# Patient Record
Sex: Female | Born: 1945 | Race: White | Hispanic: No | Marital: Married | Smoking: Never smoker
Health system: Southern US, Community
[De-identification: ages and names within clinical notes are randomized; demographics above are authoritative.]

## PROBLEM LIST (undated history)

## (undated) DIAGNOSIS — M1990 Unspecified osteoarthritis, unspecified site: Secondary | ICD-10-CM

## (undated) DIAGNOSIS — I10 Essential (primary) hypertension: Secondary | ICD-10-CM

## (undated) DIAGNOSIS — E039 Hypothyroidism, unspecified: Secondary | ICD-10-CM

## (undated) DIAGNOSIS — G4733 Obstructive sleep apnea (adult) (pediatric): Secondary | ICD-10-CM

## (undated) DIAGNOSIS — Z8669 Personal history of other diseases of the nervous system and sense organs: Secondary | ICD-10-CM

## (undated) DIAGNOSIS — J40 Bronchitis, not specified as acute or chronic: Secondary | ICD-10-CM

## (undated) DIAGNOSIS — R51 Headache: Secondary | ICD-10-CM

## (undated) DIAGNOSIS — I4891 Unspecified atrial fibrillation: Secondary | ICD-10-CM

## (undated) DIAGNOSIS — Z9889 Other specified postprocedural states: Secondary | ICD-10-CM

## (undated) DIAGNOSIS — I447 Left bundle-branch block, unspecified: Secondary | ICD-10-CM

## (undated) DIAGNOSIS — R112 Nausea with vomiting, unspecified: Secondary | ICD-10-CM

## (undated) HISTORY — PX: EYE SURGERY: SHX253

## (undated) HISTORY — DX: Obstructive sleep apnea (adult) (pediatric): G47.33

## (undated) HISTORY — PX: BACK SURGERY: SHX140

## (undated) HISTORY — PX: LUMBAR DISC SURGERY: SHX700

## (undated) HISTORY — PX: POSTERIOR FUSION LUMBAR SPINE: SUR632

## (undated) HISTORY — PX: CATARACT EXTRACTION, BILATERAL: SHX1313

## (undated) HISTORY — PX: APPENDECTOMY: SHX54

---

## 1999-04-10 ENCOUNTER — Other Ambulatory Visit: Admission: RE | Admit: 1999-04-10 | Discharge: 1999-04-10 | Payer: Self-pay | Admitting: Internal Medicine

## 1999-10-23 ENCOUNTER — Encounter: Admission: RE | Admit: 1999-10-23 | Discharge: 1999-10-23 | Payer: Self-pay | Admitting: Internal Medicine

## 1999-10-23 ENCOUNTER — Encounter: Payer: Self-pay | Admitting: Internal Medicine

## 2000-04-28 ENCOUNTER — Encounter: Payer: Self-pay | Admitting: Internal Medicine

## 2000-04-28 ENCOUNTER — Encounter: Admission: RE | Admit: 2000-04-28 | Discharge: 2000-04-28 | Payer: Self-pay | Admitting: Internal Medicine

## 2000-10-25 ENCOUNTER — Encounter: Admission: RE | Admit: 2000-10-25 | Discharge: 2000-10-25 | Payer: Self-pay | Admitting: Internal Medicine

## 2000-10-25 ENCOUNTER — Encounter: Payer: Self-pay | Admitting: Internal Medicine

## 2001-02-04 ENCOUNTER — Other Ambulatory Visit: Admission: RE | Admit: 2001-02-04 | Discharge: 2001-02-04 | Payer: Self-pay | Admitting: Gynecology

## 2001-02-04 ENCOUNTER — Encounter (INDEPENDENT_AMBULATORY_CARE_PROVIDER_SITE_OTHER): Payer: Self-pay

## 2001-03-28 HISTORY — PX: ABDOMINAL HYSTERECTOMY: SHX81

## 2001-04-20 ENCOUNTER — Encounter (INDEPENDENT_AMBULATORY_CARE_PROVIDER_SITE_OTHER): Payer: Self-pay | Admitting: Specialist

## 2001-04-20 ENCOUNTER — Inpatient Hospital Stay (HOSPITAL_COMMUNITY): Admission: RE | Admit: 2001-04-20 | Discharge: 2001-04-21 | Payer: Self-pay | Admitting: Gynecology

## 2001-10-27 ENCOUNTER — Encounter: Payer: Self-pay | Admitting: Internal Medicine

## 2001-10-27 ENCOUNTER — Encounter: Admission: RE | Admit: 2001-10-27 | Discharge: 2001-10-27 | Payer: Self-pay | Admitting: Internal Medicine

## 2002-05-25 ENCOUNTER — Other Ambulatory Visit: Admission: RE | Admit: 2002-05-25 | Discharge: 2002-05-25 | Payer: Self-pay | Admitting: Gynecology

## 2002-06-26 ENCOUNTER — Ambulatory Visit (HOSPITAL_COMMUNITY): Admission: RE | Admit: 2002-06-26 | Discharge: 2002-06-26 | Payer: Self-pay | Admitting: Gastroenterology

## 2003-05-24 ENCOUNTER — Other Ambulatory Visit: Admission: RE | Admit: 2003-05-24 | Discharge: 2003-05-24 | Payer: Self-pay | Admitting: Gynecology

## 2003-08-09 ENCOUNTER — Encounter: Payer: Self-pay | Admitting: Internal Medicine

## 2003-08-09 ENCOUNTER — Encounter: Admission: RE | Admit: 2003-08-09 | Discharge: 2003-08-09 | Payer: Self-pay | Admitting: Internal Medicine

## 2003-08-29 HISTORY — PX: CERVICAL FUSION: SHX112

## 2003-09-19 ENCOUNTER — Inpatient Hospital Stay (HOSPITAL_COMMUNITY): Admission: RE | Admit: 2003-09-19 | Discharge: 2003-09-21 | Payer: Self-pay | Admitting: Neurosurgery

## 2003-09-19 ENCOUNTER — Encounter: Payer: Self-pay | Admitting: Neurosurgery

## 2004-02-07 ENCOUNTER — Other Ambulatory Visit: Admission: RE | Admit: 2004-02-07 | Discharge: 2004-02-07 | Payer: Self-pay | Admitting: Gynecology

## 2004-07-30 ENCOUNTER — Inpatient Hospital Stay (HOSPITAL_COMMUNITY): Admission: RE | Admit: 2004-07-30 | Discharge: 2004-08-02 | Payer: Self-pay | Admitting: Neurosurgery

## 2004-09-04 ENCOUNTER — Encounter: Admission: RE | Admit: 2004-09-04 | Discharge: 2004-09-04 | Payer: Self-pay | Admitting: Internal Medicine

## 2004-10-29 ENCOUNTER — Encounter: Admission: RE | Admit: 2004-10-29 | Discharge: 2004-11-26 | Payer: Self-pay | Admitting: Neurosurgery

## 2005-02-02 ENCOUNTER — Other Ambulatory Visit: Admission: RE | Admit: 2005-02-02 | Discharge: 2005-02-02 | Payer: Self-pay | Admitting: Gynecology

## 2005-09-07 ENCOUNTER — Encounter: Admission: RE | Admit: 2005-09-07 | Discharge: 2005-09-07 | Payer: Self-pay | Admitting: Internal Medicine

## 2005-11-11 ENCOUNTER — Ambulatory Visit (HOSPITAL_COMMUNITY): Admission: RE | Admit: 2005-11-11 | Discharge: 2005-11-11 | Payer: Self-pay | Admitting: Neurosurgery

## 2006-01-01 ENCOUNTER — Inpatient Hospital Stay (HOSPITAL_COMMUNITY): Admission: RE | Admit: 2006-01-01 | Discharge: 2006-01-04 | Payer: Self-pay | Admitting: Neurosurgery

## 2006-02-11 ENCOUNTER — Other Ambulatory Visit: Admission: RE | Admit: 2006-02-11 | Discharge: 2006-02-11 | Payer: Self-pay | Admitting: Gynecology

## 2006-09-15 ENCOUNTER — Encounter: Admission: RE | Admit: 2006-09-15 | Discharge: 2006-09-15 | Payer: Self-pay | Admitting: Internal Medicine

## 2006-11-08 ENCOUNTER — Encounter: Admission: RE | Admit: 2006-11-08 | Discharge: 2006-11-08 | Payer: Self-pay | Admitting: Internal Medicine

## 2007-03-24 ENCOUNTER — Other Ambulatory Visit: Admission: RE | Admit: 2007-03-24 | Discharge: 2007-03-24 | Payer: Self-pay | Admitting: Gynecology

## 2007-12-13 ENCOUNTER — Encounter: Admission: RE | Admit: 2007-12-13 | Discharge: 2007-12-13 | Payer: Self-pay | Admitting: Internal Medicine

## 2008-05-10 ENCOUNTER — Other Ambulatory Visit: Admission: RE | Admit: 2008-05-10 | Discharge: 2008-05-10 | Payer: Self-pay | Admitting: Gynecology

## 2008-12-13 ENCOUNTER — Encounter: Admission: RE | Admit: 2008-12-13 | Discharge: 2008-12-13 | Payer: Self-pay | Admitting: Internal Medicine

## 2009-08-13 ENCOUNTER — Encounter: Admission: RE | Admit: 2009-08-13 | Discharge: 2009-08-13 | Payer: Self-pay | Admitting: Internal Medicine

## 2009-12-16 ENCOUNTER — Encounter: Admission: RE | Admit: 2009-12-16 | Discharge: 2009-12-16 | Payer: Self-pay | Admitting: Internal Medicine

## 2010-03-28 HISTORY — PX: CHOLECYSTECTOMY: SHX55

## 2010-04-01 ENCOUNTER — Inpatient Hospital Stay (HOSPITAL_COMMUNITY): Admission: EM | Admit: 2010-04-01 | Discharge: 2010-04-05 | Payer: Self-pay | Admitting: Emergency Medicine

## 2010-04-01 ENCOUNTER — Emergency Department (HOSPITAL_COMMUNITY): Admission: EM | Admit: 2010-04-01 | Discharge: 2010-04-01 | Payer: Self-pay | Admitting: Family Medicine

## 2010-04-04 ENCOUNTER — Encounter (INDEPENDENT_AMBULATORY_CARE_PROVIDER_SITE_OTHER): Payer: Self-pay | Admitting: Internal Medicine

## 2010-04-15 ENCOUNTER — Encounter: Admission: RE | Admit: 2010-04-15 | Discharge: 2010-04-15 | Payer: Self-pay | Admitting: Surgery

## 2010-05-07 ENCOUNTER — Ambulatory Visit (HOSPITAL_COMMUNITY): Admission: RE | Admit: 2010-05-07 | Discharge: 2010-05-07 | Payer: Self-pay | Admitting: Gastroenterology

## 2010-07-14 ENCOUNTER — Encounter: Admission: RE | Admit: 2010-07-14 | Discharge: 2010-07-14 | Payer: Self-pay | Admitting: Gastroenterology

## 2010-09-15 ENCOUNTER — Encounter: Admission: RE | Admit: 2010-09-15 | Discharge: 2010-09-15 | Payer: Self-pay | Admitting: Neurosurgery

## 2010-09-26 ENCOUNTER — Ambulatory Visit (HOSPITAL_COMMUNITY): Admission: RE | Admit: 2010-09-26 | Discharge: 2010-09-27 | Payer: Self-pay | Admitting: Neurosurgery

## 2010-10-28 HISTORY — PX: LUMBAR LAMINECTOMY/DECOMPRESSION MICRODISCECTOMY: SHX5026

## 2010-10-31 ENCOUNTER — Encounter: Admission: RE | Admit: 2010-10-31 | Discharge: 2010-10-31 | Payer: Self-pay | Admitting: Neurosurgery

## 2010-12-16 ENCOUNTER — Encounter
Admission: RE | Admit: 2010-12-16 | Discharge: 2010-12-16 | Payer: Self-pay | Source: Home / Self Care | Attending: Internal Medicine | Admitting: Internal Medicine

## 2011-01-19 ENCOUNTER — Encounter: Payer: Self-pay | Admitting: Neurosurgery

## 2011-03-11 LAB — GLUCOSE, CAPILLARY: Glucose-Capillary: 128 mg/dL — ABNORMAL HIGH (ref 70–99)

## 2011-03-12 LAB — BASIC METABOLIC PANEL
CO2: 31 mEq/L (ref 19–32)
Calcium: 9.5 mg/dL (ref 8.4–10.5)
Creatinine, Ser: 0.83 mg/dL (ref 0.4–1.2)
Glucose, Bld: 104 mg/dL — ABNORMAL HIGH (ref 70–99)

## 2011-03-12 LAB — GLUCOSE, CAPILLARY: Glucose-Capillary: 99 mg/dL (ref 70–99)

## 2011-03-12 LAB — URINALYSIS, ROUTINE W REFLEX MICROSCOPIC
Glucose, UA: NEGATIVE mg/dL
Ketones, ur: NEGATIVE mg/dL
Protein, ur: NEGATIVE mg/dL

## 2011-03-12 LAB — CBC
Hemoglobin: 13.6 g/dL (ref 12.0–15.0)
MCH: 29.2 pg (ref 26.0–34.0)
MCHC: 33.9 g/dL (ref 30.0–36.0)
Platelets: 205 10*3/uL (ref 150–400)

## 2011-03-12 LAB — URINE MICROSCOPIC-ADD ON

## 2011-03-18 LAB — URINALYSIS, ROUTINE W REFLEX MICROSCOPIC
Bilirubin Urine: NEGATIVE
Hgb urine dipstick: NEGATIVE
Ketones, ur: 15 mg/dL — AB
Protein, ur: 30 mg/dL — AB
Urobilinogen, UA: 1 mg/dL (ref 0.0–1.0)

## 2011-03-18 LAB — COMPREHENSIVE METABOLIC PANEL
ALT: 189 U/L — ABNORMAL HIGH (ref 0–35)
ALT: 282 U/L — ABNORMAL HIGH (ref 0–35)
ALT: 56 U/L — ABNORMAL HIGH (ref 0–35)
ALT: 98 U/L — ABNORMAL HIGH (ref 0–35)
AST: 31 U/L (ref 0–37)
AST: 37 U/L (ref 0–37)
AST: 79 U/L — ABNORMAL HIGH (ref 0–37)
Albumin: 2.5 g/dL — ABNORMAL LOW (ref 3.5–5.2)
Albumin: 2.5 g/dL — ABNORMAL LOW (ref 3.5–5.2)
Albumin: 2.7 g/dL — ABNORMAL LOW (ref 3.5–5.2)
Albumin: 3 g/dL — ABNORMAL LOW (ref 3.5–5.2)
Alkaline Phosphatase: 66 U/L (ref 39–117)
Alkaline Phosphatase: 68 U/L (ref 39–117)
BUN: 10 mg/dL (ref 6–23)
BUN: 11 mg/dL (ref 6–23)
CO2: 25 mEq/L (ref 19–32)
CO2: 25 mEq/L (ref 19–32)
CO2: 25 mEq/L (ref 19–32)
CO2: 29 mEq/L (ref 19–32)
Calcium: 8.2 mg/dL — ABNORMAL LOW (ref 8.4–10.5)
Calcium: 8.6 mg/dL (ref 8.4–10.5)
Chloride: 104 mEq/L (ref 96–112)
Chloride: 96 mEq/L (ref 96–112)
Chloride: 98 mEq/L (ref 96–112)
Chloride: 99 mEq/L (ref 96–112)
Creatinine, Ser: 0.8 mg/dL (ref 0.4–1.2)
Creatinine, Ser: 0.88 mg/dL (ref 0.4–1.2)
Creatinine, Ser: 0.89 mg/dL (ref 0.4–1.2)
Creatinine, Ser: 0.98 mg/dL (ref 0.4–1.2)
Creatinine, Ser: 0.98 mg/dL (ref 0.4–1.2)
GFR calc Af Amer: >60 mL/min (ref >60)
GFR calc Af Amer: >60 mL/min (ref >60)
GFR calc non Af Amer: 57 mL/min — ABNORMAL LOW (ref >60)
GFR calc non Af Amer: 57 mL/min — ABNORMAL LOW (ref >60)
GFR calc non Af Amer: >60 mL/min (ref >60)
Glucose, Bld: 154 mg/dL — ABNORMAL HIGH (ref 70–99)
Glucose, Bld: 332 mg/dL — ABNORMAL HIGH (ref 70–99)
Potassium: 3.5 mEq/L (ref 3.5–5.1)
Potassium: 3.9 mEq/L (ref 3.5–5.1)
Sodium: 133 mEq/L — ABNORMAL LOW (ref 135–145)
Sodium: 133 mEq/L — ABNORMAL LOW (ref 135–145)
Sodium: 136 mEq/L (ref 135–145)
Total Bilirubin: 1.6 mg/dL — ABNORMAL HIGH (ref 0.3–1.2)
Total Bilirubin: 1.6 mg/dL — ABNORMAL HIGH (ref 0.3–1.2)
Total Bilirubin: 1.6 mg/dL — ABNORMAL HIGH (ref 0.3–1.2)
Total Bilirubin: 2 mg/dL — ABNORMAL HIGH (ref 0.3–1.2)
Total Protein: 5.8 g/dL — ABNORMAL LOW (ref 6.0–8.3)
Total Protein: 6.1 g/dL (ref 6.0–8.3)

## 2011-03-18 LAB — CBC
HCT: 32.3 % — ABNORMAL LOW (ref 36.0–46.0)
HCT: 35.6 % — ABNORMAL LOW (ref 36.0–46.0)
Hemoglobin: 14.9 g/dL (ref 12.0–15.0)
MCHC: 33.9 g/dL (ref 30.0–36.0)
MCHC: 34 g/dL (ref 30.0–36.0)
MCHC: 34.3 g/dL (ref 30.0–36.0)
MCV: 90.5 fL (ref 78.0–100.0)
MCV: 91.4 fL (ref 78.0–100.0)
MCV: 92.4 fL (ref 78.0–100.0)
Platelets: 185 10*3/uL (ref 150–400)
Platelets: 192 10*3/uL (ref 150–400)
Platelets: 218 10*3/uL (ref 150–400)
RBC: 4.23 MIL/uL (ref 3.87–5.11)
RBC: 4.8 MIL/uL (ref 3.87–5.11)
RDW: 13.4 % (ref 11.5–15.5)
RDW: 13.5 % (ref 11.5–15.5)
WBC: 12.4 10*3/uL — ABNORMAL HIGH (ref 4.0–10.5)
WBC: 15.7 10*3/uL — ABNORMAL HIGH (ref 4.0–10.5)

## 2011-03-18 LAB — DIFFERENTIAL
Basophils Absolute: 0 10*3/uL (ref 0.0–0.1)
Eosinophils Absolute: 0 10*3/uL (ref 0.0–0.7)
Lymphocytes Relative: 4 % — ABNORMAL LOW (ref 12–46)
Lymphs Abs: 0.6 10*3/uL — ABNORMAL LOW (ref 0.7–4.0)
Neutrophils Relative %: 93 % — ABNORMAL HIGH (ref 43–77)

## 2011-03-18 LAB — CULTURE, BLOOD (ROUTINE X 2)
Culture: NO GROWTH
Culture: NO GROWTH

## 2011-03-18 LAB — CARDIAC PANEL(CRET KIN+CKTOT+MB+TROPI)
CK, MB: 1.1 ng/mL (ref 0.3–4.0)
Troponin I: 0.01 ng/mL (ref 0.00–0.06)
Troponin I: 0.02 ng/mL (ref 0.00–0.06)

## 2011-03-18 LAB — POCT I-STAT, CHEM 8
Calcium, Ion: 1.12 mmol/L (ref 1.12–1.32)
Glucose, Bld: 381 mg/dL — ABNORMAL HIGH (ref 70–99)
HCT: 52 % — ABNORMAL HIGH (ref 36.0–46.0)
Hemoglobin: 17.7 g/dL — ABNORMAL HIGH (ref 12.0–15.0)
Potassium: 3.2 mEq/L — ABNORMAL LOW (ref 3.5–5.1)
TCO2: 26 mmol/L (ref 0–100)

## 2011-03-18 LAB — LIPID PANEL
HDL: 36 mg/dL — ABNORMAL LOW (ref >39)
Triglycerides: 99 mg/dL (ref <150)
VLDL: 20 mg/dL (ref 0–40)

## 2011-03-18 LAB — GLUCOSE, CAPILLARY
Glucose-Capillary: 101 mg/dL — ABNORMAL HIGH (ref 70–99)
Glucose-Capillary: 104 mg/dL — ABNORMAL HIGH (ref 70–99)
Glucose-Capillary: 122 mg/dL — ABNORMAL HIGH (ref 70–99)
Glucose-Capillary: 136 mg/dL — ABNORMAL HIGH (ref 70–99)
Glucose-Capillary: 153 mg/dL — ABNORMAL HIGH (ref 70–99)
Glucose-Capillary: 154 mg/dL — ABNORMAL HIGH (ref 70–99)
Glucose-Capillary: 156 mg/dL — ABNORMAL HIGH (ref 70–99)
Glucose-Capillary: 163 mg/dL — ABNORMAL HIGH (ref 70–99)
Glucose-Capillary: 166 mg/dL — ABNORMAL HIGH (ref 70–99)
Glucose-Capillary: 196 mg/dL — ABNORMAL HIGH (ref 70–99)
Glucose-Capillary: 208 mg/dL — ABNORMAL HIGH (ref 70–99)
Glucose-Capillary: 219 mg/dL — ABNORMAL HIGH (ref 70–99)

## 2011-03-18 LAB — CK TOTAL AND CKMB (NOT AT ARMC)
CK, MB: 1 ng/mL (ref 0.3–4.0)
Relative Index: INVALID (ref 0.0–2.5)
Relative Index: INVALID (ref 0.0–2.5)
Total CK: 20 U/L (ref 7–177)
Total CK: 27 U/L (ref 7–177)

## 2011-03-18 LAB — TROPONIN I
Troponin I: 0.02 ng/mL (ref 0.00–0.06)
Troponin I: 0.02 ng/mL (ref 0.00–0.06)

## 2011-03-18 LAB — PHOSPHORUS: Phosphorus: 2.7 mg/dL (ref 2.3–4.6)

## 2011-03-18 LAB — LIPASE, BLOOD: Lipase: 1028 U/L — ABNORMAL HIGH (ref 11–59)

## 2011-03-18 LAB — MAGNESIUM: Magnesium: 1.5 mg/dL (ref 1.5–2.5)

## 2011-03-18 LAB — TSH: TSH: 2.326 u[IU]/mL (ref 0.350–4.500)

## 2011-05-14 ENCOUNTER — Other Ambulatory Visit (HOSPITAL_COMMUNITY)
Admission: RE | Admit: 2011-05-14 | Discharge: 2011-05-14 | Disposition: A | Discharge status: Home / Self Care | Payer: Medicare Other | Source: Ambulatory Visit | Attending: Internal Medicine | Admitting: Internal Medicine

## 2011-05-14 DIAGNOSIS — Z124 Encounter for screening for malignant neoplasm of cervix: Secondary | ICD-10-CM | POA: Insufficient documentation

## 2011-05-15 NOTE — Op Note (Signed)
Destiny [name], Destiny [name]                        ACCOUNT NO.:  [id number]   MEDICAL RECORD NO.:  [id number]                   PATIENT TYPE:  INP   LOCATION:  2874                                 FACILITY:  [facility]   PHYSICIAN:  Coletta Memos, M.D.                  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   PROCEDURE:  1. Posterolateral arthrodesis at L4-5.  2. Pedicle screw fixation non-segmental at L4-L5 with four 45 x 6.5 mm     Legacy screws.  3. Posterior lumbar interbody arthrodesis.  4. Interbody cages by two 11 mm Synthes Peak cages.  5. Autologous autograft with Infuse for posterolateral arthrodesis.   COMPLICATIONS:  None.   SURGEON:  Coletta Memos, M.D.   ASSISTANT:  Hewitt Shorts, M.D.   INDICATIONS FOR PROCEDURE:  This is a patient of mine who started to have  back pain in the fall and early winter.  The pain was not so great that she  wanted to do anything about it, so we watched it conservatively.  The pain,  however, progressed in a steady but slow fashion to the point that she was  having unbearable pain. An MRI showed severe arthropathy present at the L4-5  joint leading to ligamentous hypertrophy, lumbar stenosis and foraminal  stenosis bilaterally and spondylolisthesis.  Secondary to the  spondylolisthesis and a severe facet arthropathy, I did not believe a simple  decompression would leave her stable, so I therefore also told her that she  would decompression in addition to the posterolateral devices and interbody  devices.   DESCRIPTION OF PROCEDURE:   The patient was brought to the operating room,  intubated and placed under a general anesthetic without difficulty.  She was  rolled prone onto body rolls and all pressure pointes were properly padded.  A Foley catheter had been placed under sterile conditions prior to final  positioning.  The patient's back was prepped and she was draped in a sterile  fashion. I infiltrated 40 mL of 1/2% lidocaine at 1:200,000 strength  epinephrine into the paraspinous musculature and the region of the incision  in the lumbar area.  Using a preoperative localizing film, I opened the skin  incision and exposed the lamina of L3, L4 and L5 along with the sacrum.  I  took two more x-rays to confirm my location at the L4-5 level.  I then did a  complete laminectomy of L4 and a diskectomy at L4-5 bilaterally.  I then  completed facetectomies of the inferior facet  of L4 and partial superior  facetectomies of L5 in order to make sure that the neural foramen was open  bilaterally.  When that was done, I then placed two cages after preparing  the interbody spaces at L4-5. The 11 mm cages were placed without  difficulty.  Then bringing in fluoroscopy, I placed pedicle screws with two  at L4, two at L5 with fluoroscopic guidance first by drilling a small entry  site using a pedicle probe and then a tap.  AP and lateral x-rays showed the  screws to be in good position.  I then used the morselized autograft,  wrapped and Infuse and I laid that over the decorticated  transverse processes of L4 and L5 bilaterally.  I then placed rods and  secured those into position.  I then placed more bone that was left over  over the Infuse pads. I then closed the wound in layered fashion using  Vicryl sutures.  Dermabond was used for a sterile dressing.  The patient  tolerated the procedure well.                                               Coletta Memos, M.D.    KC/MEDQ  D:  07/30/2004  T:  07/30/2004  Job:  [id number]

## 2011-05-15 NOTE — Discharge Summary (Signed)
NAMEMARYHELEN, Destiny [name]                        ACCOUNT NO.:  [id number]   MEDICAL RECORD NO.:  [id number]                   PATIENT TYPE:  INP   LOCATION:  3015                                 FACILITY:  [facility]   PHYSICIAN:  Coletta Memos, M.D.                  DATE OF BIRTH:  August 18, 1946   DATE OF ADMISSION:  07/30/2004  DATE OF DISCHARGE:  08/02/2004                                 DISCHARGE SUMMARY   ADMITTING DIAGNOSES:  Lumbar spondylosis L4-5 and lumbar spondylolisthesis  L4-5 secondary to facet arthropathy, lumbar stenosis L4-5 and lumbar  radiculopathy.   POSTOPERATIVE DIAGNOSES:  Lumbar spondylosis L4-5 and lumbar  spondylolisthesis L4-5 secondary to facet arthropathy, lumbar stenosis L4-5  and lumbar radiculopathy.   PROCEDURE:  Posterolateral arthrodesis at L4-5 with pedicle screw fixation  and posterolateral interbody fusion using Synthes PEEK cages.   COMPLICATIONS:  None.   DISCHARGE STATUS:  Alive and well.   INDICATIONS:  [name] is a 65 year old patient of mine whom I have  taken care of in the past for cervical disease.  She developed back pain  approximately nine months ago, but was able to hold off secondary to the  pain not being so severe.  However, an MRI that we performed after her pain  worsened showed severe facet arthropathy, spondylolisthesis along with  spinal stenosis at L4-5.  Both nerve roots appeared to be encroached upon  within the neuroforamen.  She therefore opted to undergo surgical  decompression, for which she was admitted on July 30, 2004.  She had her  procedure without any untoward events.  At the time of discharge her wound  is clean and dry.  There is a slight bit of bloody drainage from the  superior most portion of the wound, but that has subsided significantly  since the operation.  Her strength is full in the lower extremities.  Her  gait is normal.  She has had both PT and OT see her during her time in the  hospital.  She  was given prescriptions for Percocet 7.5/325 and for  Flexeril.  She knows to contact the office for a return appointment.  She  also knows not to drive for at least 10 days and to wear a brace when out of  bed.                                                Coletta Memos, M.D.    KC/MEDQ  D:  08/02/2004  T:  08/03/2004  Job:  [id number]

## 2011-05-15 NOTE — Op Note (Signed)
NAMEMARCIANA, [name] NO.:  [id number]   MEDICAL RECORD NO.:  [id number]          PATIENT TYPE:  INP   LOCATION:  3008                         FACILITY:  [facility]   PHYSICIAN:  Coletta Memos, M.D.     DATE OF BIRTH:  07/12/1946   DATE OF PROCEDURE:  01/01/2006  DATE OF DISCHARGE:                                 OPERATIVE REPORT   PREOPERATIVE DIAGNOSES:  1.  Lumbar spondylosis, L3-4.  2.  Facet arthropathy, L3-4.  3.  Lumbar stenosis L3-4.  4.  Instability, L3-4.   POSTOPERATIVE DIAGNOSES:  1.  Lumbar spondylosis, L3-4.  2.  Facet arthropathy, L3-4.  3.  Lumbar stenosis L3-4.  4.  Instability, L3-4.   PROCEDURE:  1.  Posterior lumbar interbody fusion with 13-mm Synthes PEEK cages packed      with morselized autograft, L3-4.  2.  Posterolateral arthrodesis using INFUSE and morselized autograft, L3-4.   COMPLICATIONS:  None.   SURGEON:  Coletta Memos, M.D.   ANESTHESIA:  General.   ASSISTANT:  Hewitt Shorts, M.D.   INDICATIONS:  Destiny [name] is a 65 year old woman who presents today  for evaluation of lumbar stenosis.  She underwent a posterior lumbar  interbody fusion at L4-5 and the posterolateral arthrodesis at L4-5.  I  recommended, after seeing her scan this year, that we go ahead and fuse L3-4  after decompressing it because the facet was quite arthropathic and  certainly looked unstable.  Given that, she was admitted and taken to the  operating room today.   OPERATIVE NOTE:  Destiny [name] was brought to the operating room, intubated  and placed under a general anesthetic without difficulty.  She was rolled  prone onto a Jackson table and all pressure points properly padded.  Her  back was prepped and she was draped in a sterile fashion.  I infiltrated 39  mL of 0.5% lidocaine with 1:200,000 epinephrine into the lumbar region.  I  opened the skin with a #10 blade using my old incision and extending it up  superiorly approximately a  centimeter to centimeter and a half.  I then  exposed the lamina of what turned out to be L2-L3 and the screws at L4 and  5, and placing self-retaining retractors in, I removed the rods from L4 and  L5 bilaterally.  This was done first by removing the caps then the rods.  I  then turned my attention to decompression of the L3-4 space.   I did a full decompression of the L3-4 space using a high-speed drill and  Kerrison punches to do a laminectomy of L3 and residual lamina of L4.  I  removed this in a piecemeal fashion until I had gotten down to the  ligamentum flavum.  The ligamentum flavum was quite redundant and partially  calcified on the left side.  I was able remove that without any difficulty.  I decompress the L3 and L4 nerve roots bilaterally.  I then completed  diskectomies bilaterally of L3-4.  I then sized the space and placed 13-mm  PEEK cages into the space, pack  with morselized autograft and this was done  without difficulty.   I then laid bone along with INFUSE and I laid this out into the facets and  posterolaterally between L3 and L4.  Dr. Newell Coral assisted with the  posterolateral arthrodesis and with the decompression.  I then inspected the  nerve roots and felt that they were well-decompressed.  I then closed after  irrigating in a layered fashion using Vicryl sutures.  Dermabond was used  for a sterile dressing.  The patient tolerated the procedure well.  The  patient had a Foley catheter placed under sterile conditions without  difficulty just after being intubated.           ______________________________  Coletta Memos, M.D.     KC/MEDQ  D:  01/01/2006  T:  01/02/2006  Job:  [id number]

## 2011-05-15 NOTE — Discharge Summary (Signed)
Destiny [name], Destiny [name] NO.:  [id number]   MEDICAL RECORD NO.:  [id number]         PATIENT TYPE:  INP   LOCATION:  3008                         FACILITY:  [facility]   PHYSICIAN:  Coletta Memos, M.D.     DATE OF BIRTH:  09-09-1946   DATE OF ADMISSION:  01/01/2006  DATE OF DISCHARGE:  01/04/2006                                 DISCHARGE SUMMARY   ADMISSION DIAGNOSES:  1.  Lumbar stenosis L3-L4.  2.  Lumbar instability L3-L4.  3.  Lumbar spondylosis L3-L4.  4.  Lumbar radiculopathy L3-L4.   DISCHARGE DIAGNOSES:  1.  Lumbar stenosis L3-L4.  2.  Lumbar instability L3-L4.  3.  Lumbar spondylosis L3-L4.  4.  Lumbar radiculopathy L3-L4.   PROCEDURE:  1.  Posterior lumbar vertebral body fusion L3-L4 with 13 mm __________      packed with morphologic __________ .  2.  Posterior lateral arthrodesis L3-L4 with Infuse.   COMPLICATIONS:  None.   DISCHARGE STATUS:  Alive and well.   DISCHARGE MEDICATIONS:  Darvocet N-100 one p.o. q.6 p.r.n. pain and  Flexeril 1 p.o. t.i.d. p.r.n. muscle spasms.   DISCHARGE DESTINATION:  Home.   STATUS:  Alive and well.   Neurologically, normal strength in the lower extremities. Wound is clean,  dry and without signs of infection.   HISTORY OF PRESENT ILLNESS:  Destiny [name] is a long term patient of mine.  She underwent an L4-L5 fusion in the past and did quite well. She had a  solid fusion at that level. That was done with pedicle screws. She started  to have pain again in her right hip and right lower extremity. MRI was  performed and showed some changes present at L3-L4, but secondary to the  artifact noted as the hardware, I elected to have her undergo a myelogram.  What that showed was pretty severe stenosis and a near block at the L3-L4  level, significant calcification of the ligamentum flavum and facet  arthropathy at L3-L4. She had a solid fusion at L4-L5.   HOSPITAL COURSE:  Therefore, we took her to the operating room,  where I  removed the hardware at L4 and L5 and simply did a posterior lumbar and body  fusion and a posterior lateral arthrodesis using Infuse. She did so well  with the Infuse on her last case that I did not feel that the pedicle screws  would help and frankly I wanted to avoid damaging the facet at L2-L3. I  think that the facet arthropathy at L3-L4 helped to hasten the healthy  nature of that disc space. She will be wearing a corset and I explained this  to the patient and her family and her husband.   She will have a return appointment to see me in approximately 3 to 4 weeks.           ______________________________  Coletta Memos, M.D.     KC/MEDQ  D:  01/04/2006  T:  01/04/2006  Job:  [id number]

## 2011-05-15 NOTE — Op Note (Signed)
NAME:  Destiny [name], Destiny [name]                        ACCOUNT NO.:  [id number]   MEDICAL RECORD NO.:  [id number]                   PATIENT TYPE:  INP   LOCATION:  2872                                 FACILITY:  [facility]   PHYSICIAN:  Coletta Memos, M.D.                  DATE OF BIRTH:  November 13, 1946   DATE OF PROCEDURE:  09/19/2003  DATE OF DISCHARGE:                                 OPERATIVE REPORT   PREOPERATIVE DIAGNOSIS:  1. Cervical spondylosis, C3-4, C4-5 and C5-6 without myelopathy.  2. Cervical stenosis, C3 to C6.  3. Cervical radiculopathy.   POSTOPERATIVE DIAGNOSIS:  1. Cervical spondylosis, C3-4, C4-5 and C5-6 without myelopathy.  2. Cervical stenosis, C3 to C6.  3. Cervical radiculopathy.   OPERATION PERFORMED:  Anterior cervical decompression, C3-4, C4-5, C5-6.  Arthrodesis C3 to C6.  Anterior plating C3 to C6.  Synthes small stature 51  mm plate and three allografts for arthrodesis 7 mm at C4-5, C3-4 and C5-6.   SURGEON:  Coletta Memos, M.D.   ASSISTANT:  Payton Doughty, M.D.   ANESTHESIA:  General endotracheal.   COMPLICATIONS:  None.   INDICATIONS FOR PROCEDURE:  Mrs. Madole presented with cervical  radiculopathy and severe neck pain.  She had evidence of cervical  spondylosis with myelopathy, a herniated disk at C5-6 on the right side and  severe foraminal stenosis secondary to spondylitic change present at C3-4,  4-5 and 5-6.  I therefore recommended and she agreed to under go  decompression at those levels.   DESCRIPTION OF PROCEDURE:  Ms. Tigges was brought to the operating room,  intubated, and placed under general anesthetic without difficulty.  A Foley  catheter was placed under sterile conditions.  She was then positioned with  her head in slight extension with 10 pounds of traction applied via chin  strap on the horseshoe head rest.  Her neck was prepped and she was draped  in a sterile fashion.  I infiltrated approximately 4mL of 0.5% lidocaine  1:200,000  strength epinephrine just medial to the medial border of the  sternocleidomastoid on the left side of the neck.  I made my skin incision  and I took this with a #10 blade down to the level of the platysma.  Then  using scissors, I opened the platysma and created an avascular plane between  the medial strap muscles and the sternocleidomastoid muscle.  I then was  able to expose the anterior cervical spine.  I placed the spinal needle into  the disk space that was in the middle of my exposure and that was C4-5.  So  using that as a guide, I then removed disk at C3-4, 4-5 and 5-6.  I placed  distraction pins at C4 and one at C5.  I opened that disk space, brought in  a microscope and completed a diskectomy using high speed drill, curets and  Kerrison punches.  I decompressed the C5 nerve roots bilaterally and then  placed a 7 mm lordotic allograft supplied by Synthes packed with both DBX  and morselized allograft.  I then turned my attention to C3-4, I placed a  distraction pin at C3.  I opened a disk space and again using pituitary  rongeur, high speed drill and a curet, opened up the spinal canal,  decompressed the spinal cord.  I was not as aggressive decompressing the C4  roots but I did open the neural foramina bilaterally.  I felt that there was  no obvious compression and placed another 7 mm piece of bone packed with DBX  putty and morselized allograft.  I then finally did the C5-6 space, placed a  distraction pin in C6, opened the disk space and again in a progressive  fashion, removed the disk and osteophytes using a high speed air drill,  Kerrison punches and pituitary rongeurs.  I placed a 7 mm nonlordotic  Synthes bone tap with DBX and morselized allograft.  Then with Dr. Temple Pacini  assistance, I placed the plate using one screw at C3, two at C4, two at C5  and two at C6.  12 mm screws placed on the left side at C4 and C5.  No screw  placed at the top left of C3 as it did not achieve  good purchase.  I then  took an x-ray that showed the plate in good position.  I then irrigated the  wound.  I then closed the wound in layered fashion using Vicryl sutures.  Dermabond used for sterile dressing.                                                Coletta Memos, M.D.    KC/MEDQ  D:  09/19/2003  T:  09/20/2003  Job:  [id number]

## 2011-05-15 NOTE — Op Note (Signed)
[facility]  Patient:    Destiny [name], Destiny [name] [facility]                   MRN: [id number] Proc. Date: 04/20/01 Adm. Date:  [id number] Attending:  Rolinda Roan CC:         Erskine Speed, M.D.   Operative Report  PREOPERATIVE DIAGNOSES:  Postmenopausal bleeding submucous leiomyoma.  POSTOPERATIVE DIAGNOSES:  Postmenopausal bleeding submucous leiomyoma.  OPERATION PERFORMED:  Supracervical hysterectomy and bilateral salpingo-oophorectomy.  SURGEON:  Dr. Teodora Medici.  ASSISTANT:  Dr. Rosalee Kaufman.  ANESTHESIA:  General endotracheal.  PREPARATION:  Betadine.  DESCRIPTION OF PROCEDURE:  With the patient in the supine position, she was prepped and draped in the routine fashion. The patient had a previous ruptured appendix and had a transverse incision on the right side of the pelvis in the approximate location of a Pfannenstiel type incision. A Pfannenstiel incision was made utilizing part of that incision. There was significant subcutaneous scarring. The fascia was opened and although the fascia appeared to be very weak and thin on the right side, the patient did not have a hernia. The peritoneal cavity was entered atraumatically and the peritoneum opened. Brief exploration of her upper abdomen was benign. Exploration of the pelvis revealed the uterus to be top normal in size. The right tube and ovary were normal. The left tube and ovary were moderately adherent to the pelvic side wall. The round ligaments were suture ligated with #1 chromic and divided with cautery. The anterior leaf of the broad ligament was then opened. There was significant scarring in the broad ligament on the right side. The infundibulopelvic ligament on the right was isolated, clamped, cut and free tied with #1 chromic and then suture ligated with #1 chromic. Because of the scarring of the ovary on the left, the utero-ovarian ligament was clamped, cut and free tied with #1  chromic to be removed later in the procedure when exposure was better. The bladder was taken down sharply and bluntly and the uterine arteries clamped, cut and suture ligated with #1 chromic. The cardinal ligaments were partially taken down bilaterally, clamped, cut and suture ligated with #1 chromic. Given scarring in the skin which eliminated exposure although the incision was generous in size and the amount of intraperitoneal fat, the exposure was extremely limited and difficult and the decision was made to proceed with a supracervical hysterectomy. The uterus was amputated, the endocervical canal cauterized. Angled figure-of-eight sutures of #1 chromic were placed and the cervical stump was covered over with interrupted figure-of-eight #1 chromic sutures. Attention was then directed to the left ovary which was dissected free of the pelvic side wall, the ureter identified, the infundibulopelvic ligament isolated, clamped, cut and free tied with #1 chromic and then suture ligated with #1 chromic. The pelvis was irrigated with copious amounts of warm lactated Ringers solution and hemostasis noted to be intact. At the completion of the procedure, an effort was made to place the large bowel in the cul-de-sac, the omentum was brought down and the abdomen was closed in layers using a running 2-0 Vicryl on the peritoneum, running #0 Vicryl at the midline bilaterally on the fascia. Hemostasis was assured in the subcutaneous tissue and the skin was closed with staples. The area over the incision that had been depressed previously was elevated by undermining the subcutaneous tissue and a great effort was made to revert the skin edges although there was still a significant retraction. The estimated  blood loss was approximately 100 cc. The sponge, needle and instrument counts were correct x 2. The patient tolerated the procedure well and was taken to the recovery room in satisfactory condition. DD:   04/20/01 TD:  04/20/01 Job: [id number]

## 2011-05-15 NOTE — H&P (Signed)
[facility]  Patient:    Destiny [name], Destiny [name] [facility]                   MRN: [id number] Adm. Date:  [id number] Attending:  Rolinda Roan CC:         Erskine Speed, M.D.   History and Physical  ADMISSION DIAGNOSIS:  Postmenopausal bleeding and submucous leiomyoma.  HISTORY OF PRESENT ILLNESS:  The patient is a 65 year old gravida 2, para 2 female who has "never become menopausal."  She has frequent intermittent bleeding which is heavy at times.  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 There was a small polyp in the left anterior wall.  The pathology report revealed benign endometrial polyp mixed with disorder proliferative endometrium.  Given the size, location, and vascularity of the fibroid, the patient was advised to consider a hysterectomy.  The patient had a ruptured appendix in 1964, for which she said she was hospitalized for an extended time and has an incision which is significantly depressed, suggestive of the possibility of a previous wound infection.  Given the high likelihood of bowel adhesions, the decision was made to proceed with an abdominal hysterectomy.  A total abdominal hysterectomy and bilateral salpingo-oophorectomy have been discussed with the patient in detail.  The ACOG booklet has been reviewed by the patient.  Potential complications including but not limited to anesthesia; injury to the bowel, bladder, or ureters; possible fistula formation; possible blood loss or transfusion and sequelae and possible infection in the wound or in the pelvis have been discussed with the patient in detail.  Postoperative restrictions and expectations have also been reviewed.  The patient has had a preoperative bowel prep  with GoLYTELY.  PAST SURGICAL HISTORY:  Appendectomy, tubal ligation.  PAST MEDICAL HISTORY:  Obesity.  MEDICATIONS:  None.  ALLERGIES:  None known.  SOCIAL HISTORY:  Smokes:  None.  ETOH:  None.  The patient is employed by [employer] and is married.  FAMILY HISTORY:  Noncontributory, as the patient was adopted.  PHYSICAL EXAMINATION:  HEENT:  Negative.  HEART:  Without murmurs.  LUNGS:  Clear.  BREASTS:  Without masses or discharge.  ABDOMEN:  Obese, soft, and nontender.  PELVIC:  BUS, vagina, and cervix normal.  The uterus and adnexa are not palpable.  RECTAL:  Negative.  EXTREMITIES:  Negative.  IMPRESSION:  Postmenopausal bleeding, submucous leiomyoma.  PLAN:  Total abdominal hysterectomy and bilateral salpingo-oophorectomy. DD:  04/20/01 TD:  04/20/01 Job: [id number]

## 2011-05-15 NOTE — Procedures (Signed)
[facility]  Patient:    Destiny [name], Destiny [name] Visit Number: [id number] MRN: [id number]          Service Type: END Location: ENDO Attending Physician:  Dennison Bulla Ii Dictated by:   Verlin Grills, M.D. Proc. Date: 06/26/02 Admit Date:  06/26/2002 Discharge Date: 06/26/2002   CC:         Erskine Speed, M.D.   Procedure Report  REFERRING PHYSICIAN:  Erskine Speed, M.D.  PROCEDURE:   Colonoscopy.  ENDOSCOPIST:  Verlin Grills, M.D.  INDICATION:  Ms. Destiny [name] is a 65 year old female born 10-30-1946.  Ms. Quintin has iron deficiency anemia and intermittent painless hematochezia.  I discussed with Ms. Moes the complications associated with diagnostic colonoscopy with polypectomy including intestinal bleeding and intestinal perforation.  Ms. Laux has signed the operative permit.  PREMEDICATION:  Versed 5 mg, Demerol 50 mg.  ENDOSCOPE:  Olympus video colonoscope.  DESCRIPTION OF PROCEDURE:  After obtaining informed consent, the patient was placed in the left lateral decubitus position.  I administered intravenous Demerol and intravenous Versed to achieve conscious sedation for the procedure.  The patients blood pressure and oxygen saturation and cardiac rhythm were monitored throughout the procedure and documented in the medical record.  Anal inspection was normal.  Digital rectal exam was normal.  The Olympus pediatric video colonoscope was introduced into the rectum and easily advanced to the cecum.  A normal appearing ileocecal valve was intubated and the distal ilium inspected.  Colonic preparation for the exam today was excellent. Rectum:  Normal.  Sigmoid colon and descending colon:  Normal.  Splenic flexure:  Normal.  Transverse colon:  Normal.  Hepatic flexure normal. Ascending colon:  Normal.  Cecum and ileocecal valve:  Normal.  Distal ileum normal.  ASSESSMENT:  Normal proctocolonoscopy to the  cecum.  Normal distal ilium by exam.  No endoscopic evidence for the presence of colorectal neoplasia.  RECOMMENDATIONS:  I suspect Ms. Westurns intermittent hematochezia is secondary to internal hemorrhoids.  No further gastrointestinal evaluation is warranted. Dictated by:   Verlin Grills, M.D. Attending Physician:  Dennison Bulla Ii DD:  06/26/02 TD:  06/27/02 Job: [id number]

## 2011-08-05 ENCOUNTER — Other Ambulatory Visit: Payer: Self-pay | Admitting: Neurosurgery

## 2011-08-05 DIAGNOSIS — M5416 Radiculopathy, lumbar region: Secondary | ICD-10-CM

## 2011-08-12 ENCOUNTER — Ambulatory Visit
Admission: RE | Admit: 2011-08-12 | Discharge: 2011-08-12 | Disposition: A | Discharge status: Home / Self Care | Payer: Medicare Other | Source: Ambulatory Visit | Attending: Neurosurgery | Admitting: Neurosurgery

## 2011-08-12 DIAGNOSIS — M5416 Radiculopathy, lumbar region: Secondary | ICD-10-CM

## 2011-08-12 MED ORDER — GADOBENATE DIMEGLUMINE 529 MG/ML IV SOLN
18.0000 mL | Freq: Once | INTRAVENOUS | Status: AC | PRN
  Administered 2011-08-12: 18 mL via INTRAVENOUS

## 2011-09-03 ENCOUNTER — Other Ambulatory Visit: Payer: Self-pay | Admitting: Gastroenterology

## 2011-09-04 ENCOUNTER — Ambulatory Visit
Admission: RE | Admit: 2011-09-04 | Discharge: 2011-09-04 | Disposition: A | Discharge status: Home / Self Care | Payer: Medicare Other | Source: Ambulatory Visit | Attending: Gastroenterology | Admitting: Gastroenterology

## 2011-09-04 MED ORDER — IOHEXOL 300 MG/ML  SOLN: 100.0000 mL | Freq: Once | INTRAMUSCULAR | Status: AC | PRN

## 2011-09-25 ENCOUNTER — Other Ambulatory Visit: Payer: Self-pay | Admitting: Gastroenterology

## 2011-11-02 ENCOUNTER — Emergency Department (INDEPENDENT_AMBULATORY_CARE_PROVIDER_SITE_OTHER)
Admission: EM | Admit: 2011-11-02 | Discharge: 2011-11-02 | Disposition: A | Discharge status: Home / Self Care | Payer: Medicare Other | Source: Home / Self Care | Attending: Family Medicine | Admitting: Family Medicine

## 2011-11-02 ENCOUNTER — Encounter: Payer: Self-pay | Admitting: *Deleted

## 2011-11-02 ENCOUNTER — Emergency Department (INDEPENDENT_AMBULATORY_CARE_PROVIDER_SITE_OTHER): Payer: Medicare Other

## 2011-11-02 DIAGNOSIS — J209 Acute bronchitis, unspecified: Secondary | ICD-10-CM

## 2011-11-02 HISTORY — DX: Hypothyroidism, unspecified: E03.9

## 2011-11-02 HISTORY — DX: Essential (primary) hypertension: I10

## 2011-11-02 MED ORDER — HYDROCOD POLST-CHLORPHEN POLST 10-8 MG/5ML PO LQCR: 5.0000 mL | Freq: Two times a day (BID) | ORAL | Status: DC | PRN

## 2011-11-02 MED ORDER — AZITHROMYCIN 250 MG PO TABS: 250.0000 mg | ORAL_TABLET | Freq: Every day | ORAL | Status: AC

## 2011-11-02 MED ORDER — ALBUTEROL SULFATE HFA 108 (90 BASE) MCG/ACT IN AERS: 2.0000 | INHALATION_SPRAY | RESPIRATORY_TRACT | Status: DC | PRN

## 2011-11-02 NOTE — ED Provider Notes (Signed)
History     CSN: [id number] Arrival date & time: 11/02/2011  9:28 AM   First MD Initiated Contact with Patient 11/02/11 1207      Chief Complaint  Patient presents with  . URI    onset yesterday fever of 100.8, sore throat, headache, and nonproductive cough    (Consider location/radiation/quality/duration/timing/severity/associated sxs/prior treatment) Patient is a 65 y.o. female presenting with URI. The history is provided by the patient.  URI The primary symptoms include fever, headaches, cough and wheezing. The current episode started yesterday. This is a new problem.  The maximum temperature recorded prior to her arrival was 100 to 100.9 F.  The cough began 2 days ago. The cough is new. The cough is non-productive and dry.  Symptoms associated with the illness include congestion. Risk factors for severe complications from URI include diabetes mellitus.    Past Medical History  Diagnosis Date  . Diabetes mellitus   . Hypothyroid   . Hypertension     Past Surgical History  Procedure Date  . Cholecystectomy   . Appendectomy   . Lumbar disc surgery     History reviewed. No pertinent family history.  History  Substance Use Topics  . Smoking status: Not on file  . Smokeless tobacco: Not on file  . Alcohol Use: No    OB History    Grav Para Term Preterm Abortions TAB SAB Ect Mult Living                  Review of Systems  Constitutional: Positive for fever.  HENT: Positive for congestion.   Respiratory: Positive for cough and wheezing.   Cardiovascular: Negative.   Gastrointestinal: Negative.   Genitourinary: Negative.   Neurological: Positive for headaches.    Allergies  Codeine  Home Medications   Current Outpatient Rx  Name Route Sig Dispense Refill  . ATENOLOL-CHLORTHALIDONE 50-25 MG PO TABS Oral Take 1 tablet by mouth daily.      Marland Kitchen GLIMEPIRIDE 4 MG PO TABS Oral Take 4 mg by mouth daily before breakfast.      . LEVOTHYROXINE SODIUM 125 MCG PO  TABS Oral Take 125 mcg by mouth daily.        BP 114/79  Pulse 78  Temp(Src) 98.2 F (36.8 C) (Oral)  Resp 20  SpO2 97%  Physical Exam  Constitutional: She appears well-developed and well-nourished.  HENT:  Head: Normocephalic and atraumatic.  Eyes: EOM are normal. Pupils are equal, round, and reactive to light.  Cardiovascular: Normal rate and regular rhythm.  Exam reveals no gallop and no friction rub.   No murmur heard. Pulmonary/Chest: Effort normal. She has no decreased breath sounds. She has wheezes in the right lower field and the left lower field. She has no rhonchi. She has no rales.    ED Course  Procedures (including critical care time)  Labs Reviewed - No data to display No results found.   No diagnosis found.    MDM  CXR: stable, no acute pulmonary disease. Will treat for bronchitis.        Richardo Priest, MD 11/02/11 1315

## 2011-11-23 ENCOUNTER — Other Ambulatory Visit: Payer: Self-pay | Admitting: Internal Medicine

## 2011-11-23 DIAGNOSIS — Z1231 Encounter for screening mammogram for malignant neoplasm of breast: Secondary | ICD-10-CM

## 2011-12-08 ENCOUNTER — Encounter (HOSPITAL_COMMUNITY): Payer: Self-pay | Admitting: Pharmacy Technician

## 2011-12-08 ENCOUNTER — Other Ambulatory Visit: Payer: Self-pay | Admitting: Neurosurgery

## 2011-12-11 ENCOUNTER — Encounter (HOSPITAL_COMMUNITY)
Admission: RE | Admit: 2011-12-11 | Discharge: 2011-12-11 | Disposition: A | Discharge status: Home / Self Care | Payer: Medicare Other | Source: Ambulatory Visit | Attending: Neurosurgery | Admitting: Neurosurgery

## 2011-12-11 ENCOUNTER — Other Ambulatory Visit: Payer: Self-pay

## 2011-12-11 ENCOUNTER — Encounter (HOSPITAL_COMMUNITY): Payer: Self-pay

## 2011-12-11 HISTORY — DX: Unspecified osteoarthritis, unspecified site: M19.90

## 2011-12-11 HISTORY — DX: Nausea with vomiting, unspecified: R11.2

## 2011-12-11 HISTORY — DX: Other specified postprocedural states: Z98.890

## 2011-12-11 LAB — BASIC METABOLIC PANEL
CO2: 31 mEq/L (ref 19–32)
Calcium: 9.7 mg/dL (ref 8.4–10.5)
Glucose, Bld: 107 mg/dL — ABNORMAL HIGH (ref 70–99)
Potassium: 3.2 mEq/L — ABNORMAL LOW (ref 3.5–5.1)
Sodium: 141 mEq/L (ref 135–145)

## 2011-12-11 LAB — SURGICAL PCR SCREEN
MRSA, PCR: NEGATIVE
Staphylococcus aureus: NEGATIVE

## 2011-12-11 LAB — CBC
Hemoglobin: 14.3 g/dL (ref 12.0–15.0)
MCH: 29.6 pg (ref 26.0–34.0)
Platelets: 204 10*3/uL (ref 150–400)
RBC: 4.83 MIL/uL (ref 3.87–5.11)
WBC: 4.4 10*3/uL (ref 4.0–10.5)

## 2011-12-11 LAB — TYPE AND SCREEN: ABO/RH(D): A POS

## 2011-12-11 NOTE — Pre-Procedure Instructions (Signed)
20 Destiny Brown  12/11/2011   Your procedure is scheduled on:  12/16/11  Report to [facility] at 925 AM.  Call this number if you have problems the morning of surgery: [phone]   Remember:   Do not eat food:After Midnight.  May have clear liquids: up to 4 Hours before arrival.  Clear liquids include soda, tea, black coffee, apple or grape juice, broth.  Take these medicines the morning of surgery with A SIP OF WATER: ATENOLOL,SYNTHROID   Do not wear jewelry, make-up or nail polish.  Do not wear lotions, powders, or perfumes. You may wear deodorant.  Do not shave 48 hours prior to surgery.  Do not bring valuables to the hospital.  Contacts, dentures or bridgework may not be worn into surgery.  Leave suitcase in the car. After surgery it may be brought to your room.  For patients admitted to the hospital, checkout time is 11:00 AM the day of discharge.   Patients discharged the day of surgery will not be allowed to drive home.  Name and phone number of your driver: FAMILY  Special Instructions: CHG Shower Use Special Wash: 1/2 bottle night before surgery and 1/2 bottle morning of surgery.   Please read over the following fact sheets that you were given: Blood Transfusion Information, MRSA Information and Surgical Site Infection Prevention

## 2011-12-15 MED ORDER — CEFAZOLIN SODIUM-DEXTROSE 2-3 GM-% IV SOLR
2.0000 g | INTRAVENOUS | Status: AC
  Administered 2011-12-16: 2 g via INTRAVENOUS
  Filled 2011-12-15: qty 50

## 2011-12-16 ENCOUNTER — Encounter (HOSPITAL_COMMUNITY): Payer: Self-pay | Admitting: Anesthesiology

## 2011-12-16 ENCOUNTER — Inpatient Hospital Stay (HOSPITAL_COMMUNITY)
Admission: RE | Admit: 2011-12-16 | Discharge: 2011-12-19 | DRG: 460 | Disposition: A | Discharge status: Home Health | Payer: Medicare Other | Source: Ambulatory Visit | Attending: Neurosurgery | Admitting: Neurosurgery

## 2011-12-16 ENCOUNTER — Inpatient Hospital Stay (HOSPITAL_COMMUNITY): Payer: Medicare Other

## 2011-12-16 ENCOUNTER — Encounter (HOSPITAL_COMMUNITY)
Admission: RE | Disposition: A | Discharge status: Home Health | Payer: Self-pay | Source: Ambulatory Visit | Attending: Neurosurgery

## 2011-12-16 ENCOUNTER — Inpatient Hospital Stay (HOSPITAL_COMMUNITY): Payer: Medicare Other | Admitting: Anesthesiology

## 2011-12-16 DIAGNOSIS — M47817 Spondylosis without myelopathy or radiculopathy, lumbosacral region: Secondary | ICD-10-CM | POA: Diagnosis present

## 2011-12-16 DIAGNOSIS — M5137 Other intervertebral disc degeneration, lumbosacral region: Principal | ICD-10-CM | POA: Diagnosis present

## 2011-12-16 DIAGNOSIS — M4310 Spondylolisthesis, site unspecified: Secondary | ICD-10-CM | POA: Diagnosis present

## 2011-12-16 DIAGNOSIS — M51379 Other intervertebral disc degeneration, lumbosacral region without mention of lumbar back pain or lower extremity pain: Principal | ICD-10-CM | POA: Diagnosis present

## 2011-12-16 DIAGNOSIS — M5116 Intervertebral disc disorders with radiculopathy, lumbar region: Secondary | ICD-10-CM | POA: Diagnosis present

## 2011-12-16 DIAGNOSIS — Z01812 Encounter for preprocedural laboratory examination: Secondary | ICD-10-CM

## 2011-12-16 HISTORY — DX: Personal history of other diseases of the nervous system and sense organs: Z86.69

## 2011-12-16 HISTORY — DX: Headache: R51

## 2011-12-16 HISTORY — DX: Bronchitis, not specified as acute or chronic: J40

## 2011-12-16 SURGERY — POSTERIOR LUMBAR FUSION 1 LEVEL
Anesthesia: General | Wound class: Clean

## 2011-12-16 MED ORDER — 0.9 % SODIUM CHLORIDE (POUR BTL) OPTIME
TOPICAL | Status: DC | PRN
  Administered 2011-12-16: 1000 mL

## 2011-12-16 MED ORDER — ONDANSETRON HCL 4 MG/2ML IJ SOLN: 4.0000 mg | Freq: Once | INTRAMUSCULAR | Status: DC | PRN

## 2011-12-16 MED ORDER — PHENOL 1.4 % MT LIQD: 1.0000 | OROMUCOSAL | Status: DC | PRN

## 2011-12-16 MED ORDER — VITAMIN D 1000 UNITS PO TABS
1000.0000 [IU] | ORAL_TABLET | Freq: Every day | ORAL | Status: DC
  Administered 2011-12-19: 1000 [IU] via ORAL
  Filled 2011-12-16 (×4): qty 1

## 2011-12-16 MED ORDER — ACETAMINOPHEN 10 MG/ML IV SOLN
INTRAVENOUS | Status: DC | PRN
  Administered 2011-12-16: 1000 mg via INTRAVENOUS

## 2011-12-16 MED ORDER — ACETAMINOPHEN 10 MG/ML IV SOLN
INTRAVENOUS | Status: AC
  Filled 2011-12-16: qty 100

## 2011-12-16 MED ORDER — HETASTARCH-ELECTROLYTES 6 % IV SOLN
INTRAVENOUS | Status: DC | PRN
  Administered 2011-12-16: 13:00:00 via INTRAVENOUS

## 2011-12-16 MED ORDER — THROMBIN 20000 UNITS EX KIT
PACK | CUTANEOUS | Status: DC | PRN
  Administered 2011-12-16: 20000 [IU] via TOPICAL

## 2011-12-16 MED ORDER — ALUM & MAG HYDROXIDE-SIMETH 200-200-20 MG/5ML PO SUSP: 30.0000 mL | Freq: Four times a day (QID) | ORAL | Status: DC | PRN

## 2011-12-16 MED ORDER — POTASSIUM CHLORIDE IN NACL 20-0.9 MEQ/L-% IV SOLN
INTRAVENOUS | Status: DC
  Administered 2011-12-16 – 2011-12-18 (×4): via INTRAVENOUS
  Filled 2011-12-16 (×7): qty 1000

## 2011-12-16 MED ORDER — SCOPOLAMINE 1 MG/3DAYS TD PT72
MEDICATED_PATCH | TRANSDERMAL | Status: DC | PRN
  Administered 2011-12-16: 1.5 mg via TRANSDERMAL

## 2011-12-16 MED ORDER — LACTATED RINGERS IV SOLN
INTRAVENOUS | Status: DC | PRN
  Administered 2011-12-16 (×3): via INTRAVENOUS

## 2011-12-16 MED ORDER — LIDOCAINE-EPINEPHRINE 0.5-1:200000 % IJ SOLN
INTRAMUSCULAR | Status: DC | PRN
  Administered 2011-12-16: 10 mL

## 2011-12-16 MED ORDER — HYDROMORPHONE HCL PF 1 MG/ML IJ SOLN
INTRAMUSCULAR | Status: AC
  Administered 2011-12-16: 0.5 mg via INTRAVENOUS
  Filled 2011-12-16: qty 1

## 2011-12-16 MED ORDER — SODIUM CHLORIDE 0.9 % IJ SOLN: 3.0000 mL | Freq: Two times a day (BID) | INTRAMUSCULAR | Status: DC

## 2011-12-16 MED ORDER — DROPERIDOL 2.5 MG/ML IJ SOLN
INTRAMUSCULAR | Status: DC | PRN
  Administered 2011-12-16: 0.625 mg via INTRAVENOUS

## 2011-12-16 MED ORDER — GLYCOPYRROLATE 0.2 MG/ML IJ SOLN
INTRAMUSCULAR | Status: DC | PRN
  Administered 2011-12-16: .6 mg via INTRAVENOUS

## 2011-12-16 MED ORDER — ONDANSETRON HCL 4 MG/2ML IJ SOLN
INTRAMUSCULAR | Status: DC | PRN
  Administered 2011-12-16 (×2): 4 mg via INTRAVENOUS

## 2011-12-16 MED ORDER — ONDANSETRON HCL 4 MG/2ML IJ SOLN
4.0000 mg | Freq: Four times a day (QID) | INTRAMUSCULAR | Status: DC | PRN
  Filled 2011-12-16: qty 2

## 2011-12-16 MED ORDER — ROCURONIUM BROMIDE 100 MG/10ML IV SOLN
INTRAVENOUS | Status: DC | PRN
  Administered 2011-12-16: 50 mg via INTRAVENOUS
  Administered 2011-12-16: 20 mg via INTRAVENOUS
  Administered 2011-12-16 (×2): 10 mg via INTRAVENOUS

## 2011-12-16 MED ORDER — ACETAMINOPHEN 650 MG RE SUPP: 650.0000 mg | RECTAL | Status: DC | PRN

## 2011-12-16 MED ORDER — CEFAZOLIN SODIUM 1-5 GM-% IV SOLN
1.0000 g | Freq: Three times a day (TID) | INTRAVENOUS | Status: AC
  Administered 2011-12-16 – 2011-12-17 (×2): 1 g via INTRAVENOUS
  Filled 2011-12-16 (×2): qty 50

## 2011-12-16 MED ORDER — NEOSTIGMINE METHYLSULFATE 1 MG/ML IJ SOLN
INTRAMUSCULAR | Status: DC | PRN
  Administered 2011-12-16: 4 mg via INTRAVENOUS

## 2011-12-16 MED ORDER — CHLORTHALIDONE 25 MG PO TABS
12.5000 mg | ORAL_TABLET | Freq: Every day | ORAL | Status: DC
  Administered 2011-12-19: 12.5 mg via ORAL
  Filled 2011-12-16 (×4): qty 0.5

## 2011-12-16 MED ORDER — EPHEDRINE SULFATE 50 MG/ML IJ SOLN
INTRAMUSCULAR | Status: DC | PRN
  Administered 2011-12-16 (×3): 10 mg via INTRAVENOUS

## 2011-12-16 MED ORDER — ATENOLOL 25 MG PO TABS
25.0000 mg | ORAL_TABLET | Freq: Every day | ORAL | Status: DC
  Filled 2011-12-16 (×4): qty 1

## 2011-12-16 MED ORDER — DIAZEPAM 5 MG PO TABS: 5.0000 mg | ORAL_TABLET | Freq: Four times a day (QID) | ORAL | Status: DC | PRN

## 2011-12-16 MED ORDER — MORPHINE SULFATE 2 MG/ML IJ SOLN: 0.0500 mg/kg | INTRAMUSCULAR | Status: DC | PRN

## 2011-12-16 MED ORDER — NALOXONE HCL 0.4 MG/ML IJ SOLN: 0.4000 mg | INTRAMUSCULAR | Status: DC | PRN

## 2011-12-16 MED ORDER — HYDROMORPHONE 0.3 MG/ML IV SOLN
INTRAVENOUS | Status: DC
  Administered 2011-12-16: 17:00:00 via INTRAVENOUS
  Administered 2011-12-17: 0.3 mg via INTRAVENOUS
  Administered 2011-12-17: 0.3 mL via INTRAVENOUS
  Administered 2011-12-17: 0.6 mg via INTRAVENOUS
  Administered 2011-12-17: 0.3 mg via INTRAVENOUS
  Administered 2011-12-18 (×2): 0.3 mL via INTRAVENOUS
  Administered 2011-12-18: 0.3 mg via INTRAVENOUS
  Administered 2011-12-18: 0.6 mg via INTRAVENOUS
  Administered 2011-12-18: 0.3 mL via INTRAVENOUS
  Administered 2011-12-18: 0.3 mg via INTRAVENOUS

## 2011-12-16 MED ORDER — ONDANSETRON HCL 4 MG/2ML IJ SOLN
4.0000 mg | INTRAMUSCULAR | Status: DC | PRN
  Administered 2011-12-16 – 2011-12-17 (×3): 4 mg via INTRAVENOUS
  Filled 2011-12-16 (×2): qty 2

## 2011-12-16 MED ORDER — MEPERIDINE HCL 25 MG/ML IJ SOLN: 6.2500 mg | INTRAMUSCULAR | Status: DC | PRN

## 2011-12-16 MED ORDER — SENNA 8.6 MG PO TABS
1.0000 | ORAL_TABLET | Freq: Two times a day (BID) | ORAL | Status: DC
  Administered 2011-12-16 – 2011-12-18 (×4): 8.6 mg via ORAL
  Filled 2011-12-16 (×7): qty 1

## 2011-12-16 MED ORDER — ACETAMINOPHEN 325 MG PO TABS
650.0000 mg | ORAL_TABLET | ORAL | Status: DC | PRN
  Administered 2011-12-17: 650 mg via ORAL
  Filled 2011-12-16: qty 2

## 2011-12-16 MED ORDER — LEVOTHYROXINE SODIUM 125 MCG PO TABS
125.0000 ug | ORAL_TABLET | Freq: Every day | ORAL | Status: DC
  Administered 2011-12-19: 125 ug via ORAL
  Filled 2011-12-16 (×4): qty 1

## 2011-12-16 MED ORDER — ZOLPIDEM TARTRATE 5 MG PO TABS: 5.0000 mg | ORAL_TABLET | Freq: Every evening | ORAL | Status: DC | PRN

## 2011-12-16 MED ORDER — HYDROMORPHONE HCL PF 1 MG/ML IJ SOLN
0.2500 mg | INTRAMUSCULAR | Status: DC | PRN
  Administered 2011-12-16 (×2): 0.5 mg via INTRAVENOUS

## 2011-12-16 MED ORDER — PROPOFOL 10 MG/ML IV EMUL
INTRAVENOUS | Status: DC | PRN
  Administered 2011-12-16: 200 mg via INTRAVENOUS

## 2011-12-16 MED ORDER — ATENOLOL-CHLORTHALIDONE 50-25 MG PO TABS: 0.5000 | ORAL_TABLET | Freq: Every day | ORAL | Status: DC

## 2011-12-16 MED ORDER — SODIUM CHLORIDE 0.9 % IJ SOLN: 9.0000 mL | INTRAMUSCULAR | Status: DC | PRN

## 2011-12-16 MED ORDER — SODIUM CHLORIDE 0.9 % IJ SOLN: 3.0000 mL | INTRAMUSCULAR | Status: DC | PRN

## 2011-12-16 MED ORDER — MENTHOL 3 MG MT LOZG: 1.0000 | LOZENGE | OROMUCOSAL | Status: DC | PRN

## 2011-12-16 MED ORDER — ARTIFICIAL TEARS OP OINT
TOPICAL_OINTMENT | OPHTHALMIC | Status: DC | PRN
  Administered 2011-12-16: 1 via OPHTHALMIC

## 2011-12-16 MED ORDER — FENTANYL CITRATE 0.05 MG/ML IJ SOLN
INTRAMUSCULAR | Status: DC | PRN
  Administered 2011-12-16 (×2): 50 ug via INTRAVENOUS
  Administered 2011-12-16: 150 ug via INTRAVENOUS
  Administered 2011-12-16 (×2): 50 ug via INTRAVENOUS

## 2011-12-16 MED ORDER — HEMOSTATIC AGENTS (NO CHARGE) OPTIME
TOPICAL | Status: DC | PRN
  Administered 2011-12-16: 1 via TOPICAL

## 2011-12-16 MED ORDER — SODIUM CHLORIDE 0.9 % IV SOLN: 250.0000 mL | INTRAVENOUS | Status: DC

## 2011-12-16 MED ORDER — DIPHENHYDRAMINE HCL 50 MG/ML IJ SOLN: 12.5000 mg | Freq: Four times a day (QID) | INTRAMUSCULAR | Status: DC | PRN

## 2011-12-16 MED ORDER — ALBUMIN HUMAN 5 % IV SOLN
INTRAVENOUS | Status: DC | PRN
  Administered 2011-12-16: 15:00:00 via INTRAVENOUS

## 2011-12-16 MED ORDER — SCOPOLAMINE 1 MG/3DAYS TD PT72
MEDICATED_PATCH | TRANSDERMAL | Status: AC
  Filled 2011-12-16: qty 1

## 2011-12-16 MED ORDER — DIPHENHYDRAMINE HCL 12.5 MG/5ML PO ELIX: 12.5000 mg | ORAL_SOLUTION | Freq: Four times a day (QID) | ORAL | Status: DC | PRN

## 2011-12-16 MED ORDER — MIDAZOLAM HCL 5 MG/5ML IJ SOLN
INTRAMUSCULAR | Status: DC | PRN
  Administered 2011-12-16: 2 mg via INTRAVENOUS

## 2011-12-16 SURGICAL SUPPLY — 66 items
ADH SKN CLS APL DERMABOND .7 (GAUZE/BANDAGES/DRESSINGS) ×1
APL SKNCLS STERI-STRIP NONHPOA (GAUZE/BANDAGES/DRESSINGS)
BAG DECANTER FOR FLEXI CONT (MISCELLANEOUS) ×2 IMPLANT
BENZOIN TINCTURE PRP APPL 2/3 (GAUZE/BANDAGES/DRESSINGS) IMPLANT
BLADE SURG ROTATE 9660 (MISCELLANEOUS) IMPLANT
BUR MATCHSTICK NEURO 3.0 LAGG (BURR) ×2 IMPLANT
CANISTER SUCTION 2500CC (MISCELLANEOUS) ×2 IMPLANT
CLOTH BEACON ORANGE TIMEOUT ST (SAFETY) ×2 IMPLANT
CONT SPEC 4OZ CLIKSEAL STRL BL (MISCELLANEOUS) ×2 IMPLANT
COVER BACK TABLE 24X17X13 BIG (DRAPES) IMPLANT
DECANTER SPIKE VIAL GLASS SM (MISCELLANEOUS) ×2 IMPLANT
DERMABOND ADVANCED (GAUZE/BANDAGES/DRESSINGS) ×1
DERMABOND ADVANCED .7 DNX12 (GAUZE/BANDAGES/DRESSINGS) ×1 IMPLANT
DRAPE C-ARM 42X72 X-RAY (DRAPES) ×4 IMPLANT
DRAPE LAPAROTOMY 100X72X124 (DRAPES) ×2 IMPLANT
DRAPE POUCH INSTRU U-SHP 10X18 (DRAPES) ×2 IMPLANT
DRAPE SURG 17X23 STRL (DRAPES) ×2 IMPLANT
DRESSING TELFA 8X3 (GAUZE/BANDAGES/DRESSINGS) IMPLANT
DURAPREP 26ML APPLICATOR (WOUND CARE) ×2 IMPLANT
ELECT REM PT RETURN 9FT ADLT (ELECTROSURGICAL) ×2
ELECTRODE REM PT RTRN 9FT ADLT (ELECTROSURGICAL) ×1 IMPLANT
GAUZE SPONGE 4X4 16PLY XRAY LF (GAUZE/BANDAGES/DRESSINGS) IMPLANT
GLOVE BIOGEL PI IND STRL 6.5 (GLOVE) IMPLANT
GLOVE BIOGEL PI IND STRL 8 (GLOVE) IMPLANT
GLOVE BIOGEL PI INDICATOR 6.5 (GLOVE) ×1
GLOVE BIOGEL PI INDICATOR 8 (GLOVE) ×3
GLOVE ECLIPSE 6.5 STRL STRAW (GLOVE) ×4 IMPLANT
GLOVE EXAM NITRILE LRG STRL (GLOVE) IMPLANT
GLOVE EXAM NITRILE MD LF STRL (GLOVE) IMPLANT
GLOVE EXAM NITRILE XL STR (GLOVE) IMPLANT
GLOVE EXAM NITRILE XS STR PU (GLOVE) IMPLANT
GLOVE INDICATOR 7.0 STRL GRN (GLOVE) ×1 IMPLANT
GLOVE INDICATOR 8.0 STRL GRN (GLOVE) ×3 IMPLANT
GOWN BRE IMP SLV AUR LG STRL (GOWN DISPOSABLE) ×5 IMPLANT
GOWN BRE IMP SLV AUR XL STRL (GOWN DISPOSABLE) IMPLANT
GOWN STRL REIN 2XL LVL4 (GOWN DISPOSABLE) ×1 IMPLANT
KIT BASIN OR (CUSTOM PROCEDURE TRAY) ×2 IMPLANT
KIT POSITION SURG JACKSON T1 (MISCELLANEOUS) ×2 IMPLANT
KIT ROOM TURNOVER OR (KITS) ×2 IMPLANT
NDL HYPO 25X1 1.5 SAFETY (NEEDLE) ×1 IMPLANT
NDL SPNL 18GX3.5 QUINCKE PK (NEEDLE) IMPLANT
NEEDLE HYPO 25X1 1.5 SAFETY (NEEDLE) ×2 IMPLANT
NEEDLE SPNL 18GX3.5 QUINCKE PK (NEEDLE) IMPLANT
NS IRRIG 1000ML POUR BTL (IV SOLUTION) ×2 IMPLANT
PACK FOAM VITOSS 10CC (Orthopedic Implant) ×1 IMPLANT
PACK LAMINECTOMY NEURO (CUSTOM PROCEDURE TRAY) ×2 IMPLANT
PAD ARMBOARD 7.5X6 YLW CONV (MISCELLANEOUS) ×6 IMPLANT
ROD COBALT 47.5X35 (Rod) ×2 IMPLANT
SCREW MAS 4.5X45 (Screw) ×1 IMPLANT
SCREW MAS 5.5X45 (Screw) ×3 IMPLANT
SCREW SET SOLERA (Screw) ×8 IMPLANT
SCREW SET SOLERA TI (Screw) IMPLANT
SPACER OPAL 10X24MM (Orthopedic Implant) ×2 IMPLANT
SPONGE GAUZE 4X4 12PLY (GAUZE/BANDAGES/DRESSINGS) IMPLANT
SPONGE LAP 4X18 X RAY DECT (DISPOSABLE) IMPLANT
SPONGE SURGIFOAM ABS GEL 100 (HEMOSTASIS) ×2 IMPLANT
STRIP CLOSURE SKIN 1/2X4 (GAUZE/BANDAGES/DRESSINGS) IMPLANT
SUT PROLENE 6 0 BV (SUTURE) IMPLANT
SUT VIC AB 0 CT1 18XCR BRD8 (SUTURE) ×1 IMPLANT
SUT VIC AB 0 CT1 8-18 (SUTURE) ×2
SUT VIC AB 2-0 CT1 18 (SUTURE) ×2 IMPLANT
SUT VIC AB 3-0 SH 8-18 (SUTURE) ×3 IMPLANT
SYR 20ML ECCENTRIC (SYRINGE) ×2 IMPLANT
TOWEL OR 17X24 6PK STRL BLUE (TOWEL DISPOSABLE) ×2 IMPLANT
TOWEL OR 17X26 10 PK STRL BLUE (TOWEL DISPOSABLE) ×2 IMPLANT
WATER STERILE IRR 1000ML POUR (IV SOLUTION) ×2 IMPLANT

## 2011-12-16 NOTE — Anesthesia Preprocedure Evaluation (Addendum)
Anesthesia Evaluation  Patient identified by MRN, date of birth, ID band Patient awake    Reviewed: Allergy & Precautions, H&P , NPO status , Patient's Chart, lab work & pertinent test results, reviewed documented beta blocker date and time   History of Anesthesia Complications (+) PONV  Airway Mallampati: I TM Distance: >3 FB Neck ROM: Full    Dental  (+) Teeth Intact and Dental Advisory Given   Pulmonary neg pulmonary ROS,    Pulmonary exam normal       Cardiovascular hypertension, Pt. on medications and Pt. on home beta blockers Regular Normal    Neuro/Psych  Neuromuscular disease (pain and numbness BLE and hips; right worse than left) Negative Psych ROS   GI/Hepatic negative GI ROS, Neg liver ROS,   Endo/Other  Diabetes mellitus-, Well Controlled, Type 2Hypothyroidism Morbid obesity  Renal/GU      Musculoskeletal  (+) Arthritis -, Osteoarthritis,    Abdominal (+) obese,   Peds negative pediatric ROS (+)  Hematology negative hematology ROS (+)   Anesthesia Other Findings   Reproductive/Obstetrics negative OB ROS                         Anesthesia Physical Anesthesia Plan  ASA: III  Anesthesia Plan: General   Post-op Pain Management:    Induction: Intravenous  Airway Management Planned: Oral ETT  Additional Equipment:   Intra-op Plan:   Post-operative Plan: Extubation in OR  Informed Consent: I have reviewed the patients History and Physical, chart, labs and discussed the procedure including the risks, benefits and alternatives for the proposed anesthesia with the patient or authorized representative who has indicated his/her understanding and acceptance.   Dental advisory given  Plan Discussed with: CRNA, Anesthesiologist and Surgeon  Anesthesia Plan Comments:         Anesthesia Quick Evaluation

## 2011-12-16 NOTE — Transfer of Care (Signed)
Immediate Anesthesia Transfer of Care Note  Patient: Destiny Brown  Procedure(s) Performed:  POSTERIOR LUMBAR FUSION 1 LEVEL - Lumbar two three posterior lumbar interbody fusion with interbody prothesis posterolateral arthrodesis and posterior nonsegmental instrumentation  Patient Location: PACU  Anesthesia Type: General  Level of Consciousness: sedated and patient cooperative  Airway & Oxygen Therapy: Patient Spontanous Breathing and Patient connected to nasal cannula oxygen  Post-op Assessment: Report given to PACU RN, Post -op Vital signs reviewed and stable and Patient moving all extremities X 4  Post vital signs: Reviewed and stable  Complications: No apparent anesthesia complications

## 2011-12-16 NOTE — Progress Notes (Signed)
Pt. Did not take atenolol this AM because she says it makes her heart rate too slow. Atenolol last taken at 2100 last night.

## 2011-12-16 NOTE — H&P (Signed)
Destiny Brown is an 65 y.o. female.   Chief Complaint: Back and right lower extremity pain HPI: See notes below  Past Medical History  Diagnosis Date  . Hypothyroid   . Hypertension   . Diabetes mellitus     NO MEDS,  DIET CONTROLLED  . Arthritis   . PONV (postoperative nausea and vomiting)     PT DOESNOT WANT TO TAKE HER ATENOLOL     DOS.   " IT DROPS MY BP TO LOW"    Past Surgical History  Procedure Date  . Cholecystectomy   . Appendectomy   . Lumbar disc surgery   . Abdominal hysterectomy   . Cataract extraction, bilateral     WITH IMPLANTS    No family history on file. Social History:  reports that she has never smoked. She does not have any smokeless tobacco history on file. She reports that she does not drink alcohol or use illicit drugs.  Allergies:  Allergies  Allergen Reactions  . Codeine Other (See Comments)    Severe Headaches with codeine; but not with other opioids  . Percocet (Oxycodone-Acetaminophen)     hallucinations    Medications Prior to Admission  Medication Dose Route Frequency Provider Last Rate Last Dose  . ceFAZolin (ANCEF) IVPB 2 g/50 mL premix  2 g Intravenous 60 min Pre-Op [address]      . HYDROmorphone (DILAUDID) injection 0.25-0.5 mg  0.25-0.5 mg Intravenous Q5 min PRN Kerby Nora, MD      . meperidine (DEMEROL) injection 6.25-12.5 mg  6.25-12.5 mg Intravenous Q5 min PRN Kerby Nora, MD      . ondansetron [facility]) injection 4 mg  4 mg Intravenous Once PRN Kerby Nora, MD      . DISCONTD: morphine 2 MG/ML injection 0.05 mg/kg  0.05 mg/kg Intravenous Q10 min PRN Kerby Nora, MD       Medications Prior to Admission  Medication Sig Dispense Refill  . atenolol-chlorthalidone (TENORETIC) 50-25 MG per tablet Take 0.5 tablets by mouth daily.       Marland Kitchen levothyroxine (SYNTHROID, LEVOTHROID) 125 MCG tablet Take 125 mcg by mouth daily.          No results found for this or any previous visit (from the past 48 hour(s)). No results  found.  Review of Systems  Constitutional: Negative.   HENT: Positive for neck pain.   Eyes: Negative.   Respiratory:       Asthma  Cardiovascular:       Hypertension  Gastrointestinal: Negative.   Genitourinary: Negative.   Musculoskeletal: Positive for back pain.       Fx right foot  Skin: Negative.   Neurological: Negative.   Endo/Heme/Allergies:       Thyroid disease  Psychiatric/Behavioral: Negative.     Blood pressure 139/77, pulse 66, temperature 98.1 F (36.7 C), temperature source Oral, resp. rate 18, SpO2 97.00%. Physical Exam  Constitutional: She is oriented to person, place, and time. She appears well-developed and well-nourished.  HENT:  Head: Normocephalic and atraumatic.  Eyes: Conjunctivae and EOM are normal. Pupils are equal, round, and reactive to light.  Neck: Neck supple.  Cardiovascular: Normal rate, regular rhythm and normal heart sounds.   Respiratory: Effort normal and breath sounds normal.  Musculoskeletal: Normal range of motion.  Neurological: She is alert and oriented to person, place, and time. She has normal strength and normal reflexes. No cranial nerve deficit. She displays a negative Romberg sign.  Mrs.  Brown is a 65yo woman well known to me who presents with a herniated disc at L2/3 on the right side. She is also retrolisthesed at L2 on L3. She was previously decompressed and fused L3-4 level she has had epidural injections without relief she states that at this point the pain is too bad for her to keep going like she is. She had a simple lumbar decompression previously at L2/3 and she now has this disc herniation. She has no pain on the left side. Flexion and extension films show some mild instability. I believe that with the retrolisthesis and the disc herniation that I cannot do this cas without providing for some stabilization at L2/3. Risks including bleeding, infection, fusion failure, hardware failure, recurrent disc herniation, need for  further surgery, no pain relief and other risks were discussed. Weakness, paralysis, bowel bladder dysfunction also discussed.  Assessment/Plan Or for discetomy and fusion  Destiny Brown L 12/16/2011, 10:41 AM

## 2011-12-16 NOTE — Progress Notes (Signed)
Subjective: Patient reports back pain. leg pain much improved  Objective: Vital signs in last 24 hours: Temp:  [97.7 F (36.5 C)-98.1 F (36.7 C)] 97.7 F (36.5 C) (12/19 1730) Pulse Rate:  [58-94] 69  (12/19 1730) Resp:  [14-22] 18  (12/19 1730) BP: (100-142)/(37-77) 127/71 mmHg (12/19 1730) SpO2:  [95 %-100 %] 95 % (12/19 1730)  Intake/Output from previous day:   Intake/Output this shift: Total I/O In: 3650 [I.V.:2900; IV Piggyback:750] Out: 1110 [Urine:510; Blood:600]  alert and oriented x 4. speech clear and fluent. moving lower extremities well. wound clean  no signs of infection/  Lab Results: No results found for this basename: WBC:2,HGB:2,HCT:2,PLT:2 in the last 72 hours BMET No results found for this basename: NA:2,K:2,CL:2,CO2:2,GLUCOSE:2,BUN:2,CREATININE:2,CALCIUM:2 in the last 72 hours  Studies/Results: Dg Lumbar Spine 2-3 Views  12/16/2011  *RADIOLOGY REPORT*  Clinical Data: Posterior lumbar interbody fusion of L2-L3.  LUMBAR SPINE - 2-3 VIEW  Comparison: MRI  08/12/2011  Findings: Two lateral intraoperative images were obtained.  The first image labeled 12:04 p.m. has a surgical instrument at L1-L2. The second image labeled 12:20 p.m. has a surgical instrument marking L2-L3.  There is mild retrolisthesis at L2-L3.  Previous interbody fusion at L3-L4 and L4-L5.  IMPRESSION: Surgical marking of L2-L3.  Original Report Authenticated By: Richarda Overlie, M.D.   Dg C-arm 1-60 Min  12/16/2011  *RADIOLOGY REPORT*  Clinical Data: Fusion L2-3.  C-ARM 1-60 MIN  Comparison: Lumbar MRI 08/12/2011  Findings: AP and lateral C-arm images were obtained.  Bilateral pedicle screws have been placed in the lumbar spine.  Interbody spacer has been placed at this disc space between the screws.  I cannot confidently identify the disc space due to lack of visualization of the lumbosacral junction.  IMPRESSION: Pedicle screw and interbody fusion of the lumbar spine. Radiographs are suggested to  confirm surgical level.  Original Report Authenticated By: Camelia Phenes, M.D.    Assessment/Plan: Doing well post op.   LOS: 0 days     Destiny Brown L 12/16/2011, 6:47 PM

## 2011-12-16 NOTE — Op Note (Signed)
12/16/2011  3:45 PM  PATIENT:  Destiny Brown  65 y.o. female  PRE-OPERATIVE DIAGNOSIS:  lumbar degenerative disc lumbar spondylosis2. 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, lumbar radiculopathy lumbago  POST-OPERATIVE DIAGNOSIS:  lumbar degenerative disc L2/3, Lumbar HNP R L2/3,  lumbar spondylosis lumbar radiculopathy lumbago  PROCEDURE:  Procedure(s):POSTERIOR LATERAL INTERBODY ARTHRODESIS, LATERAL DECOMPRESSION IN EXCESS OF plif POSTERIOR lATERAL ARTHRODESIS FUSION 1 LEVEL L2/3, POSTERIOR NONSEGMENTAL INSTRUMENTATION L2/3  SURGEON:  Surgeon(s): Carmela Hurt  ASSISTANTS:NONE  ANESTHESIA:   general  EBL:  Total I/O In: 2750 [I.V.:2000; IV Piggyback:750] Out: 835 [Urine:235; Blood:600]  BLOOD ADMINISTERED:none  CELL SAVER GIVEN:NONE  COUNT:PER NURSING  DRAINS: FOLEY CATHETER none   SPECIMEN: NO SPECIMEN No Specimen  DICTATION: MRS. Destiny Brown WAS BROUGHT TO THE OPERATING ROOM, INTUBATED, AND PLACED UNDER A GENERAL ANESTHETIC WITHOUT DIFFICULTY. A FOLEY CATHETER WAS PLACED UNDER STERILE CONDITIONS. WE THEN ROLLED THE PATIENT ONTO A JACKSON TABLE. ALL PRESSURE POINTS WERE PROPERLY PADDED. HER BACK WAS PREPPED AND DRAPED IN A STERILE FASHION. I OPENED THE SUPERIOR PORTION OF THE OLD INCISION AND EXPOSED THE THORACOLUMBAR FASCIA. I EXPOSED THE LAMINA OF L1,L2, AND L3 BILATERALLY AND CONFIRMED MY POSITION WITH INTRAOPERATIVE XRAY.  I STARTED MY DECOMPRESSION OF THE L2/3 INTERLAMINAR SPACE BY USING A DRILL TO PERFORM SEMIHEMILAMINECTOMIES OF L2 BILATERALLY. I STARTED ON THE LEFT SIDE. I LIBERALLY REMOVED THE INFERIOR FACET IN ORDER TO APPROACH THE DISC SPACE LATERAL TO THE SCAR TISSUE. I USED THE KERRISON PUNCH TO REMOVE LIGAMENT AND BONE UNTIL THE DISC SPACE WAS EXPOSED ON THE LEFT. I RETRACTED THE THECAL SAC MEDIALLY AND OPENED THE DISC SPACE. IN A PROGRESSIVE FASHION I REMOVED DISC FROM THE SPACE AND ENLARGED MY OPENING. USING INTERBODY TOOLS I SCRAPED THE ENDPLATES SUPERIORLY AND  INFERIORLY. I PLACED A PADDLE SPACER ON THE LEFT THEN MOVED TO THE RIGHT SIDE. DOING THE SAME ON THE RIGHT I EXPOSED THE DISC SPACE AND REMOVED THE FRAGMENT AND DISC DECOMPRESSING THE R L2, AND L3 ROOTS. WITH AN AGGRESSIVE DISCETOMY I WAS READY TO PLACE THE INTERBODIES. I SIZED THE SPACE AND FELT 13X24 MM CAGES PACKED WITH MORSELIZED ALLOGRAFT WOULD BE BEST. I PLACED THE CAGES AND CHECKED THEIR POSITION WITH FLOUROSCOPY.   I PLACED PEDICLE SCREWS INTO L2 AND L3. I USED THE SOLERA SYSTEM FROM [employer], I USED 5.5MM X SCREWS IN L3, AND L L2. I USED A X4.5MM SCREW IN THE RIGHT L2 PEDICLE. I USED FLOUROSCOPY TO PLACE THE SCREWS. ALL SCREW HOLES WERE PROBED, TAPPED AND CHECKED FOR CUTOUTS. I DID NOT APPRECIATE BONY CUTOUTS DURING THE CASE.  I PERFORMED A POSTEROLATERAL ARTHRODESIS WITH MORSELIZED ALLO AND AUTOGRAFT FROM L2-L3. I DECORTICATED THE BONE USING THE DRILL ON BOTH SIDES. I PLACED VITOSS ACROSS THE LEVEL.   I COMPLETED THE CONSTRUCT BY PLACING MORE VITOSS INTO THE DISC SPACE. I PLACED THE RODS aND SECURED THEM TO THE SCREWHEADS. I IRRIGATED THE WOUND. I CLOSED THE WOUND IN LAYERED FASHION WITH VICRYL SUTURES. I USED DERMABOND FOR A STERILE DRESSING. MRS. Destiny Brown WAS EXTUBATED AFTER BEING ROLLED SUPINE. SHE MOVED BOTH LOWER EXTREMITIES .  PLAN OF CARE: Admit to inpatient   PATIENT DISPOSITION:  PACU - hemodynamically stable.   Delay start of Pharmacological VTE agent (>24hrs) due to surgical blood loss or risk of bleeding:  yes

## 2011-12-16 NOTE — Anesthesia Postprocedure Evaluation (Signed)
  Anesthesia Post-op Note  Patient: Destiny Brown  Procedure(s) Performed:  POSTERIOR LUMBAR FUSION 1 LEVEL - Lumbar two three posterior lumbar interbody fusion with interbody prothesis posterolateral arthrodesis and posterior nonsegmental instrumentation  Patient Location: PACU  Anesthesia Type: General  Level of Consciousness: sedated  Airway and Oxygen Therapy: Patient Spontanous Breathing and Patient connected to nasal cannula oxygen  Post-op Pain: none  Post-op Assessment: Post-op Vital signs reviewed, Patient's Cardiovascular Status Stable, Respiratory Function Stable, Patent Airway, No signs of Nausea or vomiting and Pain level controlled  Post-op Vital Signs: Reviewed and stable  Complications: No apparent anesthesia complications

## 2011-12-16 NOTE — Anesthesia Procedure Notes (Addendum)
Procedure Name: Intubation Date/Time: 12/16/2011 11:31 AM Performed by: Coralee Rud Pre-anesthesia Checklist: Patient identified, Emergency Drugs available, Suction available and Patient being monitored Patient Re-evaluated:Patient Re-evaluated prior to inductionOxygen Delivery Method: Circle System Utilized Preoxygenation: Pre-oxygenation with 100% oxygen Intubation Type: IV induction Ventilation: Mask ventilation without difficulty Laryngoscope Size: Miller and 3 Grade View: Grade I Tube size: 7.0 mm Number of attempts: 1 Placement Confirmation: ETT inserted through vocal cords under direct vision and positive ETCO2 Secured at: 22 cm Tube secured with: Tape Dental Injury: Teeth and Oropharynx as per pre-operative assessment

## 2011-12-16 NOTE — Preoperative (Addendum)
Beta Blockers   Reason not to administer Beta Blockers:Not Applicable  BB Blocker taken @2100  12/16/11.

## 2011-12-17 NOTE — Progress Notes (Addendum)
Patient ID: Destiny Brown, female   DOB: Nov 03, 1946, 65 y.o.   MRN: [id number] BP 113/43  Pulse 81  Temp(Src) 98.8 F (37.1 C) (Oral)  Resp 18  Ht 5\' 6"  (1.676 m)  Wt 88 kg (194 lb 0.1 oz)  BMI 31.31 kg/m2  SpO2 97% Doing well today. Nauseated with emesis.  Right lower extremity feels good, back is sore. 5/5 lower extremities Wounds clean And dry

## 2011-12-17 NOTE — Progress Notes (Signed)
   CARE MANAGEMENT NOTE 12/17/2011  Patient:  Destiny Brown   Account Number:  [id number]  Date Initiated:  12/17/2011  Documentation initiated by:  GRAVES-BIGELOW,Tyler Robidoux  Subjective/Objective Assessment:   Lumbar degenerative disc lumbar spondylosis2. Lumbar herniated disc right L2/3 s/p LATERAL DECOMPRESSION. POSTERIOR lATERAL ARTHRODESIS FUSION 1 LEVEL L2/3     Action/Plan:   CM attempted to see pt  for HH/DME needs and PT was unable to see pt due to Nausea. They will assess in am.   Anticipated DC Date:  12/20/2011   Anticipated DC Plan:  HOME W HOME HEALTH SERVICES      DC Planning Services  CM consult      [facility] Choice  DURABLE MEDICAL EQUIPMENT  HOME HEALTH   Choice offered to / List presented to:             Status of service:  In process, will continue to follow Medicare Important Message given?   (If response is "NO", the following Medicare IM given date fields will be blank) Date Medicare IM given:   Date Additional Medicare IM given:    Discharge Disposition:    Per UR Regulation:    Comments:  12-17-11 [address] Tomi Bamberger, RN,BSN [phone] CM will continue to moniotr for d/c disposition and needs.

## 2011-12-17 NOTE — Progress Notes (Signed)
PT Note  Pt very nauseous today.  Attempted eval twice.  Pt has been up multiple times with nursing including walking to the bathroom.  Will try again in AM.  [facility] PT [phone]

## 2011-12-17 NOTE — Progress Notes (Signed)
Waiting on PT/OT evaluation to determine [facility] needs.

## 2011-12-17 NOTE — Progress Notes (Signed)
CSW received consult for "SNF." For assessment, please see pt's chart. CSW will cont to follow to assess discharge needs.   Dede Query, MSW, Theresia Majors [phone]

## 2011-12-18 ENCOUNTER — Ambulatory Visit: Payer: Medicare Other

## 2011-12-18 NOTE — Progress Notes (Signed)
Witnessed Amy [employer]/gallery conservator 5mg  Dilaudid PCA in sink Solly Derasmo, Alexia Freestone 12/18/2011 11:41 PM

## 2011-12-18 NOTE — Progress Notes (Signed)
Subjective: Patient reports pain improved. walking better  Objective: Vital signs in last 24 hours: Temp:  [98.6 F (37 C)-98.9 F (37.2 C)] 98.8 F (37.1 C) (12/21 1814) Pulse Rate:  [82-91] 89  (12/21 1814) Resp:  [18-20] 18  (12/21 2000) BP: (120-137)/(50-81) 122/63 mmHg (12/21 1814) SpO2:  [94 %-99 %] 98 % (12/21 2000)  Intake/Output from previous day: 12/20 0701 - 12/21 0700 In: 1440 [P.O.:480; I.V.:960] Out: 250 [Urine:250] Intake/Output this shift:    alert oriented x4 speech clear and fluent. Moving lower extremities well. wound clean dry no signs infection.  Lab Results: No results found for this basename: WBC:2,HGB:2,HCT:2,PLT:2 in the last 72 hours BMET No results found for this basename: NA:2,K:2,CL:2,CO2:2,GLUCOSE:2,BUN:2,CREATININE:2,CALCIUM:2 in the last 72 hours  Studies/Results: No results found.  Assessment/Plan: D/c pca, iv. Continue pt. Possible d/c this weekend  LOS: 2 days     Ekaterina Denise L 12/18/2011, 10:24 PM

## 2011-12-18 NOTE — Discharge Summary (Signed)
Physician Discharge Summary  Patient ID: Destiny Brown MRN: [id number] DOB/AGE: 29-Mar-1946 65 y.o.  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 Post op she has done very well. Wound is clean dry and without signs of infection. At discharge she is voiding, ambulating, and tolerating a regular diet   Consults: none  Significant Diagnostic Studies: none  Treatments: surgery: PLIF L2/3 Synthes cages, morselized allo ( Vitoss) and autograft, PLA L23, pedicle screw fixation (solera) L2/3  Discharge Exam: Blood pressure 122/63, pulse 89, temperature 98.8 F (37.1 C), temperature source Oral, resp. rate 18, height 5\' 6"  (1.676 m), weight 88 kg (194 lb 0.1 oz), SpO2 98.00%. Neurologic: Alert and oriented X 3, normal strength and tone. Normal symmetric reflexes. Normal coordination and gait  Disposition: Home or Self Care  Discharge Orders    Future Appointments: Provider: Department: Dept Phone: Center:   02/12/2012 10:00 AM [facility] [phone] [facility]     Medication List  As of 12/18/2011 10:26 PM   ASK your doctor about these medications         atenolol-chlorthalidone 50-25 MG per tablet   Commonly known as: TENORETIC      FIBER THERAPY PO      levothyroxine 125 MCG tablet   Commonly known as: SYNTHROID, LEVOTHROID      traMADol 50 MG tablet   Commonly known as: ULTRAM      Vitamin D3 1000 UNITS Caps             Signed: Zahlia Deshazer L 12/18/2011, 10:26 PM

## 2011-12-18 NOTE — Progress Notes (Signed)
CSW met with pt and husband to address discharge plan. PT is not recommending SNF at this time. Pt and pt's husband are agreeable to this. CSW is signing off as no further needs identified. Please reconsult if a need arises prior to discharge.   Dede Query, MSW, Theresia Majors [phone]

## 2011-12-18 NOTE — Progress Notes (Signed)
Occupational Therapy Evaluation Patient Details Name: Destiny Brown MRN: [id number] DOB: 09-Nov-1946 Today's Date: 12/18/2011  Problem List:  Patient Active Problem List  Diagnoses  . Lumbar disc herniation with radiculopathy    Past Medical History:  Past Medical History  Diagnosis Date  . Hypothyroid   . Hypertension   . Diabetes mellitus     NO MEDS,  DIET CONTROLLED  . Arthritis   . PONV (postoperative nausea and vomiting)     PT DOESNOT WANT TO TAKE HER ATENOLOL     DOS.   " IT DROPS MY BP TO LOW"  . Bronchitis     h/o  . History of migraines     "when working under alot of stress"  . Headache    Past Surgical History:  Past Surgical History  Procedure Date  . Appendectomy   . Lumbar disc surgery   . Cataract extraction, bilateral     WITH IMPLANTS  . Posterior fusion lumbar spine 12/16/11; 12/2005; 07/2004    L2-3; L3-4; L4-5  . Lumbar laminectomy/decompression microdiscectomy 10/2010  . Cervical fusion 08/2003  . Back surgery   . Abdominal hysterectomy 03/2001  . Cholecystectomy 03/2010    OT Assessment/Plan/Recommendation OT Assessment Clinical Impression Statement: Patient admitted for lumbar surgery and presents with below problem list. will benefit from skilled OT in the acute setting to increase independence with ADL and ADL mobility to Mod/I level prior to d/c home.  OT Recommendation/Assessment: Patient will need skilled OT in the acute care venue OT Problem List: Decreased activity tolerance;Decreased knowledge of use of DME or AE;Decreased knowledge of precautions;Pain OT Therapy Diagnosis : Generalized weakness;Acute pain OT Plan OT Frequency: Min 2X/week OT Treatment/Interventions: Self-care/ADL training;Therapeutic exercise;DME and/or AE instruction;Patient/family education OT Recommendation Follow Up Recommendations: None Equipment Recommended: None recommended by OT Individuals Consulted Consulted and Agree with Results and Recommendations:  Patient OT Goals Acute Rehab OT Goals OT Goal Formulation: With patient Time For Goal Achievement: 7 days ADL Goals Pt Will Perform Grooming: with modified independence;Standing at sink ADL Goal: Grooming - Progress: Progressing toward goals Pt Will Perform Upper Body Bathing: Independently;Sit to stand in shower ADL Goal: Upper Body Bathing - Progress: Progressing toward goals Pt Will Perform Lower Body Bathing: with modified independence (standing in shower) ADL Goal: Lower Body Bathing - Progress: Progressing toward goals Pt Will Perform Lower Body Dressing: with modified independence;Sit to stand from bed ADL Goal: Lower Body Dressing - Progress: Progressing toward goals Pt Will Transfer to Toilet: with modified independence;Ambulation;Regular height toilet (grab bars) ADL Goal: Toilet Transfer - Progress: Progressing toward goals Pt Will Perform Toileting - Hygiene: with modified independence;Sitting on 3-in-1 or toilet (AE prn) ADL Goal: Toileting - Hygiene - Progress: Progressing toward goals Pt Will Perform Tub/Shower Transfer: with modified independence;Ambulation;with DME;Grab bars ADL Goal: Tub/Shower Transfer - Progress: Progressing toward goals Additional ADL Goal #1: Patient will independently generalize/verbalize 3/3 back precautions as precursor to ADLs ADL Goal: Additional Goal #1 - Progress: Progressing toward goals  OT Evaluation Precautions/Restrictions  Precautions Precautions: Back Precaution Booklet Issued: No Precaution Comments: Educated patient on back precautions and how/when to don/doff brace. Spinal Brace: Applied in sitting position;Lumbar corset Restrictions Weight Bearing Restrictions: No Prior Functioning Home Living Lives With: Spouse;Family (2 grandchildren (teens)) Type of Home: House Home Layout: One level Home Access: Stairs to enter Entrance Stairs-Rails: Right Entrance Stairs-Number of Steps: 3 Bathroom Shower/Tub: Investment banker, corporate: Standard Home Adaptive Equipment: Walker - rolling (her father-in-law's?) Additional Comments: Husband  will be home until Tues after Christmas. Grandchildren also available to help Prior Function Level of Independence: Independent with basic ADLs;Independent with homemaking with ambulation Able to Take Stairs?: Reciprically Driving: Yes ADL ADL Eating/Feeding: Performed;Independent Where Assessed - Eating/Feeding: Edge of bed Grooming: Performed;Wash/dry hands;Supervision/safety Grooming Details (indicate cue type and reason): VC for adaptive techniques with grooming to avoid breaking back precautions Where Assessed - Grooming: Standing at sink Upper Body Bathing: Simulated;Supervision/safety Upper Body Bathing Details (indicate cue type and reason): Standing in shower Lower Body Bathing: Simulated Lower Body Bathing Details (indicate cue type and reason): Min guard assist; standing in shower Upper Body Dressing: Not assessed Lower Body Dressing: Not assessed Toilet Transfer: Simulated Toilet Transfer Details (indicate cue type and reason): simulated EOB to chair with Min Assist sit to stand from bed and Min guard assist stand to sit to chair with VC for hand placement. supervision with RW ambulation Toilet Transfer Method: Ambulating Tub/Shower Transfer: Engineer, manufacturing Details (indicate cue type and reason): Educated patient on use of RW and wall along with sideways entry/exit. Patient has grab bars at home.  ADL Comments: Educated patient on and demonstrated use of AE. Patient verbalized understanding but declined practice. Will benefit from toilet transfer practice and possible toilet aide demonstration. Overall, doing very well and to have necessary level of assist upon d/c home. Cognition Cognition Arousal/Alertness: Awake/alert Overall Cognitive Status: Appears within functional limits for tasks assessed Orientation  Level: Oriented X4 Sensation/Coordination Sensation Light Touch: Appears Intact Coordination Gross Motor Movements are Fluid and Coordinated: Yes Fine Motor Movements are Fluid and Coordinated: Yes Extremity Assessment RUE Assessment RUE Assessment: Within Functional Limits LUE Assessment LUE Assessment: Within Functional Limits Mobility  Bed Mobility Rolling Right: 5: Supervision (HOB flat, no rail) Rolling Right Details (indicate cue type and reason): VC for sequencing Right Sidelying to Sit: 4: Min assist;HOB flat (no rails) Right Sidelying to Sit Details (indicate cue type and reason): VC for sequencing and to avoid twisting End of Session OT - End of Session Equipment Utilized During Treatment: Gait belt;Back brace Activity Tolerance: Patient tolerated treatment well Patient left: in chair;with call bell in reach Nurse Communication: Mobility status for ambulation General Behavior During Session: [facility] for tasks performed Cognition: [facility] for tasks performed   Kirstein Baxley 12/18/2011, 11:07 AM

## 2011-12-18 NOTE — Progress Notes (Signed)
Physical Therapy Evaluation Patient Details Name: Destiny Brown MRN: [id number] DOB: [birth year] Today's Date: 12/18/2011  Problem List:  Patient Active Problem List  Diagnoses  . Lumbar disc herniation with radiculopathy    Past Medical History:  Past Medical History  Diagnosis Date  . Hypothyroid   . Hypertension   . Diabetes mellitus     NO MEDS,  DIET CONTROLLED  . Arthritis   . PONV (postoperative nausea and vomiting)     PT DOESNOT WANT TO TAKE HER ATENOLOL     DOS.   " IT DROPS MY BP TO LOW"  . Bronchitis     h/o  . History of migraines     "when working under alot of stress"  . Headache    Past Surgical History:  Past Surgical History  Procedure Date  . Appendectomy   . Lumbar disc surgery   . Cataract extraction, bilateral     WITH IMPLANTS  . Posterior fusion lumbar spine 12/16/11; 12/2005; 07/2004    L2-3; L3-4; L4-5  . Lumbar laminectomy/decompression microdiscectomy 10/2010  . Cervical fusion 08/2003  . Back surgery   . Abdominal hysterectomy 03/2001  . Cholecystectomy 03/2010    PT Assessment/Plan/Recommendation Clinical Impression Statement: Pt is s/p 3rd back surgery, however reports has never had physical therapy instruction in proper body mechanics or use of DME.  Will benefit from skilled PT to address the problems below and to assist with d/c home with husband's assist PT Recommendation/Assessment: Patient will need skilled PT in the acute care venue PT Problem List: Decreased mobility;Decreased knowledge of use of DME;Decreased knowledge of precautions;Pain Barriers to Discharge: None PT Therapy Diagnosis : Difficulty walking;Acute pain PT Plan PT Frequency: Min 5X/week PT Treatment/Interventions: DME instruction;Gait training;Stair training;Functional mobility training;Patient/family education PT Recommendation Follow Up Recommendations: None Equipment Recommended: None recommended by PT PT Goals  Acute Rehab PT Goals PT Goal Formulation:  With patient Time For Goal Achievement: 7 days Pt will Roll Supine to Right Side: Independently PT Goal: Rolling Supine to Right Side - Progress: Not met Pt will go Supine/Side to Sit: with modified independence PT Goal: Supine/Side to Sit - Progress: Not met Pt will go Sit to Supine/Side: with modified independence PT Goal: Sit to Supine/Side - Progress: Not met Pt will go Sit to Stand: with supervision PT Goal: Sit to Stand - Progress: Not met Pt will go Stand to Sit: with modified independence PT Goal: Stand to Sit - Progress: Not met Pt will Ambulate: >150 feet;with supervision;with least restrictive assistive device PT Goal: Ambulate - Progress: Not met Pt will Go Up / Down Stairs: 3-5 stairs;with min assist;with rail(s) PT Goal: Up/Down Stairs - Progress: Not met Additional Goals Additional Goal #1: Pt will verbalize and demonstrate understanding of back precautions during mobility.  PT Evaluation Precautions/Restrictions  Precautions Precautions: Back Precaution Booklet Issued: Yes (comment) Precaution Comments: Pt able to verbalize 2/3 precautions after OT session this a.m. (did not recall no arching) Spinal Brace: Lumbar corset;Applied in sitting position Prior Functioning  Home Living Lives With: Spouse;Family (2 grandchildren (teens)) Type of Home: House Home Layout: One level Home Access: Stairs to enter Entrance Stairs-Rails: Right Entrance Stairs-Number of Steps: 3 Bathroom Shower/Tub: Tub/shower unit;Curtain Firefighter: Standard Home Adaptive Equipment: Walker - rolling (her father-in-law's?) Additional Comments: Husband will be home until [place] after Christmas. Grandchildren also available to help Prior Function Level of Independence: Independent with basic ADLs;Independent with homemaking with ambulation Driving: Yes Cognition Cognition Arousal/Alertness: Awake/alert Overall Cognitive Status:  Appears within functional limits for tasks  assessed Orientation Level: Oriented X4 Sensation/Coordination   Extremity Assessment RLE Assessment RLE Assessment: Within Functional Limits LLE Assessment LLE Assessment: Within Functional Limits Mobility (including Balance) Bed Mobility Sit to Supine - Right: 6: Modified independent (Device/Increase time);HOB flat Transfers Sit to Stand: 5: Supervision;With upper extremity assist;From chair/3-in-1 Sit to Stand Details (indicate cue type and reason): Pt demonstrated safe technique; supervision for safety due to meds/anesthesia Stand to Sit: 5: Supervision;With upper extremity assist;To bed Stand to Sit Details: Pt demonstrated safe technique; supervision for safety due to meds/anesthesia Ambulation/Gait Ambulation/Gait: Yes Ambulation/Gait Assistance: 5: Supervision Ambulation/Gait Assistance Details (indicate cue type and reason): vc for proper use of RW (keeping it close enough to body to prevent flexion and turning with small steps to prevent twisting) Ambulation Distance (Feet): 200 Feet Assistive device: Rolling walker Gait Pattern: Within Functional Limits    Exercise    End of Session PT - End of Session Equipment Utilized During Treatment: Back brace Activity Tolerance: Patient tolerated treatment well Patient left: in bed;with call bell in reach;with family/visitor present Nurse Communication: Mobility status for ambulation General Behavior During Session: [facility] for tasks performed  Cristian Davitt 12/18/2011, 3:39 PM Pager [phone]

## 2011-12-18 NOTE — Progress Notes (Signed)
Dilaudid PCA d/c 2326. 5mg  wasted. Benjiman Core, RN witnessed. Unable to waste via Pyxis since syringe was sent to floor by pharmacy via secure code translogic system.

## 2011-12-19 NOTE — Progress Notes (Signed)
Physical Therapy Treatment Patient Details Name: Destiny Brown MRN: [id number] DOB: [birth year] Today's Date: 12/19/2011  PT Assessment/Plan  PT - Assessment/Plan Comments on Treatment Session: Plans are for pt to D/C home today.  Pt moving well.  Husband present entire session.  Pt's personal RW was adjusted to proper height.   PT Plan: Discharge plan remains appropriate PT Frequency: Min 5X/week Follow Up Recommendations: None Equipment Recommended: None recommended by PT PT Goals  Acute Rehab PT Goals PT Goal: Sit to Stand - Progress: Met PT Goal: Stand to Sit - Progress: Met PT Goal: Ambulate - Progress: Met (Met with use of RW) PT Goal: Up/Down Stairs - Progress: Met  PT Treatment Precautions/Restrictions  Precautions Precautions: Back Precaution Booklet Issued: Yes (comment) Precaution Comments: Recalled 3/3 back precautions Spinal Brace: Lumbar corset;Applied in sitting position Restrictions Weight Bearing Restrictions: No Mobility (including Balance) Bed Mobility Bed Mobility: No Transfers Sit to Stand: 6: Modified independent (Device/Increase time);From chair/3-in-1;With upper extremity assist Stand to Sit: 6: Modified independent (Device/Increase time);To chair/3-in-1;With upper extremity assist Ambulation/Gait Ambulation/Gait Assistance: 6: Modified independent (Device/Increase time) Ambulation Distance (Feet): 250 Feet Assistive device: Rolling walker Gait Pattern: Step-through pattern Stairs: Yes Stairs Assistance: 5: Supervision Stairs Assistance Details (indicate cue type and reason): (S) for safety & reinforcement cues for no twisting but no physical (A) needed.  Husband present.   Stair Management Technique: One rail Left;Step to pattern;Forwards Number of Stairs: 4     Exercise    End of Session PT - End of Session Equipment Utilized During Treatment: Gait belt;Back brace Activity Tolerance: Patient tolerated treatment well Patient left: in  chair;with call bell in reach;with family/visitor present Nurse Communication: Mobility status for ambulation General Behavior During Session: [facility] for tasks performed Cognition: [facility] for tasks performed  Lara Mulch 12/19/2011, 12:04 PM [phone]

## 2012-02-12 ENCOUNTER — Ambulatory Visit
Admission: RE | Admit: 2012-02-12 | Discharge: 2012-02-12 | Disposition: A | Discharge status: Home / Self Care | Payer: Medicare Other | Source: Ambulatory Visit | Attending: Internal Medicine | Admitting: Internal Medicine

## 2012-02-12 DIAGNOSIS — Z1231 Encounter for screening mammogram for malignant neoplasm of breast: Secondary | ICD-10-CM

## 2012-02-23 ENCOUNTER — Other Ambulatory Visit: Payer: Self-pay | Admitting: Neurosurgery

## 2012-02-23 DIAGNOSIS — M549 Dorsalgia, unspecified: Secondary | ICD-10-CM

## 2012-03-01 ENCOUNTER — Ambulatory Visit
Admission: RE | Admit: 2012-03-01 | Discharge: 2012-03-01 | Disposition: A | Discharge status: Home / Self Care | Payer: Medicare Other | Source: Ambulatory Visit | Attending: Neurosurgery | Admitting: Neurosurgery

## 2012-03-01 DIAGNOSIS — M549 Dorsalgia, unspecified: Secondary | ICD-10-CM

## 2012-03-01 MED ORDER — GADOBENATE DIMEGLUMINE 529 MG/ML IV SOLN
18.0000 mL | Freq: Once | INTRAVENOUS | Status: AC | PRN
  Administered 2012-03-01: 18 mL via INTRAVENOUS

## 2012-09-15 ENCOUNTER — Other Ambulatory Visit: Payer: Self-pay | Admitting: Neurosurgery

## 2012-09-15 DIAGNOSIS — M79606 Pain in leg, unspecified: Secondary | ICD-10-CM

## 2012-09-19 ENCOUNTER — Other Ambulatory Visit: Payer: Medicare Other

## 2012-09-28 ENCOUNTER — Other Ambulatory Visit: Payer: Medicare Other

## 2013-01-09 ENCOUNTER — Other Ambulatory Visit: Payer: Self-pay | Admitting: Internal Medicine

## 2013-01-09 DIAGNOSIS — Z1231 Encounter for screening mammogram for malignant neoplasm of breast: Secondary | ICD-10-CM

## 2013-02-13 ENCOUNTER — Ambulatory Visit
Admission: RE | Admit: 2013-02-13 | Discharge: 2013-02-13 | Disposition: A | Discharge status: Home / Self Care | Payer: Medicare Other | Source: Ambulatory Visit | Attending: Internal Medicine | Admitting: Internal Medicine

## 2013-02-13 DIAGNOSIS — Z1231 Encounter for screening mammogram for malignant neoplasm of breast: Secondary | ICD-10-CM

## 2013-05-07 ENCOUNTER — Encounter (HOSPITAL_COMMUNITY): Payer: Self-pay | Admitting: Emergency Medicine

## 2013-05-07 ENCOUNTER — Emergency Department (INDEPENDENT_AMBULATORY_CARE_PROVIDER_SITE_OTHER)
Admission: EM | Admit: 2013-05-07 | Discharge: 2013-05-07 | Disposition: A | Discharge status: Home / Self Care | Payer: Medicare Other | Source: Home / Self Care

## 2013-05-07 DIAGNOSIS — S81811A Laceration without foreign body, right lower leg, initial encounter: Secondary | ICD-10-CM

## 2013-05-07 DIAGNOSIS — S81009A Unspecified open wound, unspecified knee, initial encounter: Secondary | ICD-10-CM

## 2013-05-07 DIAGNOSIS — S91009A Unspecified open wound, unspecified ankle, initial encounter: Secondary | ICD-10-CM

## 2013-05-07 NOTE — ED Provider Notes (Signed)
Destiny Brown is a 67 y.o. female who presents to Urgent Care today for right leg laceration. [name] fell today hitting her right shin on a dresser drawer. This resulted in a laceration to her right distal tibia. She controlled bleeding with pressure and presented to the [facility] for treatment. She feels well otherwise. No radiating pain, weakness or numbness. No fevers or chills.  No distal numbness or weakness.    PMH reviewed. DM  History  Substance Use Topics  . Smoking status: Never Smoker   . Smokeless tobacco: Never Used  . Alcohol Use: No   ROS as above Medications reviewed. No current facility-administered medications for this encounter.   Current Outpatient Prescriptions  Medication Sig Dispense Refill  . atenolol-chlorthalidone (TENORETIC) 50-25 MG per tablet Take 0.5 tablets by mouth daily.       . Cholecalciferol (VITAMIN D3) 1000 UNITS CAPS Take 1,000 Units by mouth daily.        Marland Kitchen levothyroxine (SYNTHROID, LEVOTHROID) 125 MCG tablet Take 125 mcg by mouth daily.        . Methylcellulose, Laxative, (FIBER THERAPY PO) Take 1 tablet by mouth daily.        . traMADol (ULTRAM) 50 MG tablet Take 50 mg by mouth at bedtime. Maximum dose= 8 tablets per day         Exam:  BP 143/66  Pulse 59  Temp(Src) 97.8 F (36.6 C) (Oral)  Resp 16  SpO2 100% Gen: Well NAD Right Leg: Curved laceration extending to the deep fascia overlaying the distal tibia. Total length is around 5 cm. Apex pointing distally. Fascia is intact.  Neurovascular intact distally.  Gait: Mildly antalgic.   Right leg laceration repair: Consent obtained and timeout performed 6 mL of 1% lidocaine with epinephrine were injected subcutaneously starting a laceration to achieve anesthesia.  The wound was then extensively irrigated with sterile saline.  The surrounding skin was then prepped with Betadine and sterile drapes were applied. A horizontal mattress suture using 4-0 Prolene was used to approximate the  apex. Then 10 simple interrupted sutures were used to close the wound.  Patient tolerated the procedure well.   Assessment and Plan: 67 y.o. female with right leg laceration.  Extended through the subcutaneous tissue but does not involve the periosteum.  The laceration was closed.  Patient will followup with her primary care provider     Rodolph Bong, MD 05/07/13 1744  Rodolph Bong, MD 05/07/13 1745

## 2013-05-07 NOTE — ED Notes (Signed)
Laceration to right leg pt tripped over dresser and fell on drawer cutting leg. Laceration was cleaned and wrapped.   Bandage removed some active bleeding. Pt is sitting up right with no signs of distress.

## 2013-05-08 NOTE — ED Provider Notes (Signed)
Medical screening examination/treatment/procedure(s) were performed by -physician practitioner and as supervising physician I was immediately available for consultation/collaboration.  Donella Pascarella   Marion Seese, MD 05/08/13 0852 

## 2014-01-18 ENCOUNTER — Encounter (HOSPITAL_COMMUNITY): Payer: Self-pay | Admitting: Emergency Medicine

## 2014-01-18 ENCOUNTER — Emergency Department (INDEPENDENT_AMBULATORY_CARE_PROVIDER_SITE_OTHER): Payer: Medicare Other

## 2014-01-18 ENCOUNTER — Emergency Department (INDEPENDENT_AMBULATORY_CARE_PROVIDER_SITE_OTHER)
Admission: EM | Admit: 2014-01-18 | Discharge: 2014-01-18 | Disposition: A | Discharge status: Home / Self Care | Payer: Medicare Other | Source: Home / Self Care | Attending: Emergency Medicine | Admitting: Emergency Medicine

## 2014-01-18 DIAGNOSIS — J45901 Unspecified asthma with (acute) exacerbation: Secondary | ICD-10-CM

## 2014-01-18 DIAGNOSIS — J189 Pneumonia, unspecified organism: Secondary | ICD-10-CM

## 2014-01-18 MED ORDER — PREDNISONE 10 MG PO TABS: 20.0000 mg | ORAL_TABLET | Freq: Every day | ORAL | Status: DC

## 2014-01-18 MED ORDER — LEVOFLOXACIN 500 MG PO TABS: 500.0000 mg | ORAL_TABLET | Freq: Every day | ORAL | Status: DC

## 2014-01-18 MED ORDER — ALBUTEROL SULFATE HFA 108 (90 BASE) MCG/ACT IN AERS: 1.0000 | INHALATION_SPRAY | Freq: Four times a day (QID) | RESPIRATORY_TRACT | Status: DC | PRN

## 2014-01-18 MED ORDER — IPRATROPIUM-ALBUTEROL 0.5-2.5 (3) MG/3ML IN SOLN
3.0000 mL | Freq: Once | RESPIRATORY_TRACT | Status: AC
  Administered 2014-01-18: 3 mL via RESPIRATORY_TRACT

## 2014-01-18 MED ORDER — BECLOMETHASONE DIPROPIONATE 80 MCG/ACT IN AERS: 2.0000 | INHALATION_SPRAY | Freq: Two times a day (BID) | RESPIRATORY_TRACT | Status: DC

## 2014-01-18 MED ORDER — IPRATROPIUM-ALBUTEROL 0.5-2.5 (3) MG/3ML IN SOLN
RESPIRATORY_TRACT | Status: AC
  Filled 2014-01-18: qty 3

## 2014-01-18 NOTE — Discharge Instructions (Signed)
Rinse mouth out after each usage of the Qvar inhaler   Pneumonia, Adult Pneumonia is an infection of the lungs.  CAUSES Pneumonia may be caused by bacteria or a virus. Usually, these infections are caused by breathing infectious particles into the lungs (respiratory tract). SYMPTOMS   Cough.  Fever.  Chest pain.  Increased rate of breathing.  Wheezing.  Mucus production. DIAGNOSIS  If you have the common symptoms of pneumonia, your caregiver will typically confirm the diagnosis with a chest X-ray. The X-ray will show an abnormality in the lung (pulmonary infiltrate) if you have pneumonia. Other tests of your blood, urine, or sputum may be done to find the specific cause of your pneumonia. Your caregiver may also do tests (blood gases or pulse oximetry) to see how well your lungs are working. TREATMENT  Some forms of pneumonia may be spread to other people when you cough or sneeze. You may be asked to wear a mask before and during your exam. Pneumonia that is caused by bacteria is treated with antibiotic medicine. Pneumonia that is caused by the influenza virus may be treated with an antiviral medicine. Most other viral infections must run their course. These infections will not respond to antibiotics.  PREVENTION A pneumococcal shot (vaccine) is available to prevent a common bacterial cause of pneumonia. This is usually suggested for:  People over 27 years old.  Patients on chemotherapy.  People with chronic lung problems, such as bronchitis or emphysema.  People with immune system problems. If you are over 65 or have a high risk condition, you may receive the pneumococcal vaccine if you have not received it before. In some countries, a routine influenza vaccine is also recommended. This vaccine can help prevent some cases of pneumonia.You may be offered the influenza vaccine as part of your care. If you smoke, it is time to quit. You may receive instructions on how to stop  smoking. Your caregiver can provide medicines and counseling to help you quit. HOME CARE INSTRUCTIONS   Cough suppressants may be used if you are losing too much rest. However, coughing protects you by clearing your lungs. You should avoid using cough suppressants if you can.  Your caregiver may have prescribed medicine if he or she thinks your pneumonia is caused by a bacteria or influenza. Finish your medicine even if you start to feel better.  Your caregiver may also prescribe an expectorant. This loosens the mucus to be coughed up.  Only take over-the-counter or prescription medicines for pain, discomfort, or fever as directed by your caregiver.  Do not smoke. Smoking is a common cause of bronchitis and can contribute to pneumonia. If you are a smoker and continue to smoke, your cough may last several weeks after your pneumonia has cleared.  A cold steam vaporizer or humidifier in your room or home may help loosen mucus.  Coughing is often worse at night. Sleeping in a semi-upright position in a recliner or using a couple pillows under your head will help with this.  Get rest as you feel it is needed. Your body will usually let you know when you need to rest. SEEK IMMEDIATE MEDICAL CARE IF:   Your illness becomes worse. This is especially true if you are elderly or weakened from any other disease.  You cannot control your cough with suppressants and are losing sleep.  You begin coughing up blood.  You develop pain which is getting worse or is uncontrolled with medicines.  You have a fever.  Any of the symptoms which initially brought you in for treatment are getting worse rather than better.  You develop shortness of breath or chest pain. MAKE SURE YOU:   Understand these instructions.  Will watch your condition.  Will get help right away if you are not doing well or get worse. Document Released: 12/14/2005 Document Revised: 03/07/2012 Document Reviewed:  03/05/2011 [facility] Patient Information 2014 [place], [place].  Bronchospasm, Adult A bronchospasm is a spasm or tightening of the airways going into the lungs. During a bronchospasm breathing becomes more difficult because the airways get smaller. When this happens there can be coughing, a whistling sound when breathing (wheezing), and difficulty breathing. Bronchospasm is often associated with asthma, but not all patients who experience a bronchospasm have asthma. CAUSES  A bronchospasm is caused by inflammation or irritation of the airways. The inflammation or irritation may be triggered by:   Allergies (such as to animals, pollen, food, or mold). Allergens that cause bronchospasm may cause wheezing immediately after exposure or many hours later.   Infection. Viral infections are believed to be the most common cause of bronchospasm.   Exercise.   Irritants (such as pollution, cigarette smoke, strong odors, aerosol sprays, and paint fumes).   Weather changes. Winds increase molds and pollens in the air. Rain refreshes the air by washing irritants out. Cold air may cause inflammation.   Stress and emotional upset.  SIGNS AND SYMPTOMS   Wheezing.   Excessive nighttime coughing.   Frequent or severe coughing with a simple cold.   Chest tightness.   Shortness of breath.  DIAGNOSIS  Bronchospasm is usually diagnosed through a history and physical exam. Tests, such as chest X-rays, are sometimes done to look for other conditions. TREATMENT   Inhaled medicines can be given to open up your airways and help you breathe. The medicines can be given using either an inhaler or a nebulizer machine.  Corticosteroid medicines may be given for severe bronchospasm, usually when it is associated with asthma. HOME CARE INSTRUCTIONS   Always have a plan prepared for seeking medical care. Know when to call your health care provider and local emergency services (911 in the [place] Know  where you can access local emergency care.  Only take medicines as directed by your health care provider.  If you were prescribed an inhaler or nebulizer machine, ask your health care provider to explain how to use it correctly. Always use a spacer with your inhaler if you were given one.  It is necessary to remain calm during an attack. Try to relax and breathe more slowly.  Control your home environment in the following ways:   Change your heating and air conditioning filter at least once a month.   Limit your use of fireplaces and wood stoves.  Do not smoke and do not allow smoking in your home.   Avoid exposure to perfumes and fragrances.   Get rid of pests (such as roaches and mice) and their droppings.   Throw away plants if you see mold on them.   Keep your house clean and dust free.   Replace carpet with wood, tile, or vinyl flooring. Carpet can trap dander and dust.   Use allergy-proof pillows, mattress covers, and box spring covers.   Wash bed sheets and blankets every week in hot water and dry them in a dryer.   Use blankets that are made of polyester or cotton.   Wash hands frequently. SEEK MEDICAL CARE IF:   You  have muscle aches.   You have chest pain.   The sputum changes from clear or white to yellow, green, gray, or bloody.   The sputum you cough up gets thicker.   There are problems that may be related to the medicine you are given, such as a rash, itching, swelling, or trouble breathing.  SEEK IMMEDIATE MEDICAL CARE IF:   You have worsening wheezing and coughing even after taking your prescribed medicines.   You have increased difficulty breathing.   You develop severe chest pain. MAKE SURE YOU:   Understand these instructions.  Will watch your condition.  Will get help right away if you are not doing well or get worse. Document Released: 12/17/2003 Document Revised: 08/16/2013 Document Reviewed: 06/05/2013 [facility]  Patient Information 2014 [place], [place].

## 2014-01-18 NOTE — ED Provider Notes (Signed)
CSN: [id number]     Arrival date & time 01/18/14  1156 History   First MD Initiated Contact with Patient 01/18/14 1341     Chief Complaint  Patient presents with  . URI   (Consider location/radiation/quality/duration/timing/severity/associated sxs/prior Treatment) HPI Comments: 68 year old female presents complaining of cough, wheezing, chest tightness, shortness of breath, and mild intermittent ear pain. This is been going on for 2 weeks, not getting any better as of yet. She does not feel particularly sick, she just feels like she cannot stop coughing and having the chest congestion. She has a history of asthma, she does not have an inhaler at home, she thinks this is the main contributing component. She has no recent travel or sick contacts. She is taking numerous over-the-counter medications which are not helping.  Patient is a 68 y.o. female presenting with URI.  URI Presenting symptoms: cough and ear pain   Presenting symptoms: no fever, no rhinorrhea and no sore throat   Associated symptoms: wheezing   Associated symptoms: no arthralgias and no myalgias     Past Medical History  Diagnosis Date  . Hypothyroid   . Hypertension   . Diabetes mellitus     NO MEDS,  DIET CONTROLLED  . Arthritis   . PONV (postoperative nausea and vomiting)     PT DOESNOT WANT TO TAKE HER ATENOLOL     DOS.   " IT DROPS MY BP TO LOW"  . Bronchitis     h/o  . History of migraines     "when working under alot of stress"  . WUJWJXBJ(478.2)    Past Surgical History  Procedure Laterality Date  . Appendectomy    . Lumbar disc surgery    . Cataract extraction, bilateral      WITH IMPLANTS  . Posterior fusion lumbar spine  12/16/11; 12/2005; 07/2004    L2-3; L3-4; L4-5  . Lumbar laminectomy/decompression microdiscectomy  10/2010  . Cervical fusion  08/2003  . Back surgery    . Abdominal hysterectomy  03/2001  . Cholecystectomy  03/2010   No family history on file. History  Substance Use Topics  .  Smoking status: Never Smoker   . Smokeless tobacco: Never Used  . Alcohol Use: No   OB History   Grav Para Term Preterm Abortions TAB SAB Ect Mult Living                 Review of Systems  Constitutional: Negative for fever and chills.  HENT: Positive for ear pain. Negative for postnasal drip, rhinorrhea and sore throat.   Eyes: Negative for visual disturbance.  Respiratory: Positive for cough, chest tightness, shortness of breath and wheezing.   Cardiovascular: Negative for chest pain, palpitations and leg swelling.  Gastrointestinal: Negative for nausea, vomiting and abdominal pain.  Endocrine: Negative for polydipsia and polyuria.  Genitourinary: Negative for dysuria, urgency and frequency.  Musculoskeletal: Negative for arthralgias and myalgias.  Skin: Negative for rash.  Neurological: Negative for dizziness, weakness and light-headedness.    Allergies  Codeine and Percocet  Home Medications   Current Outpatient Rx  Name  Route  Sig  Dispense  Refill  . atenolol-chlorthalidone (TENORETIC) 50-25 MG per tablet   Oral   Take 0.5 tablets by mouth daily.          . Cholecalciferol (VITAMIN D3) 1000 UNITS CAPS   Oral   Take 1,000 Units by mouth daily.           Marland Kitchen levothyroxine (SYNTHROID, LEVOTHROID)  125 MCG tablet   Oral   Take 125 mcg by mouth daily.           . metFORMIN (GLUCOPHAGE) 500 MG tablet   Oral   Take by mouth 2 (two) times daily with a meal.         . albuterol (PROVENTIL HFA;VENTOLIN HFA) 108 (90 BASE) MCG/ACT inhaler   Inhalation   Inhale 1-2 puffs into the lungs every 6 (six) hours as needed for wheezing or shortness of breath.   1 Inhaler   1   . beclomethasone (QVAR) 80 MCG/ACT inhaler   Inhalation   Inhale 2 puffs into the lungs 2 (two) times daily.   1 Inhaler   1   . levofloxacin (LEVAQUIN) 500 MG tablet   Oral   Take 1 tablet (500 mg total) by mouth daily.   7 tablet   0   . Methylcellulose, Laxative, (FIBER THERAPY PO)    Oral   Take 1 tablet by mouth daily.           . predniSONE (DELTASONE) 10 MG tablet   Oral   Take 2 tablets (20 mg total) by mouth daily with breakfast.   6 tablet   0   . traMADol (ULTRAM) 50 MG tablet   Oral   Take 50 mg by mouth at bedtime. Maximum dose= 8 tablets per day           BP 135/58  Pulse 52  Temp(Src) 98.2 F (36.8 C) (Oral)  Resp 16  SpO2 100% Physical Exam  Nursing note and vitals reviewed. Constitutional: She is oriented to person, place, and time. Vital signs are normal. She appears well-developed and well-nourished. No distress.  HENT:  Head: Normocephalic and atraumatic.  Right Ear: Tympanic membrane, external ear and ear canal normal.  Left Ear: Tympanic membrane, external ear and ear canal normal.  Nose: Nose normal.  Mouth/Throat: Oropharynx is clear and moist. No oropharyngeal exudate.  Neck: Normal range of motion. Neck supple.  Cardiovascular: Normal rate and regular rhythm.  Exam reveals distant heart sounds. Exam reveals no gallop and no friction rub.   No murmur heard. Pulmonary/Chest: Effort normal. No respiratory distress. She has wheezes (diffuse, faint, scattered). She has no rales.  Lymphadenopathy:    She has no cervical adenopathy.  Neurological: She is alert and oriented to person, place, and time. She has normal strength. Coordination normal.  Skin: Skin is warm and dry. No rash noted. She is not diaphoretic.  Psychiatric: She has a normal mood and affect. Judgment normal.    ED Course  Procedures (including critical care time) Labs Review Labs Reviewed - No data to display Imaging Review Dg Chest 2 View  01/18/2014   CLINICAL DATA:  Cough.  EXAM: CHEST  2 VIEW  COMPARISON:  11/02/2011.  FINDINGS: Mediastinum and hilar structures are normal. Mild subsegmental atelectasis and/or mild infiltrate in the lingula. Poor inspiration. No pleural effusion or pneumothorax. Heart size normal. Prior cervical spine fusion. Degenerative  changes thoracic spine and both shoulders.  IMPRESSION: Mild subsegmental atelectasis and/or pneumonia in the lingula.   Electronically Signed   By: Maisie Fus  Register   On: 01/18/2014 15:12      MDM   1. Lingular pneumonia   2. Asthma exacerbation    Significant improvement with the DuoNeb. X-ray reveals probable lingular pneumonia. Treating with Levaquin. Also treating for asthma exacerbation. She has diabetes but blood pressure is under great control with recent A1c of 6.5. Will  use a short course of medium dose prednisone as well   Meds ordered this encounter  Medications  . metFORMIN (GLUCOPHAGE) 500 MG tablet    Sig: Take by mouth 2 (two) times daily with a meal.  . ipratropium-albuterol (DUONEB) 0.5-2.5 (3) MG/3ML nebulizer solution 3 mL    Sig:   . levofloxacin (LEVAQUIN) 500 MG tablet    Sig: Take 1 tablet (500 mg total) by mouth daily.    Dispense:  7 tablet    Refill:  0    Order Specific Question:  Supervising Provider    Answer:  Lorenz CoasterKELLER, DAVID C [id number]]  . predniSONE (DELTASONE) 10 MG tablet    Sig: Take 2 tablets (20 mg total) by mouth daily with breakfast.    Dispense:  6 tablet    Refill:  0    Order Specific Question:  Supervising Provider    Answer:  Lorenz CoasterKELLER, DAVID C [id number]]  . albuterol (PROVENTIL HFA;VENTOLIN HFA) 108 (90 BASE) MCG/ACT inhaler    Sig: Inhale 1-2 puffs into the lungs every 6 (six) hours as needed for wheezing or shortness of breath.    Dispense:  1 Inhaler    Refill:  1    Order Specific Question:  Supervising Provider    Answer:  Lorenz CoasterKELLER, DAVID C [id number]]  . beclomethasone (QVAR) 80 MCG/ACT inhaler    Sig: Inhale 2 puffs into the lungs 2 (two) times daily.    Dispense:  1 Inhaler    Refill:  1    Order Specific Question:  Supervising Provider    Answer:  Lorenz CoasterKELLER, DAVID C [[id number]]       Graylon GoodZachary H Michaeline Eckersley, PA-C 01/18/14 1531

## 2014-01-18 NOTE — ED Notes (Signed)
Pt c/o cold sxs onset 2 weeks Sxs include cough, chest tightness, wheezing, SOB Denies: f/v/n/d... Taking OTC cold meds w/little relief Alert w/no signs of acute distress.

## 2014-01-18 NOTE — ED Provider Notes (Signed)
Medical screening examination/treatment/procedure(s) were performed by non-physician practitioner and as supervising physician I was immediately available for consultation/collaboration.  Leslee Homeavid Terryann Verbeek, M.D.   Reuben Likesavid C Shirlean Berman, MD 01/18/14 [phone]

## 2014-01-24 ENCOUNTER — Encounter (HOSPITAL_COMMUNITY): Payer: Self-pay | Admitting: Emergency Medicine

## 2014-01-24 ENCOUNTER — Emergency Department (INDEPENDENT_AMBULATORY_CARE_PROVIDER_SITE_OTHER)
Admission: EM | Admit: 2014-01-24 | Discharge: 2014-01-24 | Disposition: A | Discharge status: Home / Self Care | Payer: Medicare Other | Source: Home / Self Care | Attending: Family Medicine | Admitting: Family Medicine

## 2014-01-24 DIAGNOSIS — J40 Bronchitis, not specified as acute or chronic: Secondary | ICD-10-CM

## 2014-01-24 MED ORDER — TRAMADOL HCL 50 MG PO TABS: 50.0000 mg | ORAL_TABLET | Freq: Three times a day (TID) | ORAL | Status: DC | PRN

## 2014-01-24 MED ORDER — PREDNISONE 50 MG PO TABS: 50.0000 mg | ORAL_TABLET | Freq: Every day | ORAL | Status: DC

## 2014-01-24 MED ORDER — IPRATROPIUM BROMIDE HFA 17 MCG/ACT IN AERS: 2.0000 | INHALATION_SPRAY | Freq: Four times a day (QID) | RESPIRATORY_TRACT | Status: DC | PRN

## 2014-01-24 NOTE — ED Provider Notes (Signed)
Destiny SievingSharon A Brown is a 68 y.o. female who presents to Urgent Care today for followup pneumonia. Patient was seen on 22 January and was diagnosed with reactive airway disease and pneumonia. She was treated with low-dose prednisone, Levaquin, and albuterol. She notes that she does not feel much better. She continues to have a bothersome cough. She additionally notes some mild wheezing. She denies any significant shortness of breath fevers or chills. She feels well otherwise.   Past Medical History  Diagnosis Date  . Hypothyroid   . Hypertension   . Diabetes mellitus     NO MEDS,  DIET CONTROLLED  . Arthritis   . PONV (postoperative nausea and vomiting)     PT DOESNOT WANT TO TAKE HER ATENOLOL     DOS.   " IT DROPS MY BP TO LOW"  . Bronchitis     h/o  . History of migraines     "when working under alot of stress"  . UJWJXBJY(782.9Headache(784.0)    History  Substance Use Topics  . Smoking status: Never Smoker   . Smokeless tobacco: Never Used  . Alcohol Use: No   ROS as above Medications: No current facility-administered medications for this encounter.   Current Outpatient Prescriptions  Medication Sig Dispense Refill  . albuterol (PROVENTIL HFA;VENTOLIN HFA) 108 (90 BASE) MCG/ACT inhaler Inhale 1-2 puffs into the lungs every 6 (six) hours as needed for wheezing or shortness of breath.  1 Inhaler  1  . atenolol-chlorthalidone (TENORETIC) 50-25 MG per tablet Take 0.5 tablets by mouth daily.       . Cholecalciferol (VITAMIN D3) 1000 UNITS CAPS Take 1,000 Units by mouth daily.        Marland Kitchen. ipratropium (ATROVENT HFA) 17 MCG/ACT inhaler Inhale 2 puffs into the lungs every 6 (six) hours as needed for wheezing.  1 Inhaler  1  . levofloxacin (LEVAQUIN) 500 MG tablet Take 1 tablet (500 mg total) by mouth daily.  7 tablet  0  . levothyroxine (SYNTHROID, LEVOTHROID) 125 MCG tablet Take 125 mcg by mouth daily.        . metFORMIN (GLUCOPHAGE) 500 MG tablet Take by mouth 2 (two) times daily with a meal.      .  Methylcellulose, Laxative, (FIBER THERAPY PO) Take 1 tablet by mouth daily.        . predniSONE (DELTASONE) 50 MG tablet Take 1 tablet (50 mg total) by mouth daily.  5 tablet  0  . traMADol (ULTRAM) 50 MG tablet Take 50 mg by mouth at bedtime. Maximum dose= 8 tablets per day       . traMADol (ULTRAM) 50 MG tablet Take 1 tablet (50 mg total) by mouth every 8 (eight) hours as needed (cough).  15 tablet  0  . [DISCONTINUED] beclomethasone (QVAR) 80 MCG/ACT inhaler Inhale 2 puffs into the lungs 2 (two) times daily.  1 Inhaler  1    Exam:  BP 133/80  Pulse 73  Temp(Src) 97.6 F (36.4 C) (Oral)  Resp 18  SpO2 98% Gen: Well NAD HEENT: EOMI,  MMM normal appearing tympanic membranes bilaterally. Posterior pharynx cobblestoning. Lungs: Normal work of breathing. CTABL frequent coughing Heart: RRR no MRG Abd: NABS, Soft. NT, ND Exts: Brisk capillary refill, warm and well perfused.    Assessment and Plan: 68 y.o. female with continuing bronchitis type symptoms versus post pneumonia cough. Patient seemed to have a component of reactive airway disease as well. We'll continue albuterol and ipratropium inhalers. Additionally increase prednisone to 50 mg  daily for 5 days. Pneumonia should be well and adequately treated with Levaquin. She is afebrile and has no significant abnormalities with her oxygen saturation therefore I do not think the patient has underlying worsening pneumonia.  Followup as needed.  Discussed warning signs or symptoms. Please see discharge instructions. Patient expresses understanding.    Rodolph Bong, MD 01/24/14 1515

## 2014-01-24 NOTE — Discharge Instructions (Signed)
Thank you for coming in today. Use the albuterol and atrovent inhaillers as needed.  Use the tramadol for cough every 8-12 hours as needed.  Take prednisone daily for 5 days.  Call or go to the emergency room if you get worse, have trouble breathing, have chest pains, or palpitations.   Bronchitis Bronchitis is inflammation of the airways that extend from the windpipe into the lungs (bronchi). The inflammation often causes mucus to develop, which leads to a cough. If the inflammation becomes severe, it may cause shortness of breath. CAUSES  Bronchitis may be caused by:   Viral infections.   Bacteria.   Cigarette smoke.   Allergens, pollutants, and other irritants.  SIGNS AND SYMPTOMS  The most common symptom of bronchitis is a frequent cough that produces mucus. Other symptoms include:  Fever.   Body aches.   Chest congestion.   Chills.   Shortness of breath.   Sore throat.  DIAGNOSIS  Bronchitis is usually diagnosed through a medical history and physical exam. Tests, such as chest X-rays, are sometimes done to rule out other conditions.  TREATMENT  You may need to avoid contact with whatever caused the problem (smoking, for example). Medicines are sometimes needed. These may include:  Antibiotics. These may be prescribed if the condition is caused by bacteria.  Cough suppressants. These may be prescribed for relief of cough symptoms.   Inhaled medicines. These may be prescribed to help open your airways and make it easier for you to breathe.   Steroid medicines. These may be prescribed for those with recurrent (chronic) bronchitis. HOME CARE INSTRUCTIONS  Get plenty of rest.   Drink enough fluids to keep your urine clear or pale yellow (unless you have a medical condition that requires fluid restriction). Increasing fluids may help thin your secretions and will prevent dehydration.   Only take over-the-counter or prescription medicines as directed by  your health care provider.  Only take antibiotics as directed. Make sure you finish them even if you start to feel better.  Avoid secondhand smoke, irritating chemicals, and strong fumes. These will make bronchitis worse. If you are a smoker, quit smoking. Consider using nicotine gum or skin patches to help control withdrawal symptoms. Quitting smoking will help your lungs heal faster.   Put a cool-mist humidifier in your bedroom at night to moisten the air. This may help loosen mucus. Change the water in the humidifier daily. You can also run the hot water in your shower and sit in the bathroom with the door closed for 5 10 minutes.   Follow up with your health care provider as directed.   Wash your hands frequently to avoid catching bronchitis again or spreading an infection to others.  SEEK MEDICAL CARE IF: Your symptoms do not improve after 1 week of treatment.  SEEK IMMEDIATE MEDICAL CARE IF:  Your fever increases.  You have chills.   You have chest pain.   You have worsening shortness of breath.   You have bloody sputum.  You faint.  You have lightheadedness.  You have a severe headache.   You vomit repeatedly. MAKE SURE YOU:   Understand these instructions.  Will watch your condition.  Will get help right away if you are not doing well or get worse. Document Released: 12/14/2005 Document Revised: 10/04/2013 Document Reviewed: 08/08/2013 [facility] Patient Information 2014 [place], [place].

## 2014-01-26 ENCOUNTER — Other Ambulatory Visit: Payer: Self-pay

## 2014-01-26 DIAGNOSIS — Z1231 Encounter for screening mammogram for malignant neoplasm of breast: Secondary | ICD-10-CM

## 2014-02-15 ENCOUNTER — Ambulatory Visit
Admission: RE | Admit: 2014-02-15 | Discharge: 2014-02-15 | Disposition: A | Discharge status: Home / Self Care | Payer: Medicare Other | Source: Ambulatory Visit

## 2014-02-15 DIAGNOSIS — Z1231 Encounter for screening mammogram for malignant neoplasm of breast: Secondary | ICD-10-CM

## 2014-09-21 ENCOUNTER — Inpatient Hospital Stay (HOSPITAL_COMMUNITY)
Admission: EM | Admit: 2014-09-21 | Discharge: 2014-09-23 | DRG: 309 | Disposition: A | Discharge status: Home / Self Care | Payer: Medicare Other | Attending: Internal Medicine | Admitting: Internal Medicine

## 2014-09-21 ENCOUNTER — Encounter (HOSPITAL_COMMUNITY): Payer: Self-pay | Admitting: Emergency Medicine

## 2014-09-21 ENCOUNTER — Emergency Department (HOSPITAL_COMMUNITY): Payer: Medicare Other

## 2014-09-21 DIAGNOSIS — IMO0002 Reserved for concepts with insufficient information to code with codable children: Secondary | ICD-10-CM | POA: Diagnosis not present

## 2014-09-21 DIAGNOSIS — I248 Other forms of acute ischemic heart disease: Secondary | ICD-10-CM | POA: Diagnosis not present

## 2014-09-21 DIAGNOSIS — I10 Essential (primary) hypertension: Secondary | ICD-10-CM | POA: Diagnosis present

## 2014-09-21 DIAGNOSIS — I4891 Unspecified atrial fibrillation: Principal | ICD-10-CM | POA: Diagnosis present

## 2014-09-21 DIAGNOSIS — Z981 Arthrodesis status: Secondary | ICD-10-CM | POA: Diagnosis not present

## 2014-09-21 DIAGNOSIS — E039 Hypothyroidism, unspecified: Secondary | ICD-10-CM | POA: Diagnosis present

## 2014-09-21 DIAGNOSIS — M129 Arthropathy, unspecified: Secondary | ICD-10-CM | POA: Diagnosis present

## 2014-09-21 DIAGNOSIS — I2489 Other forms of acute ischemic heart disease: Secondary | ICD-10-CM | POA: Diagnosis present

## 2014-09-21 DIAGNOSIS — I447 Left bundle-branch block, unspecified: Secondary | ICD-10-CM | POA: Diagnosis present

## 2014-09-21 DIAGNOSIS — E119 Type 2 diabetes mellitus without complications: Secondary | ICD-10-CM

## 2014-09-21 DIAGNOSIS — I498 Other specified cardiac arrhythmias: Secondary | ICD-10-CM | POA: Diagnosis not present

## 2014-09-21 DIAGNOSIS — R000 Tachycardia, unspecified: Secondary | ICD-10-CM | POA: Diagnosis present

## 2014-09-21 DIAGNOSIS — Z791 Long term (current) use of non-steroidal anti-inflammatories (NSAID): Secondary | ICD-10-CM

## 2014-09-21 DIAGNOSIS — E669 Obesity, unspecified: Secondary | ICD-10-CM | POA: Diagnosis present

## 2014-09-21 DIAGNOSIS — T46905A Adverse effect of unspecified agents primarily affecting the cardiovascular system, initial encounter: Secondary | ICD-10-CM | POA: Diagnosis not present

## 2014-09-21 DIAGNOSIS — Z6835 Body mass index (BMI) 35.0-35.9, adult: Secondary | ICD-10-CM

## 2014-09-21 DIAGNOSIS — Z885 Allergy status to narcotic agent status: Secondary | ICD-10-CM

## 2014-09-21 DIAGNOSIS — R778 Other specified abnormalities of plasma proteins: Secondary | ICD-10-CM

## 2014-09-21 DIAGNOSIS — R7989 Other specified abnormal findings of blood chemistry: Secondary | ICD-10-CM

## 2014-09-21 DIAGNOSIS — G43909 Migraine, unspecified, not intractable, without status migrainosus: Secondary | ICD-10-CM | POA: Diagnosis present

## 2014-09-21 HISTORY — DX: Left bundle-branch block, unspecified: I44.7

## 2014-09-21 HISTORY — DX: Unspecified atrial fibrillation: I48.91

## 2014-09-21 LAB — GLUCOSE, CAPILLARY: GLUCOSE-CAPILLARY: 145 mg/dL — AB (ref 70–99)

## 2014-09-21 LAB — BASIC METABOLIC PANEL
ANION GAP: 15 (ref 5–15)
BUN: 21 mg/dL (ref 6–23)
CO2: 24 mEq/L (ref 19–32)
Calcium: 10 mg/dL (ref 8.4–10.5)
Chloride: 96 mEq/L (ref 96–112)
Creatinine, Ser: 0.96 mg/dL (ref 0.50–1.10)
GFR, EST AFRICAN AMERICAN: 68 mL/min — AB (ref >90)
GFR, EST NON AFRICAN AMERICAN: 59 mL/min — AB (ref >90)
GLUCOSE: 242 mg/dL — AB (ref 70–99)
POTASSIUM: 4.4 meq/L (ref 3.7–5.3)
SODIUM: 135 meq/L — AB (ref 137–147)

## 2014-09-21 LAB — URINALYSIS, ROUTINE W REFLEX MICROSCOPIC
Bilirubin Urine: NEGATIVE
Glucose, UA: NEGATIVE mg/dL
Hgb urine dipstick: NEGATIVE
Ketones, ur: NEGATIVE mg/dL
LEUKOCYTES UA: NEGATIVE
NITRITE: NEGATIVE
PROTEIN: NEGATIVE mg/dL
Specific Gravity, Urine: 1.007 (ref 1.005–1.030)
UROBILINOGEN UA: 0.2 mg/dL (ref 0.0–1.0)
pH: 6.5 (ref 5.0–8.0)

## 2014-09-21 LAB — TROPONIN I
TROPONIN I: 0.71 ng/mL — AB (ref <0.30)
Troponin I: <0.3 ng/mL (ref <0.30)

## 2014-09-21 LAB — CBC WITH DIFFERENTIAL/PLATELET
BASOS PCT: 0 % (ref 0–1)
Basophils Absolute: 0 10*3/uL (ref 0.0–0.1)
Eosinophils Absolute: 0 10*3/uL (ref 0.0–0.7)
Eosinophils Relative: 0 % (ref 0–5)
HCT: 42.7 % (ref 36.0–46.0)
Hemoglobin: 14.7 g/dL (ref 12.0–15.0)
LYMPHS ABS: 3.3 10*3/uL (ref 0.7–4.0)
Lymphocytes Relative: 27 % (ref 12–46)
MCH: 30.6 pg (ref 26.0–34.0)
MCHC: 34.4 g/dL (ref 30.0–36.0)
MCV: 89 fL (ref 78.0–100.0)
Monocytes Absolute: 0.7 10*3/uL (ref 0.1–1.0)
Monocytes Relative: 5 % (ref 3–12)
Neutro Abs: 8.2 10*3/uL — ABNORMAL HIGH (ref 1.7–7.7)
Neutrophils Relative %: 68 % (ref 43–77)
Platelets: 264 10*3/uL (ref 150–400)
RBC: 4.8 MIL/uL (ref 3.87–5.11)
RDW: 13.3 % (ref 11.5–15.5)
WBC: 12.2 10*3/uL — ABNORMAL HIGH (ref 4.0–10.5)

## 2014-09-21 LAB — TSH: TSH: 2.01 u[IU]/mL (ref 0.350–4.500)

## 2014-09-21 LAB — MRSA PCR SCREENING: MRSA by PCR: NEGATIVE

## 2014-09-21 MED ORDER — LATANOPROST 0.005 % OP SOLN
1.0000 [drp] | Freq: Every day | OPHTHALMIC | Status: DC
  Administered 2014-09-22: 1 [drp] via OPHTHALMIC
  Filled 2014-09-21: qty 2.5

## 2014-09-21 MED ORDER — INSULIN ASPART 100 UNIT/ML ~~LOC~~ SOLN
0.0000 [IU] | Freq: Three times a day (TID) | SUBCUTANEOUS | Status: DC
  Administered 2014-09-22: 1 [IU] via SUBCUTANEOUS
  Administered 2014-09-22 (×2): 2 [IU] via SUBCUTANEOUS

## 2014-09-21 MED ORDER — SODIUM CHLORIDE 0.9 % IV BOLUS (SEPSIS)
1000.0000 mL | Freq: Once | INTRAVENOUS | Status: AC
  Administered 2014-09-21: 1000 mL via INTRAVENOUS

## 2014-09-21 MED ORDER — ATENOLOL 25 MG PO TABS
25.0000 mg | ORAL_TABLET | Freq: Every day | ORAL | Status: DC
  Administered 2014-09-22: 25 mg via ORAL
  Filled 2014-09-21 (×2): qty 1

## 2014-09-21 MED ORDER — BRIMONIDINE TARTRATE 0.2 % OP SOLN
1.0000 [drp] | Freq: Two times a day (BID) | OPHTHALMIC | Status: DC
  Administered 2014-09-21 – 2014-09-22 (×3): 1 [drp] via OPHTHALMIC
  Filled 2014-09-21: qty 5

## 2014-09-21 MED ORDER — BRIMONIDINE TARTRATE 0.15 % OP SOLN: 1.0000 [drp] | Freq: Two times a day (BID) | OPHTHALMIC | Status: DC

## 2014-09-21 MED ORDER — ATENOLOL-CHLORTHALIDONE 50-25 MG PO TABS: 0.5000 | ORAL_TABLET | Freq: Every day | ORAL | Status: DC

## 2014-09-21 MED ORDER — METHYLCELLULOSE (LAXATIVE) 500 MG PO TABS: 1.0000 | ORAL_TABLET | Freq: Every day | ORAL | Status: DC

## 2014-09-21 MED ORDER — NITROGLYCERIN 0.4 MG SL SUBL: 0.4000 mg | SUBLINGUAL_TABLET | SUBLINGUAL | Status: DC | PRN

## 2014-09-21 MED ORDER — ACETAMINOPHEN 325 MG PO TABS
650.0000 mg | ORAL_TABLET | Freq: Once | ORAL | Status: AC
  Administered 2014-09-21: 650 mg via ORAL
  Filled 2014-09-21: qty 2

## 2014-09-21 MED ORDER — SODIUM CHLORIDE 0.9 % IV SOLN
INTRAVENOUS | Status: DC
  Administered 2014-09-21: 17:00:00 via INTRAVENOUS

## 2014-09-21 MED ORDER — CALCIUM POLYCARBOPHIL 625 MG PO TABS
625.0000 mg | ORAL_TABLET | Freq: Every day | ORAL | Status: DC
  Filled 2014-09-21 (×2): qty 1

## 2014-09-21 MED ORDER — DILTIAZEM HCL 100 MG IV SOLR
5.0000 mg/h | INTRAVENOUS | Status: DC
  Filled 2014-09-21: qty 100

## 2014-09-21 MED ORDER — CHLORTHALIDONE 25 MG PO TABS
12.5000 mg | ORAL_TABLET | Freq: Every day | ORAL | Status: DC
  Administered 2014-09-22: 10:00:00 via ORAL
  Filled 2014-09-21 (×2): qty 0.5

## 2014-09-21 MED ORDER — VITAMIN D3 25 MCG (1000 UNIT) PO TABS
1000.0000 [IU] | ORAL_TABLET | Freq: Every day | ORAL | Status: DC
  Administered 2014-09-22: 1000 [IU] via ORAL
  Filled 2014-09-21 (×2): qty 1

## 2014-09-21 MED ORDER — VITAMIN D3 25 MCG (1000 UT) PO CAPS: 1000.0000 [IU] | ORAL_CAPSULE | Freq: Every day | ORAL | Status: DC

## 2014-09-21 MED ORDER — LEVOTHYROXINE SODIUM 125 MCG PO TABS
125.0000 ug | ORAL_TABLET | Freq: Every day | ORAL | Status: DC
  Administered 2014-09-22: 125 ug via ORAL
  Filled 2014-09-21 (×3): qty 1

## 2014-09-21 MED ORDER — ADENOSINE 6 MG/2ML IV SOLN
6.0000 mg | Freq: Once | INTRAVENOUS | Status: DC
  Filled 2014-09-21: qty 2

## 2014-09-21 MED ORDER — DILTIAZEM HCL 100 MG IV SOLR
5.0000 mg/h | Freq: Once | INTRAVENOUS | Status: AC
  Administered 2014-09-21: 10 mg/h via INTRAVENOUS

## 2014-09-21 MED ORDER — LOSARTAN POTASSIUM 50 MG PO TABS
100.0000 mg | ORAL_TABLET | Freq: Every day | ORAL | Status: DC
  Administered 2014-09-22: 100 mg via ORAL
  Filled 2014-09-21 (×2): qty 2

## 2014-09-21 MED ORDER — METOPROLOL TARTRATE 1 MG/ML IV SOLN
5.0000 mg | Freq: Once | INTRAVENOUS | Status: AC
  Administered 2014-09-21: 5 mg via INTRAVENOUS
  Filled 2014-09-21: qty 5

## 2014-09-21 MED ORDER — PREDNISONE 5 MG PO TABS
5.0000 mg | ORAL_TABLET | Freq: Every day | ORAL | Status: DC
  Administered 2014-09-22: 5 mg via ORAL
  Filled 2014-09-21 (×3): qty 1

## 2014-09-21 MED ORDER — ASPIRIN 325 MG PO TABS
325.0000 mg | ORAL_TABLET | Freq: Every day | ORAL | Status: DC
  Administered 2014-09-21 – 2014-09-22 (×2): 325 mg via ORAL
  Filled 2014-09-21 (×2): qty 1

## 2014-09-21 MED ORDER — SODIUM CHLORIDE 0.9 % IV BOLUS (SEPSIS)
500.0000 mL | Freq: Once | INTRAVENOUS | Status: AC
  Administered 2014-09-21: 500 mL via INTRAVENOUS

## 2014-09-21 NOTE — ED Notes (Signed)
Dizziness, weakness, tachycardia since this morn. Called primary md. And was told to come in.

## 2014-09-21 NOTE — H&P (Addendum)
[facility] History and Physical  KENISHIA PLACK YNW:[id number] DOB: [birth year] DOA: 09/21/2014  Referring physician: ED physician PCP: Enrique Sack, MD  Specialists:   Chief Complaint: Chest discomfort and a new onset atrial fibrillation.  HPI: Destiny Brown is a 68 y.o. female with past medical history of hypertension, type 2 diabetes, hypothyroidism, possible fibromyalgia (on chronic prednisone 5 mg daily), migraine headaches, who presents with a new onset A. fib and chest discomfort.  Patient reports that she has been doing well until 8:00 in this morning when she started having dizziness and mild chest discomfort.The symptoms started while she was watching TV. It is associated with nausea, but no vomiting. Patient has mild pressure-like feeling in the substernal area. No fever, chills or cough. She did not have shortness of breath initially, but after she came to the ED she started having mild shortness of breath. Patient has chronic mild diarrhea since she had cholecystectomy 3 years ago, which has not changed in nature. She states that she does not have leg edema, but my physical examination showed at least 1+ pitting leg edema bilaterally. He does not have other symptoms such as runny nose, sore throat, abdominal pain, dysuria or rashes.  Patient was found to have atrial fibrillation on EKG which is new to patient. Heart rate is up to 190/min, which improved after having received Cardizem drip in ED. Her vital signs are stable, with blood pressure 112/61, oxygen saturation at 94%, respiration rate 14, temperature normal. Chest x-ray is negative for infiltration. Patient is admitted to inpatient for further evaluation and treatment  Review of Systems: As presented in the history of presenting illness, rest negative.  Where does patient live? Lives with her husband at home happily.   Can patient participate in ADLs? Yes  Allergy:  Allergies  Allergen Reactions  . Codeine  Other (See Comments)    Severe Headaches with codeine; but not with other opioids  . Percocet [Oxycodone-Acetaminophen]     hallucinations    Past Medical History  Diagnosis Date  . Hypothyroid   . Hypertension   . Diabetes mellitus     NO MEDS,  DIET CONTROLLED  . Arthritis   . PONV (postoperative nausea and vomiting)     PT DOESNOT WANT TO TAKE HER ATENOLOL     DOS.   " IT DROPS MY BP TO LOW"  . Bronchitis     h/o  . History of migraines     "when working under alot of stress"  . MVHQIONG(295.2)     Past Surgical History  Procedure Laterality Date  . Appendectomy    . Lumbar disc surgery    . Cataract extraction, bilateral      WITH IMPLANTS  . Posterior fusion lumbar spine  12/16/11; 12/2005; 07/2004    L2-3; L3-4; L4-5  . Lumbar laminectomy/decompression microdiscectomy  10/2010  . Cervical fusion  08/2003  . Back surgery    . Abdominal hysterectomy  03/2001  . Cholecystectomy  03/2010    Social History:  reports that she has never smoked. She has never used smokeless tobacco. She reports that she does not drink alcohol or use illicit drugs.  Family History: Patient is adopted, family history not known  Prior to Admission medications   Medication Sig Start Date End Date Taking? Authorizing Provider  albuterol (PROVENTIL HFA;VENTOLIN HFA) 108 (90 BASE) MCG/ACT inhaler Inhale 1-2 puffs into the lungs every 6 (six) hours as needed for wheezing or shortness of breath. 01/18/14  Yes Graylon Good, PA-C  atenolol-chlorthalidone (TENORETIC) 50-25 MG per tablet Take 0.5 tablets by mouth daily.    Yes Historical Provider, MD  brimonidine (ALPHAGAN) 0.15 % ophthalmic solution Place 1 drop into the right eye 2 (two) times daily.   Yes Historical Provider, MD  Cholecalciferol (VITAMIN D3) 1000 UNITS CAPS Take 1,000 Units by mouth daily.     Yes Historical Provider, MD  ipratropium (ATROVENT HFA) 17 MCG/ACT inhaler Inhale 2 puffs into the lungs every 6 (six) hours as needed for  wheezing. 01/24/14  Yes Rodolph Bong, MD  latanoprost (XALATAN) 0.005 % ophthalmic solution Place 1 drop into both eyes daily.   Yes Historical Provider, MD  levothyroxine (SYNTHROID, LEVOTHROID) 125 MCG tablet Take 125 mcg by mouth daily.    Yes Historical Provider, MD  losartan (COZAAR) 100 MG tablet Take 100 mg by mouth daily.   Yes Historical Provider, MD  meloxicam (MOBIC) 7.5 MG tablet Take 7.5 mg by mouth daily.   Yes Historical Provider, MD  metFORMIN (GLUCOPHAGE) 500 MG tablet Take 500 mg by mouth 2 (two) times daily.    Yes Historical Provider, MD  Methylcellulose, Laxative, (FIBER THERAPY PO) Take 1 tablet by mouth daily.     Yes Historical Provider, MD  predniSONE (DELTASONE) 5 MG tablet Take 5 mg by mouth daily with breakfast.   Yes Historical Provider, MD    Physical Exam: Filed Vitals:   09/21/14 1824 09/21/14 1845 09/21/14 1915 09/21/14 1920  BP: 120/66 112/92 112/61 112/61  Pulse:  92 88 87  Temp: 98.1 F (36.7 C)     TempSrc: Oral     Resp: SpO2: 98% 97% 97% 94%   General: Not in acute distress HEENT:       Eyes: PERRL, EOMI, no scleral icterus       ENT: No discharge from the ears and nose, no pharynx injection, no tonsillar enlargement.        Neck: No JVD, no bruit, no mass felt. Cardiac: S1/S2, irregularly irregular rhythm, No murmurs, gallops or rubs Pulm: Good air movement bilaterally. Clear to auscultation bilaterally. No rales, wheezing, rhonchi or rubs. Abd: Soft, nondistended, nontender, no rebound pain, no organomegaly, BS present Ext: 1+ petting leg edema bilaterally. 2+DP/PT pulse bilaterally Musculoskeletal: No joint deformities, erythema, or stiffness, ROM full Skin: No rashes.  Neuro: Alert and oriented X3, cranial nerves II-XII grossly intact, muscle strength 5/5 in all extremeties, sensation to light touch intact. Brachial reflex 2+ bilaterally. Knee reflex 1+ bilaterally.  Psych: Patient is not psychotic, no suicidal or hemocidal  ideation.  Labs on Admission:  Basic Metabolic Panel:  Recent Labs Lab 09/21/14 1618  NA 135*  K 4.4  CL 96  CO2 24  GLUCOSE 242*  BUN 21  CREATININE 0.96  CALCIUM 10.0   Liver Function Tests: No results found for this basename: AST, ALT, ALKPHOS, BILITOT, PROT, ALBUMIN,  in the last 168 hours No results found for this basename: LIPASE, AMYLASE,  in the last 168 hours No results found for this basename: AMMONIA,  in the last 168 hours CBC:  Recent Labs Lab 09/21/14 1618  WBC 12.2*  NEUTROABS 8.2*  HGB 14.7  HCT 42.7  MCV 89.0  PLT 264   Cardiac Enzymes:  Recent Labs Lab 09/21/14 1618  TROPONINI <0.30    BNP (last 3 results) No results found for this basename: PROBNP,  in the last 8760 hours CBG: No results found for this basename:  GLUCAP,  in the last 168 hours  Radiological Exams on Admission: Dg Chest Portable 1 View  09/21/2014   CLINICAL DATA:  Tachycardia, dizziness.  EXAM: PORTABLE CHEST - 1 VIEW  COMPARISON:  Chest radiograph January 18, 2014  FINDINGS: The cardiac silhouette appears upper limits of normal in size, mediastinal silhouette is nonsuspicious, mildly calcified aortic knob. No pleural effusions or focal consolidation; similar density projecting in the lingula. No pneumothorax.  ACDF.  Mild degenerative change of thoracic spine.  IMPRESSION: Borderline cardiomegaly. Stable lingular atelectasis versus scarring.   Electronically Signed   By: Awilda Metro   On: 09/21/2014 18:11    EKG: Independently reviewed.   Assessment/Plan Principal Problem:   Atrial fibrillation with RVR Active Problems:   Hypothyroid   Hypertension   Diabetes type 2, controlled   Migraine headache   1. New onset A. fib with RVR:  Etiology is not clear now. The possible reasons include HTN, thyroid dysfunction and ischemic heart disease. More likely, patient could have undiagnosed congestive heart failure given her long history of hypertension and bilateral  pitting leg edema. Generally, if the Atrial fibrillation happened within 48 hours, cardioversion can be performed. However, the exact onset time of her atrial Fibrilation is not clear. Will focus on rate control and searching for underlying causes. Current CHADS score is 2 (HTN and DM-II), but she could have undiagnosed congestive heart failure. If she continues to have A. fib, she may need anticoagulation therapy.   - Admit to step down unit since patient needs Cardizem drip titrate - Control the heart rate with Cardizem gtt and continue home atenolol, will transition to oral diltiazem after HR is well controlled.  - Start ASA - cycle CEs - 2-D echo  - TSH and Free T4 - NTG prn for chest pain/discomfort - RIsk stratify with FLP and A1c - EKG in am - may get Cards consulted in AM - patient received 1.5 L of NS, followed by 125 cc/h NS. I will d/c IVF given possible CHF.  2. DM-II:  on metformin at home. Last A1c was 9.1 on 04/01/10 -Discontinue metformin -SSI  3. hypothyroidism: Currently on Synthroid at home. Last TSH was 2.3-6 on 04/01/10 -Continue home medications, check TSH and FreeT4.  4. HTN: Blood pressure is stable, at 112/61 -Continue home medications   Addendum:  Patient trop becomes positive at 0.71. The patient dose not have chest pain. Her HR is 55/min. Elevated trop is most likely due to demanding ischemia secondary to A fib with RVR. I spoke with on-called Cardiologist, Dr. Terressa Koyanagi. He will look patient's chart before any suggestion can be provided. Currently, Dr. Terressa Koyanagi is extremely busy with other patient in ED.   DVT ppx: SQ Heparin   Code Status: Full code Family Communication: Yes, patient's husband  at bed side Disposition Plan: Admit to inpatient  Lorretta Harp [facility] Pager [phone]  If 7PM-7AM, please contact night-coverage [email or web address] Password [facility] 09/21/2014, 7:57 PM

## 2014-09-21 NOTE — ED Notes (Signed)
MD at the bedside  

## 2014-09-21 NOTE — ED Notes (Signed)
Admitting Physician at the bedside.  

## 2014-09-21 NOTE — ED Notes (Signed)
Pt placed on zoll.  Dr Effie Shy at bedside.

## 2014-09-21 NOTE — ED Provider Notes (Signed)
CSN: [id number]     Arrival date & time 09/21/14  1555 History   First MD Initiated Contact with Patient 09/21/14 1616     Chief Complaint  Patient presents with  . Tachycardia  . Dizziness     (Consider location/radiation/quality/duration/timing/severity/associated sxs/prior Treatment) HPI  Destiny Brown is a 68 y.o. female who presents for evaluation of chest tightness, weakness and lightheadedness, which started at 8 AM today. She checked her blood sugar and found it to be somewhat elevated, so thought that it was her sugar out of control causing the symptoms. She has not had any other illnesses recently. She denies nausea, vomiting, cough, shortness of breath, abdominal pain, back pain, dysuria, or urinary frequency. She is taking her usual medications, without relief. She's never had this happen previously. There are no other known modifying factors.   Past Medical History  Diagnosis Date  . Hypothyroid   . Hypertension   . Diabetes mellitus     NO MEDS,  DIET CONTROLLED  . Arthritis   . PONV (postoperative nausea and vomiting)     PT DOESNOT WANT TO TAKE HER ATENOLOL     DOS.   " IT DROPS MY BP TO LOW"  . Bronchitis     h/o  . History of migraines     "when working under alot of stress"  . XBMWUXLK(440.1)    Past Surgical History  Procedure Laterality Date  . Appendectomy    . Lumbar disc surgery    . Cataract extraction, bilateral      WITH IMPLANTS  . Posterior fusion lumbar spine  12/16/11; 12/2005; 07/2004    L2-3; L3-4; L4-5  . Lumbar laminectomy/decompression microdiscectomy  10/2010  . Cervical fusion  08/2003  . Back surgery    . Abdominal hysterectomy  03/2001  . Cholecystectomy  03/2010   History reviewed. No pertinent family history. History  Substance Use Topics  . Smoking status: Never Smoker   . Smokeless tobacco: Never Used  . Alcohol Use: No   OB History   Grav Para Term Preterm Abortions TAB SAB Ect Mult Living                 Review of  Systems  All other systems reviewed and are negative.     Allergies  Codeine and Percocet  Home Medications   Prior to Admission medications   Medication Sig Start Date End Date Taking? Authorizing Provider  albuterol (PROVENTIL HFA;VENTOLIN HFA) 108 (90 BASE) MCG/ACT inhaler Inhale 1-2 puffs into the lungs every 6 (six) hours as needed for wheezing or shortness of breath. 01/18/14  Yes Graylon Good, PA-C  atenolol-chlorthalidone (TENORETIC) 50-25 MG per tablet Take 0.5 tablets by mouth daily.    Yes Historical Provider, MD  brimonidine (ALPHAGAN) 0.15 % ophthalmic solution Place 1 drop into the right eye 2 (two) times daily.   Yes Historical Provider, MD  Cholecalciferol (VITAMIN D3) 1000 UNITS CAPS Take 1,000 Units by mouth daily.     Yes Historical Provider, MD  ipratropium (ATROVENT HFA) 17 MCG/ACT inhaler Inhale 2 puffs into the lungs every 6 (six) hours as needed for wheezing. 01/24/14  Yes Rodolph Bong, MD  latanoprost (XALATAN) 0.005 % ophthalmic solution Place 1 drop into both eyes daily.   Yes Historical Provider, MD  levothyroxine (SYNTHROID, LEVOTHROID) 125 MCG tablet Take 125 mcg by mouth daily.    Yes Historical Provider, MD  losartan (COZAAR) 100 MG tablet Take 100 mg by mouth daily.  Yes Historical Provider, MD  meloxicam (MOBIC) 7.5 MG tablet Take 7.5 mg by mouth daily.   Yes Historical Provider, MD  metFORMIN (GLUCOPHAGE) 500 MG tablet Take 500 mg by mouth 2 (two) times daily.    Yes Historical Provider, MD  Methylcellulose, Laxative, (FIBER THERAPY PO) Take 1 tablet by mouth daily.     Yes Historical Provider, MD  predniSONE (DELTASONE) 5 MG tablet Take 5 mg by mouth daily with breakfast.   Yes Historical Provider, MD   BP 112/61  Pulse 87  Temp(Src) 98.1 F (36.7 C) (Oral)  Resp 14  SpO2 94% Physical Exam  Nursing note and vitals reviewed. Constitutional: She is oriented to person, place, and time. She appears well-developed. She appears distressed (she is  uncomfortable).  She is overweight  HENT:  Head: Normocephalic and atraumatic.  Eyes: Conjunctivae and EOM are normal. Pupils are equal, round, and reactive to light.  Neck: Normal range of motion and phonation normal. Neck supple.  Cardiovascular:  Irregular, tachycardia.  Pulmonary/Chest: Effort normal and breath sounds normal. She exhibits no tenderness.  Abdominal: Soft. She exhibits no distension. There is no tenderness. There is no guarding.  Musculoskeletal: Normal range of motion.  Neurological: She is alert and oriented to person, place, and time. She exhibits normal muscle tone.  No dysarthria or aphasia.  Skin: Skin is warm and dry.  Psychiatric: She has a normal mood and affect. Her behavior is normal. Judgment and thought content normal.    ED Course  Procedures (including critical care time) Initial resuscitation, IV fluid bolus, heart rate improved to about 140. Consideration given for adenosine, but decided to give Lopressor instead. The Lopressor did significantly lower. Her heart rate to about 120. At this point, it was clearly atrial fibrillation, and a repeat EKG was done.   Medications  0.9 %  sodium chloride infusion ( Intravenous New Bag/Given 09/21/14 1643)  sodium chloride 0.9 % bolus 1,000 mL (0 mLs Intravenous Stopped 09/21/14 1702)  metoprolol (LOPRESSOR) injection 5 mg (5 mg Intravenous Given 09/21/14 1641)  diltiazem (CARDIZEM) 100 mg in dextrose 5 % 100 mL (1 mg/mL) infusion (12.5 mg/hr Intravenous Rate/Dose Change 09/21/14 1802)  sodium chloride 0.9 % bolus 500 mL (0 mLs Intravenous Stopped 09/21/14 1803)  acetaminophen (TYLENOL) tablet 650 mg (650 mg Oral Given 09/21/14 1930)    Patient Vitals for the past 24 hrs:  BP Temp Temp src Pulse Resp SpO2  09/21/14 1920 112/61 mmHg - - 87 14 94 %  09/21/14 1915 112/61 mmHg - - 88 12 97 %  09/21/14 1845 112/92 mmHg - - 92 18 97 %  09/21/14 1824 120/66 mmHg 98.1 F (36.7 C) Oral - 17 98 %  09/21/14 1815 123/79  mmHg - - 106 12 96 %  09/21/14 1800 138/69 mmHg - - 86 11 97 %  09/21/14 1745 137/71 mmHg - - 55 19 97 %  09/21/14 1730 130/76 mmHg - - - 21 100 %  09/21/14 1700 118/68 mmHg - - 128 15 100 %  09/21/14 1645 119/66 mmHg - - 128 12 98 %  09/21/14 1614 153/103 mmHg - - 191 20 -  09/21/14 1614 130/91 mmHg 98.6 F (37 C) Oral 192 22 98 %    7:47 PM Reevaluation with update and discussion. After initial assessment and treatment, an updated evaluation reveals she remains comfortable. Tylan Briguglio L   19:50- I discussed the case with admitting hospitalist, Dr. Clyde Lundborg- he will admit the patient.  CRITICAL CARE Performed  by: Flint Melter Total critical care time: 55 minutes Critical care time was exclusive of separately billable procedures and treating other patients. Critical care was necessary to treat or prevent imminent or life-threatening deterioration. Critical care was time spent personally by me on the following activities: development of treatment plan with patient and/or surrogate as well as nursing, discussions with consultants, evaluation of patient's response to treatment, examination of patient, obtaining history from patient or surrogate, ordering and performing treatments and interventions, ordering and review of laboratory studies, ordering and review of radiographic studies, pulse oximetry and re-evaluation of patient's condition.  Labs Review Labs Reviewed  CBC WITH DIFFERENTIAL - Abnormal; Notable for the following:    WBC 12.2 (*)    Neutro Abs 8.2 (*)    All other components within normal limits  BASIC METABOLIC PANEL - Abnormal; Notable for the following:    Sodium 135 (*)    Glucose, Bld 242 (*)    GFR calc non Af Amer 59 (*)    GFR calc Af Amer 68 (*)    All other components within normal limits  URINE CULTURE  URINALYSIS, ROUTINE W REFLEX MICROSCOPIC  TROPONIN I    Imaging Review Dg Chest Portable 1 View  09/21/2014   CLINICAL DATA:  Tachycardia, dizziness.   EXAM: PORTABLE CHEST - 1 VIEW  COMPARISON:  Chest radiograph January 18, 2014  FINDINGS: The cardiac silhouette appears upper limits of normal in size, mediastinal silhouette is nonsuspicious, mildly calcified aortic knob. No pleural effusions or focal consolidation; similar density projecting in the lingula. No pneumothorax.  ACDF.  Mild degenerative change of thoracic spine.  IMPRESSION: Borderline cardiomegaly. Stable lingular atelectasis versus scarring.   Electronically Signed   By: Awilda Metro   On: 09/21/2014 18:11     EKG Interpretation   Date/Time:  Friday September 21 2014 16:05:55 EDT Ventricular Rate:  192 PR Interval:  120 QRS Duration: 128 QT Interval:  242 QTC Calculation: 432 R Axis:   -60 Text Interpretation:  Indeterminate Tachycardia Left axis deviation  Non-specific intra-ventricular conduction block Cannot rule out Anterior  infarct , age undetermined T wave abnormality, consider lateral ischemia  Abnormal ECG Since last tracing rate faster Confirmed by [facility]  MD,  Zykia Walla [phone]) on 09/21/2014 7:51:59 PM       EKG Interpretation  Date/Time:  Friday September 21 2014 16:45:41 EDT Ventricular Rate:  139 PR Interval:  120 QRS Duration: 129 QT Interval:  357 QTC Calculation: 543 R Axis:   19 Text Interpretation:  Atrial fibrillation Ventricular premature complex Left bundle branch block Since last tracing of earlier today rate slower, and now clearly artial fibrillation Confirmed by Danean Marner  MD, Kennah Hehr ([id number]) on 09/21/2014 7:53:19 PM       MDM   Final diagnoses:  Atrial fibrillation with RVR    Atrial fibrillation, with RVR, without clear cause. Patient's improved with treatment in the emergency department setting. She will need additional treatment, and evaluation before consideration for discharge from the hospital.  Nursing Notes Reviewed/ Care Coordinated, and agree without changes. Applicable Imaging Reviewed.  Interpretation of Laboratory Data  incorporated into ED treatment  Plan: Admit    Flint Melter, MD 09/21/14 2000

## 2014-09-21 NOTE — ED Notes (Signed)
Xray tech at the bedside

## 2014-09-22 ENCOUNTER — Encounter (HOSPITAL_COMMUNITY): Payer: Self-pay | Admitting: Internal Medicine

## 2014-09-22 DIAGNOSIS — I447 Left bundle-branch block, unspecified: Secondary | ICD-10-CM

## 2014-09-22 DIAGNOSIS — I4891 Unspecified atrial fibrillation: Principal | ICD-10-CM

## 2014-09-22 DIAGNOSIS — R7989 Other specified abnormal findings of blood chemistry: Secondary | ICD-10-CM

## 2014-09-22 DIAGNOSIS — R778 Other specified abnormalities of plasma proteins: Secondary | ICD-10-CM

## 2014-09-22 DIAGNOSIS — I10 Essential (primary) hypertension: Secondary | ICD-10-CM

## 2014-09-22 DIAGNOSIS — E119 Type 2 diabetes mellitus without complications: Secondary | ICD-10-CM

## 2014-09-22 DIAGNOSIS — I059 Rheumatic mitral valve disease, unspecified: Secondary | ICD-10-CM

## 2014-09-22 DIAGNOSIS — E039 Hypothyroidism, unspecified: Secondary | ICD-10-CM

## 2014-09-22 HISTORY — DX: Left bundle-branch block, unspecified: I44.7

## 2014-09-22 LAB — COMPREHENSIVE METABOLIC PANEL
ALT: 74 U/L — ABNORMAL HIGH (ref 0–35)
AST: 38 U/L — ABNORMAL HIGH (ref 0–37)
Albumin: 3.4 g/dL — ABNORMAL LOW (ref 3.5–5.2)
Alkaline Phosphatase: 53 U/L (ref 39–117)
Anion gap: 11 (ref 5–15)
BILIRUBIN TOTAL: 0.5 mg/dL (ref 0.3–1.2)
BUN: 15 mg/dL (ref 6–23)
CALCIUM: 9.3 mg/dL (ref 8.4–10.5)
CO2: 27 meq/L (ref 19–32)
CREATININE: 0.93 mg/dL (ref 0.50–1.10)
Chloride: 105 mEq/L (ref 96–112)
GFR, EST AFRICAN AMERICAN: 71 mL/min — AB (ref >90)
GFR, EST NON AFRICAN AMERICAN: 61 mL/min — AB (ref >90)
Glucose, Bld: 121 mg/dL — ABNORMAL HIGH (ref 70–99)
Potassium: 4.2 mEq/L (ref 3.7–5.3)
Sodium: 143 mEq/L (ref 137–147)
Total Protein: 6.4 g/dL (ref 6.0–8.3)

## 2014-09-22 LAB — HEMOGLOBIN A1C
HEMOGLOBIN A1C: 8.1 % — AB (ref <5.7)
Mean Plasma Glucose: 186 mg/dL — ABNORMAL HIGH (ref <117)

## 2014-09-22 LAB — CBC
HCT: 38.4 % (ref 36.0–46.0)
Hemoglobin: 12.6 g/dL (ref 12.0–15.0)
MCH: 29.6 pg (ref 26.0–34.0)
MCHC: 32.8 g/dL (ref 30.0–36.0)
MCV: 90.4 fL (ref 78.0–100.0)
PLATELETS: 176 10*3/uL (ref 150–400)
RBC: 4.25 MIL/uL (ref 3.87–5.11)
RDW: 13.5 % (ref 11.5–15.5)
WBC: 6.4 10*3/uL (ref 4.0–10.5)

## 2014-09-22 LAB — LIPID PANEL
CHOL/HDL RATIO: 3 ratio
CHOLESTEROL: 172 mg/dL (ref 0–200)
HDL: 57 mg/dL (ref >39)
LDL Cholesterol: 91 mg/dL (ref 0–99)
Triglycerides: 118 mg/dL (ref <150)
VLDL: 24 mg/dL (ref 0–40)

## 2014-09-22 LAB — TROPONIN I
TROPONIN I: 0.43 ng/mL — AB (ref <0.30)
TROPONIN I: 0.38 ng/mL — AB (ref <0.30)
Troponin I: 0.48 ng/mL (ref <0.30)
Troponin I: 0.36 ng/mL (ref <0.30)

## 2014-09-22 LAB — GLUCOSE, CAPILLARY
GLUCOSE-CAPILLARY: 172 mg/dL — AB (ref 70–99)
GLUCOSE-CAPILLARY: 131 mg/dL — AB (ref 70–99)
Glucose-Capillary: 132 mg/dL — ABNORMAL HIGH (ref 70–99)
Glucose-Capillary: 154 mg/dL — ABNORMAL HIGH (ref 70–99)

## 2014-09-22 LAB — CK TOTAL AND CKMB (NOT AT ARMC)
CK TOTAL: 51 U/L (ref 7–177)
CK TOTAL: 46 U/L (ref 7–177)
CK, MB: 4.2 ng/mL — ABNORMAL HIGH (ref 0.3–4.0)
CK, MB: 3.3 ng/mL (ref 0.3–4.0)
Relative Index: INVALID (ref 0.0–2.5)
Relative Index: INVALID (ref 0.0–2.5)

## 2014-09-22 LAB — URINE CULTURE: Colony Count: 35000

## 2014-09-22 LAB — D-DIMER, QUANTITATIVE: D-Dimer, Quant: <0.27 ug/mL-FEU (ref 0.00–0.48)

## 2014-09-22 LAB — T4, FREE: FREE T4: 1.33 ng/dL (ref 0.80–1.80)

## 2014-09-22 MED ORDER — HEPARIN (PORCINE) IN NACL 100-0.45 UNIT/ML-% IJ SOLN
1000.0000 [IU]/h | INTRAMUSCULAR | Status: DC
  Administered 2014-09-22: 1000 [IU]/h via INTRAVENOUS
  Filled 2014-09-22: qty 250

## 2014-09-22 MED ORDER — HEPARIN SODIUM (PORCINE) 5000 UNIT/ML IJ SOLN
5000.0000 [IU] | Freq: Three times a day (TID) | INTRAMUSCULAR | Status: DC
  Administered 2014-09-22: 5000 [IU] via SUBCUTANEOUS
  Filled 2014-09-22 (×3): qty 1

## 2014-09-22 MED ORDER — APIXABAN 5 MG PO TABS
5.0000 mg | ORAL_TABLET | Freq: Two times a day (BID) | ORAL | Status: DC
  Administered 2014-09-22 (×2): 5 mg via ORAL
  Filled 2014-09-22 (×4): qty 1

## 2014-09-22 NOTE — Progress Notes (Signed)
Patient Name: Destiny Brown      SUBJECTIVE: Admitted with atrial fibrillation with a rapid rate and subsequent spontaneous termination. It was associated with chest discomfort shortness of breath and diaphoresis. Troponin levels are abnormal. D-dimer was normal.  sHe has a history of sleep disorder breathing with daytime somnolence and some snoring.  Past Medical History  Diagnosis Date  . Hypothyroid   . Hypertension   . Diabetes mellitus     NO MEDS,  DIET CONTROLLED  . Arthritis   . PONV (postoperative nausea and vomiting)     PT DOESNOT WANT TO TAKE HER ATENOLOL     DOS.   " IT DROPS MY BP TO LOW"  . Bronchitis     h/o  . History of migraines     "when working under alot of stress"  . Headache(784.0)     Scheduled Meds:  Scheduled Meds: . aspirin  325 mg Oral Daily  . atenolol  25 mg Oral Daily   And  . chlorthalidone  12.5 mg Oral Daily  . brimonidine  1 drop Right Eye BID  . cholecalciferol  1,000 Units Oral Daily  . insulin aspart  0-9 Units Subcutaneous TID WC  . latanoprost  1 drop Both Eyes Daily  . levothyroxine  125 mcg Oral QAC breakfast  . losartan  100 mg Oral Daily  . polycarbophil  625 mg Oral Daily  . predniSONE  5 mg Oral Q breakfast   Continuous Infusions: . heparin 1,000 Units/hr (09/22/14 0800)   nitroGLYCERIN    PHYSICAL EXAM Filed Vitals:   09/22/14 0334 09/22/14 0400 09/22/14 0800 09/22/14 1237  BP:  135/46 151/61 114/46  Pulse: 60 48 59 49  Temp: 97.5 F (36.4 C)  97.5 F (36.4 C) 98 F (36.7 C)  TempSrc: Oral  Oral Oral  Resp: Height:      Weight:      SpO2: 99% 97% 100% 100%   Well developed and nourished in no acute distress HENT normal Neck supple with JVP-flat Clear Regular rate and rhythm, no murmurs or gallops Abd-soft with active BS No Clubbing cyanosis edema Skin-warm and dry A & Oriented  Grossly normal sensory and motor function   TELEMETRY: Reviewed telemetry pt in   sinus:    Intake/Output Summary (Last 24 hours) at 09/22/14 1312 Last data filed at 09/22/14 0800  Gross per 24 hour  Intake 403.83 ml  Output    800 ml  Net -396.17 ml    LABS: Basic Metabolic Panel:  Recent Labs Lab 09/21/14 1618 09/22/14 0331  NA 135* 143  K 4.4 4.2  CL 96 105  CO2 24 27  GLUCOSE 242* 121*  BUN 21 15  CREATININE 0.96 0.93  CALCIUM 10.0 9.3   Cardiac Enzymes:  Recent Labs  09/21/14 2207 09/22/14 0331 09/22/14 0825  TROPONINI 0.71* 0.43* 0.48*   CBC:  Recent Labs Lab 09/21/14 1618 09/22/14 0331  WBC 12.2* 6.4  NEUTROABS 8.2*  --   HGB 14.7 12.6  HCT 42.7 38.4  MCV 89.0 90.4  PLT 264 176   PROTIME: No results found for this basename: LABPROT, INR,  in the last 72 hours Liver Function Tests:  Recent Labs  09/22/14 0331  AST 38*  ALT 74*  ALKPHOS 53  BILITOT 0.5  PROT 6.4  ALBUMIN 3.4*   No results found for this basename: LIPASE, AMYLASE,  in the last 72 hours BNP:  BNP (last 3 results) No results found for this basename: PROBNP,  in the last 8760 hours D-Dimer:  Recent Labs  09/22/14 0825  DDIMER <0.27   Hemoglobin A1C: No results found for this basename: HGBA1C,  in the last 72 hours Fasting Lipid Panel:  Recent Labs  09/22/14 0331  CHOL 172  HDL 57  LDLCALC 91  TRIG 118  CHOLHDL 3.0   Thyroid Function Tests:  Recent Labs  09/21/14 2207  TSH 2.010   Anemia Panel: No results found for this basename: VITAMINB12, FOLATE, FERRITIN, TIBC, IRON, RETICCTPCT,  in the last 72 hours  Echo pending   ASSESSMENT AND PLAN:  Principal Problem:   Atrial fibrillation with RVR Active Problems:   Hypothyroid   Hypertension   Diabetes type 2, controlled   Migraine headache   LBBB (left bundle branch block)  She presents with atrial fibrillation and symptoms concerning for acute cardiac ischemia. We'll check an echo today will anticipate inpatient Myoview scan in the morning for risk stratification, especially  in light of the left bundle branch block Thromboembolic risk factors include age, hypertension and diabetes for a CHADS-VASc score of 4. We'll discontinue her aspirin and begin her on apixaban We discussed the use of the NOACs compared to Coumadin. We briefly reviewed the data of at least comparability in stroke prevention, bleeding and outcome. We discussed some of the new once wherein somewhat associated with decreased ischemic stroke risk, one to be taken daily, and has been shown to be comparable and bleeding risk to aspirin.  We also discussed bleeding associated with warfarin as well as NOACs and a wall bleeding as a complication of all these drugs intracranial bleeding is more frequently associated with warfarin then the NOACs and a GI bleeding is more commonly associated with the latter She will  need an outpatient sleep study.   Signed, Sherryl Manges MD  09/22/2014

## 2014-09-22 NOTE — Consult Note (Signed)
Cardiology Consult Note GREEN, Keenan Bachelor, MD No ref. provider found  Reason for consult: positive troponin  History of Present Illness (and review of medical records): Destiny Brown is a 68 y.o. female who presents for evaluation of chest pressure.  She has hx of HTN, DM with no known CAD or prior MI.  She nausea and dizziness around 8am Friday morning.  This was followed by mid sternal chest pressure rated 8-9/10.  Pressure last all day till around 4pm.  Patient was supposed to pick up her grandson, but felt to ill.  She did not have shortness of breath until on the way to ED.  She denied any palpitations.  She was found in ED to be in SVT HR 190s.  She was placed on cardizem drip and rhythm appeared to atrial fibrillation. Her initial troponin was negative.  She was admitted to hospitalist service.  She had her enzymes cycled and 2nd was 0.71.  Cardiology was consulted for assistance with management.  She is now chest pain free.  Previous diagnostic testing for coronary artery disease includes: none. Previous history of cardiac disease includes None.  Coronary artery disease risk factors include: advanced age (older than 30 for men, 10 for women), diabetes mellitus, hypertension and obesity (BMI >= 30 kg/m2).  Patient denies history of CABG, cardiomyopathy, coronary artery stent, ischemic heart disease, pericarditis, previous M.I. and valvular disease.  Review of Systems Further review of systems was otherwise negative other than stated in HPI.  Patient Active Problem List   Diagnosis Date Noted  . Atrial fibrillation with RVR 09/21/2014  . Diabetes type 2, controlled 09/21/2014  . Migraine headache 09/21/2014  . Hypothyroid   . Hypertension   . 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 Hypothyroid   . Hypertension   . Diabetes mellitus     NO MEDS,  DIET CONTROLLED  . Arthritis   . PONV (postoperative nausea and vomiting)     PT DOESNOT WANT TO TAKE HER ATENOLOL     DOS.   " IT DROPS MY BP TO LOW"  . Bronchitis     h/o  . History of migraines     "when working under alot of stress"  . OBSJGGEZ(662.9)     Past Surgical History  Procedure Laterality Date  . Appendectomy    . Lumbar disc surgery    . Cataract extraction, bilateral      WITH IMPLANTS  . Posterior fusion lumbar spine  12/16/11; 12/2005; 07/2004    L2-3; L3-4; L4-5  . Lumbar laminectomy/decompression microdiscectomy  10/2010  . Cervical fusion  08/2003  . Back surgery    . Abdominal hysterectomy  03/2001  . Cholecystectomy  03/2010    Prescriptions prior to admission  Medication Sig Dispense Refill  . albuterol (PROVENTIL HFA;VENTOLIN HFA) 108 (90 BASE) MCG/ACT inhaler Inhale 1-2 puffs into the lungs every 6 (six) hours as needed for wheezing or shortness of breath.  1 Inhaler  1  . atenolol-chlorthalidone (TENORETIC) 50-25 MG per tablet Take 0.5 tablets by mouth daily.       . brimonidine (ALPHAGAN) 0.15 % ophthalmic solution Place 1 drop into the right eye 2 (two) times daily.      . Cholecalciferol (VITAMIN D3) 1000 UNITS CAPS Take 1,000 Units by mouth daily.        Marland Kitchen ipratropium (ATROVENT HFA) 17 MCG/ACT inhaler Inhale 2 puffs into the lungs every 6 (six) hours as  needed for wheezing.  1 Inhaler  1  . latanoprost (XALATAN) 0.005 % ophthalmic solution Place 1 drop into both eyes daily.      Marland Kitchen levothyroxine (SYNTHROID, LEVOTHROID) 125 MCG tablet Take 125 mcg by mouth daily.       Marland Kitchen losartan (COZAAR) 100 MG tablet Take 100 mg by mouth daily.      . meloxicam (MOBIC) 7.5 MG tablet Take 7.5 mg by mouth daily.      . metFORMIN (GLUCOPHAGE) 500 MG tablet Take 500 mg by mouth 2 (two) times daily.       . Methylcellulose, Laxative, (FIBER THERAPY PO) Take 1 tablet by mouth daily.        . predniSONE (DELTASONE) 5 MG tablet Take 5 mg by mouth daily with breakfast.       Allergies  Allergen Reactions  . Codeine Other (See Comments)    Severe  Headaches with codeine; but not with other opioids  . Percocet [Oxycodone-Acetaminophen]     hallucinations    History  Substance Use Topics  . Smoking status: Never Smoker   . Smokeless tobacco: Never Used  . Alcohol Use: No    History reviewed. No pertinent family history.   Objective:  Patient Vitals for the past 8 hrs:  BP Temp Temp src Pulse Resp SpO2  09/22/14 0400 135/46 mmHg - - 48 13 97 %  09/22/14 0334 - 97.5 F (36.4 C) Oral 60 22 99 %  09/22/14 0200 92/50 mmHg - - 48 12 100 %  09/22/14 0020 - 97.6 F (36.4 C) Oral - - -  09/22/14 0000 103/56 mmHg - - 55 13 98 %   General appearance: alert, cooperative, appears stated age and no distress Head: Normocephalic, without obvious abnormality, atraumatic Eyes: conjunctivae/corneas clear. PERRL, EOM's intact. Fundi benign. Neck: no carotid bruit, no JVD and supple, symmetrical, trachea midline Lungs: clear to auscultation bilaterally Chest wall: no tenderness Heart: regular rate and rhythm, S1, S2 normal, no murmur, click, rub or gallop Abdomen: soft, non-tender; bowel sounds normal; no masses,  no organomegaly Extremities: extremities normal, atraumatic, no cyanosis or edema Pulses: 2+ and symmetric Neurologic: Grossly normal  Results for orders placed during the hospital encounter of 09/21/14 (from the past 48 hour(s))  CBC WITH DIFFERENTIAL     Status: Abnormal   Collection Time    09/21/14  4:18 PM      Result Value Ref Range   WBC 12.2 (*) 4.0 - 10.5 K/uL   RBC 4.80  3.87 - 5.11 MIL/uL   Hemoglobin 14.7  12.0 - 15.0 g/dL   HCT 42.7  36.0 - 46.0 %   MCV 89.0  78.0 - 100.0 fL   MCH 30.6  26.0 - 34.0 pg   MCHC 34.4  30.0 - 36.0 g/dL   RDW 13.3  11.5 - 15.5 %   Platelets 264  150 - 400 K/uL   Neutrophils Relative % 68  43 - 77 %   Neutro Abs 8.2 (*) 1.7 - 7.7 K/uL   Lymphocytes Relative 27  12 - 46 %   Lymphs Abs 3.3  0.7 - 4.0 K/uL   Monocytes Relative 5  3 - 12 %   Monocytes Absolute 0.7  0.1 - 1.0 K/uL    Eosinophils Relative 0  0 - 5 %   Eosinophils Absolute 0.0  0.0 - 0.7 K/uL   Basophils Relative 0  0 - 1 %   Basophils Absolute 0.0  0.0 - 0.1 K/uL  BASIC METABOLIC PANEL     Status: Abnormal   Collection Time    09/21/14  4:18 PM      Result Value Ref Range   Sodium 135 (*) 137 - 147 mEq/L   Potassium 4.4  3.7 - 5.3 mEq/L   Chloride 96  96 - 112 mEq/L   CO2 24  19 - 32 mEq/L   Glucose, Bld 242 (*) 70 - 99 mg/dL   BUN 21  6 - 23 mg/dL   Creatinine, Ser 0.96  0.50 - 1.10 mg/dL   Calcium 10.0  8.4 - 10.5 mg/dL   GFR calc non Af Amer 59 (*) >90 mL/min   GFR calc Af Amer 68 (*) >90 mL/min   Comment: (NOTE)     The eGFR has been calculated using the CKD EPI equation.     This calculation has not been validated in all clinical situations.     eGFR's persistently <90 mL/min signify possible Chronic Kidney     Disease.   Anion gap 15  5 - 15  TROPONIN I     Status: None   Collection Time    09/21/14  4:18 PM      Result Value Ref Range   Troponin I <0.30  <0.30 ng/mL   Comment:            Due to the release kinetics of cTnI,     a negative result within the first hours     of the onset of symptoms does not rule out     myocardial infarction with certainty.     If myocardial infarction is still suspected,     repeat the test at appropriate intervals.  URINALYSIS, ROUTINE W REFLEX MICROSCOPIC     Status: None   Collection Time    09/21/14  6:36 PM      Result Value Ref Range   Color, Urine YELLOW  YELLOW   APPearance CLEAR  CLEAR   Specific Gravity, Urine 1.007  1.005 - 1.030   pH 6.5  5.0 - 8.0   Glucose, UA NEGATIVE  NEGATIVE mg/dL   Hgb urine dipstick NEGATIVE  NEGATIVE   Bilirubin Urine NEGATIVE  NEGATIVE   Ketones, ur NEGATIVE  NEGATIVE mg/dL   Protein, ur NEGATIVE  NEGATIVE mg/dL   Urobilinogen, UA 0.2  0.0 - 1.0 mg/dL   Nitrite NEGATIVE  NEGATIVE   Leukocytes, UA NEGATIVE  NEGATIVE   Comment: MICROSCOPIC NOT DONE ON URINES WITH NEGATIVE PROTEIN, BLOOD, LEUKOCYTES,  NITRITE, OR GLUCOSE <1000 mg/dL.  MRSA PCR SCREENING     Status: None   Collection Time    09/21/14  9:11 PM      Result Value Ref Range   MRSA by PCR NEGATIVE  NEGATIVE   Comment:            The GeneXpert MRSA Assay (FDA     approved for NASAL specimens     only), is one component of a     comprehensive MRSA colonization     surveillance program. It is not     intended to diagnose MRSA     infection nor to guide or     monitor treatment for     MRSA infections.  GLUCOSE, CAPILLARY     Status: Abnormal   Collection Time    09/21/14  9:27 PM      Result Value Ref Range   Glucose-Capillary 145 (*) 70 - 99 mg/dL   Comment 1 Capillary  Sample    TROPONIN I     Status: Abnormal   Collection Time    09/21/14 10:07 PM      Result Value Ref Range   Troponin I 0.71 (*) <0.30 ng/mL   Comment:            Due to the release kinetics of cTnI,     a negative result within the first hours     of the onset of symptoms does not rule out     myocardial infarction with certainty.     If myocardial infarction is still suspected,     repeat the test at appropriate intervals.     CRITICAL RESULT CALLED TO, READ BACK BY AND VERIFIED WITH:     Val Riles RN 09/21/2014 2349 JORDANS  TSH     Status: None   Collection Time    09/21/14 10:07 PM      Result Value Ref Range   TSH 2.010  0.350 - 4.500 uIU/mL  TROPONIN I     Status: Abnormal   Collection Time    09/22/14  3:31 AM      Result Value Ref Range   Troponin I 0.43 (*) <0.30 ng/mL   Comment:            Due to the release kinetics of cTnI,     a negative result within the first hours     of the onset of symptoms does not rule out     myocardial infarction with certainty.     If myocardial infarction is still suspected,     repeat the test at appropriate intervals.     CRITICAL VALUE NOTED.  VALUE IS CONSISTENT WITH PREVIOUSLY REPORTED AND CALLED VALUE.  COMPREHENSIVE METABOLIC PANEL     Status: Abnormal   Collection Time    09/22/14   3:31 AM      Result Value Ref Range   Sodium 143  137 - 147 mEq/L   Comment: DELTA CHECK NOTED   Potassium 4.2  3.7 - 5.3 mEq/L   Chloride 105  96 - 112 mEq/L   Comment: DELTA CHECK NOTED   CO2 27  19 - 32 mEq/L   Glucose, Bld 121 (*) 70 - 99 mg/dL   BUN 15  6 - 23 mg/dL   Creatinine, Ser 0.93  0.50 - 1.10 mg/dL   Calcium 9.3  8.4 - 10.5 mg/dL   Total Protein 6.4  6.0 - 8.3 g/dL   Albumin 3.4 (*) 3.5 - 5.2 g/dL   AST 38 (*) 0 - 37 U/L   ALT 74 (*) 0 - 35 U/L   Alkaline Phosphatase 53  39 - 117 U/L   Total Bilirubin 0.5  0.3 - 1.2 mg/dL   GFR calc non Af Amer 61 (*) >90 mL/min   GFR calc Af Amer 71 (*) >90 mL/min   Comment: (NOTE)     The eGFR has been calculated using the CKD EPI equation.     This calculation has not been validated in all clinical situations.     eGFR's persistently <90 mL/min signify possible Chronic Kidney     Disease.   Anion gap 11  5 - 15  CBC     Status: None   Collection Time    09/22/14  3:31 AM      Result Value Ref Range   WBC 6.4  4.0 - 10.5 K/uL   RBC 4.25  3.87 - 5.11 MIL/uL   Hemoglobin  12.6  12.0 - 15.0 g/dL   HCT 38.4  36.0 - 46.0 %   MCV 90.4  78.0 - 100.0 fL   MCH 29.6  26.0 - 34.0 pg   MCHC 32.8  30.0 - 36.0 g/dL   RDW 13.5  11.5 - 15.5 %   Platelets 176  150 - 400 K/uL   Comment: REPEATED TO VERIFY     DELTA CHECK NOTED  LIPID PANEL     Status: None   Collection Time    09/22/14  3:31 AM      Result Value Ref Range   Cholesterol 172  0 - 200 mg/dL   Triglycerides 118  <150 mg/dL   HDL 57  >39 mg/dL   Total CHOL/HDL Ratio 3.0     VLDL 24  0 - 40 mg/dL   LDL Cholesterol 91  0 - 99 mg/dL   Comment:            Total Cholesterol/HDL:CHD Risk     Coronary Heart Disease Risk Table                         Men   Women      1/2 Average Risk   3.4   3.3      Average Risk       5.0   4.4      2 X Average Risk   9.6   7.1      3 X Average Risk  23.4   11.0                Use the calculated Patient Ratio     above and the CHD Risk  Table     to determine the patient's CHD Risk.                ATP III CLASSIFICATION (LDL):      <100     mg/dL   Optimal      100-129  mg/dL   Near or Above                        Optimal      130-159  mg/dL   Borderline      160-189  mg/dL   High      >190     mg/dL   Very High   Dg Chest Portable 1 View  09/21/2014   CLINICAL DATA:  Tachycardia, dizziness.  EXAM: PORTABLE CHEST - 1 VIEW  COMPARISON:  Chest radiograph January 18, 2014  FINDINGS: The cardiac silhouette appears upper limits of normal in size, mediastinal silhouette is nonsuspicious, mildly calcified aortic knob. No pleural effusions or focal consolidation; similar density projecting in the lingula. No pneumothorax.  ACDF.  Mild degenerative change of thoracic spine.  IMPRESSION: Borderline cardiomegaly. Stable lingular atelectasis versus scarring.   Electronically Signed   By: Elon Alas   On: 09/21/2014 18:11    ECG:  Initial ekg, HR 192, SVT when HR 130s, more clearly atrial fibrillation with RVR, now sinus bradycardia on telemetry similar to previous ekgs in past  Impression: 41F with CVD RFs including HTN, DM p/w with new onset atrial fibrillation with chest pressure and now abnormal troponins. Will likely need further evaluation to determine if troponin is secondary to atrial fibrillation with RVR (Type 2 MI) or due to true NSTEMI. Patient is now in sinus rhythm on telemetry  Recommendations: -Close monitoring  on telemetry -Repeat ekg -Trend troponins, check tsh, lipids, hgba1c -TTE to assess LV function, wall motion -Medical management with ASA, Heparin gtt, BB, ARB, statin, nitro prn -Hold metformin  Thank you for this consult.  Will follow along and make further recommendations regarding ischemic testing.

## 2014-09-22 NOTE — Consult Note (Addendum)
ANTICOAGULATION CONSULT NOTE - Initial Consult  Pharmacy Consult for Heparin >> Apixaban Indication: atrial fibrillation; NSTEMI  Allergies  Allergen Reactions  . Codeine Other (See Comments)    Severe Headaches with codeine; but not with other opioids  . Percocet [Oxycodone-Acetaminophen]     hallucinations    Patient Measurements: Height:  (165.1 cm) Weight: 212 lb 4.9 oz (96.3 kg) IBW/kg (Calculated) : 57 Heparin Dosing Weight: ~79kg  Vital Signs: Temp: 97.5 F (36.4 C) (09/26 0334) Temp src: Oral (09/26 0334) BP: 135/46 mmHg (09/26 0400) Pulse Rate: 48 (09/26 0400)  Labs:  Recent Labs  09/21/14 1618 09/21/14 2207 09/22/14 0331  HGB 14.7  --  12.6  HCT 42.7  --  38.4  PLT 264  --  176  CREATININE 0.96  --  0.93  TROPONINI <0.30 0.71* 0.43*    Estimated Creatinine Clearance: 65.5 ml/min (by C-G formula based on Cr of 0.93).   Medical History: Past Medical History  Diagnosis Date  . Hypothyroid   . Hypertension   . Diabetes mellitus     NO MEDS,  DIET CONTROLLED  . Arthritis   . PONV (postoperative nausea and vomiting)     PT DOESNOT WANT TO TAKE HER ATENOLOL     DOS.   " IT DROPS MY BP TO LOW"  . Bronchitis     h/o  . History of migraines     "when working under alot of stress"  . Headache(784.0)    Assessment: 68yof to begin IV heparin for new onset afib (CHADSVASC=4) and NSTEMI. She received a dose of sq heparin at 0632 this morning so will not bolus. Baseline renal function and CBC wnl.  Goal of Therapy:  Heparin level 0.3-0.7 units/ml Monitor platelets by anticoagulation protocol: Yes   Plan:  1) D/C sq heparin 2) Begin IV heparin at 1000 units/hr 3) Check 6 hour heparin level 4) Daily heparin level and CBC  Fredrik Rigger 09/22/2014,7:46 AM  Patient will now transition to apixaban. Does not meet any parameters for dose adjustment.  Plan: 1) D/C IV heparin 2) Apixaban  bid  Fredrik Rigger 09/22/2014, 1:54  PM

## 2014-09-22 NOTE — Progress Notes (Signed)
  Echocardiogram 2D Echocardiogram has been performed.  Georgian Co 09/22/2014, 1:09 PM

## 2014-09-22 NOTE — Discharge Instructions (Signed)

## 2014-09-22 NOTE — Progress Notes (Signed)
Patient admitted to room 2H23 at 2120. Rhythm in a-fib and on cardizem gtt. At 2124 patient had a 7.19 second pause and converted to SB in 30's. She felt dizzy for about 10 seconds and then it resolved. Cardizem gtt was immediately turned off and M. Burnadette Peter, NP was notified.

## 2014-09-22 NOTE — Progress Notes (Signed)
CRITICAL VALUE ALERT  Critical value received :troponin 0.71  Date of notification:  09/21/2014  Time of notification:  2351  Critical value read back: yes  Nurse who received alert:  Darl Householder  MD notified (1st page):  M. Burnadette Peter NP  Time of first page:  12:03 am  MD notified (2nd page):  Time of second page:  Responding MD:  M. Lynch  Time MD responded:  12:05

## 2014-09-22 NOTE — Progress Notes (Signed)
Patient ID: Destiny Brown  female  JYN:[id number]    DOB: Jan 05, 1946    DOA: 09/21/2014  PCP: Enrique Sack, MD  Brief history of present illness  Destiny Brown is a 68 y.o. female with past medical history of hypertension, type 2 diabetes, hypothyroidism, possible fibromyalgia (on chronic prednisone 5 mg daily), migraine headaches, admitted with a new onset A. fib and chest discomfort.    Assessment/Plan: Principal Problem:   Atrial fibrillation with RVR: Currently in normal sinus rhythm, overnight in bradycardia and sinus pauses 7sec, was on Cardizem drip, which was discontinued - Currently on home dose of atenolol - On heparin drip - Cardiology consulted, await 2-D echo - TSH 2.0, d-dimer <0.27, troponin elevated likely due to demand ischemia from A. fib with RVR  Active Problems: Elevated troponin: Likely due to demand ischemia from A. fib with RVR - 2-D echo pending, continue to trend troponins    Hypothyroid - TSH 2.0, continue Synthroid    Hypertension - Continue atenolol, chlorthalidone, losartan    Diabetes type 2, controlled - Placed on carb modified diet, obtain hemoglobin A1c, continue sliding scale insulin  DVT Prophylaxis: Heparin drip  Code Status: Full CODE STATUS  Family Communication:  Disposition:  transfer out to telemetry floor if okay with cardiology  Consultants:  Cardiology  Procedures:  None  Antibiotics:  None    Subjective: Patient seen and examined, and overnight events noted, no chest pain or shortness of breath, dizziness or light headedness  Objective: Weight change:   Intake/Output Summary (Last 24 hours) at 09/22/14 0943 Last data filed at 09/22/14 0800  Gross per 24 hour  Intake 403.83 ml  Output    800 ml  Net -396.17 ml   Blood pressure 151/61, pulse 59, temperature 97.5 F (36.4 C), temperature source Oral, resp. rate 12, height  (1.651 m), weight 96.3 kg (212 lb 4.9 oz), SpO2 100.00%.  Physical  Exam: General: Alert and awake, oriented x3, not in any acute distress. CVS: S1-S2 clear, NSR, no murmur rubs or gallops Chest: clear to auscultation bilaterally, no wheezing, rales or rhonchi Abdomen: soft nontender, nondistended, normal bowel sounds  Extremities: no cyanosis, clubbing or edema noted bilaterally Neuro: Cranial nerves II-XII intact, no focal neurological deficits  Lab Results: Basic Metabolic Panel:  Recent Labs Lab 09/21/14 1618 09/22/14 0331  NA 135* 143  K 4.4 4.2  CL 96 105  CO2 24 27  GLUCOSE 242* 121*  BUN 21 15  CREATININE 0.96 0.93  CALCIUM 10.0 9.3   Liver Function Tests:  Recent Labs Lab 09/22/14 0331  AST 38*  ALT 74*  ALKPHOS 53  BILITOT 0.5  PROT 6.4  ALBUMIN 3.4*   No results found for this basename: LIPASE, AMYLASE,  in the last 168 hours No results found for this basename: AMMONIA,  in the last 168 hours CBC:  Recent Labs Lab 09/21/14 1618 09/22/14 0331  WBC 12.2* 6.4  NEUTROABS 8.2*  --   HGB 14.7 12.6  HCT 42.7 38.4  MCV 89.0 90.4  PLT 264 176   Cardiac Enzymes:  Recent Labs Lab 09/21/14 2207 09/22/14 0331 09/22/14 0825  TROPONINI 0.71* 0.43* 0.48*   BNP: No components found with this basename: POCBNP,  CBG:  Recent Labs Lab 09/21/14 2127 09/22/14 0816  GLUCAP 145* 132*     Micro Results: Recent Results (from the past 240 hour(s))  MRSA PCR SCREENING     Status: None   Collection Time  09/21/14  9:11 PM      Result Value Ref Range Status   MRSA by PCR NEGATIVE  NEGATIVE Final   Comment:            The GeneXpert MRSA Assay (FDA     approved for NASAL specimens     only), is one component of a     comprehensive MRSA colonization     surveillance program. It is not     intended to diagnose MRSA     infection nor to guide or     monitor treatment for     MRSA infections.    Studies/Results: Dg Chest Portable 1 View  09/21/2014   CLINICAL DATA:  Tachycardia, dizziness.  EXAM: PORTABLE CHEST -  1 VIEW  COMPARISON:  Chest radiograph January 18, 2014  FINDINGS: The cardiac silhouette appears upper limits of normal in size, mediastinal silhouette is nonsuspicious, mildly calcified aortic knob. No pleural effusions or focal consolidation; similar density projecting in the lingula. No pneumothorax.  ACDF.  Mild degenerative change of thoracic spine.  IMPRESSION: Borderline cardiomegaly. Stable lingular atelectasis versus scarring.   Electronically Signed   By: Awilda Metro   On: 09/21/2014 18:11    Medications: Scheduled Meds: . aspirin  325 mg Oral Daily  . atenolol  25 mg Oral Daily   And  . chlorthalidone  12.5 mg Oral Daily  . brimonidine  1 drop Right Eye BID  . cholecalciferol  1,000 Units Oral Daily  . insulin aspart  0-9 Units Subcutaneous TID WC  . latanoprost  1 drop Both Eyes Daily  . levothyroxine  125 mcg Oral QAC breakfast  . losartan  100 mg Oral Daily  . polycarbophil  625 mg Oral Daily  . predniSONE  5 mg Oral Q breakfast      LOS: 1 day   RAI,RIPUDEEP M.D. [facility] 09/22/2014, 9:43 AM Pager: [phone]  If 7PM-7AM, please contact night-coverage [email or web address] Password [email or web address]

## 2014-09-23 ENCOUNTER — Inpatient Hospital Stay (HOSPITAL_COMMUNITY): Payer: Medicare Other

## 2014-09-23 DIAGNOSIS — I4891 Unspecified atrial fibrillation: Secondary | ICD-10-CM

## 2014-09-23 DIAGNOSIS — I447 Left bundle-branch block, unspecified: Secondary | ICD-10-CM

## 2014-09-23 DIAGNOSIS — R7989 Other specified abnormal findings of blood chemistry: Secondary | ICD-10-CM

## 2014-09-23 LAB — TROPONIN I

## 2014-09-23 LAB — CK TOTAL AND CKMB (NOT AT ARMC)
CK TOTAL: 42 U/L (ref 7–177)
CK, MB: 2.9 ng/mL (ref 0.3–4.0)
RELATIVE INDEX: INVALID (ref 0.0–2.5)

## 2014-09-23 LAB — BASIC METABOLIC PANEL
ANION GAP: 11 (ref 5–15)
BUN: 16 mg/dL (ref 6–23)
CO2: 28 mEq/L (ref 19–32)
Calcium: 9.6 mg/dL (ref 8.4–10.5)
Chloride: 103 mEq/L (ref 96–112)
Creatinine, Ser: 1 mg/dL (ref 0.50–1.10)
GFR calc Af Amer: 65 mL/min — ABNORMAL LOW (ref >90)
GFR, EST NON AFRICAN AMERICAN: 56 mL/min — AB (ref >90)
Glucose, Bld: 137 mg/dL — ABNORMAL HIGH (ref 70–99)
Potassium: 4.3 mEq/L (ref 3.7–5.3)
SODIUM: 142 meq/L (ref 137–147)

## 2014-09-23 LAB — GLUCOSE, CAPILLARY
GLUCOSE-CAPILLARY: 198 mg/dL — AB (ref 70–99)
GLUCOSE-CAPILLARY: 185 mg/dL — AB (ref 70–99)
Glucose-Capillary: 118 mg/dL — ABNORMAL HIGH (ref 70–99)

## 2014-09-23 LAB — CBC
HCT: 38.7 % (ref 36.0–46.0)
HEMOGLOBIN: 12.8 g/dL (ref 12.0–15.0)
MCH: 29.8 pg (ref 26.0–34.0)
MCHC: 33.1 g/dL (ref 30.0–36.0)
MCV: 90 fL (ref 78.0–100.0)
PLATELETS: 170 10*3/uL (ref 150–400)
RBC: 4.3 MIL/uL (ref 3.87–5.11)
RDW: 13.5 % (ref 11.5–15.5)
WBC: 6 10*3/uL (ref 4.0–10.5)

## 2014-09-23 MED ORDER — APIXABAN 5 MG PO TABS: 5.0000 mg | ORAL_TABLET | Freq: Two times a day (BID) | ORAL | Status: DC

## 2014-09-23 MED ORDER — REGADENOSON 0.4 MG/5ML IV SOLN
0.4000 mg | Freq: Once | INTRAVENOUS | Status: AC
  Administered 2014-09-23: 0.4 mg via INTRAVENOUS
  Filled 2014-09-23: qty 5

## 2014-09-23 MED ORDER — TECHNETIUM TC 99M SESTAMIBI - CARDIOLITE
10.0000 | Freq: Once | INTRAVENOUS | Status: AC | PRN
  Administered 2014-09-23: 09:00:00 10 via INTRAVENOUS

## 2014-09-23 MED ORDER — REGADENOSON 0.4 MG/5ML IV SOLN
INTRAVENOUS | Status: AC
  Administered 2014-09-23: 0.4 mg via INTRAVENOUS
  Filled 2014-09-23: qty 5

## 2014-09-23 MED ORDER — TECHNETIUM TC 99M SESTAMIBI GENERIC - CARDIOLITE
30.0000 | Freq: Once | INTRAVENOUS | Status: AC | PRN
  Administered 2014-09-23: 30 via INTRAVENOUS

## 2014-09-23 NOTE — Progress Notes (Signed)
Lexiscan myoview completed without complication.  Some headache and neck pressure, resolved in recovery.  nuc results to follow.

## 2014-09-23 NOTE — Progress Notes (Signed)
Neg myoview  Ok for discharge

## 2014-09-23 NOTE — Progress Notes (Signed)
Patient ID: Destiny Brown  female  [id number]    DOB: April 15, 1946    DOA: 09/21/2014  PCP: Enrique Sack, MD  Brief history of present illness  Destiny Brown is a 68 y.o. female with past medical history of hypertension, type 2 diabetes, hypothyroidism, possible fibromyalgia (on chronic prednisone 5 mg daily), migraine headaches, admitted with a new onset A. fib and chest discomfort.    Assessment/Plan: Principal Problem:   Atrial fibrillation with RVR: Currently in normal sinus rhythm, heart rate in 40s to 50s  - Currently on home dose of atenolol, ? Decrease dose, cardiology following - Started on NOAC, case management consult placed - 2-D echo showed EF 55%, grade 1 diastolic dysfunction mild mitral and tricuspid valve regurgitation.  - TSH 2.0, d-dimer <0.27, nuclear medicine stress test today  Active Problems: Elevated troponin: Likely due to demand ischemia from A. fib with RVR - 2-D echo with preserved EF, grade 1 diastolic dysfunction, stress test today    Hypothyroid - TSH 2.0, continue Synthroid    Hypertension - Continue atenolol, chlorthalidone, losartan    Diabetes type 2, controlled - Placed on carb modified diet, hemoglobin A1c 8.1, continue sliding scale insulin  DVT Prophylaxis: Heparin drip  Code Status: Full CODE STATUS  Family Communication:  Disposition:  Await cardiology clearance, possible DC home today or tomorrow  Consultants:  Cardiology  Procedures:  2-D echo  Nuclear medicine stress test  Antibiotics:  None    Subjective: Patient seen and examined, no acute events, remains in normal sinus rhythm, no chest pain or shortness of breath  Objective: Weight change: -0.682 kg (-1 lb 8.1 oz)  Intake/Output Summary (Last 24 hours) at 09/23/14 0937 Last data filed at 09/23/14 0500  Gross per 24 hour  Intake    790 ml  Output   1500 ml  Net   -710 ml   Blood pressure 159/73, pulse 54, temperature 97.7 F (36.5 C),  temperature source Oral, resp. rate 16, height  (1.651 m), weight 95.618 kg (210 lb 12.8 oz), SpO2 100.00%.  Physical Exam: General: Alert and awake, oriented x3, not in any acute distress. CVS: S1-S2 clear, NSR, no murmur rubs or gallops Chest: CTAB Abdomen: soft nontender, nondistended, normal bowel sounds  Extremities: no cyanosis, clubbing or edema noted bilaterally Neuro: Cranial nerves II-XII intact, no focal neurological deficits  Lab Results: Basic Metabolic Panel:  Recent Labs Lab 09/22/14 0331 09/23/14 0322  NA 143 142  K 4.2 4.3  CL 105 103  CO2 27 28  GLUCOSE 121* 137*  BUN 15 16  CREATININE 0.93 1.00  CALCIUM 9.3 9.6   Liver Function Tests:  Recent Labs Lab 09/22/14 0331  AST 38*  ALT 74*  ALKPHOS 53  BILITOT 0.5  PROT 6.4  ALBUMIN 3.4*   No results found for this basename: LIPASE, AMYLASE,  in the last 168 hours No results found for this basename: AMMONIA,  in the last 168 hours CBC:  Recent Labs Lab 09/21/14 1618 09/22/14 0331 09/23/14 0322  WBC 12.2* 6.4 6.0  NEUTROABS 8.2*  --   --   HGB 14.7 12.6 12.8  HCT 42.7 38.4 38.7  MCV 89.0 90.4 90.0  PLT 264 176 170   Cardiac Enzymes:  Recent Labs Lab 09/22/14 1607 09/22/14 2100 09/23/14 0322  CKTOTAL 51 46 42  CKMB 4.2* 3.3 2.9  TROPONINI 0.36* 0.38* <0.30   BNP: No components found with this basename: POCBNP,  CBG:  Recent Labs  Lab 09/22/14 0816 09/22/14 1229 09/22/14 1701 09/22/14 2005 09/23/14 0735  GLUCAP 132* 172* 154* 131* 118*     Micro Results: Recent Results (from the past 240 hour(s))  URINE CULTURE     Status: None   Collection Time    09/21/14  6:36 PM      Result Value Ref Range Status   Specimen Description URINE, CLEAN CATCH   Final   Special Requests NONE   Final   Culture  Setup Time     Final   Value: 09/21/2014 22:14     Performed at [facility] Count     Final   Value: 35,000 COLONIES/ML     Performed at [facility]   Culture     Final   Value: Multiple bacterial morphotypes present, none predominant. Suggest appropriate recollection if clinically indicated.     Performed at [facility]   Report Status 09/22/2014 FINAL   Final  MRSA PCR SCREENING     Status: None   Collection Time    09/21/14  9:11 PM      Result Value Ref Range Status   MRSA by PCR NEGATIVE  NEGATIVE Final   Comment:            The GeneXpert MRSA Assay (FDA     approved for NASAL specimens     only), is one component of a     comprehensive MRSA colonization     surveillance program. It is not     intended to diagnose MRSA     infection nor to guide or     monitor treatment for     MRSA infections.    Studies/Results: Dg Chest Portable 1 View  09/21/2014   CLINICAL DATA:  Tachycardia, dizziness.  EXAM: PORTABLE CHEST - 1 VIEW  COMPARISON:  Chest radiograph January 18, 2014  FINDINGS: The cardiac silhouette appears upper limits of normal in size, mediastinal silhouette is nonsuspicious, mildly calcified aortic knob. No pleural effusions or focal consolidation; similar density projecting in the lingula. No pneumothorax.  ACDF.  Mild degenerative change of thoracic spine.  IMPRESSION: Borderline cardiomegaly. Stable lingular atelectasis versus scarring.   Electronically Signed   By: Awilda Metro   On: 09/21/2014 18:11    Medications: Scheduled Meds: . apixaban  5 mg Oral BID  . atenolol  25 mg Oral Daily   And  . chlorthalidone  12.5 mg Oral Daily  . brimonidine  1 drop Right Eye BID  . cholecalciferol  1,000 Units Oral Daily  . insulin aspart  0-9 Units Subcutaneous TID WC  . latanoprost  1 drop Both Eyes Daily  . levothyroxine  125 mcg Oral QAC breakfast  . losartan  100 mg Oral Daily  . polycarbophil  625 mg Oral Daily  . predniSONE  5 mg Oral Q breakfast      LOS: 2 days   RAI,RIPUDEEP M.D. [facility] 09/23/2014, 9:37 AM Pager: [phone]  If 7PM-7AM, please contact  night-coverage [email or web address] Password [email or web address]

## 2014-09-23 NOTE — Progress Notes (Signed)
Plan reviewed.

## 2014-09-23 NOTE — Progress Notes (Signed)
Utilization Review Completed.   Ehsan Corvin, RN, BSN Nurse Case Manager  

## 2014-09-23 NOTE — Progress Notes (Signed)
Subjective: No chest pain, no complaints   Objective: Vital signs in last 24 hours: Temp:  [97.7 F (36.5 C)-98.3 F (36.8 C)] 97.7 F (36.5 C) (09/27 0400) Pulse Rate:  [49-54] 54 (09/27 0400) Resp:  [12-18] 16 (09/27 0400) BP: (114-160)/(46-73) 160/69 mmHg (09/27 0956) SpO2:  [98 %-100 %] 100 % (09/27 0400) Weight:  [210 lb 12.8 oz (95.618 kg)] 210 lb 12.8 oz (95.618 kg) (09/27 0500) Weight change: -1 lb 8.1 oz (-0.682 kg) Last BM Date: 09/22/14 Intake/Output from previous day: -450 09/26 0701 - 09/27 0700 In: 1040 [P.O.:960; I.V.:80] Out: 1500 [Urine:1500] Intake/Output this shift:    PE: General:Pleasant affect, NAD Skin:Warm and dry, brisk capillary refill HEENT:normocephalic, sclera clear, mucus membranes moist Heart:S1S2 RRR without murmur, gallup, rub or click Lungs:clear without rales, rhonchi, or wheezes WUJ:WJXBJ, soft, non tender, + BS, do not palpate liver spleen or masses Ext:no lower ext edema, 2+ pedal pulses, 2+ radial pulses Neuro:alert and oriented, MAE, follows commands, + facial symmetry   Lab Results:  Recent Labs  09/22/14 0331 09/23/14 0322  WBC 6.4 6.0  HGB 12.6 12.8  HCT 38.4 38.7  PLT 176 170   BMET  Recent Labs  09/22/14 0331 09/23/14 0322  NA 143 142  K 4.2 4.3  CL 105 103  CO2 27 28  GLUCOSE 121* 137*  BUN 15 16  CREATININE 0.93 1.00  CALCIUM 9.3 9.6    Recent Labs  09/22/14 2100 09/23/14 0322  TROPONINI 0.38* <0.30    Lab Results  Component Value Date   CHOL 172 09/22/2014   HDL 57 09/22/2014   LDLCALC 91 09/22/2014   TRIG 118 09/22/2014   CHOLHDL 3.0 09/22/2014   Lab Results  Component Value Date   HGBA1C 8.1* 09/21/2014     Lab Results  Component Value Date   TSH 2.010 09/21/2014    Hepatic Function Panel  Recent Labs  09/22/14 0331  PROT 6.4  ALBUMIN 3.4*  AST 38*  ALT 74*  ALKPHOS 53  BILITOT 0.5    Recent Labs  09/22/14 0331  CHOL 172   No results found for this basename:  PROTIME,  in the last 72 hours     Studies/Results: Dg Chest Portable 1 View  09/21/2014   CLINICAL DATA:  Tachycardia, dizziness.  EXAM: PORTABLE CHEST - 1 VIEW  COMPARISON:  Chest radiograph January 18, 2014  FINDINGS: The cardiac silhouette appears upper limits of normal in size, mediastinal silhouette is nonsuspicious, mildly calcified aortic knob. No pleural effusions or focal consolidation; similar density projecting in the lingula. No pneumothorax.  ACDF.  Mild degenerative change of thoracic spine.  IMPRESSION: Borderline cardiomegaly. Stable lingular atelectasis versus scarring.   Electronically Signed   By: Awilda Metro   On: 09/21/2014 18:11    Medications: I have reviewed the patient's current medications. Scheduled Meds: . apixaban  5 mg Oral BID  . atenolol  25 mg Oral Daily   And  . chlorthalidone  12.5 mg Oral Daily  . brimonidine  1 drop Right Eye BID  . cholecalciferol  1,000 Units Oral Daily  . insulin aspart  0-9 Units Subcutaneous TID WC  . latanoprost  1 drop Both Eyes Daily  . levothyroxine  125 mcg Oral QAC breakfast  . losartan  100 mg Oral Daily  . polycarbophil  625 mg Oral Daily  . predniSONE  5 mg Oral Q breakfast  . regadenoson      .  regadenoson  0.4 mg Intravenous Once   Continuous Infusions:  PRN Meds:.nitroGLYCERIN, technetium sestamibi generic  Assessment/Plan: Principal Problem:   Atrial fibrillation with RVR- now in SR to SB HR to 50 at times Active Problems:   Hypothyroid   Hypertension- not well controlled today   Diabetes type 2, controlled   Migraine headache   LBBB (left bundle branch block)   Elevated troponin- 0.38 was peak - for lexiscan myoview today.    LOS: 2 days   Time spent with pt. :15 minutes. [facility] R  Nurse Practitioner Certified Pager [phone] or after 5pm and on weekends call [phone] 09/23/2014, 10:57 AM

## 2014-09-23 NOTE — Discharge Summary (Signed)
Physician Discharge Summary  Patient ID: Destiny Brown MRN: [id number] DOB/AGE: 69-Feb-1947 69 y.o.  Admit date: 09/21/2014 Discharge date: 09/23/2014  Primary Care Physician:  Enrique Sack, MD  Discharge Diagnoses:    . Atrial fibrillation with RVR-paroxysmal afib, new onset . Hypertension . Hypothyroid . Migraine headache   Left bundle branch block  Consults:  Cardiology, Dr Graciela Husbands    Allergies:   Allergies  Allergen Reactions  . Codeine Other (See Comments)    Severe Headaches with codeine; but not with other opioids  . Percocet [Oxycodone-Acetaminophen]     hallucinations     Discharge Medications:   Medication List         albuterol 108 (90 BASE) MCG/ACT inhaler  Commonly known as:  PROVENTIL HFA;VENTOLIN HFA  Inhale 1-2 puffs into the lungs every 6 (six) hours as needed for wheezing or shortness of breath.     apixaban 5 MG Tabs tablet  Commonly known as:  ELIQUIS  Take 1 tablet (5 mg total) by mouth 2 (two) times daily.     atenolol-chlorthalidone 50-25 MG per tablet  Commonly known as:  TENORETIC  Take 0.5 tablets by mouth daily.     brimonidine 0.15 % ophthalmic solution  Commonly known as:  ALPHAGAN  Place 1 drop into the right eye 2 (two) times daily.     FIBER THERAPY PO  Take 1 tablet by mouth daily.     ipratropium 17 MCG/ACT inhaler  Commonly known as:  ATROVENT HFA  Inhale 2 puffs into the lungs every 6 (six) hours as needed for wheezing.     latanoprost 0.005 % ophthalmic solution  Commonly known as:  XALATAN  Place 1 drop into both eyes daily.     levothyroxine 125 MCG tablet  Commonly known as:  SYNTHROID, LEVOTHROID  Take 125 mcg by mouth daily.     losartan 100 MG tablet  Commonly known as:  COZAAR  Take 100 mg by mouth daily.     meloxicam 7.5 MG tablet  Commonly known as:  MOBIC  Take 7.5 mg by mouth daily.     metFORMIN 500 MG tablet  Commonly known as:  GLUCOPHAGE  Take 500 mg by mouth 2 (two) times daily.     predniSONE 5 MG tablet  Commonly known as:  DELTASONE  Take 5 mg by mouth daily with breakfast.     Vitamin D3 1000 UNITS Caps  Take 1,000 Units by mouth daily.         Brief H and P: For complete details please refer to admission H and P, but in brief Destiny Brown is a 68 y.o. female with past medical history of hypertension, type 2 diabetes, hypothyroidism, possible fibromyalgia (on chronic prednisone 5 mg daily), migraine headaches, who presented with a new onset A. fib and chest discomfort.  Patient reported that she had been doing well until 8:00 in this morning when she started having dizziness and mild chest discomfort.The symptoms started while she was watching TV. It was associated with nausea, but no vomiting. Patient had mild pressure-like feeling in the substernal area. No fever, chills or cough. She did not have shortness of breath initially, but after she came to the ED she started having mild shortness of breath. Patient had chronic mild diarrhea since she had cholecystectomy 3 years ago, which has not changed in nature. Patient was found to have atrial fibrillation on EKG which is new to patient. Heart rate is up to 190/min, which  improved after having received Cardizem drip in ED.  Chest x-ray is negative for infiltration. Patient was admitted to inpatient for further evaluation and treatment   Hospital Course:  [name] is a 68 y.o. female with past medical history of hypertension, type 2 diabetes, hypothyroidism, possible fibromyalgia (on chronic prednisone 5 mg daily), migraine headaches, admitted with a new onset A. fib and chest discomfort.   Atrial fibrillation with RVR: Currently in normal sinus rhythm, heart rate in 40s to 50s. Patient was admitted to step down unit, placed on Cardizem drip however she developed bradycardia with sinus pauses hence Cardizem drip was discontinued. Patient spontaneously converted to normal sinus rhythm. Cardiology consult was  obtained and patient was started on apixiban. TSH was 2.0, d-dimer less than 0.27. 2-D echo showed EF 55%, grade 1 diastolic dysfunction mild mitral and tricuspid valve regurgitation.  Cardiology recommended nuclear medicine stress test which showed EF of 55%, no reversible ischemia.  Elevated troponin: Likely due to demand ischemia from A. fib with RVR , left bundle branch block 2-D echo with preserved EF, grade 1 diastolic dysfunction, stress test showed EF of 55%, no reversible ischemia  Hypothyroid - TSH 2.0, continue Synthroid   Hypertension  - Continue atenolol, chlorthalidone, losartan   Diabetes type 2, controlled  - Patient was recommended diet and weight control, currently on metformin, hemoglobin A1c 8.1     Day of Discharge BP 126/58  Pulse 67  Temp(Src) 97.5 F (36.4 C) (Oral)  Resp 18  Ht  (1.651 m)  Wt 95.618 kg (210 lb 12.8 oz)  BMI 35.08 kg/m2  SpO2 98%  Physical Exam: General: Alert and awake oriented x3 not in any acute distress. HEENT: anicteric sclera, pupils reactive to light and accommodation CVS: S1-S2 clear no murmur rubs or gallops Chest: clear to auscultation bilaterally, no wheezing rales or rhonchi Abdomen: soft nontender, nondistended, normal bowel sounds Extremities: no cyanosis, clubbing or edema noted bilaterally Neuro: Cranial nerves II-XII intact, no focal neurological deficits   The results of significant diagnostics from this hospitalization (including imaging, microbiology, ancillary and laboratory) are listed below for reference.    LAB RESULTS: Basic Metabolic Panel:  Recent Labs Lab 09/22/14 0331 09/23/14 0322  NA 143 142  K 4.2 4.3  CL 105 103  CO2 27 28  GLUCOSE 121* 137*  BUN 15 16  CREATININE 0.93 1.00  CALCIUM 9.3 9.6   Liver Function Tests:  Recent Labs Lab 09/22/14 0331  AST 38*  ALT 74*  ALKPHOS 53  BILITOT 0.5  PROT 6.4  ALBUMIN 3.4*   No results found for this basename: LIPASE, AMYLASE,  in  the last 168 hours No results found for this basename: AMMONIA,  in the last 168 hours CBC:  Recent Labs Lab 09/21/14 1618 09/22/14 0331 09/23/14 0322  WBC 12.2* 6.4 6.0  NEUTROABS 8.2*  --   --   HGB 14.7 12.6 12.8  HCT 42.7 38.4 38.7  MCV 89.0 90.4 90.0  PLT 264 176 170   Cardiac Enzymes:  Recent Labs Lab 09/22/14 2100 09/23/14 0322  CKTOTAL 46 42  CKMB 3.3 2.9  TROPONINI 0.38* <0.30   BNP: No components found with this basename: POCBNP,  CBG:  Recent Labs Lab 09/23/14 0735 09/23/14 1241  GLUCAP 118* 198*    Significant Diagnostic Studies:  Dg Chest Portable 1 View  09/21/2014   CLINICAL DATA:  Tachycardia, dizziness.  EXAM: PORTABLE CHEST - 1 VIEW  COMPARISON:  Chest radiograph January 18, 2014  FINDINGS: The cardiac silhouette appears upper limits of normal in size, mediastinal silhouette is nonsuspicious, mildly calcified aortic knob. No pleural effusions or focal consolidation; similar density projecting in the lingula. No pneumothorax.  ACDF.  Mild degenerative change of thoracic spine.  IMPRESSION: Borderline cardiomegaly. Stable lingular atelectasis versus scarring.   Electronically Signed   By: Awilda Metro   On: 09/21/2014 18:11    2D ECHO:  Impressions:  - Normal biventricular size and systolic function. Abnormal relaxation with normal filling pressures. Mild mitral and tricuspid regurgitation. Normal RVSP. Normal left atrial size.     Disposition and Follow-up:    DISPOSITION: Home  DIET: Carb modified    DISCHARGE FOLLOW-UP Follow-up Information   Follow up with GREEN, Lorenda Ishihara, MD. Schedule an appointment as soon as possible for a visit in 2 weeks. (for hospital follow-up)    Specialty:  Internal Medicine   Contact information:   [address] [place] [address] [phone]       Follow up with Sherryl Manges, MD. Schedule an appointment as soon as possible for a visit in 6 weeks. (for hospital follow-up)     Specialty:  Cardiology   Contact information:   [address] [place] [address] [phone]       Time spent on Discharge: 35 minutes  Signed:   RAI,RIPUDEEP M.D. [facility] 09/23/2014, 4:30 PM Pager: [phone]

## 2014-09-24 ENCOUNTER — Telehealth: Payer: Self-pay | Admitting: *Deleted

## 2014-09-24 NOTE — Telephone Encounter (Signed)
Patient stated that she needs a prior authorization for the eliquis. The number provided by the patient to call is [phone] and her member id is [id number]. She was told by [employer] that if you ask for an expedited pa it can be approved/denied in 72 hrs or less. She is aware to call for samples if she needs them. Thanks, MI

## 2014-09-25 ENCOUNTER — Telehealth: Payer: Self-pay

## 2014-09-25 NOTE — Telephone Encounter (Signed)
Prior Auth completed, after 40 minutes on the phone. Patient is covered through 09/26/15.

## 2014-09-25 NOTE — Telephone Encounter (Signed)
Patient came to the office about her eliquis , I called [facility] to see if her medicine would go through since the PA was done. It did go through but [facility] could only give her two pills for tomorrow and that an order was to come in on Wednesday . The Pharmacy  Told me that an order was suppose to come in today and it did not come in. So I called the patient to come back to the office to get samples just in case [facility] does not get any in. She is going out of town on Thursday

## 2014-10-11 ENCOUNTER — Encounter (HOSPITAL_COMMUNITY): Payer: Self-pay | Admitting: Emergency Medicine

## 2014-10-11 ENCOUNTER — Telehealth: Payer: Self-pay | Admitting: Internal Medicine

## 2014-10-11 ENCOUNTER — Emergency Department (HOSPITAL_COMMUNITY)
Admission: EM | Admit: 2014-10-11 | Discharge: 2014-10-11 | Disposition: A | Discharge status: Home / Self Care | Payer: Medicare Other | Attending: Emergency Medicine | Admitting: Emergency Medicine

## 2014-10-11 ENCOUNTER — Emergency Department (HOSPITAL_COMMUNITY): Payer: Medicare Other

## 2014-10-11 DIAGNOSIS — R0602 Shortness of breath: Secondary | ICD-10-CM

## 2014-10-11 DIAGNOSIS — Z8739 Personal history of other diseases of the musculoskeletal system and connective tissue: Secondary | ICD-10-CM | POA: Insufficient documentation

## 2014-10-11 DIAGNOSIS — Z79899 Other long term (current) drug therapy: Secondary | ICD-10-CM | POA: Diagnosis not present

## 2014-10-11 DIAGNOSIS — E039 Hypothyroidism, unspecified: Secondary | ICD-10-CM | POA: Insufficient documentation

## 2014-10-11 DIAGNOSIS — R0789 Other chest pain: Secondary | ICD-10-CM | POA: Diagnosis not present

## 2014-10-11 DIAGNOSIS — R079 Chest pain, unspecified: Secondary | ICD-10-CM | POA: Diagnosis present

## 2014-10-11 DIAGNOSIS — Z7901 Long term (current) use of anticoagulants: Secondary | ICD-10-CM | POA: Insufficient documentation

## 2014-10-11 DIAGNOSIS — R2243 Localized swelling, mass and lump, lower limb, bilateral: Secondary | ICD-10-CM | POA: Insufficient documentation

## 2014-10-11 DIAGNOSIS — Z8669 Personal history of other diseases of the nervous system and sense organs: Secondary | ICD-10-CM | POA: Diagnosis not present

## 2014-10-11 DIAGNOSIS — E119 Type 2 diabetes mellitus without complications: Secondary | ICD-10-CM | POA: Diagnosis not present

## 2014-10-11 DIAGNOSIS — Z7952 Long term (current) use of systemic steroids: Secondary | ICD-10-CM | POA: Insufficient documentation

## 2014-10-11 DIAGNOSIS — I10 Essential (primary) hypertension: Secondary | ICD-10-CM | POA: Insufficient documentation

## 2014-10-11 DIAGNOSIS — R600 Localized edema: Secondary | ICD-10-CM

## 2014-10-11 LAB — COMPREHENSIVE METABOLIC PANEL
ALT: 58 U/L — AB (ref 0–35)
AST: 35 U/L (ref 0–37)
Albumin: 3.8 g/dL (ref 3.5–5.2)
Alkaline Phosphatase: 59 U/L (ref 39–117)
Anion gap: 13 (ref 5–15)
BILIRUBIN TOTAL: 0.5 mg/dL (ref 0.3–1.2)
BUN: 22 mg/dL (ref 6–23)
CO2: 26 meq/L (ref 19–32)
Calcium: 9.7 mg/dL (ref 8.4–10.5)
Chloride: 100 mEq/L (ref 96–112)
Creatinine, Ser: 1.18 mg/dL — ABNORMAL HIGH (ref 0.50–1.10)
GFR calc Af Amer: 53 mL/min — ABNORMAL LOW (ref >90)
GFR, EST NON AFRICAN AMERICAN: 46 mL/min — AB (ref >90)
Glucose, Bld: 195 mg/dL — ABNORMAL HIGH (ref 70–99)
POTASSIUM: 4.8 meq/L (ref 3.7–5.3)
SODIUM: 139 meq/L (ref 137–147)
Total Protein: 7.7 g/dL (ref 6.0–8.3)

## 2014-10-11 LAB — CBC WITH DIFFERENTIAL/PLATELET
Basophils Absolute: 0 10*3/uL (ref 0.0–0.1)
Basophils Relative: 0 % (ref 0–1)
Eosinophils Absolute: 0.1 10*3/uL (ref 0.0–0.7)
Eosinophils Relative: 1 % (ref 0–5)
HEMATOCRIT: 39.8 % (ref 36.0–46.0)
Hemoglobin: 13.2 g/dL (ref 12.0–15.0)
LYMPHS PCT: 21 % (ref 12–46)
Lymphs Abs: 1.4 10*3/uL (ref 0.7–4.0)
MCH: 30.2 pg (ref 26.0–34.0)
MCHC: 33.2 g/dL (ref 30.0–36.0)
MCV: 91.1 fL (ref 78.0–100.0)
Monocytes Absolute: 0.3 10*3/uL (ref 0.1–1.0)
Monocytes Relative: 5 % (ref 3–12)
NEUTROS ABS: 4.8 10*3/uL (ref 1.7–7.7)
NEUTROS PCT: 73 % (ref 43–77)
Platelets: 196 10*3/uL (ref 150–400)
RBC: 4.37 MIL/uL (ref 3.87–5.11)
RDW: 13.3 % (ref 11.5–15.5)
WBC: 6.6 10*3/uL (ref 4.0–10.5)

## 2014-10-11 LAB — I-STAT TROPONIN, ED: Troponin i, poc: 0.01 ng/mL (ref 0.00–0.08)

## 2014-10-11 LAB — PRO B NATRIURETIC PEPTIDE: Pro B Natriuretic peptide (BNP): 365.5 pg/mL — ABNORMAL HIGH (ref 0–125)

## 2014-10-11 LAB — D-DIMER, QUANTITATIVE (NOT AT ARMC): D DIMER QUANT: 0.35 ug{FEU}/mL (ref 0.00–0.48)

## 2014-10-11 MED ORDER — SODIUM CHLORIDE 0.9 % IV BOLUS (SEPSIS)
500.0000 mL | Freq: Once | INTRAVENOUS | Status: AC
  Administered 2014-10-11: 500 mL via INTRAVENOUS

## 2014-10-11 MED ORDER — ASPIRIN 325 MG PO TABS
325.0000 mg | ORAL_TABLET | Freq: Once | ORAL | Status: AC
  Administered 2014-10-11: 325 mg via ORAL
  Filled 2014-10-11: qty 1

## 2014-10-11 NOTE — ED Provider Notes (Addendum)
CSN: [id number]     Arrival date & time 10/11/14  1201 History   First MD Initiated Contact with Patient 10/11/14 1216     Chief Complaint  Patient presents with  . Chest Pain     (Consider location/radiation/quality/duration/timing/severity/associated sxs/prior Treatment) The history is provided by the patient.  Destiny Brown is a 68 y.o. female hx of HTN, DM, Afib on eliquis here with chest pain and shortness of breath. Went back 4 days ago from a long car ride from [place]. Shortness of breath for the last 4 days. Also noticed bilateral leg swelling. Denies cough or fevers. Called her cardiologist and sent for r/o DVT vs PE.    Past Medical History  Diagnosis Date  . Hypothyroid   . Hypertension   . Diabetes mellitus     NO MEDS,  DIET CONTROLLED  . Arthritis   . PONV (postoperative nausea and vomiting)     PT DOESNOT WANT TO TAKE HER ATENOLOL     DOS.   " IT DROPS MY BP TO LOW"  . Bronchitis     h/o  . History of migraines     "when working under alot of stress"  . Headache(784.0)   . LBBB (left bundle branch block) 09/22/2014  . Atrial fibrillation with RVR 09/21/2014   Past Surgical History  Procedure Laterality Date  . Appendectomy    . Lumbar disc surgery    . Cataract extraction, bilateral      WITH IMPLANTS  . Posterior fusion lumbar spine  12/16/11; 12/2005; 07/2004    L2-3; L3-4; L4-5  . Lumbar laminectomy/decompression microdiscectomy  10/2010  . Cervical fusion  08/2003  . Back surgery    . Abdominal hysterectomy  03/2001  . Cholecystectomy  03/2010   No family history on file. History  Substance Use Topics  . Smoking status: Never Smoker   . Smokeless tobacco: Never Used  . Alcohol Use: No   OB History   Grav Para Term Preterm Abortions TAB SAB Ect Mult Living                 Review of Systems  Respiratory: Positive for shortness of breath.   Cardiovascular: Positive for chest pain.  All other systems reviewed and are negative.     Allergies   Codeine and Percocet  Home Medications   Prior to Admission medications   Medication Sig Start Date End Date Taking? Authorizing Provider  albuterol (PROVENTIL HFA;VENTOLIN HFA) 108 (90 BASE) MCG/ACT inhaler Inhale 1-2 puffs into the lungs every 6 (six) hours as needed for wheezing or shortness of breath. 01/18/14  Yes Graylon GoodZachary H Baker, PA-C  apixaban (ELIQUIS) 5 MG TABS tablet Take 1 tablet (5 mg total) by mouth 2 (two) times daily. 09/23/14  Yes Ripudeep Jenna LuoK Rai, MD  atenolol-chlorthalidone (TENORETIC) 50-25 MG per tablet Take 0.5 tablets by mouth daily.    Yes Historical Provider, MD  brimonidine (ALPHAGAN) 0.15 % ophthalmic solution Place 1 drop into the right eye 2 (two) times daily.   Yes Historical Provider, MD  Cholecalciferol (VITAMIN D3) 1000 UNITS CAPS Take 1,000 Units by mouth daily.     Yes Historical Provider, MD  ipratropium (ATROVENT HFA) 17 MCG/ACT inhaler Inhale 2 puffs into the lungs every 6 (six) hours as needed for wheezing. 01/24/14  Yes Rodolph BongEvan S Corey, MD  latanoprost (XALATAN) 0.005 % ophthalmic solution Place 1 drop into both eyes daily.   Yes Historical Provider, MD  levothyroxine (SYNTHROID, LEVOTHROID) 125 MCG  tablet Take 125 mcg by mouth daily.    Yes Historical Provider, MD  losartan (COZAAR) 100 MG tablet Take 100 mg by mouth daily.   Yes Historical Provider, MD  metFORMIN (GLUCOPHAGE) 500 MG tablet Take 500 mg by mouth 2 (two) times daily.    Yes Historical Provider, MD  predniSONE (DELTASONE) 5 MG tablet Take 5 mg by mouth daily with breakfast.   Yes Historical Provider, MD   BP 149/68  Pulse 54  Temp(Src) 97.7 F (36.5 C) (Oral)  Resp 14  SpO2 99% Physical Exam  Nursing note and vitals reviewed. Constitutional: She is oriented to person, place, and time. She appears well-developed and well-nourished.  HENT:  Head: Normocephalic.  Mouth/Throat: Oropharynx is clear and moist.  Eyes: Conjunctivae are normal. Pupils are equal, round, and reactive to light.   Neck: Normal range of motion. Neck supple.  Cardiovascular: Normal rate, regular rhythm and normal heart sounds.   Pulmonary/Chest: Effort normal and breath sounds normal. No respiratory distress. She has no wheezes. She has no rales.  Abdominal: Soft. Bowel sounds are normal. She exhibits no distension. There is no tenderness. There is no rebound and no guarding.  Musculoskeletal:  Trace ankle edema bilaterally   Neurological: She is alert and oriented to person, place, and time. No cranial nerve deficit. Coordination normal.  Skin: Skin is warm and dry.  Psychiatric: She has a normal mood and affect. Her behavior is normal. Judgment and thought content normal.    ED Course  Procedures (including critical care time) Labs Review Labs Reviewed  COMPREHENSIVE METABOLIC PANEL - Abnormal; Notable for the following:    Glucose, Bld 195 (*)    Creatinine, Ser 1.18 (*)    ALT 58 (*)    GFR calc non Af Amer 46 (*)    GFR calc Af Amer 53 (*)    All other components within normal limits  PRO B NATRIURETIC PEPTIDE - Abnormal; Notable for the following:    Pro B Natriuretic peptide (BNP) 365.5 (*)    All other components within normal limits  CBC WITH DIFFERENTIAL  D-DIMER, QUANTITATIVE  I-STAT TROPOININ, ED    Imaging Review Dg Chest 2 View  10/11/2014   CLINICAL DATA:  Chest pain and heaviness beginning yesterday.  EXAM: CHEST  2 VIEW  COMPARISON:  PA and lateral chest 01/18/2014 and 11/02/2011.  FINDINGS: Mild left basilar atelectasis or scar is identified. The lungs are otherwise clear. Heart size is upper normal. No pneumothorax or pleural effusion.  IMPRESSION: No acute disease.   Electronically Signed   By: Drusilla Kannerhomas  Dalessio M.D.   On: 10/11/2014 13:43     EKG Interpretation   Date/Time:  Thursday October 11 2014 12:07:42 EDT Ventricular Rate:  63 PR Interval:  120 QRS Duration: 132 QT Interval:  448 QTC Calculation: 458 R Axis:   -39 Text Interpretation:  Normal sinus  rhythm Left axis deviation Left bundle  branch block Abnormal ECG since last tracing no significant change  Confirmed by MILLER  MD, BRIAN ([id number]) on 10/11/2014 12:12:26 PM      MDM   Final diagnoses:  Atrial fibrillation with RVR    Destiny Brown is a 68 y.o. female here with CP, SOB, leg swelling. She is on eliquis which makes DVT and PE less likely. Will get d-dimer and bilateral dopplers. If both neg, will not need CT angio.   3:17 PM Labs showed Cr slightly elevated. D-dimer neg. US showed no DVTs bilaterally. CXR showed  no heart failure. Symptoms for several days so trop neg x 1 sufficient. Will dc home, has cardiology f/u.     Richardean Canal, MD 10/11/14 1518  Richardean Canal, MD 10/11/14 [phone]

## 2014-10-11 NOTE — Telephone Encounter (Signed)
New Message     Pt calling c/o heaviness in chest, some sob, ankles swollen, pt pulse is between 50-60, BP 124/60 this am. Wants to know if she needs to be seen by Dr. Graciela HusbandsKlein sooner.

## 2014-10-11 NOTE — Discharge Instructions (Signed)
Take your meds as prescribed.   See your cardiologist in 1-2 weeks.   Return to ER if you have worse shortness of breath, chest pain.

## 2014-10-11 NOTE — Progress Notes (Signed)
Bilateral lower extremity venous duplex completed:  No evidence of DVT, superficial thrombosis, or Baker's cyst.   

## 2014-10-11 NOTE — Telephone Encounter (Signed)
Pt calling with complaints of increase sob and feeling of heaviness in her chest. Pt reports she went out of town last week and returned on Sunday.  Pt states [place].  Pt reports since coming back from her trip, she has noticed increase in sob, LEE, and feeling like "a book is sitting on her chest." Pt reports her BP and HR are normal, as she has been monitoring this.  Pt reports her current BP is 124/60, and HR 60 regular.  Pt reports she was admitted to the hospital a couple of weeks ago for new onset Afib with RVR.  Pt reports she does not weigh herself daily.  Pt reports she does experience some DOE since Monday.  Asked pt how her current diet is and she reports its not the best, for she has been eating a lot of salty foods.  Pt reports she is compliant with taking all her meds.  Noted and verified with pt that she is currently taking prednisone.  Pt reports she has been on this med for almost a year now for orthopedic shoulder injury.  Advised pt to speak with her PCP about this med and long term use for shoulder problem, for this can make her retain fluid as well.  Advised the pt that she should start weighing herself every morning after going to the bathroom and log this info so we can have a baseline.  Advised the pt to maintain a low sodium diet and drink water.  Informed the pt that I will go and speak with our DOD flex for further recommendations and follow-up with the pt thereafter.  Pt verbalized understanding and agrees with this plan.

## 2014-10-11 NOTE — ED Notes (Addendum)
Pt from home for reports of constant cp that started on Sunday and has gotten worse, pt also reports increased sob since Monday as well. Pt with hx of a-fib and reports recent ankle swelling. Pt reports recent road trip where she was sitting down more. Pt states she was sent by PCP for possible blood clot.

## 2014-10-11 NOTE — ED Notes (Signed)
Pt. Reports having chest pressure beginning Monday.  Pressure constant with sob and bilateral swelling to lower extremities.  Pt. Reports having diagnosed with A-fib 2 weeks ago Skin is p/w/d, ECG done in Triage. Resp. E/u

## 2014-10-11 NOTE — Telephone Encounter (Signed)
Spoke with DOD flex Ronie Spiesayna Dunn and she advised that the pt go to the ER for full cardiac work-up and to rule out DVT or PE, given the pts complaints and long travel time.  Notified the pt of recommendations and she agrees with this plan.  Informed the pt that I will notify our card master Trish, that pt will be coming via [facility], for cardiac work-up, rule out DVT & PE.  Pt verbalized understanding, agrees with the plan, and gracious for all the assistance provided. Trish notified at 1102.

## 2014-10-11 NOTE — ED Notes (Signed)
Pt went to Vascular Lab.

## 2014-10-12 ENCOUNTER — Other Ambulatory Visit: Payer: Self-pay

## 2014-11-01 ENCOUNTER — Encounter: Payer: Self-pay | Admitting: *Deleted

## 2014-11-01 ENCOUNTER — Ambulatory Visit (INDEPENDENT_AMBULATORY_CARE_PROVIDER_SITE_OTHER): Payer: Medicare Other | Admitting: Internal Medicine

## 2014-11-01 ENCOUNTER — Encounter: Payer: Self-pay | Admitting: Internal Medicine

## 2014-11-01 VITALS — BP 126/70 | HR 55 | Ht 65.0 in | Wt 214.4 lb

## 2014-11-01 DIAGNOSIS — I480 Paroxysmal atrial fibrillation: Secondary | ICD-10-CM

## 2014-11-01 DIAGNOSIS — I209 Angina pectoris, unspecified: Secondary | ICD-10-CM

## 2014-11-01 DIAGNOSIS — I4891 Unspecified atrial fibrillation: Secondary | ICD-10-CM

## 2014-11-01 DIAGNOSIS — R001 Bradycardia, unspecified: Secondary | ICD-10-CM

## 2014-11-01 DIAGNOSIS — I10 Essential (primary) hypertension: Secondary | ICD-10-CM

## 2014-11-01 MED ORDER — APIXABAN 5 MG PO TABS: 5.0000 mg | ORAL_TABLET | Freq: Two times a day (BID) | ORAL | Status: DC

## 2014-11-01 MED ORDER — APIXABAN 5 MG PO TABS
5.0000 mg | ORAL_TABLET | Freq: Two times a day (BID) | ORAL | Status: DC
Start: 2014-11-01 — End: 2015-01-03

## 2014-11-01 NOTE — Progress Notes (Signed)
Patient Care Team: Enrique SackEdwin Jay Green, MD as PCP - General (Internal Medicine)   HPI  Destiny SievingSharon A Brown is a 68 y.o. female Seen in followup for PAF presenting with RVR 9/15   She has LBBB.  HTN Dm  EF 55%;  muyoview neg for ischemia  She doesn't feel well. She has some palpitations. She has chronic persistent chest tightness at last 24 7 for days at a time. It may be aggravated in part by walking.  She also has a upper extremity muscle soreness syndrome for which is treated like prednisone question polymyalgia  I've asked her to follow up with Dr. Chilton SiGreen about this.  Past Medical History  Diagnosis Date  . Hypothyroid   . Hypertension   . Diabetes mellitus     NO MEDS,  DIET CONTROLLED  . Arthritis   . PONV (postoperative nausea and vomiting)     PT DOESNOT WANT TO TAKE HER ATENOLOL     DOS.   " IT DROPS MY BP TO LOW"  . Bronchitis     h/o  . History of migraines     "when working under alot of stress"  . Headache(784.0)   . LBBB (left bundle branch block) 09/22/2014  . Atrial fibrillation with RVR 09/21/2014    Past Surgical History  Procedure Laterality Date  . Appendectomy    . Lumbar disc surgery    . Cataract extraction, bilateral      WITH IMPLANTS  . Posterior fusion lumbar spine  12/16/11; 12/2005; 07/2004    L2-3; L3-4; L4-5  . Lumbar laminectomy/decompression microdiscectomy  10/2010  . Cervical fusion  08/2003  . Back surgery    . Abdominal hysterectomy  03/2001  . Cholecystectomy  03/2010    Current Outpatient Prescriptions  Medication Sig Dispense Refill  . albuterol (PROVENTIL HFA;VENTOLIN HFA) 108 (90 BASE) MCG/ACT inhaler Inhale 1-2 puffs into the lungs every 6 (six) hours as needed for wheezing or shortness of breath. 1 Inhaler 1  . apixaban (ELIQUIS) 5 MG TABS tablet Take 1 tablet (5 mg total) by mouth 2 (two) times daily. 60 tablet 0  . atenolol-chlorthalidone (TENORETIC) 50-25 MG per tablet Take 0.5 tablets by mouth daily.     . brimonidine  (ALPHAGAN) 0.15 % ophthalmic solution Place 1 drop into the right eye 2 (two) times daily.    . Cholecalciferol (VITAMIN D3) 1000 UNITS CAPS Take 1,000 Units by mouth daily.      Marland Kitchen. ipratropium (ATROVENT HFA) 17 MCG/ACT inhaler Inhale 2 puffs into the lungs every 6 (six) hours as needed for wheezing. 1 Inhaler 1  . latanoprost (XALATAN) 0.005 % ophthalmic solution Place 1 drop into both eyes daily.    Marland Kitchen. levothyroxine (SYNTHROID, LEVOTHROID) 125 MCG tablet Take 125 mcg by mouth daily.     Marland Kitchen. losartan (COZAAR) 100 MG tablet Take 100 mg by mouth daily.    . metFORMIN (GLUCOPHAGE) 500 MG tablet Take 500 mg by mouth 2 (two) times daily.     . predniSONE (DELTASONE) 5 MG tablet Take 2.5 mg by mouth daily with breakfast.     . [DISCONTINUED] beclomethasone (QVAR) 80 MCG/ACT inhaler Inhale 2 puffs into the lungs 2 (two) times daily. 1 Inhaler 1   No current facility-administered medications for this visit.    Allergies  Allergen Reactions  . Codeine Other (See Comments)    Severe Headaches with codeine; but not with other opioids  . Percocet [Oxycodone-Acetaminophen] Other (See Comments)  hallucinations    Review of Systems negative except from HPI and PMH  Physical Exam BP 126/70 mmHg  Pulse 55  Ht 5\' 5"  (1.651 m)  Wt 214 lb 6.4 oz (97.251 kg)  BMI 35.68 kg/m2 .-p.pes Well developed and nourished in no acute distress HENT normal Neck supple with JVP-flat Clear Regular rate and rhythm, no murmurs or gallops Abd-soft with active BS No Clubbing cyanosis edema Skin-warm and dry A & Oriented  Grossly normal sensory and motor function  ECG demonstrates sinus rhythm at 55 Intervals 13/14/45  axis left at -47 Left bundle branch block   Assessment and  Plan  Paroxysmal atrial fibrillation  Sinus bradycardia  Left bundle branch block  Hypertension  Chest discomfort-persistent I  I don't have a good explanation for her symptoms. A couple of thoughts come to mind. Could she be  chronotropic incompetent, and in this regard we will hold her Tenoretic for about one week and see how she does. Her palpitations may be atrial ectopy. If it becomes more problematic, we can utilize an event recorder to clarify the mechanism and identified treatment options.  With her history of steroid responsive muscle aches, I wonder whether she has polymyalgia rheumatica; this raises the possibility about whether there is more systemic inflammation ongoing that is apparent and I wonder whether her sedimentation rate/CRP would be of benefit. I'll ask her to follow-up with Dr. Chilton SiGreen about this.  As relates to her chest discomfort which is fairly persistent, I suggested she try a PPI for about a month until she sees him. The prednisone certainly might be aggravating her tendency towards this.

## 2014-11-01 NOTE — Patient Instructions (Addendum)
Your physician has recommended you make the following change in your medication:  1) Hold Atenolol - Chlorthalidone 50-25 mg for ONE week. 2) Start a OTC Prilosec or medication similar.  Record your Heart rate between now and your follow up.  Your physician wants you to follow-up in: 2  Months with Dr. Graciela HusbandsKlein   Thank you for visiting with [facility] today!!

## 2014-11-05 ENCOUNTER — Other Ambulatory Visit: Payer: Self-pay | Admitting: Internal Medicine

## 2014-12-26 ENCOUNTER — Encounter: Payer: Self-pay | Admitting: *Deleted

## 2015-01-03 ENCOUNTER — Encounter: Payer: Self-pay | Admitting: Internal Medicine

## 2015-01-03 ENCOUNTER — Ambulatory Visit (INDEPENDENT_AMBULATORY_CARE_PROVIDER_SITE_OTHER): Payer: Medicare Other | Admitting: Internal Medicine

## 2015-01-03 VITALS — BP 120/70 | HR 66 | Ht 66.0 in | Wt 208.6 lb

## 2015-01-03 DIAGNOSIS — I480 Paroxysmal atrial fibrillation: Secondary | ICD-10-CM

## 2015-01-03 DIAGNOSIS — G4719 Other hypersomnia: Secondary | ICD-10-CM

## 2015-01-03 LAB — BASIC METABOLIC PANEL
BUN: 13 mg/dL (ref 6–23)
CALCIUM: 9.6 mg/dL (ref 8.4–10.5)
CHLORIDE: 97 meq/L (ref 96–112)
CO2: 28 mEq/L (ref 19–32)
Creatinine, Ser: 1 mg/dL (ref 0.4–1.2)
GFR: 60.35 mL/min (ref >60.00)
GLUCOSE: 140 mg/dL — AB (ref 70–99)
POTASSIUM: 3.8 meq/L (ref 3.5–5.1)
SODIUM: 133 meq/L — AB (ref 135–145)

## 2015-01-03 MED ORDER — APIXABAN 5 MG PO TABS: 5.0000 mg | ORAL_TABLET | Freq: Two times a day (BID) | ORAL | Status: DC

## 2015-01-03 MED ORDER — APIXABAN 5 MG PO TABS
5.0000 mg | ORAL_TABLET | Freq: Two times a day (BID) | ORAL | Status: DC
Start: 2015-01-03 — End: 2015-01-03

## 2015-01-03 NOTE — Patient Instructions (Addendum)
Your physician recommends that you continue on your current medications as directed. Please refer to the Current Medication list given to you today.  Lab today: BMET  Your physician has recommended that you have a sleep study. This test records several body functions during sleep, including: brain activity, eye movement, oxygen and carbon dioxide blood levels, heart rate and rhythm, breathing rate and rhythm, the flow of air through your mouth and nose, snoring, body muscle movements, and chest and belly movement.  Your physician wants you to follow-up in: 12 months with Dr. Graciela HusbandsKlein. You will receive a reminder letter in the mail two months in advance. If you don't receive a letter, please call our office to schedule the follow-up appointment.

## 2015-01-03 NOTE — Progress Notes (Signed)
Patient Care Team: Enrique Sack, MD as PCP - General (Internal Medicine)   HPI  Destiny Brown is a 69 y.o. female Seen in followup for PAF presenting with RVR 9/15   She has LBBB.  HTN Dm  EF 55%;  muyoview neg for ischemia    she is significantly improved with the elimination of the chest discomfort following the use of the PPI. The fatigue is improved with the discontinuation of the atenolol. She has had some blood pressure and some edema issues and Hydrochlorthiazide has been reinitiated. She has copious fluid intake    Past Medical History  Diagnosis Date  . Hypothyroid   . Hypertension   . Diabetes mellitus     NO MEDS,  DIET CONTROLLED  . Arthritis   . PONV (postoperative nausea and vomiting)     PT DOESNOT WANT TO TAKE HER ATENOLOL     DOS.   " IT DROPS MY BP TO LOW"  . Bronchitis     h/o  . History of migraines     "when working under alot of stress"  . Headache(784.0)   . LBBB (left bundle branch block) 09/22/2014  . Atrial fibrillation with RVR 09/21/2014    Past Surgical History  Procedure Laterality Date  . Appendectomy    . Lumbar disc surgery    . Cataract extraction, bilateral      WITH IMPLANTS  . Posterior fusion lumbar spine  12/16/11; 12/2005; 07/2004    L2-3; L3-4; L4-5  . Lumbar laminectomy/decompression microdiscectomy  10/2010  . Cervical fusion  08/2003  . Back surgery    . Abdominal hysterectomy  03/2001  . Cholecystectomy  03/2010    Current Outpatient Prescriptions  Medication Sig Dispense Refill  . albuterol (PROVENTIL HFA;VENTOLIN HFA) 108 (90 BASE) MCG/ACT inhaler Inhale 1-2 puffs into the lungs every 6 (six) hours as needed for wheezing or shortness of breath. 1 Inhaler 1  . apixaban (ELIQUIS) 5 MG TABS tablet Take 1 tablet (5 mg total) by mouth 2 (two) times daily. 180 tablet 1  . brimonidine (ALPHAGAN) 0.15 % ophthalmic solution Place 1 drop into the right eye 2 (two) times daily.    . Cholecalciferol (VITAMIN D3) 1000  UNITS CAPS Take 1,000 Units by mouth daily.      . hydrochlorothiazide (HYDRODIURIL) 25 MG tablet Take 25 mg by mouth daily.    Marland Kitchen ipratropium (ATROVENT HFA) 17 MCG/ACT inhaler Inhale 2 puffs into the lungs every 6 (six) hours as needed for wheezing. 1 Inhaler 1  . latanoprost (XALATAN) 0.005 % ophthalmic solution Place 1 drop into both eyes daily.    Marland Kitchen levothyroxine (SYNTHROID, LEVOTHROID) 125 MCG tablet Take 125 mcg by mouth daily.     Marland Kitchen losartan (COZAAR) 100 MG tablet Take 100 mg by mouth daily.    . metFORMIN (GLUCOPHAGE) 500 MG tablet Take 500 mg by mouth 2 (two) times daily.     . predniSONE (DELTASONE) 5 MG tablet Take 2.5 mg by mouth daily with breakfast.     . [DISCONTINUED] beclomethasone (QVAR) 80 MCG/ACT inhaler Inhale 2 puffs into the lungs 2 (two) times daily. 1 Inhaler 1   No current facility-administered medications for this visit.    Allergies  Allergen Reactions  . Codeine Other (See Comments)    Severe Headaches with codeine; but not with other opioids  . Percocet [Oxycodone-Acetaminophen] Other (See Comments)    hallucinations    Review of Systems negative except from HPI  and PMH  Physical Exam BP 120/70 mmHg  Pulse 66  Ht 5\' 6"  (1.676 m)  Wt 208 lb 9.6 oz (94.62 kg)  BMI 33.68 kg/m2 .-p.pes Well developed and nourished in no acute distress HENT normal Neck supple with JVP-flat Clear Regular rate and rhythm, no murmurs or gallops Abd-soft with active BS No Clubbing cyanosis trace left greater than right edema Skin-warm and dry A & Oriented  Grossly normal sensory and motor function  ECG demonstrates sinus rhythm at 66 Intervals 13/14/45  axis left at -47 Left bundle branch block   Assessment and  Plan  Paroxysmal atrial fibrillation  Left bundle branch block  Hypertension  Chest pain-resolved likely GI in origin  Daytime sleepiness  She has significant fatigue still and daytime sleepiness although is improved. We will undertake a sleep  study. We done at the regular lab as I'm concerned about the possibility of central sleep apnea  Overall she is significantly improved. She remains on her apixaban.  We'll check a metabolic profile given the recent introduction of her diuretic. I

## 2015-01-10 DIAGNOSIS — I4891 Unspecified atrial fibrillation: Secondary | ICD-10-CM | POA: Diagnosis not present

## 2015-01-10 DIAGNOSIS — E118 Type 2 diabetes mellitus with unspecified complications: Secondary | ICD-10-CM | POA: Diagnosis not present

## 2015-01-10 DIAGNOSIS — E119 Type 2 diabetes mellitus without complications: Secondary | ICD-10-CM | POA: Diagnosis not present

## 2015-01-10 DIAGNOSIS — I10 Essential (primary) hypertension: Secondary | ICD-10-CM | POA: Diagnosis not present

## 2015-02-07 ENCOUNTER — Other Ambulatory Visit: Payer: Self-pay

## 2015-02-07 DIAGNOSIS — Z1231 Encounter for screening mammogram for malignant neoplasm of breast: Secondary | ICD-10-CM

## 2015-02-22 ENCOUNTER — Ambulatory Visit
Admission: RE | Admit: 2015-02-22 | Discharge: 2015-02-22 | Disposition: A | Discharge status: Home / Self Care | Payer: Medicare Other | Source: Ambulatory Visit

## 2015-02-22 DIAGNOSIS — Z1231 Encounter for screening mammogram for malignant neoplasm of breast: Secondary | ICD-10-CM

## 2015-03-21 ENCOUNTER — Ambulatory Visit (HOSPITAL_BASED_OUTPATIENT_CLINIC_OR_DEPARTMENT_OTHER): Payer: Medicare Other | Attending: Internal Medicine | Admitting: *Deleted

## 2015-03-21 VITALS — Ht 65.0 in | Wt 201.0 lb

## 2015-03-21 DIAGNOSIS — G4719 Other hypersomnia: Secondary | ICD-10-CM

## 2015-03-21 DIAGNOSIS — G4733 Obstructive sleep apnea (adult) (pediatric): Secondary | ICD-10-CM

## 2015-03-21 DIAGNOSIS — R5382 Chronic fatigue, unspecified: Secondary | ICD-10-CM | POA: Diagnosis not present

## 2015-04-09 ENCOUNTER — Telehealth: Payer: Self-pay | Admitting: Cardiology

## 2015-04-09 ENCOUNTER — Encounter (HOSPITAL_BASED_OUTPATIENT_CLINIC_OR_DEPARTMENT_OTHER): Payer: Self-pay | Admitting: *Deleted

## 2015-04-09 DIAGNOSIS — G4733 Obstructive sleep apnea (adult) (pediatric): Secondary | ICD-10-CM

## 2015-04-09 HISTORY — DX: Obstructive sleep apnea (adult) (pediatric): G47.33

## 2015-04-09 NOTE — Sleep Study (Signed)
NAME: Destiny Brown DATE OF BIRTH:  08-08-1946 MEDICAL RECORD NUMBER [id number]  LOCATION: [facility]  PHYSICIAN: TURNER,TRACI R  DATE OF STUDY: 03/21/2015  SLEEP STUDY TYPE: Nocturnal Polysomnogram               REFERRING PHYSICIAN: Duke Salvia, MD  INDICATION FOR STUDY: excessive daytime sleepiness  EPWORTH SLEEPINESS SCORE: 17 HEIGHT:  (165.1 cm)  WEIGHT: 201 lb (91.173 kg)    Body mass index is 33.45 kg/(m^2).  NECK SIZE: 14 in.  MEDICATIONS: Reviewed in the chart  SLEEP ARCHITECTURE: During the diagnostic portion of the study, the patient slept for a total of 209 minutes out of a total sleep time of 222 minutes.  There was 11 minutes of slow wave sleep and 11 minutes of REM sleep.  The onset to sleep latency was normal at 3.5 minutes and the onset to REM sleep was delayed at 205 minutes.  There was an increased frequency of arousals due to respiratory and spontaneous arousals.  The sleep efficiency was normal at 92%. During the CPAP titration the patient slept for a total of 141 minutes out of a total sleep period of 163 minutes.  The onset to sleep latency was 16 minutes and the onset to REM sleep latency was normal at 75 minutes.  The sleep efficiency was reduced at 76%.    RESPIRATORY DATA: During the diagnostic portion of the study, there was 1 obstructive apnea and 57 hypopneas.  Most events occurred during NREM sleep in the non supine position.  The AHI was 16.7 events per hour consistent with moderate obstructive sleep apnea/hypopnea syndrome.  There was moderate snoring noted.  The patient was started on CPAP at 5cm H2O and titrated for respiratory events and snoring to 12cm H2O.  The patient was able to reach REM sleep at an optimum pressure of 8cm H2O but unable to achieve supine position.  The patient was able to maintain this pressure for prolonged periods of time without further respiratory events.  The AHI was 0 at 8cm H2O.    OXYGEN  DATA: The average oxygen saturation during the diagnostic portion was 94% and the lowest saturation was 79%.  During the CPAP titration, the average oxygen saturation was 95% and the lowest oxygen saturation was 92%.    CARDIAC DATA: The patient maintained NSR with PVC's throughout the study.  The average heart rate was 54 bpm and the lowest heart rate was 30 bpm.    MOVEMENT/PARASOMNIA: There were no periodic limb movements during the diagnostic portion of the study but that patient had increased leg movements during the CPAP titration with a PLMS index of 19 movements per hour.  There were no REM sleep behavior disorders noted.  IMPRESSION/ RECOMMENDATION:   1.  Moderate obstructive sleep apnea/hypopnea syndrome with an AHI of 16.7 events per hour.   Most events occurred during NREM sleep in the non supine position. 2.  Moderate snoring was noted. 3.  Abnormal sleep architecture with reduced and prolonged onset to REM sleep. 4.  Periodic limb movements noted during CPAP titration only. 5.  Oxygen desaturations as low as 79% associated with respiratory events. 6.  Sinus bradycardia and NSR with PVC's was maintained during the study.  The average HR was 54 bpm 7.  Successful CPAP titration to 8cm H2O.  The patient was able to reach REM sleep at an optimum pressure of 8cm H2O but unable to achieve supine position.  She  maintained this pressure for a prolonged period of time without further respiratory events. 8.  Recommend ResMed CPAP at 8cm H2O with heated humidifier, small [employer] Simplus full face mask.  Signed:  Quintella ReichertURNER,TRACI R Diplomate, American Board of Sleep Medicine  ELECTRONICALLY SIGNED ON:  04/09/2015, 8:38 PM  [facility] PH: [phone]   FX: [phone] ACCREDITED BY THE AMERICAN ACADEMY OF SLEEP MEDICINE

## 2015-04-09 NOTE — Telephone Encounter (Signed)
FYI patient had successful CPAP titration and will be set up with CPAP unit and I will see patient back in office in 10 weeks

## 2015-04-09 NOTE — Addendum Note (Signed)
Addended by: Quintella ReichertURNER, Arthuro Canelo R on: 04/09/2015 08:59 PM   Modules accepted: Orders

## 2015-04-12 NOTE — Telephone Encounter (Signed)
Patient is aware of results.  Follow-Up appt scheduled for 06/26/15 [facility]

## 2015-04-25 DIAGNOSIS — G4733 Obstructive sleep apnea (adult) (pediatric): Secondary | ICD-10-CM | POA: Diagnosis not present

## 2015-05-16 DIAGNOSIS — Z961 Presence of intraocular lens: Secondary | ICD-10-CM | POA: Diagnosis not present

## 2015-05-16 DIAGNOSIS — H04123 Dry eye syndrome of bilateral lacrimal glands: Secondary | ICD-10-CM | POA: Diagnosis not present

## 2015-05-16 DIAGNOSIS — E119 Type 2 diabetes mellitus without complications: Secondary | ICD-10-CM | POA: Diagnosis not present

## 2015-05-16 DIAGNOSIS — H4011X3 Primary open-angle glaucoma, severe stage: Secondary | ICD-10-CM | POA: Diagnosis not present

## 2015-06-04 ENCOUNTER — Encounter: Payer: Self-pay | Admitting: Cardiology

## 2015-06-13 DIAGNOSIS — E1165 Type 2 diabetes mellitus with hyperglycemia: Secondary | ICD-10-CM | POA: Diagnosis not present

## 2015-06-13 DIAGNOSIS — E039 Hypothyroidism, unspecified: Secondary | ICD-10-CM | POA: Diagnosis not present

## 2015-06-13 DIAGNOSIS — I10 Essential (primary) hypertension: Secondary | ICD-10-CM | POA: Diagnosis not present

## 2015-06-13 DIAGNOSIS — E118 Type 2 diabetes mellitus with unspecified complications: Secondary | ICD-10-CM | POA: Diagnosis not present

## 2015-06-13 DIAGNOSIS — I519 Heart disease, unspecified: Secondary | ICD-10-CM | POA: Diagnosis not present

## 2015-06-14 DIAGNOSIS — Z23 Encounter for immunization: Secondary | ICD-10-CM | POA: Diagnosis not present

## 2015-06-14 DIAGNOSIS — Z0000 Encounter for general adult medical examination without abnormal findings: Secondary | ICD-10-CM | POA: Diagnosis not present

## 2015-06-14 DIAGNOSIS — E119 Type 2 diabetes mellitus without complications: Secondary | ICD-10-CM | POA: Diagnosis not present

## 2015-06-14 DIAGNOSIS — I10 Essential (primary) hypertension: Secondary | ICD-10-CM | POA: Diagnosis not present

## 2015-06-14 DIAGNOSIS — I4891 Unspecified atrial fibrillation: Secondary | ICD-10-CM | POA: Diagnosis not present

## 2015-06-24 ENCOUNTER — Other Ambulatory Visit: Payer: Self-pay

## 2015-06-26 ENCOUNTER — Encounter: Payer: Medicare Other | Admitting: Cardiology

## 2015-06-26 DIAGNOSIS — E669 Obesity, unspecified: Secondary | ICD-10-CM | POA: Insufficient documentation

## 2015-06-26 NOTE — Progress Notes (Signed)
This encounter was created in error - please disregard.

## 2015-07-04 ENCOUNTER — Encounter: Payer: Self-pay | Admitting: Cardiology

## 2015-08-04 IMAGING — CR DG CHEST 2V
2 series · 2 of 2 positions shown · non-contrast
Comparison: 11/02/2011.

CLINICAL DATA: Cough.

EXAM:
CHEST  2 VIEW

[view not recorded (1 of 2)]
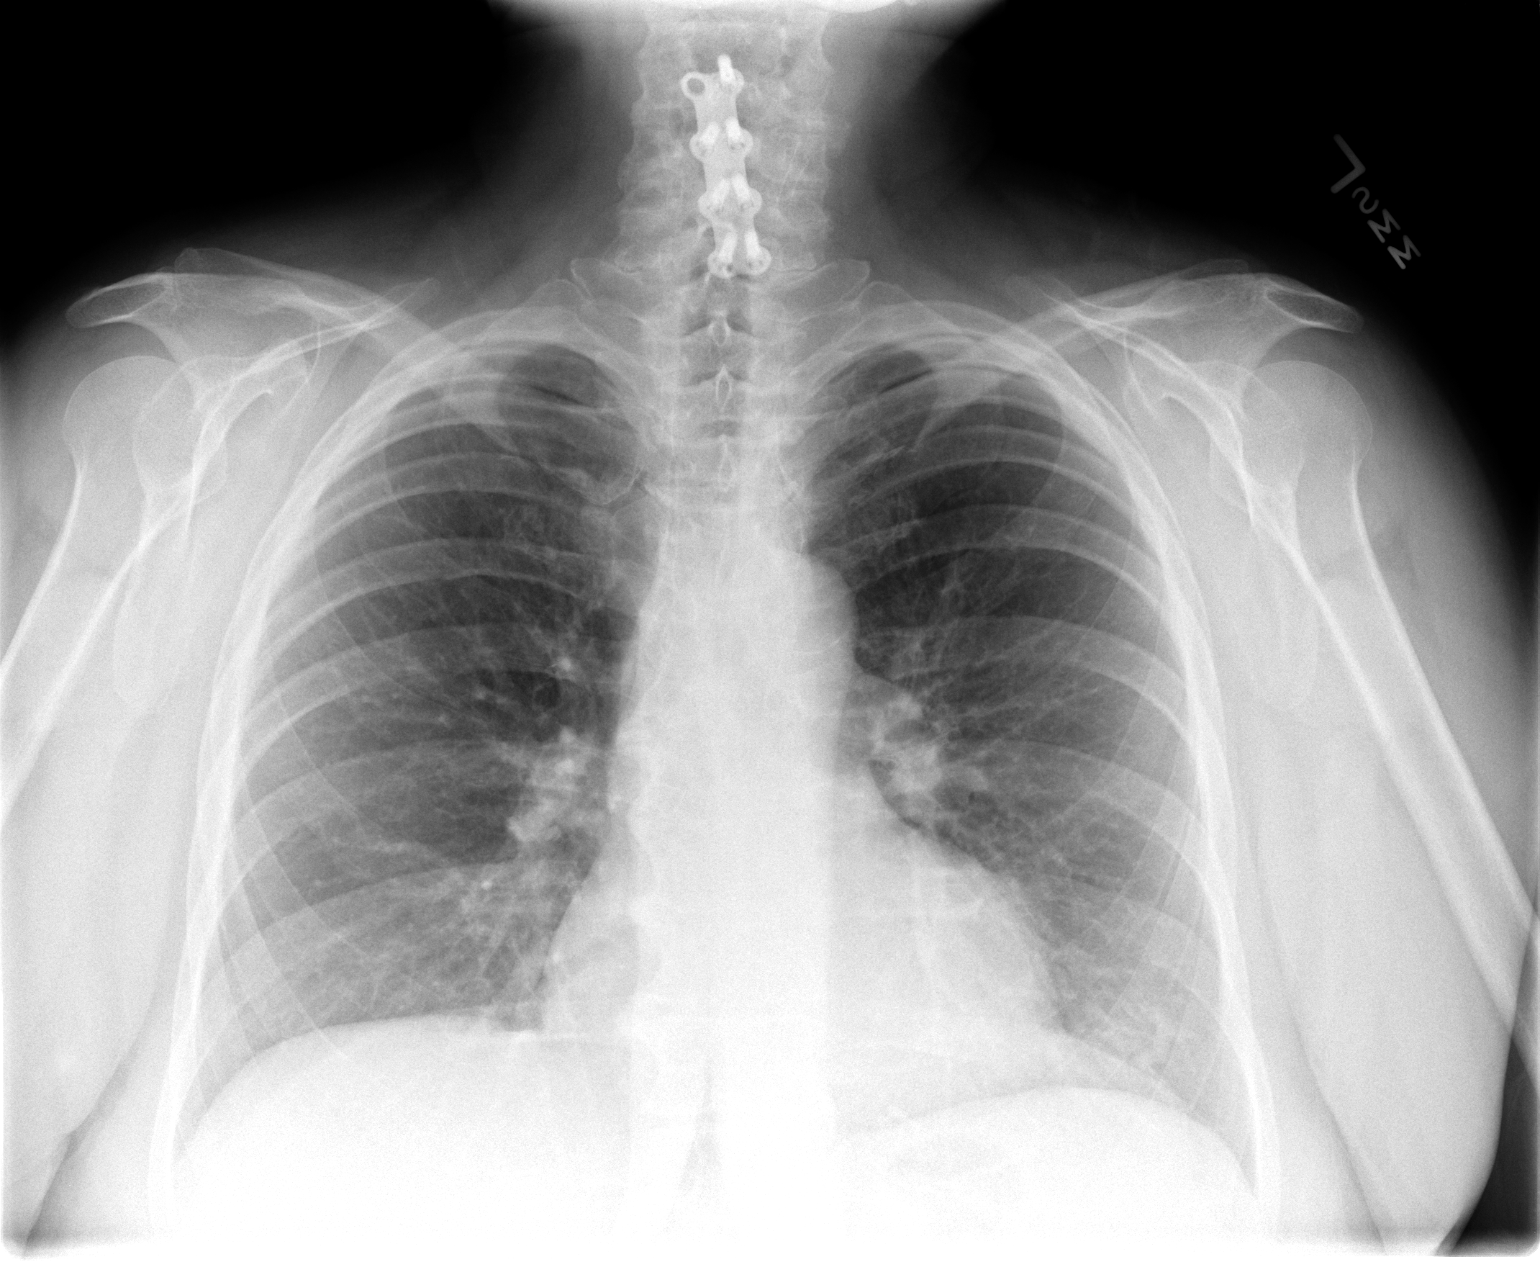

[view not recorded (2 of 2)]
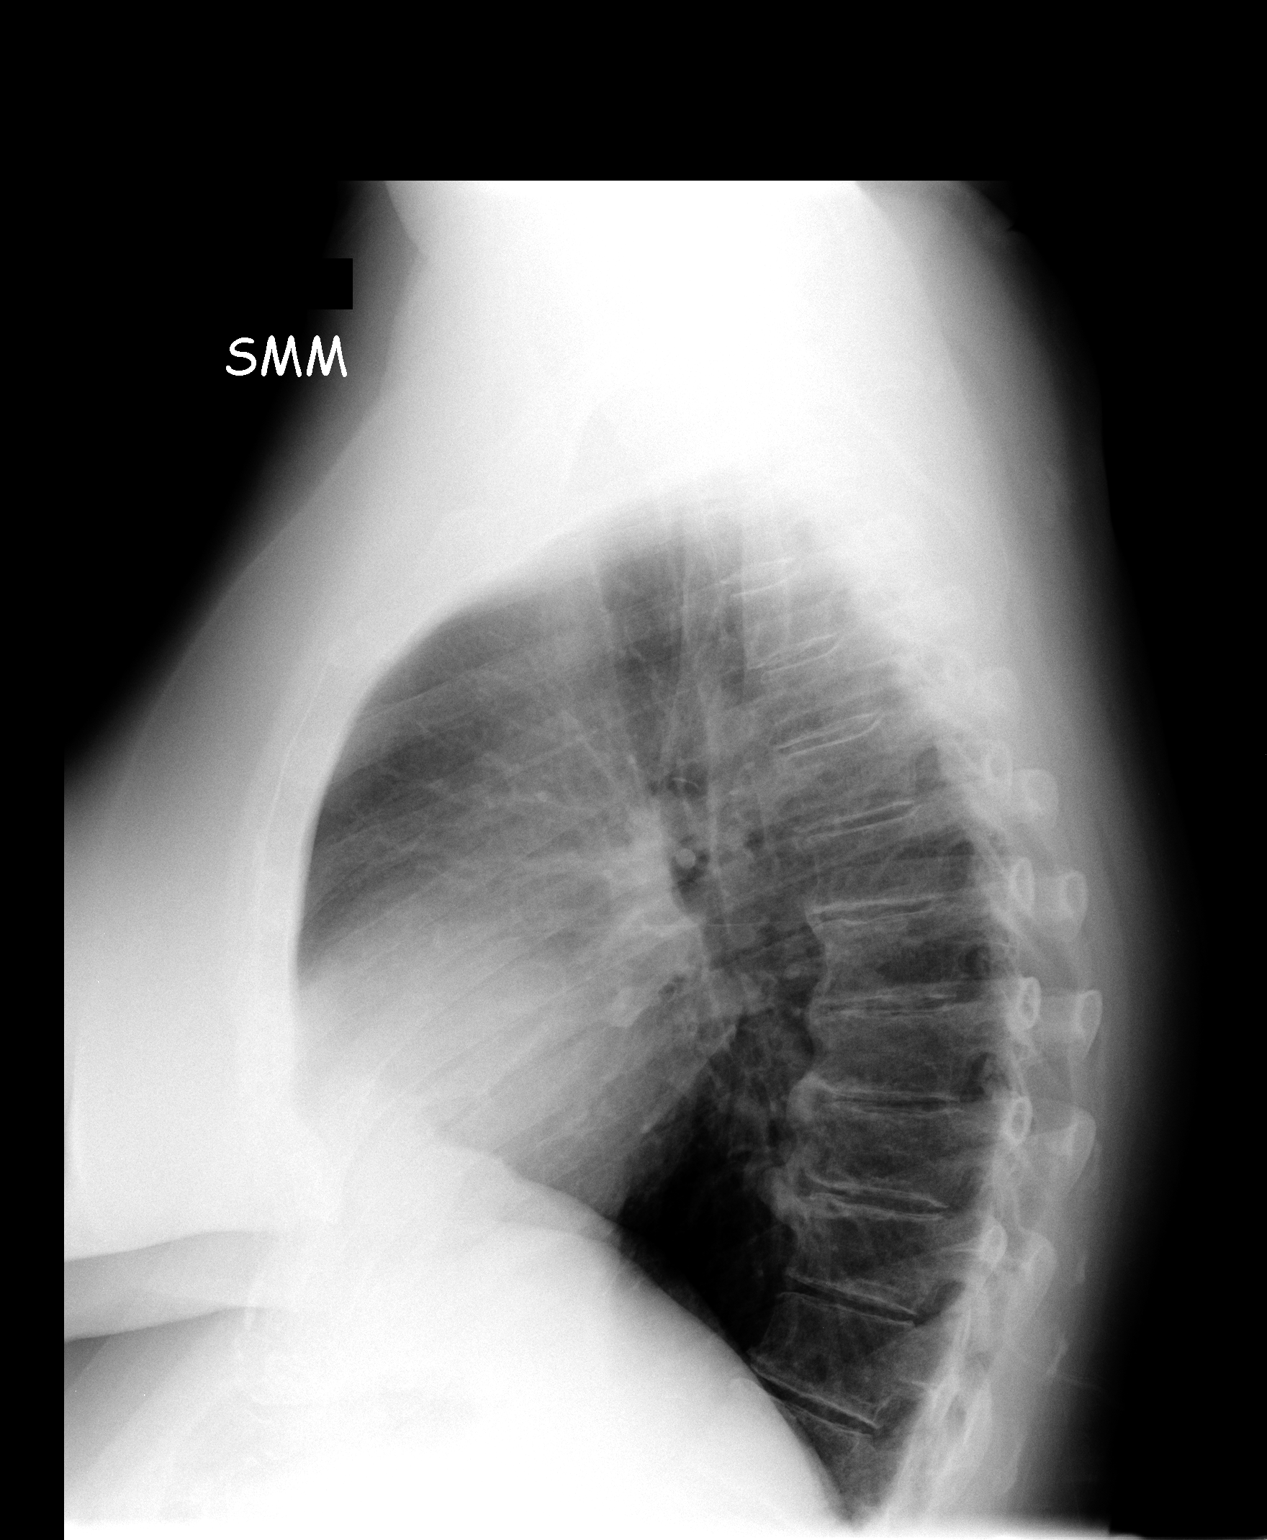

[2 of 2 positions shown; findings below may reference images not displayed]

FINDINGS: Mediastinum and hilar structures are normal. Mild subsegmental
atelectasis and/or mild infiltrate in the lingula. Poor inspiration.
No pleural effusion or pneumothorax. Heart size normal. Prior
cervical spine fusion. Degenerative changes thoracic spine and both
shoulders.
IMPRESSION: Mild subsegmental atelectasis and/or pneumonia in the lingula.

## 2015-08-18 ENCOUNTER — Encounter: Payer: Self-pay | Admitting: Cardiology

## 2015-08-18 NOTE — Progress Notes (Signed)
Cardiology Office Note   Date:  08/19/2015   ID:  Destiny Brown, DOB 11-May-1946, MRN [id number]  PCP:  Enrique Sack, MD    Chief Complaint  Patient presents with  . OSA      History of Present Illness: Destiny Brown is a 69 y.o. female who presents for evaluation of sleep apnea.  She has a history of PAF and HTN.  She complained to Dr. Graciela Husbands that she was having daytime fatigue and sleepiness and underwent PSG which showed moderate OSA with AHI 16.7/hr with most events occurring in NREM sleep in the non supine position.  Moderate snoring was noted.  Oxygen saturations dropped as low as 79% with respiratory events.  She underwent successful CPAP titration to 8cm H2O.  Since going on CPAP, she does not feel any  more rested in the am or improvement in her daytime sleepiness.  She tolerates her nasal pillow mask and feels the pressure is adequate.  She goes to bed at 9-10pm.  She falls asleep within 5 minutes.  She wakes up twice nightly to go to the bathroom and is able to go back to sleep.  She wakes up around 4-5am.  She thinks that some of her daytime sleepiness is due to her busy schedule.  She does not drink ETOH.  She does not snore.  She denies any significant mouth dryness.  She is having problems with feeling anxious when putting her mask back on at night after going to the bathroom which has been an issue with compliance.      Past Medical History  Diagnosis Date  . Hypothyroid   . Hypertension   . Diabetes mellitus     NO MEDS,  DIET CONTROLLED  . Arthritis   . PONV (postoperative nausea and vomiting)     PT DOESNOT WANT TO TAKE HER ATENOLOL     DOS.   " IT DROPS MY BP TO LOW"  . Bronchitis     h/o  . History of migraines     "when working under alot of stress"  . Headache(784.0)   . LBBB (left bundle branch block) 09/22/2014  . Atrial fibrillation with RVR 09/21/2014  . OSA (obstructive sleep apnea) 04/09/2015    Moderate OSA with AHI 16.7  events per hour with successful CPAP titration to 8cm H2O    Past Surgical History  Procedure Laterality Date  . Appendectomy    . Lumbar disc surgery    . Cataract extraction, bilateral      WITH IMPLANTS  . Posterior fusion lumbar spine  12/16/11; 12/2005; 07/2004    L2-3; L3-4; L4-5  . Lumbar laminectomy/decompression microdiscectomy  10/2010  . Cervical fusion  08/2003  . Back surgery    . Abdominal hysterectomy  03/2001  . Cholecystectomy  03/2010     Current Outpatient Prescriptions  Medication Sig Dispense Refill  . albuterol (PROVENTIL HFA;VENTOLIN HFA) 108 (90 BASE) MCG/ACT inhaler Inhale 1-2 puffs into the lungs every 6 (six) hours as needed for wheezing or shortness of breath. 1 Inhaler 1  . apixaban (ELIQUIS) 5 MG TABS tablet Take 1 tablet (5 mg total) by mouth 2 (two) times daily. 180 tablet 1  . brimonidine (ALPHAGAN) 0.15 % ophthalmic solution Place 1 drop into the right eye 2 (two) times daily.    . Cholecalciferol (VITAMIN D3) 1000 UNITS CAPS Take 1,000 Units by mouth  daily.      . hydrochlorothiazide (HYDRODIURIL) 25 MG tablet Take 25 mg by mouth daily.    Marland Kitchen ipratropium (ATROVENT HFA) 17 MCG/ACT inhaler Inhale 2 puffs into the lungs every 6 (six) hours as needed for wheezing. 1 Inhaler 1  . latanoprost (XALATAN) 0.005 % ophthalmic solution Place 1 drop into both eyes daily.    Marland Kitchen levothyroxine (SYNTHROID, LEVOTHROID) 125 MCG tablet Take 125 mcg by mouth daily.     Marland Kitchen losartan (COZAAR) 100 MG tablet Take 100 mg by mouth daily.    . metFORMIN (GLUCOPHAGE) 500 MG tablet Take 500 mg by mouth 2 (two) times daily.     . predniSONE (DELTASONE) 5 MG tablet Take 2.5 mg by mouth daily with breakfast.     . [DISCONTINUED] beclomethasone (QVAR) 80 MCG/ACT inhaler Inhale 2 puffs into the lungs 2 (two) times daily. 1 Inhaler 1   No current facility-administered medications for this visit.    Allergies:   Codeine and Percocet    Social History:  The patient  reports that she has  never smoked. She has never used smokeless tobacco. She reports that she does not drink alcohol or use illicit drugs.   Family History:  The patient's She was adopted. Family history is unknown by patient.    ROS:  Please see the history of present illness.   Otherwise, review of systems are positive for none.   All other systems are reviewed and negative.    PHYSICAL EXAM: VS:  BP 120/70 mmHg  Pulse 72  Ht 5\' 5"  (1.651 m)  Wt 206 lb (93.441 kg)  BMI 34.28 kg/m2  SpO2 97% , BMI Body mass index is 34.28 kg/(m^2). GEN: Well nourished, well developed, in no acute distress HEENT: normal Neck: no JVD, carotid bruits, or masses Cardiac: RRR; no murmurs, rubs, or gallops,no edema  Respiratory:  clear to auscultation bilaterally, normal work of breathing GI: soft, nontender, nondistended, + BS MS: no deformity or atrophy Skin: warm and dry, no rash Neuro:  Strength and sensation are intact Psych: euthymic mood, full affect   EKG:  EKG is not ordered today.    Recent Labs: 09/21/2014: TSH 2.010 10/11/2014: ALT 58*; Hemoglobin 13.2; Platelets 196; Pro B Natriuretic peptide (BNP) 365.5* 01/03/2015: BUN 13; Creatinine, Ser 1.0; Potassium 3.8; Sodium 133*    Lipid Panel    Component Value Date/Time   CHOL 172 09/22/2014 0331   TRIG 118 09/22/2014 0331   HDL 57 09/22/2014 0331   CHOLHDL 3.0 09/22/2014 0331   VLDL 24 09/22/2014 0331   LDLCALC 91 09/22/2014 0331      Wt Readings from Last 3 Encounters:  08/19/15 206 lb (93.441 kg)  03/21/15 201 lb (91.173 kg)  01/03/15 208 lb 9.6 oz (94.62 kg)        ASSESSMENT AND PLAN:  1.  Moderate OSA with an AHI of 16.7/hr now on CPAP at 8cm H2O and tolerating well. His  d/l showed an AHi of 2.2/hr on 8cm H2O but only 13% compliant in using more than 4 hours nightly.  I have also instructed the patient on proper sleep hygiene, avoidance of sleeping in the supine position and avoidance of alcohol within 4 hours of bedtime.  She has had  some problems getting used to the machine.  She tolerates the mask but has problems getting readjusted when she gets up to go to the bathroom and goes back to bed.  I have recommended that she take her machine to [facility]  to be looked at and possibly set up a RAMP or adjust it.  Also, I would like them to look at her machine since she says it makes a loud noise and keeps her awake.  I have reassured her that her CPAP is treating her OSA very well.  I have encouraged her to be more compliant with her CPAP as insurance will not pay if she is not compliant. We discussed the role that OSA with oxygen desaturations plays on triggering PAF.   2.  PAF- maintaining NSR 3.  HTN - controlled on Losartan/HCTZ 4.  Obesity   Current medicines are reviewed at length with the patient today.  The patient does not have concerns regarding medicines.  The following changes have been made:  no change  Labs/ tests ordered today: See above Assessment and Plan No orders of the defined types were placed in this encounter.     Disposition:   FU with me in 3 months  Signed, Quintella Reichert, MD  08/19/2015 4:14 PM    [facility] [address] [place], [place], [place]  [address] Phone: [phone]; Fax: [phone]

## 2015-08-19 ENCOUNTER — Encounter: Payer: Self-pay | Admitting: Cardiology

## 2015-08-19 ENCOUNTER — Ambulatory Visit (INDEPENDENT_AMBULATORY_CARE_PROVIDER_SITE_OTHER): Payer: Medicare Other | Admitting: Cardiology

## 2015-08-19 VITALS — BP 120/70 | HR 72 | Ht 65.0 in | Wt 206.0 lb

## 2015-08-19 DIAGNOSIS — G4733 Obstructive sleep apnea (adult) (pediatric): Secondary | ICD-10-CM

## 2015-08-19 DIAGNOSIS — I10 Essential (primary) hypertension: Secondary | ICD-10-CM | POA: Diagnosis not present

## 2015-08-19 DIAGNOSIS — E669 Obesity, unspecified: Secondary | ICD-10-CM

## 2015-08-19 NOTE — Patient Instructions (Signed)
Medication Instructions:  Your physician recommends that you continue on your current medications as directed. Please refer to the Current Medication list given to you today.   Labwork: None  Testing/Procedures: None  Follow-Up: Your physician recommends that you schedule a follow-up appointment in: 3 months with Dr. Mayford Knife.  Any Other Special Instructions Will Be Listed Below (If Applicable). [facility] will be in touch with you soon to schedule and appointment to look at your machine.

## 2015-08-27 ENCOUNTER — Encounter: Payer: Self-pay | Admitting: Cardiology

## 2015-09-04 ENCOUNTER — Ambulatory Visit: Payer: Medicare Other | Admitting: Cardiology

## 2015-09-13 ENCOUNTER — Encounter: Payer: Self-pay | Admitting: Cardiology

## 2015-09-18 DIAGNOSIS — I10 Essential (primary) hypertension: Secondary | ICD-10-CM | POA: Diagnosis not present

## 2015-09-18 DIAGNOSIS — E1165 Type 2 diabetes mellitus with hyperglycemia: Secondary | ICD-10-CM | POA: Diagnosis not present

## 2015-09-18 DIAGNOSIS — Z23 Encounter for immunization: Secondary | ICD-10-CM | POA: Diagnosis not present

## 2015-09-18 DIAGNOSIS — E118 Type 2 diabetes mellitus with unspecified complications: Secondary | ICD-10-CM | POA: Diagnosis not present

## 2015-09-18 DIAGNOSIS — D559 Anemia due to enzyme disorder, unspecified: Secondary | ICD-10-CM | POA: Diagnosis not present

## 2015-09-18 DIAGNOSIS — I4891 Unspecified atrial fibrillation: Secondary | ICD-10-CM | POA: Diagnosis not present

## 2015-09-24 DIAGNOSIS — G4733 Obstructive sleep apnea (adult) (pediatric): Secondary | ICD-10-CM | POA: Diagnosis not present

## 2015-10-10 DIAGNOSIS — H401133 Primary open-angle glaucoma, bilateral, severe stage: Secondary | ICD-10-CM | POA: Diagnosis not present

## 2015-10-10 DIAGNOSIS — H04123 Dry eye syndrome of bilateral lacrimal glands: Secondary | ICD-10-CM | POA: Diagnosis not present

## 2015-10-10 DIAGNOSIS — E119 Type 2 diabetes mellitus without complications: Secondary | ICD-10-CM | POA: Diagnosis not present

## 2015-10-10 DIAGNOSIS — Z961 Presence of intraocular lens: Secondary | ICD-10-CM | POA: Diagnosis not present

## 2015-10-10 DIAGNOSIS — H401233 Low-tension glaucoma, bilateral, severe stage: Secondary | ICD-10-CM | POA: Diagnosis not present

## 2015-11-01 DIAGNOSIS — G4733 Obstructive sleep apnea (adult) (pediatric): Secondary | ICD-10-CM | POA: Diagnosis not present

## 2015-11-15 ENCOUNTER — Ambulatory Visit (INDEPENDENT_AMBULATORY_CARE_PROVIDER_SITE_OTHER): Payer: Medicare Other | Admitting: Cardiology

## 2015-11-15 ENCOUNTER — Encounter: Payer: Self-pay | Admitting: Cardiology

## 2015-11-15 VITALS — BP 122/66 | HR 68 | Ht 65.0 in | Wt 204.5 lb

## 2015-11-15 DIAGNOSIS — G4733 Obstructive sleep apnea (adult) (pediatric): Secondary | ICD-10-CM | POA: Diagnosis not present

## 2015-11-15 DIAGNOSIS — E669 Obesity, unspecified: Secondary | ICD-10-CM

## 2015-11-15 DIAGNOSIS — H401222 Low-tension glaucoma, left eye, moderate stage: Secondary | ICD-10-CM | POA: Diagnosis not present

## 2015-11-15 DIAGNOSIS — I10 Essential (primary) hypertension: Secondary | ICD-10-CM

## 2015-11-15 DIAGNOSIS — H401212 Low-tension glaucoma, right eye, moderate stage: Secondary | ICD-10-CM | POA: Diagnosis not present

## 2015-11-15 NOTE — Progress Notes (Signed)
Cardiology Office Note   Date:  11/15/2015   ID:  SHADI LARNER, DOB 1946-08-19, MRN [id number]  PCP:  Enrique Sack, MD    Chief Complaint  Patient presents with  . Sleep Apnea      History of Present Illness: Destiny Brown is a 69 y.o. female who presents for followup of sleep apnea. She has a history of moderate OSA with AHI 16.7/hr with oxygen desaturations low as 79% with respiratory events. She is on CPAP at 8cm H2O.  She tolerates her nasal pillow mask and feels the pressure is adequate. She goes to bed at 9-10pm. She falls asleep within 5 minutes. She wakes up twice nightly to go to the bathroom and is able to go back to sleep. She wakes up around 4-5am.She feels rested when she gets up.  She has no daytime sleepiness.  She does not snore. She denies any significant mouth dryness.     Past Medical History  Diagnosis Date  . Hypothyroid   . Hypertension   . Diabetes mellitus     NO MEDS,  DIET CONTROLLED  . Arthritis   . PONV (postoperative nausea and vomiting)     PT DOESNOT WANT TO TAKE HER ATENOLOL     DOS.   " IT DROPS MY BP TO LOW"  . Bronchitis     h/o  . History of migraines     "when working under alot of stress"  . Headache(784.0)   . LBBB (left bundle branch block) 09/22/2014  . Atrial fibrillation with RVR (HCC) 09/21/2014  . OSA (obstructive sleep apnea) 04/09/2015    Moderate OSA with AHI 16.7 events per hour with successful CPAP titration to 8cm H2O    Past Surgical History  Procedure Laterality Date  . Appendectomy    . Lumbar disc surgery    . Cataract extraction, bilateral      WITH IMPLANTS  . Posterior fusion lumbar spine  12/16/11; 12/2005; 07/2004    L2-3; L3-4; L4-5  . Lumbar laminectomy/decompression microdiscectomy  10/2010  . Cervical fusion  08/2003  . Back surgery    . Abdominal hysterectomy  03/2001  . Cholecystectomy  03/2010     Current Outpatient Prescriptions  Medication Sig Dispense Refill   . ACCU-CHEK FASTCLIX LANCETS MISC 1 each by Other route 2 (two) times daily. Use 1 lancet bid    . ACCU-CHEK SMARTVIEW test strip 1 strip by Other route 2 (two) times daily. Use 1 strip to check glucose twice a day    . apixaban (ELIQUIS) 5 MG TABS tablet Take 1 tablet (5 mg total) by mouth 2 (two) times daily. 180 tablet 1  . brimonidine (ALPHAGAN) 0.15 % ophthalmic solution Place 1 drop into the right eye 2 (two) times daily.    . Cholecalciferol (VITAMIN D3) 1000 UNITS CAPS Take 1,000 Units by mouth daily.      . hydrochlorothiazide (HYDRODIURIL) 25 MG tablet Take 25 mg by mouth daily.    Marland Kitchen latanoprost (XALATAN) 0.005 % ophthalmic solution Place 1 drop into both eyes daily.    Marland Kitchen levothyroxine (SYNTHROID, LEVOTHROID) 125 MCG tablet Take 125 mcg by mouth daily.     Marland Kitchen losartan (COZAAR) 100 MG tablet Take 100 mg by mouth daily.    . metFORMIN (GLUCOPHAGE) 500 MG tablet Take 500 mg by mouth 2 (two) times daily.     . predniSONE (DELTASONE)  5 MG tablet Take 2.5 mg by mouth daily with breakfast.     . [DISCONTINUED] beclomethasone (QVAR) 80 MCG/ACT inhaler Inhale 2 puffs into the lungs 2 (two) times daily. 1 Inhaler 1   No current facility-administered medications for this visit.    Allergies:   Codeine and Percocet    Social History:  The patient  reports that she has never smoked. She has never used smokeless tobacco. She reports that she does not drink alcohol or use illicit drugs.   Family History:  The patient's She was adopted. Family history is unknown by patient.    ROS:  Please see the history of present illness.   Otherwise, review of systems are positive for none.   All other systems are reviewed and negative.    PHYSICAL EXAM: VS:  BP 122/66 mmHg  Pulse 68  Ht 5\' 5"  (1.651 m)  Wt 92.761 kg (204 lb 8 oz)  BMI 34.03 kg/m2 , BMI Body mass index is 34.03 kg/(m^2). GEN: Well nourished, well developed, in no acute distress HEENT: normal Neck: no JVD, carotid bruits, or  masses Cardiac: RRR; no murmurs, rubs, or gallops,no edema  Respiratory:  clear to auscultation bilaterally, normal work of breathing GI: soft, nontender, nondistended, + BS MS: no deformity or atrophy Skin: warm and dry, no rash Neuro:  Strength and sensation are intact Psych: euthymic mood, full affect   EKG:  EKG is not ordered today.    Recent Labs: 01/03/2015: BUN 13; Creatinine, Ser 1.0; Potassium 3.8; Sodium 133*    Lipid Panel    Component Value Date/Time   CHOL 172 09/22/2014 0331   TRIG 118 09/22/2014 0331   HDL 57 09/22/2014 0331   CHOLHDL 3.0 09/22/2014 0331   VLDL 24 09/22/2014 0331   LDLCALC 91 09/22/2014 0331      Wt Readings from Last 3 Encounters:  11/15/15 92.761 kg (204 lb 8 oz)  08/19/15 93.441 kg (206 lb)  03/21/15 91.173 kg (201 lb)     ASSESSMENT AND PLAN:  1. Moderate OSA on CPAP at 8cm H2O and tolerating well. Her d/l showed an AHI of 1.2/hr on 8cm H2O and 80% compliance in using more than 4 hours nightly. 2. HTN - controlled on Losartan/HCTZ 3. Obesity - I have encouraged her to get into a walking program.  She walks some at work but it is not continuous aerobic exercise.   Current medicines are reviewed at length with the patient today.  The patient does not have concerns regarding medicines.  The following changes have been made:  no change  Labs/ tests ordered today: See above Assessment and Plan No orders of the defined types were placed in this encounter.     Disposition:   FU with me in 1 year  Signed, Quintella ReichertURNER,TRACI R, MD  11/15/2015 3:48 PM    [facility] [address], [place], [place]  [id number] Phone: [phone]; Fax: [phone]

## 2015-11-15 NOTE — Patient Instructions (Signed)

## 2015-11-28 ENCOUNTER — Encounter: Payer: Self-pay | Admitting: Cardiology

## 2015-11-28 DIAGNOSIS — H401213 Low-tension glaucoma, right eye, severe stage: Secondary | ICD-10-CM | POA: Diagnosis not present

## 2016-01-02 DIAGNOSIS — H401222 Low-tension glaucoma, left eye, moderate stage: Secondary | ICD-10-CM | POA: Diagnosis not present

## 2016-01-02 DIAGNOSIS — H401213 Low-tension glaucoma, right eye, severe stage: Secondary | ICD-10-CM | POA: Diagnosis not present

## 2016-01-02 DIAGNOSIS — Z961 Presence of intraocular lens: Secondary | ICD-10-CM | POA: Diagnosis not present

## 2016-01-15 DIAGNOSIS — G4730 Sleep apnea, unspecified: Secondary | ICD-10-CM | POA: Diagnosis not present

## 2016-01-15 DIAGNOSIS — E1165 Type 2 diabetes mellitus with hyperglycemia: Secondary | ICD-10-CM | POA: Diagnosis not present

## 2016-01-15 DIAGNOSIS — R51 Headache: Secondary | ICD-10-CM | POA: Diagnosis not present

## 2016-01-20 ENCOUNTER — Encounter: Payer: Self-pay | Admitting: Neurology

## 2016-01-20 ENCOUNTER — Ambulatory Visit (INDEPENDENT_AMBULATORY_CARE_PROVIDER_SITE_OTHER): Payer: Medicare Other | Admitting: Neurology

## 2016-01-20 VITALS — BP 163/82 | HR 56 | Ht 65.0 in | Wt 202.0 lb

## 2016-01-20 DIAGNOSIS — E538 Deficiency of other specified B group vitamins: Secondary | ICD-10-CM | POA: Diagnosis not present

## 2016-01-20 DIAGNOSIS — H532 Diplopia: Secondary | ICD-10-CM | POA: Insufficient documentation

## 2016-01-20 DIAGNOSIS — G43709 Chronic migraine without aura, not intractable, without status migrainosus: Secondary | ICD-10-CM

## 2016-01-20 DIAGNOSIS — H547 Unspecified visual loss: Secondary | ICD-10-CM | POA: Diagnosis not present

## 2016-01-20 MED ORDER — GABAPENTIN 100 MG PO CAPS: 100.0000 mg | ORAL_CAPSULE | Freq: Three times a day (TID) | ORAL | Status: DC

## 2016-01-20 NOTE — Progress Notes (Signed)
Reason for visit: Headache and double vision  Referring physician: Dr. Rodman Comp is a 70 y.o. female  History of present illness:  Destiny Brown is a 70 year old right-handed white female with a history of changes in vision dating back to 3 years. The patient has been followed by Dr. Dione Booze for this, she has had bilateral cataract surgery. She has had some onset of monocular diplopia mainly affecting the right eye in a vertical plane. She was seen by Dr. Theone Murdoch from [facility] for an evaluation. No definite conclusions concerning the etiology of vision changes were noted. The patient has had some prominent blurring of vision, left greater than right eye. She has a sensation as if she is losing peripheral vision in the eyes, she has increasing problems with reading. She reports that she has started to have daily headaches over the last one month. The headaches are clearly worse when she tries to focus on something or read something. She was having occasional headaches previously. The headaches are bifrontal in nature, and may be associated with nausea, no photophobia or phonophobia has been noted. The patient has a remote history of migraine headaches, she believes that these headaches are not similar to her usual migraine. She reports some left shoulder discomfort, she denies any numbness or weakness of the extremities. She denies any severe problems with balance or difficulty controlling the bowels or the bladder. She does have a history of diabetes. She has a history of open angle glaucoma. She is sent to this office for an evaluation. She takes Tylenol for the headache with minimal benefit.  Past Medical History  Diagnosis Date  . Hypothyroid   . Hypertension   . Diabetes mellitus     NO MEDS,  DIET CONTROLLED  . Arthritis   . PONV (postoperative nausea and vomiting)     PT DOESNOT WANT TO TAKE HER ATENOLOL     DOS.   " IT DROPS MY BP TO LOW"  . Bronchitis     h/o  . History  of migraines     "when working under alot of stress"  . Headache(784.0)   . LBBB (left bundle branch block) 09/22/2014  . Atrial fibrillation with RVR (HCC) 09/21/2014  . OSA (obstructive sleep apnea) 04/09/2015    Moderate OSA with AHI 16.7 events per hour with successful CPAP titration to 8cm H2O    Past Surgical History  Procedure Laterality Date  . Appendectomy    . Lumbar disc surgery    . Cataract extraction, bilateral      WITH IMPLANTS  . Posterior fusion lumbar spine  12/16/11; 12/2005; 07/2004    L2-3; L3-4; L4-5  . Lumbar laminectomy/decompression microdiscectomy  10/2010  . Cervical fusion  08/2003  . Back surgery    . Abdominal hysterectomy  03/2001  . Cholecystectomy  03/2010    Family History  Problem Relation Age of Onset  . Adopted: Yes  . Family history unknown: Yes    Social history:  reports that she has never smoked. She has never used smokeless tobacco. She reports that she does not drink alcohol or use illicit drugs.  Medications:  Prior to Admission medications   Medication Sig Start Date End Date Taking? Authorizing Provider  ACCU-CHEK FASTCLIX LANCETS MISC 1 each by Other route 2 (two) times daily. Use 1 lancet bid 10/21/15  Yes Historical Provider, MD  ACCU-CHEK SMARTVIEW test strip 1 strip by Other route 2 (two) times daily. Use 1  strip to check glucose twice a day 10/21/15  Yes Historical Provider, MD  apixaban (ELIQUIS) 5 MG TABS tablet Take 1 tablet (5 mg total) by mouth 2 (two) times daily. 01/03/15  Yes Duke Salvia, MD  brimonidine (ALPHAGAN) 0.15 % ophthalmic solution Place 1 drop into the right eye 2 (two) times daily.   Yes Historical Provider, MD  Cholecalciferol (VITAMIN D3) 1000 UNITS CAPS Take 1,000 Units by mouth daily.     Yes Historical Provider, MD  dorzolamide-timolol (COSOPT) 22.3-6.8 MG/ML ophthalmic solution Place 1 drop into the right eye 2 (two) times daily. 01/07/16  Yes Historical Provider, MD  hydrochlorothiazide (HYDRODIURIL) 25  MG tablet Take 25 mg by mouth daily.   Yes Historical Provider, MD  latanoprost (XALATAN) 0.005 % ophthalmic solution Place 1 drop into both eyes daily.   Yes Historical Provider, MD  levothyroxine (SYNTHROID, LEVOTHROID) 125 MCG tablet Take 125 mcg by mouth daily.    Yes Historical Provider, MD  losartan (COZAAR) 100 MG tablet Take 100 mg by mouth daily.   Yes Historical Provider, MD  metFORMIN (GLUCOPHAGE) 500 MG tablet Take 500 mg by mouth 2 (two) times daily.    Yes Historical Provider, MD  predniSONE (DELTASONE) 5 MG tablet Take 2.5 mg by mouth daily with breakfast.    Yes Historical Provider, MD      Allergies  Allergen Reactions  . Codeine Other (See Comments)    Severe Headaches with codeine; but not with other opioids  . Percocet [Oxycodone-Acetaminophen] Other (See Comments)    hallucinations    ROS:  Out of a complete 14 system review of symptoms, the patient complains only of the following symptoms, and all other reviewed systems are negative.  Blurred vision Headache  Blood pressure 163/82, pulse 56, height  (1.651 m), weight 202 lb (91.627 kg).  Physical Exam  General: The patient is alert and cooperative at the time of the examination. The patient is moderately obese.  Eyes: Pupils are equal, round, and reactive to light. Discs are flat bilaterally.  Neck: The neck is supple, no carotid bruits are noted.  Respiratory: The respiratory examination is clear.  Cardiovascular: The cardiovascular examination reveals a regular rate and rhythm, no obvious murmurs or rubs are noted.  Skin: Extremities are without significant edema.  Neurologic Exam  Mental status: The patient is alert and oriented x 3 at the time of the examination. The patient has apparent normal recent and remote memory, with an apparently normal attention span and concentration ability.  Cranial nerves: Facial symmetry is present. There is good sensation of the face to pinprick and soft touch  bilaterally. The strength of the facial muscles and the muscles to head turning and shoulder shrug are normal bilaterally. Speech is well enunciated, no aphasia or dysarthria is noted. Extraocular movements are full. Visual fields are full. The tongue is midline, and the patient has symmetric elevation of the soft palate. No obvious hearing deficits are noted. With cover testing, the patient reports mild vertical double vision with each eye independently.  Motor: The motor testing reveals 5 over 5 strength of all 4 extremities. Good symmetric motor tone is noted throughout.  Sensory: Sensory testing is intact to pinprick, soft touch, vibration sensation, and position sense on all 4 extremities. No evidence of extinction is noted.  Coordination: Cerebellar testing reveals good finger-nose-finger and heel-to-shin bilaterally.  Gait and station: Gait is normal. Tandem gait is slightly unsteady. Romberg is negative. No drift is seen.  Reflexes:  Deep tendon reflexes are symmetric, but are depressed bilaterally. Toes are downgoing bilaterally.   Assessment/Plan:  1. History of migraine headache  2. Current bifrontal headache, daily  3. Bilateral monocular diplopia  The patient reports vertical double vision in each eye independently suggestive of an intraocular problem. The patient also reports some constriction of the peripheral fields of vision, and increased problems with blurring of vision. The patient is having increasing problems with reading and focusing, she is beginning to have bifrontal headaches that may be worsened by trying to focus, and may represent in part an eye strain headache. Given the new onset headache, we will check further blood work today, she will have MRI of the brain, and a visual evoked response test given the reports of constriction of peripheral visual fields. I will contact the patient concerning the results of the above. Gabapentin in low dose will be started for the  headache.  Marlan Palau MD 01/20/2016 6:39 PM  [facility] [address] [place], [place] [address]  Phone [phone] Fax [phone]

## 2016-01-21 LAB — VITAMIN B12: Vitamin B-12: 579 pg/mL (ref 211–946)

## 2016-01-21 LAB — SEDIMENTATION RATE: Sed Rate: 21 mm/hr (ref 0–40)

## 2016-01-21 LAB — ANGIOTENSIN CONVERTING ENZYME: Angio Convert Enzyme: 19 U/L (ref 14–82)

## 2016-01-21 LAB — ANA W/REFLEX: Anti Nuclear Antibody(ANA): NEGATIVE

## 2016-01-24 ENCOUNTER — Ambulatory Visit (INDEPENDENT_AMBULATORY_CARE_PROVIDER_SITE_OTHER): Payer: Medicare Other | Admitting: Internal Medicine

## 2016-01-24 ENCOUNTER — Encounter: Payer: Self-pay | Admitting: Internal Medicine

## 2016-01-24 VITALS — Ht 65.0 in | Wt 204.8 lb

## 2016-01-24 DIAGNOSIS — I480 Paroxysmal atrial fibrillation: Secondary | ICD-10-CM

## 2016-01-24 NOTE — Patient Instructions (Signed)
Medication Instructions: - no changes  Labwork: - none  Procedures/Testing: - none  Follow-Up: - Your physician wants you to follow-up in: 1 year with Dr. Klein. You will receive a reminder letter in the mail two months in advance. If you don't receive a letter, please call our office to schedule the follow-up appointment.  Any Additional Special Instructions Will Be Listed Below (If Applicable).     If you need a refill on your cardiac medications before your next appointment, please call your pharmacy.   

## 2016-01-24 NOTE — Progress Notes (Signed)
Patient Care Team: Nila Nephew, MD as PCP - General (Internal Medicine) Ernesto Rutherford, MD as Consulting Physician (Ophthalmology)   HPI  Destiny Brown is a 70 y.o. female Seen in followup for PAF presenting with RVR 9/15   She has LBBB.  HTN Dm  EF 55%;  muyoview neg for ischemia    she is significantly improved with the elimination of the chest discomfort following the use of the PPI. The fatigue is improved with the discontinuation of the atenolol. She has had some blood pressure and some edema issues and Hydrochlorthiazide has been reinitiated. She has copious fluid intake    Past Medical History  Diagnosis Date  . Hypothyroid   . Hypertension   . Diabetes mellitus     NO MEDS,  DIET CONTROLLED  . Arthritis   . PONV (postoperative nausea and vomiting)     PT DOESNOT WANT TO TAKE HER ATENOLOL     DOS.   " IT DROPS MY BP TO LOW"  . Bronchitis     h/o  . History of migraines     "when working under alot of stress"  . Headache(784.0)   . LBBB (left bundle branch block) 09/22/2014  . Atrial fibrillation with RVR (HCC) 09/21/2014  . OSA (obstructive sleep apnea) 04/09/2015    Moderate OSA with AHI 16.7 events per hour with successful CPAP titration to 8cm H2O    Past Surgical History  Procedure Laterality Date  . Appendectomy    . Lumbar disc surgery    . Cataract extraction, bilateral      WITH IMPLANTS  . Posterior fusion lumbar spine  12/16/11; 12/2005; 07/2004    L2-3; L3-4; L4-5  . Lumbar laminectomy/decompression microdiscectomy  10/2010  . Cervical fusion  08/2003  . Back surgery    . Abdominal hysterectomy  03/2001  . Cholecystectomy  03/2010    Current Outpatient Prescriptions  Medication Sig Dispense Refill  . ACCU-CHEK FASTCLIX LANCETS MISC 1 each by Other route 2 (two) times daily. Use 1 lancet bid    . ACCU-CHEK SMARTVIEW test strip 1 strip by Other route 2 (two) times daily. Use 1 strip to check glucose twice a day    . apixaban (ELIQUIS) 5 MG  TABS tablet Take 1 tablet (5 mg total) by mouth 2 (two) times daily. 180 tablet 1  . brimonidine (ALPHAGAN) 0.15 % ophthalmic solution Place 1 drop into the right eye 2 (two) times daily.    . Cholecalciferol (VITAMIN D3) 1000 UNITS CAPS Take 1,000 Units by mouth daily.      . dorzolamide-timolol (COSOPT) 22.3-6.8 MG/ML ophthalmic solution Place 1 drop into the right eye 2 (two) times daily.  3  . gabapentin (NEURONTIN) 100 MG capsule Take 1 capsule (100 mg total) by mouth 3 (three) times daily. 90 capsule 1  . hydrochlorothiazide (HYDRODIURIL) 25 MG tablet Take 25 mg by mouth daily.    Marland Kitchen latanoprost (XALATAN) 0.005 % ophthalmic solution Place 1 drop into both eyes daily.    Marland Kitchen levothyroxine (SYNTHROID, LEVOTHROID) 125 MCG tablet Take 125 mcg by mouth daily.     Marland Kitchen losartan (COZAAR) 100 MG tablet Take 100 mg by mouth daily.    . metFORMIN (GLUCOPHAGE) 500 MG tablet Take 500 mg by mouth 2 (two) times daily.     . predniSONE (DELTASONE) 5 MG tablet Take 2.5 mg by mouth daily with breakfast.     . [DISCONTINUED] beclomethasone (QVAR) 80 MCG/ACT inhaler Inhale 2 puffs  into the lungs 2 (two) times daily. 1 Inhaler 1   No current facility-administered medications for this visit.    Allergies  Allergen Reactions  . Codeine Other (See Comments)    Severe Headaches with codeine; but not with other opioids  . Percocet [Oxycodone-Acetaminophen] Other (See Comments)    hallucinations    Review of Systems negative except from HPI and PMH  Physical Exam Ht  (1.651 m)  Wt 204 lb 12.8 oz (92.897 kg)  BMI 34.08 kg/m2 .-p.pes Well developed and nourished in no acute distress HENT normal Neck supple with JVP-flat Clear Regular rate and rhythm, no murmurs or gallops Abd-soft with active BS No Clubbing cyanosis trace left greater than right edema Skin-warm and dry A & Oriented  Grossly normal sensory and motor function  ECG demonstrates sinus rhythm at 66 Intervals 13/14/45  axis left at  -47 Left bundle branch block   Assessment and  Plan  Paroxysmal atrial fibrillation  Left bundle branch block  Hypertension  Daytime sleepiness  She is much improved  Will continue current meds

## 2016-01-29 ENCOUNTER — Ambulatory Visit
Admission: RE | Admit: 2016-01-29 | Discharge: 2016-01-29 | Disposition: A | Discharge status: Home / Self Care | Payer: Medicare Other | Source: Ambulatory Visit | Attending: Neurology | Admitting: Neurology

## 2016-01-29 DIAGNOSIS — G43709 Chronic migraine without aura, not intractable, without status migrainosus: Secondary | ICD-10-CM | POA: Diagnosis not present

## 2016-01-29 DIAGNOSIS — H532 Diplopia: Secondary | ICD-10-CM

## 2016-01-30 ENCOUNTER — Telehealth: Payer: Self-pay | Admitting: Neurology

## 2016-01-30 NOTE — Telephone Encounter (Signed)
I called patient. MRI the brain shows mild small vessel disease, consistent with a history of diabetes and hypertension. No etiology for her visual complaints. The patient has seen Dr. Theone Murdoch in the past. A visual evoked response test is pending, I will call her when I get this result.   MRI brain 01/30/16:  IMPRESSION:  Abnormal MRI brain (without) demonstrating: 1. Mild chronic small vessel ischemic disease. 2. No acute findings.

## 2016-02-10 ENCOUNTER — Other Ambulatory Visit: Payer: Self-pay

## 2016-02-10 DIAGNOSIS — Z1231 Encounter for screening mammogram for malignant neoplasm of breast: Secondary | ICD-10-CM

## 2016-02-18 ENCOUNTER — Telehealth: Payer: Self-pay | Admitting: Neurology

## 2016-02-18 ENCOUNTER — Ambulatory Visit (INDEPENDENT_AMBULATORY_CARE_PROVIDER_SITE_OTHER): Payer: Medicare Other

## 2016-02-18 DIAGNOSIS — H538 Other visual disturbances: Secondary | ICD-10-CM

## 2016-02-18 DIAGNOSIS — H532 Diplopia: Secondary | ICD-10-CM

## 2016-02-18 DIAGNOSIS — G43709 Chronic migraine without aura, not intractable, without status migrainosus: Secondary | ICD-10-CM

## 2016-02-18 DIAGNOSIS — H547 Unspecified visual loss: Secondary | ICD-10-CM

## 2016-02-18 NOTE — Telephone Encounter (Signed)
I called patient. The visual evoked response test shows slowing on the left, normal on the right. The patient has a congenital strabismus, her vision in the left eye is not good because of this. The abnormalities seen on the visual evoked response test is likely related to this.  The cause of the blurred vision in the right eye is not clear. I will make another referral to Dr. Theone Murdoch. She has seen him previously.

## 2016-02-18 NOTE — Procedures (Signed)
    History:   Destiny Brown is a 70 year old patient with a history of monocular diplopia bilaterally. MRI the brain showing nonspecific white matter changes. The patient reports some blurring of vision from the left eye as well. She is being evaluated for this symptom.  Description: The visual evoked response test was performed today using 32 x 32 check sizes. The absolute latencies for the N1 and the P100 wave forms were within normal limits bilaterally, with exception that the left N1 latency was prolonged. The amplitudes for the P100 wave forms were also within normal limits bilaterally, but the P100 latency on the left is significantly more prolonged than that on the right.. The visual acuity was 20/40 OD and 20/40 OS corrected.  Impression:  The visual evoked response test above was within normal limits on the right, but relatively significant prolonged on the left, suggestive of a lesion within the anterior visual pathway on the left. This may represent a demyelinating or ischemic process. Clinical correlation is required.

## 2016-02-21 DIAGNOSIS — Z9841 Cataract extraction status, right eye: Secondary | ICD-10-CM | POA: Diagnosis not present

## 2016-02-21 DIAGNOSIS — Z961 Presence of intraocular lens: Secondary | ICD-10-CM | POA: Diagnosis not present

## 2016-02-21 DIAGNOSIS — H3589 Other specified retinal disorders: Secondary | ICD-10-CM | POA: Diagnosis not present

## 2016-02-21 DIAGNOSIS — H53002 Unspecified amblyopia, left eye: Secondary | ICD-10-CM | POA: Diagnosis not present

## 2016-02-21 DIAGNOSIS — H532 Diplopia: Secondary | ICD-10-CM | POA: Diagnosis not present

## 2016-02-21 DIAGNOSIS — H02403 Unspecified ptosis of bilateral eyelids: Secondary | ICD-10-CM | POA: Diagnosis not present

## 2016-02-21 DIAGNOSIS — Z9842 Cataract extraction status, left eye: Secondary | ICD-10-CM | POA: Diagnosis not present

## 2016-02-21 DIAGNOSIS — H53453 Other localized visual field defect, bilateral: Secondary | ICD-10-CM | POA: Diagnosis not present

## 2016-02-25 ENCOUNTER — Ambulatory Visit
Admission: RE | Admit: 2016-02-25 | Discharge: 2016-02-25 | Disposition: A | Discharge status: Home / Self Care | Payer: Medicare Other | Source: Ambulatory Visit

## 2016-02-25 DIAGNOSIS — Z1231 Encounter for screening mammogram for malignant neoplasm of breast: Secondary | ICD-10-CM | POA: Diagnosis not present

## 2016-02-25 DIAGNOSIS — G4733 Obstructive sleep apnea (adult) (pediatric): Secondary | ICD-10-CM | POA: Diagnosis not present

## 2016-03-06 DIAGNOSIS — H532 Diplopia: Secondary | ICD-10-CM | POA: Diagnosis not present

## 2016-03-06 DIAGNOSIS — E1165 Type 2 diabetes mellitus with hyperglycemia: Secondary | ICD-10-CM | POA: Diagnosis not present

## 2016-03-11 DIAGNOSIS — H53453 Other localized visual field defect, bilateral: Secondary | ICD-10-CM | POA: Diagnosis not present

## 2016-03-11 DIAGNOSIS — H532 Diplopia: Secondary | ICD-10-CM | POA: Diagnosis not present

## 2016-03-24 DIAGNOSIS — G4733 Obstructive sleep apnea (adult) (pediatric): Secondary | ICD-10-CM | POA: Diagnosis not present

## 2016-04-24 DIAGNOSIS — G4733 Obstructive sleep apnea (adult) (pediatric): Secondary | ICD-10-CM | POA: Diagnosis not present

## 2016-04-26 IMAGING — CR DG CHEST 2V
2 series · 2 of 2 positions shown · non-contrast
Comparison: PA and lateral chest 01/18/2014 and 11/02/2011.

CLINICAL DATA: Chest pain and heaviness beginning yesterday.

EXAM:
CHEST  2 VIEW

[w chest pa]
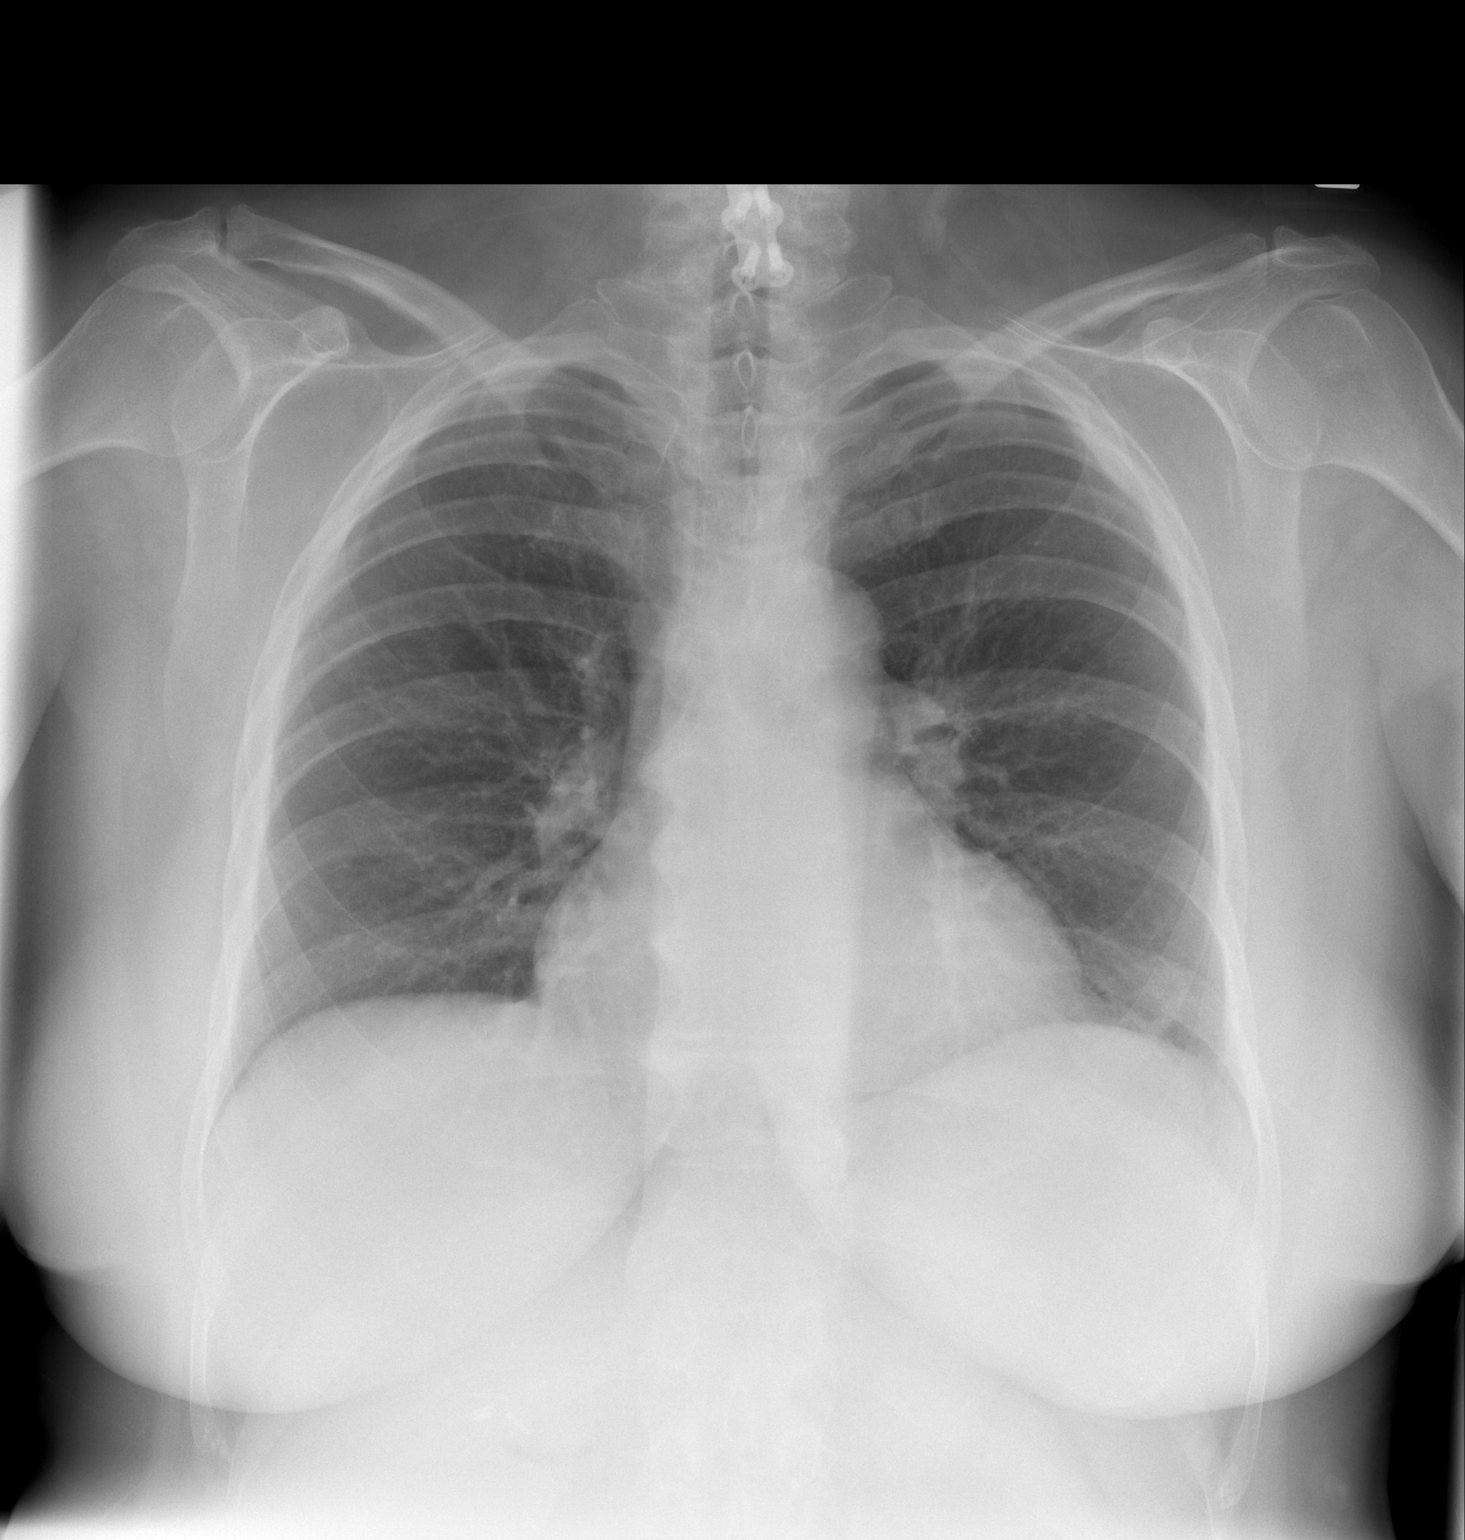

[w chest lat]
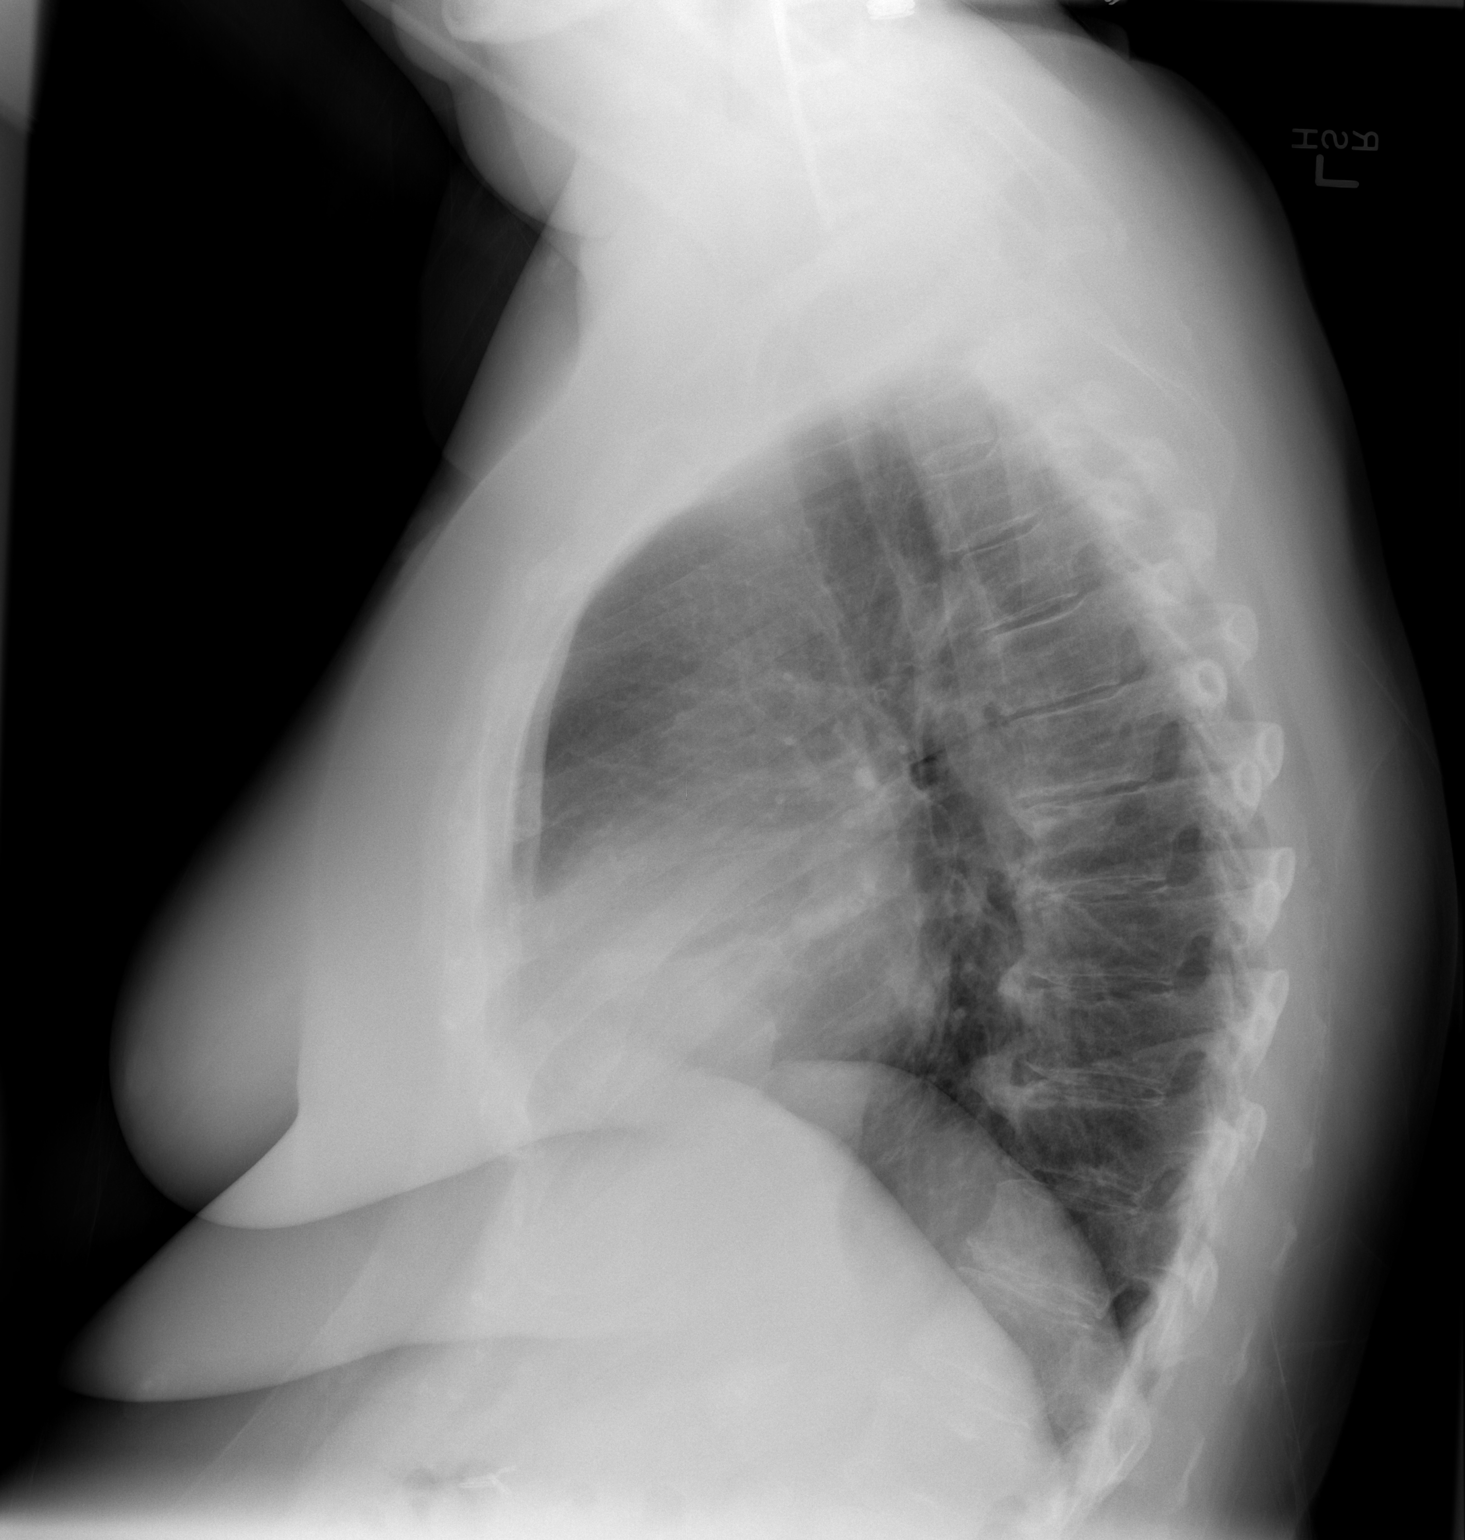

[2 of 2 positions shown; findings below may reference images not displayed]

FINDINGS: Mild left basilar atelectasis or scar is identified. The lungs are
otherwise clear. Heart size is upper normal. No pneumothorax or
pleural effusion.
IMPRESSION: No acute disease.

## 2016-05-24 DIAGNOSIS — G4733 Obstructive sleep apnea (adult) (pediatric): Secondary | ICD-10-CM | POA: Diagnosis not present

## 2016-06-02 DIAGNOSIS — H04123 Dry eye syndrome of bilateral lacrimal glands: Secondary | ICD-10-CM | POA: Diagnosis not present

## 2016-06-02 DIAGNOSIS — Z961 Presence of intraocular lens: Secondary | ICD-10-CM | POA: Diagnosis not present

## 2016-06-02 DIAGNOSIS — E119 Type 2 diabetes mellitus without complications: Secondary | ICD-10-CM | POA: Diagnosis not present

## 2016-06-02 DIAGNOSIS — H401222 Low-tension glaucoma, left eye, moderate stage: Secondary | ICD-10-CM | POA: Diagnosis not present

## 2016-06-02 DIAGNOSIS — H401213 Low-tension glaucoma, right eye, severe stage: Secondary | ICD-10-CM | POA: Diagnosis not present

## 2016-06-24 DIAGNOSIS — K644 Residual hemorrhoidal skin tags: Secondary | ICD-10-CM | POA: Diagnosis not present

## 2016-06-24 DIAGNOSIS — G4733 Obstructive sleep apnea (adult) (pediatric): Secondary | ICD-10-CM | POA: Diagnosis not present

## 2016-06-24 DIAGNOSIS — K625 Hemorrhage of anus and rectum: Secondary | ICD-10-CM | POA: Diagnosis not present

## 2016-07-03 ENCOUNTER — Telehealth: Payer: Self-pay | Admitting: Pharmacist

## 2016-07-03 NOTE — Telephone Encounter (Signed)
Received fax from [facility] that pt scheduled for upcoming colonoscopy on 07/16/16 with request to hold Eliquis. Pt takes Eliquis for afib with CHADS2 score of 2 (HTN, DM) and no history of stroke. Ok to hold Eliquis 24-48 hours prior to procedure. Clearance faxed to [phone].

## 2016-07-13 ENCOUNTER — Telehealth: Payer: Self-pay | Admitting: Internal Medicine

## 2016-07-13 NOTE — Telephone Encounter (Signed)
New message      [facility] did not receive the clearance to hold eliquis prior to colonoscopy.  Please call today and let them know if it is ok.  See 07-03-16 note

## 2016-07-13 NOTE — Telephone Encounter (Signed)
Will fax clearance again.  

## 2016-07-15 ENCOUNTER — Telehealth: Payer: Self-pay | Admitting: Cardiology

## 2016-07-15 NOTE — Telephone Encounter (Signed)
Left message for patient that fax machine number has not changed and [facility]. She understands Toma CopierBethany, CMA will call her tomorrow with an update.

## 2016-07-15 NOTE — Telephone Encounter (Signed)
New message     The pt needs a re-newed prescription for C-[facility]AP [facility], the [facility] had tried to [facility] over everything, the [facility] was wondering if the fax number had changed

## 2016-07-16 ENCOUNTER — Other Ambulatory Visit: Payer: Self-pay

## 2016-07-16 DIAGNOSIS — K649 Unspecified hemorrhoids: Secondary | ICD-10-CM | POA: Diagnosis not present

## 2016-07-16 DIAGNOSIS — K529 Noninfective gastroenteritis and colitis, unspecified: Secondary | ICD-10-CM | POA: Diagnosis not present

## 2016-07-16 DIAGNOSIS — K633 Ulcer of intestine: Secondary | ICD-10-CM | POA: Diagnosis not present

## 2016-07-16 DIAGNOSIS — K5000 Crohn's disease of small intestine without complications: Secondary | ICD-10-CM | POA: Diagnosis not present

## 2016-07-16 DIAGNOSIS — K625 Hemorrhage of anus and rectum: Secondary | ICD-10-CM | POA: Diagnosis not present

## 2016-07-16 NOTE — Telephone Encounter (Signed)
[facility]ing on [facility] once I have the paperwork I will put in in Dr Norris Crossurner's box to be signed.  Patient is aware and was thankful for the call.

## 2016-07-24 DIAGNOSIS — G4733 Obstructive sleep apnea (adult) (pediatric): Secondary | ICD-10-CM | POA: Diagnosis not present

## 2016-07-29 DIAGNOSIS — E785 Hyperlipidemia, unspecified: Secondary | ICD-10-CM | POA: Diagnosis not present

## 2016-07-29 DIAGNOSIS — D559 Anemia due to enzyme disorder, unspecified: Secondary | ICD-10-CM | POA: Diagnosis not present

## 2016-07-29 DIAGNOSIS — I4891 Unspecified atrial fibrillation: Secondary | ICD-10-CM | POA: Diagnosis not present

## 2016-07-29 DIAGNOSIS — E1165 Type 2 diabetes mellitus with hyperglycemia: Secondary | ICD-10-CM | POA: Diagnosis not present

## 2016-07-29 DIAGNOSIS — E118 Type 2 diabetes mellitus with unspecified complications: Secondary | ICD-10-CM | POA: Diagnosis not present

## 2016-07-29 DIAGNOSIS — D510 Vitamin B12 deficiency anemia due to intrinsic factor deficiency: Secondary | ICD-10-CM | POA: Diagnosis not present

## 2016-07-29 DIAGNOSIS — E039 Hypothyroidism, unspecified: Secondary | ICD-10-CM | POA: Diagnosis not present

## 2016-07-29 DIAGNOSIS — Z0000 Encounter for general adult medical examination without abnormal findings: Secondary | ICD-10-CM | POA: Diagnosis not present

## 2016-08-24 DIAGNOSIS — G4733 Obstructive sleep apnea (adult) (pediatric): Secondary | ICD-10-CM | POA: Diagnosis not present

## 2016-09-03 DIAGNOSIS — R197 Diarrhea, unspecified: Secondary | ICD-10-CM | POA: Diagnosis not present

## 2016-09-03 DIAGNOSIS — K649 Unspecified hemorrhoids: Secondary | ICD-10-CM | POA: Diagnosis not present

## 2016-09-24 DIAGNOSIS — G4733 Obstructive sleep apnea (adult) (pediatric): Secondary | ICD-10-CM | POA: Diagnosis not present

## 2016-10-06 DIAGNOSIS — E1165 Type 2 diabetes mellitus with hyperglycemia: Secondary | ICD-10-CM | POA: Diagnosis not present

## 2016-10-06 DIAGNOSIS — I10 Essential (primary) hypertension: Secondary | ICD-10-CM | POA: Diagnosis not present

## 2016-10-13 DIAGNOSIS — M25461 Effusion, right knee: Secondary | ICD-10-CM | POA: Diagnosis not present

## 2016-10-13 DIAGNOSIS — M25561 Pain in right knee: Secondary | ICD-10-CM | POA: Diagnosis not present

## 2016-10-13 DIAGNOSIS — S8981XA Other specified injuries of right lower leg, initial encounter: Secondary | ICD-10-CM | POA: Diagnosis not present

## 2016-10-13 DIAGNOSIS — S838X1A Sprain of other specified parts of right knee, initial encounter: Secondary | ICD-10-CM | POA: Diagnosis not present

## 2016-10-20 DIAGNOSIS — I10 Essential (primary) hypertension: Secondary | ICD-10-CM | POA: Diagnosis not present

## 2016-10-20 DIAGNOSIS — E1165 Type 2 diabetes mellitus with hyperglycemia: Secondary | ICD-10-CM | POA: Diagnosis not present

## 2016-10-20 DIAGNOSIS — I4891 Unspecified atrial fibrillation: Secondary | ICD-10-CM | POA: Diagnosis not present

## 2016-11-05 DIAGNOSIS — S838X1D Sprain of other specified parts of right knee, subsequent encounter: Secondary | ICD-10-CM | POA: Diagnosis not present

## 2016-11-05 DIAGNOSIS — S8981XD Other specified injuries of right lower leg, subsequent encounter: Secondary | ICD-10-CM | POA: Diagnosis not present

## 2016-11-05 DIAGNOSIS — M25561 Pain in right knee: Secondary | ICD-10-CM | POA: Diagnosis not present

## 2016-11-09 DIAGNOSIS — I10 Essential (primary) hypertension: Secondary | ICD-10-CM | POA: Diagnosis not present

## 2016-11-09 DIAGNOSIS — E1165 Type 2 diabetes mellitus with hyperglycemia: Secondary | ICD-10-CM | POA: Diagnosis not present

## 2016-12-02 DIAGNOSIS — I10 Essential (primary) hypertension: Secondary | ICD-10-CM | POA: Diagnosis not present

## 2016-12-02 DIAGNOSIS — E1165 Type 2 diabetes mellitus with hyperglycemia: Secondary | ICD-10-CM | POA: Diagnosis not present

## 2016-12-02 DIAGNOSIS — S51811A Laceration without foreign body of right forearm, initial encounter: Secondary | ICD-10-CM | POA: Diagnosis not present

## 2016-12-08 DIAGNOSIS — S51811D Laceration without foreign body of right forearm, subsequent encounter: Secondary | ICD-10-CM | POA: Diagnosis not present

## 2016-12-15 DIAGNOSIS — S51812D Laceration without foreign body of left forearm, subsequent encounter: Secondary | ICD-10-CM | POA: Diagnosis not present

## 2017-01-29 ENCOUNTER — Ambulatory Visit (INDEPENDENT_AMBULATORY_CARE_PROVIDER_SITE_OTHER): Payer: Medicare Other | Admitting: Physician Assistant

## 2017-01-29 VITALS — BP 136/70 | Ht 65.0 in | Wt 206.0 lb

## 2017-01-29 DIAGNOSIS — Z01818 Encounter for other preprocedural examination: Secondary | ICD-10-CM | POA: Diagnosis not present

## 2017-01-29 DIAGNOSIS — I480 Paroxysmal atrial fibrillation: Secondary | ICD-10-CM | POA: Diagnosis not present

## 2017-01-29 DIAGNOSIS — I10 Essential (primary) hypertension: Secondary | ICD-10-CM | POA: Diagnosis not present

## 2017-01-29 DIAGNOSIS — I447 Left bundle-branch block, unspecified: Secondary | ICD-10-CM

## 2017-01-29 NOTE — Progress Notes (Signed)
Cardiology Office Note Date:  01/29/2017  Patient ID:  Destiny Brown [birth year], MRN [id number] PCP:  Enrique Sack, MD  Cardiologist:  Dr. Graciela Husbands    Chief Complaint: knee surgery, pre-op evaluation  History of Present Illness: Destiny Brown is a 71 y.o. female with history of paroxysmal Afib, known LBBB, HTN, hypothyroid, OSA w/CPAP comes to the office today to be seen for Dr. Graciela Husbands.  She was last seen by him jan 2017, at that time doing well without changes to her regime.  She reports feeling very well.  She denies any kind of CP, palpitations or SOB, no dizziness, near syncope or syncope.  She is pending to schedule a R TKA, pending her A1c next week hoping to get done in the next few weeks.  She has not perceived any symptoms of AFib, states when first found was an unusual sense of weakness, "just knew something was wrong", she has never had this feeling again.  She denies any bleeding or signs of bleeding.  She has labs done twice a year with her PMD, and a full physical and "full labs" done once a year.   AFib hx: Dx 2015 w/LBBB at that time AAD tx: no   Past Medical History:  Diagnosis Date  . Arthritis   . Atrial fibrillation with RVR (HCC) 09/21/2014  . Bronchitis    h/o  . Diabetes mellitus    NO MEDS,  DIET CONTROLLED  . Headache(784.0)   . History of migraines    "when working under alot of stress"  . Hypertension   . Hypothyroid   . LBBB (left bundle branch block) 09/22/2014  . OSA (obstructive sleep apnea) 04/09/2015   Moderate OSA with AHI 16.7 events per hour with successful CPAP titration to 8cm H2O  . PONV (postoperative nausea and vomiting)    PT DOESNOT WANT TO TAKE HER ATENOLOL     DOS.   " IT DROPS MY BP TO LOW"    Past Surgical History:  Procedure Laterality Date  . ABDOMINAL HYSTERECTOMY  03/2001  . APPENDECTOMY    . BACK SURGERY    . CATARACT EXTRACTION, BILATERAL     WITH IMPLANTS  . CERVICAL FUSION  08/2003  . CHOLECYSTECTOMY  03/2010   . LUMBAR DISC SURGERY    . LUMBAR LAMINECTOMY/DECOMPRESSION MICRODISCECTOMY  10/2010  . POSTERIOR FUSION LUMBAR SPINE  12/16/11; 12/2005; 07/2004   L2-3; L3-4; L4-5    Current Outpatient Prescriptions  Medication Sig Dispense Refill  . ACCU-CHEK FASTCLIX LANCETS MISC 1 each by Other route 2 (two) times daily. Use 1 lancet bid    . ACCU-CHEK SMARTVIEW test strip 1 strip by Other route 2 (two) times daily. Use 1 strip to check glucose twice a day    . amLODipine (NORVASC) 5 MG tablet Take 5 mg by mouth daily.    Marland Kitchen apixaban (ELIQUIS) 5 MG TABS tablet Take 1 tablet (5 mg total) by mouth 2 (two) times daily. 180 tablet 1  . brimonidine (ALPHAGAN) 0.15 % ophthalmic solution Place 1 drop into the right eye 2 (two) times daily.    . Cholecalciferol (VITAMIN D3) 1000 UNITS CAPS Take 1,000 Units by mouth daily.      . dorzolamide-timolol (COSOPT) 22.3-6.8 MG/ML ophthalmic solution Place 1 drop into the right eye 2 (two) times daily.  3  . hydrochlorothiazide (HYDRODIURIL) 25 MG tablet Take 25 mg by mouth daily.    Marland Kitchen latanoprost (XALATAN) 0.005 % ophthalmic solution Place 1  drop into both eyes daily.    Marland Kitchen. levothyroxine (SYNTHROID, LEVOTHROID) 125 MCG tablet Take 125 mcg by mouth daily.     Marland Kitchen. losartan (COZAAR) 100 MG tablet Take 100 mg by mouth daily.    . metFORMIN (GLUCOPHAGE) 500 MG tablet Take 500 mg by mouth 2 (two) times daily.     . predniSONE (DELTASONE) 5 MG tablet Take 2.5 mg by mouth daily with breakfast.      No current facility-administered medications for this visit.     Allergies:   Codeine and Percocet [oxycodone-acetaminophen]   Social History:  The patient  reports that she has never smoked. She has never used smokeless tobacco. She reports that she does not drink alcohol or use drugs.   Family History:  The patient's She was adopted. Family history is unknown by patient.  ROS:  Please see the history of present illness.   All other systems are reviewed and otherwise negative.     PHYSICAL EXAM:  VS:  BP 136/70   Ht 5\' 5"  (1.651 m)   Wt 206 lb (93.4 kg)   BMI 34.28 kg/m  BMI: Body mass index is 34.28 kg/m. Well nourished, well developed, in no acute distress  HEENT: normocephalic, atraumatic  Neck: no JVD, carotid bruits or masses Cardiac:  RRR; no significant murmurs, no rubs, or gallops Lungs:   CTA b/l, no wheezing, rhonchi or rales  Abd: soft, nontender MS: no deformity or atrophy Ext:  Noedema  Skin: warm and dry, no rash Neuro:  No gross deficits appreciated Psych: euthymic mood, full affect   EKG:  Done today and reviewed by myself w/Dr. End (DOD)  Shows SR 61bpm, LBBB, PR 132ms, QRS 146ms, QTc 455ms   09/22/14: TTE Study Conclusions - Left ventricle: The cavity size was normal. There was mild concentric hypertrophy. Systolic function was normal. The estimated ejection fraction was in the range of 50% to 55%. Wall motion was normal; there were no regional wall motion abnormalities. Doppler parameters are consistent with abnormal left ventricular relaxation (grade 1 diastolic dysfunction). - Aortic valve: Trileaflet; normal thickness leaflets. There was no regurgitation. - Aortic root: The aortic root was normal in size. - Mitral valve: There was mild regurgitation. - Left atrium: The atrium was normal in size. - Right ventricle: Systolic function was normal. - Right atrium: The atrium was normal in size. - Tricuspid valve: There was trivial regurgitation. - Pulmonary arteries: Systolic pressure was within the normal range. Impressions - Normal biventricular size and systolic function. Abnormal relaxation with normal filling pressures. Mild mitral and tricuspid regurgitation. Normal RVSP. Normal left atrial size.  09/23/14: Lexiscan stress  IMPRESSION: 1. No definite reversible ischemia or infarction. 2. Normal left ventricular wall motion. 3. Left ventricular ejection fraction 55% 4. Low-risk stress test  findings*.   Recent Labs: No results found for requested labs within last 8760 hours.  No results found for requested labs within last 8760 hours.   CrCl cannot be calculated (Patient's most recent lab result is older than the maximum 21 days allowed.).   Wt Readings from Last 3 Encounters:  01/29/17 206 lb (93.4 kg)  01/24/16 204 lb 12.8 oz (92.9 kg)  01/24/16 204 lb 12.8 oz (92.9 kg)     Other studies reviewed: Additional studies/records reviewed today include: summarized above  ASSESSMENT AND PLAN:  1. Paroxysmal AFib     CHA2DS2Vasc is 4, on Eliquis     Patient reports labs done bi-annually with her PMD, we have asked  her to request a copy of most recent be sent to [place]  2. HTN     Stable, no changes  3. Known LBBB     Negative ischemic evaluation with LVEF 50-55%  4. Pending right TKA  Case was reviewed with Dr. Okey Dupre, (DOD), he spoke with the patient as well, she is considered low cardiac risk for her planned knee surgery.  Recommend holding Eliquis 48 hours prior to surgery, to resume post-op when cleared by the surgeon.  Disposition: F/u with Dr. Graciela Husbands, sooner if needed.  Current medicines are reviewed at length with the patient today.  The patient did not have any concerns regarding medicines.  Judith Blonder, PA-C 01/29/2017 12:07 PM     [facility] [address] [place] [address] [phone] (office)  [phone] (fax)

## 2017-01-29 NOTE — Patient Instructions (Addendum)
Medication Instructions:   Your physician recommends that you continue on your current medications as directed. Please refer to the Current Medication list given to you today.   If you need a refill on your cardiac medications before your next appointment, please call your pharmacy.  Labwork: NONE ORDERED  TODAY    Testing/Procedures: NONE ORDERED  TODAY    Follow-Up: Your physician wants you to follow-up in: ONE YEAR WITH  KLEIN   You will receive a reminder letter in the mail two months in advance. If you don't receive a letter, please call our office to schedule the follow-up appointment.     Any Other Special Instructions Will Be Listed Below (If Applicable).                                                                                                                                                    

## 2017-02-12 ENCOUNTER — Ambulatory Visit: Payer: Self-pay | Admitting: Orthopedic Surgery

## 2017-02-16 ENCOUNTER — Other Ambulatory Visit: Payer: Self-pay | Admitting: Internal Medicine

## 2017-02-16 DIAGNOSIS — Z1231 Encounter for screening mammogram for malignant neoplasm of breast: Secondary | ICD-10-CM

## 2017-03-03 ENCOUNTER — Ambulatory Visit
Admission: RE | Admit: 2017-03-03 | Discharge: 2017-03-03 | Disposition: A | Discharge status: Home / Self Care | Payer: Medicare Other | Source: Ambulatory Visit | Attending: Internal Medicine | Admitting: Internal Medicine

## 2017-03-03 DIAGNOSIS — Z1231 Encounter for screening mammogram for malignant neoplasm of breast: Secondary | ICD-10-CM

## 2017-03-08 NOTE — H&P (Signed)
TOTAL KNEE ADMISSION H&P  Patient is being admitted for right total knee arthroplasty.  Subjective:  Chief Complaint:right knee pain.  HPI: Destiny Brown, 71 y.o. female, has a history of pain and functional disability in the right knee due to arthritis and has failed non-surgical conservative treatments for greater than 12 weeks to includeNSAID's and/or analgesics, corticosteriod injections, weight reduction as appropriate and activity modification.  Onset of symptoms was gradual, starting 3 years ago with gradually worsening course since that time. The patient noted no past surgery on the right knee(s).  Patient currently rates pain in the right knee(s) at 9 out of 10 with activity. Patient has worsening of pain with activity and weight bearing, pain that interferes with activities of daily living, pain with passive range of motion and joint swelling.  Patient has evidence of subchondral sclerosis and joint space narrowing by imaging studies. There is no active infection.  Patient Active Problem List   Diagnosis Date Noted  . Diplopia 01/20/2016  . Obesity (BMI 30-39.9) 06/26/2015  . OSA (obstructive sleep apnea) 04/09/2015  . LBBB (left bundle branch block) 09/22/2014  . Elevated troponin 09/22/2014  . Atrial fibrillation with RVR (HCC) 09/21/2014  . Diabetes type 2, controlled (HCC) 09/21/2014  . Migraine headache 09/21/2014  . Hypothyroid   . Hypertension   . 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 Arthritis   . Atrial fibrillation with RVR (HCC) 09/21/2014  . Bronchitis    h/o  . Diabetes mellitus    NO MEDS,  DIET CONTROLLED  . Headache(784.0)   . History of migraines    "when working under alot of stress"  . Hypertension   . Hypothyroid   . LBBB (left bundle branch block) 09/22/2014  . OSA (obstructive sleep apnea) 04/09/2015   Moderate OSA with AHI 16.7 events per hour with successful CPAP titration to 8cm H2O  .  PONV (postoperative nausea and vomiting)    PT DOESNOT WANT TO TAKE HER ATENOLOL     DOS.   " IT DROPS MY BP TO LOW"    Past Surgical History:  Procedure Laterality Date  . ABDOMINAL HYSTERECTOMY  03/2001  . APPENDECTOMY    . BACK SURGERY    . CATARACT EXTRACTION, BILATERAL     WITH IMPLANTS  . CERVICAL FUSION  08/2003  . CHOLECYSTECTOMY  03/2010  . LUMBAR DISC SURGERY    . LUMBAR LAMINECTOMY/DECOMPRESSION MICRODISCECTOMY  10/2010  . POSTERIOR FUSION LUMBAR SPINE  12/16/11; 12/2005; 07/2004   L2-3; L3-4; L4-5    No prescriptions prior to admission.   Allergies  Allergen Reactions  . Codeine Other (See Comments)    Severe Headaches with codeine; but not with other opioids  . Percocet [Oxycodone-Acetaminophen] Other (See Comments)    hallucinations    Social History  Substance Use Topics  . Smoking status: Never Smoker  . Smokeless tobacco: Never Used  . Alcohol use No    Family History  Problem Relation Age of Onset  . Adopted: Yes  . Family history unknown: Yes     Review of Systems  Constitutional: Negative.   HENT: Negative.   Eyes: Negative.   Respiratory: Negative for shortness of breath.   Cardiovascular: Negative for chest pain.  Gastrointestinal: Negative.   Genitourinary: Negative.   Musculoskeletal: Positive for joint pain.  Skin: Negative.   Neurological: Negative.   Endo/Heme/Allergies: Negative.   Psychiatric/Behavioral: Negative.     Objective:  Physical  Exam  Constitutional: She is oriented to person, place, and time. She appears well-developed.  HENT:  Head: Normocephalic.  Eyes: Pupils are equal, round, and reactive to light.  Neck: Normal range of motion.  Cardiovascular: Normal heart sounds and intact distal pulses.   Respiratory: Effort normal.  GI: Soft.  Genitourinary:  Genitourinary Comments: Deferred  Musculoskeletal:  Right knee pain with rom. RLE grossly n/v intact.  Neurological: She is alert and oriented to person, place, and  time. She has normal reflexes.  Skin: Skin is warm and dry.  Psychiatric: Her behavior is normal.    Vital signs in last 24 hours: BP: ()/()  Arterial Line BP: ()/()   Labs:   Estimated body mass index is 34.28 kg/m as calculated from the following:   Height as of 01/29/17: 5\' 5"  (1.651 m).   Weight as of 01/29/17: 93.4 kg (206 lb).   Imaging Review Plain radiographs demonstrate moderate degenerative joint disease of the right knee(s). The overall alignment ismild varus. The bone quality appears to be good for age and reported activity level.  Assessment/Plan:  End stage arthritis, right knee   The patient history, physical examination, clinical judgment of the provider and imaging studies are consistent with end stage degenerative joint disease of the right knee(s) and total knee arthroplasty is deemed medically necessary. The treatment options including medical management, injection therapy arthroscopy and arthroplasty were discussed at length. The risks and benefits of total knee arthroplasty were presented and reviewed. The risks due to aseptic loosening, infection, stiffness, patella tracking problems, thromboembolic complications and other imponderables were discussed. The patient acknowledged the explanation, agreed to proceed with the plan and consent was signed. Patient is being admitted for inpatient treatment for surgery, pain control, PT, OT, prophylactic antibiotics, VTE prophylaxis, progressive ambulation and ADL's and discharge planning. The patient is planning to be discharged home with home health services.

## 2017-03-09 NOTE — Patient Instructions (Signed)
Arnell SievingSharon A Helbert  03/09/2017   Your procedure is scheduled on: Thursday 03/18/2017  [facility]  [facility]ance [facility]  elevators to 3rd floor to  Short Stay Center at   1000 AM.  Call this number if you have problems the morning of surgery [phone]   Remember: ONLY 1 PERSON MAY GO WITH YOU TO SHORT STAY TO GET  READY MORNING OF YOUR SURGERY.  How to Manage Your Diabetes Before and After Surgery  Why is it important to control my blood sugar before and after surgery? . Improving blood sugar levels before and after surgery helps healing and can limit problems. . A way of improving blood sugar control is eating a healthy diet by: o  Eating less sugar and carbohydrates o  Increasing activity/exercise o  Talking with your doctor about reaching your blood sugar goals . High blood sugars (greater than 180 mg/dL) can raise your risk of infections and slow your recovery, so you will need to focus on controlling your diabetes during the weeks before surgery. . Make sure that the doctor who takes care of your diabetes knows about your planned surgery including the date and location.  How do I manage my blood sugar before surgery? . Check your blood sugar at least 4 times a day, starting 2 days before surgery, to make sure that the level is not too high or low. o Check your blood sugar the morning of your surgery when you wake up and every 2 hours until you get to the Short Stay unit. . If your blood sugar is less than 70 mg/dL, you will need to treat for low blood sugar: o Do not take insulin. o Treat a low blood sugar (less than 70 mg/dL) with  cup of clear juice (cranberry or apple), 4 glucose tablets, OR glucose gel. o Recheck blood sugar in 15 minutes after treatment (to make sure it is greater than 70 mg/dL). If your blood sugar is not greater than 70 mg/dL on recheck, call [phone] for further instructions. . Report your blood sugar to the short  stay nurse when you get to Short Stay.  . If you are admitted to the hospital after surgery: o Your blood sugar will be checked by the staff and you will probably be given insulin after surgery (instead of oral diabetes medicines) to make sure you have good blood sugar levels. o The goal for blood sugar control after surgery is 80-180 mg/dL.   WHAT DO I DO ABOUT MY DIABETES MEDICATION?        TAKE THE DAY BEFORE SURGERY THE  MORNING DOSE OF METFORMIN (GLUCOPHAGE ) ONLY ! NO EVENING DOSE !   . Do not take oral diabetes medicines (pills) the morning of surgery.      Do not eat food or drink liquids :After Midnight.     Take these medicines the morning of surgery with A SIP OF WATER: Amlodipine (Norvasc), Levothyroxine (Synthroid), Prednisone (Deltasone)                                 You may not have any metal on your body including hair pins and              piercings  Do not wear jewelry, make-up, lotions, powders or perfumes, deodorant  Do not wear nail polish.  Do not shave  48 hours prior to surgery.              Men may shave face and neck.   Do not bring valuables to the hospital.  IS NOT             RESPONSIBLE   FOR VALUABLES.  Contacts, dentures or bridgework may not be worn into surgery.  Leave suitcase in the car. After surgery it may be brought to your room.                  Please read over the following fact sheets you were given: _____________________________________________________________________             [facility] - Preparing for Surgery Before surgery, you can play an important role.  Because skin is not sterile, your skin needs to be as free of germs as possible.  You can reduce the number of germs on your skin by washing with CHG (chlorahexidine gluconate) soap before surgery.  CHG is an antiseptic cleaner which kills germs and bonds with the skin to continue killing germs even after washing. Please DO NOT use if you have an  allergy to CHG or antibacterial soaps.  If your skin becomes reddened/irritated stop using the CHG and inform your nurse when you arrive at Short Stay. Do not shave (including legs and underarms) for at least 48 hours prior to the first CHG shower.  You may shave your face/neck. Please follow these instructions carefully:  1.  Shower with CHG Soap the night before surgery and the  morning of Surgery.  2.  If you choose to wash your hair, wash your hair first as usual with your  normal  shampoo.  3.  After you shampoo, rinse your hair and body thoroughly to remove the  shampoo.                           4.  Use CHG as you would any other liquid soap.  You can apply chg directly  to the skin and wash                       Gently with a scrungie or clean washcloth.  5.  Apply the CHG Soap to your body ONLY FROM THE NECK DOWN.   Do not use on face/ open                           Wound or open sores. Avoid contact with eyes, ears mouth and genitals (private parts).                       Wash face,  Genitals (private parts) with your normal soap.             6.  Wash thoroughly, paying special attention to the area where your surgery  will be performed.  7.  Thoroughly rinse your body with warm water from the neck down.  8.  DO NOT shower/wash with your normal soap after using and rinsing off  the CHG Soap.                9.  Pat yourself dry with a clean towel.            10.  Wear clean pajamas.  11.  Place clean sheets on your bed the night of your first shower and do not  sleep with pets. Day of Surgery : Do not apply any lotions/deodorants the morning of surgery.  Please wear clean clothes to the hospital/surgery center.  FAILURE TO FOLLOW THESE INSTRUCTIONS MAY RESULT IN THE CANCELLATION OF YOUR SURGERY PATIENT SIGNATURE_________________________________  NURSE  SIGNATURE__________________________________  ________________________________________________________________________   Adam Phenix  An incentive spirometer is a tool that can help keep your lungs clear and active. This tool measures how well you are filling your lungs with each breath. Taking long deep breaths may help reverse or decrease the chance of developing breathing (pulmonary) problems (especially infection) following:  A long period of time when you are unable to move or be active. BEFORE THE PROCEDURE   If the spirometer includes an indicator to show your [place] effort, your nurse or respiratory therapist will set it to a desired goal.  If possible, sit up straight or lean slightly forward. Try not to slouch.  Hold the incentive spirometer in an upright position. INSTRUCTIONS FOR USE  1. Sit on the edge of your bed if possible, or sit up as far as you can in bed or on a chair. 2. Hold the incentive spirometer in an upright position. 3. Breathe out normally. 4. Place the mouthpiece in your mouth and seal your lips tightly around it. 5. Breathe in slowly and as deeply as possible, raising the piston or the ball toward the top of the column. 6. Hold your breath for 3-5 seconds or for as long as possible. Allow the piston or ball to fall to the bottom of the column. 7. Remove the mouthpiece from your mouth and breathe out normally. 8. Rest for a few seconds and repeat Steps 1 through 7 at least 10 times every 1-2 hours when you are awake. Take your time and take a few normal breaths between deep breaths. 9. The spirometer may include an indicator to show your [place] effort. Use the indicator as a goal to work toward during each repetition. 10. After each set of 10 deep breaths, practice coughing to be sure your lungs are clear. If you have an incision (the cut made at the time of surgery), support your incision when coughing by placing a pillow or rolled up towels firmly  against it. Once you are able to get out of bed, walk around indoors and cough well. You may stop using the incentive spirometer when instructed by your caregiver.  RISKS AND COMPLICATIONS  Take your time so you do not get dizzy or light-headed.  If you are in pain, you may need to take or ask for pain medication before doing incentive spirometry. It is harder to take a deep breath if you are having pain. AFTER USE  Rest and breathe slowly and easily.  It can be helpful to keep track of a log of your progress. Your caregiver can provide you with a simple table to help with this. If you are using the spirometer at home, follow these instructions: [place] IF:   You are having difficultly using the spirometer.  You have trouble using the spirometer as often as instructed.  Your pain medication is not giving enough relief while using the spirometer.  You develop fever of 100.5 F (38.1 C) or higher. SEEK IMMEDIATE MEDICAL CARE IF:   You cough up bloody sputum that had not been present before.  You develop fever of 102 F (38.9 C)  or greater.  You develop worsening pain at or near the incision site. MAKE SURE YOU:   Understand these instructions.  Will watch your condition.  Will get help right away if you are not doing well or get worse. Document Released: 04/26/2007 Document Revised: 03/07/2012 Document Reviewed: 06/27/2007 Exit[employer] Patient Information 2014 [employer], [place].   ________________________________________________________________________  WHAT IS A BLOOD TRANSFUSION? Blood Transfusion Information  A transfusion is the replacement of blood or some of its parts. Blood is made up of multiple cells which provide different functions.  Red blood cells carry oxygen and are used for blood loss replacement.  White blood cells fight against infection.  Platelets control bleeding.  Plasma helps clot blood.  Other blood products are available for  specialized needs, such as hemophilia or other clotting disorders. BEFORE THE TRANSFUSION  Who gives blood for transfusions?   Healthy volunteers who are fully evaluated to make sure their blood is safe. This is blood bank blood. Transfusion therapy is the safest it has ever been in the practice of medicine. Before blood is taken from a donor, a complete history is taken to make sure that person has no history of diseases nor engages in risky social behavior (examples are intravenous drug use or sexual activity with multiple partners). The donor's travel history is screened to minimize risk of transmitting infections, such as malaria. The donated blood is tested for signs of infectious diseases, such as HIV and hepatitis. The blood is then tested to be sure it is compatible with you in order to minimize the chance of a transfusion reaction. If you or a relative donates blood, this is often done in anticipation of surgery and is not appropriate for emergency situations. It takes many days to process the donated blood. RISKS AND COMPLICATIONS Although transfusion therapy is very safe and saves many lives, the main dangers of transfusion include:   Getting an infectious disease.  Developing a transfusion reaction. This is an allergic reaction to something in the blood you were given. Every precaution is taken to prevent this. The decision to have a blood transfusion has been considered carefully by your caregiver before blood is given. Blood is not given unless the benefits outweigh the risks. AFTER THE TRANSFUSION  Right after receiving a blood transfusion, you will usually feel much better and more energetic. This is especially true if your red blood cells have gotten low (anemic). The transfusion raises the level of the red blood cells which carry oxygen, and this usually causes an energy increase.  The nurse administering the transfusion will monitor you carefully for complications. HOME CARE  INSTRUCTIONS  No special instructions are needed after a transfusion. You may find your energy is better. Speak with your caregiver about any limitations on activity for underlying diseases you may have. SEEK MEDICAL CARE IF:   Your condition is not improving after your transfusion.  You develop redness or irritation at the intravenous (IV) site. SEEK IMMEDIATE MEDICAL CARE IF:  Any of the following symptoms occur over the next 12 hours:  Shaking chills.  You have a temperature by mouth above 102 F (38.9 C), not controlled by medicine.  Chest, back, or muscle pain.  People around you feel you are not acting correctly or are confused.  Shortness of breath or difficulty breathing.  Dizziness and fainting.  You get a rash or develop hives.  You have a decrease in urine output.  Your urine turns a dark color or changes to pink, red,  or brown. Any of the following symptoms occur over the next 10 days:  You have a temperature by mouth above 102 F (38.9 C), not controlled by medicine.  Shortness of breath.  Weakness after normal activity.  The white part of the eye turns yellow (jaundice).  You have a decrease in the amount of urine or are urinating less often.  Your urine turns a dark color or changes to pink, red, or brown. Document Released: 12/11/2000 Document Revised: 03/07/2012 Document Reviewed: 07/30/2008 [facility] Patient Information 2014 [place], [place].  _______________________________________________________________________

## 2017-03-10 ENCOUNTER — Encounter (HOSPITAL_COMMUNITY)
Admission: RE | Admit: 2017-03-10 | Discharge: 2017-03-10 | Disposition: A | Discharge status: Home / Self Care | Payer: Medicare Other | Source: Ambulatory Visit | Attending: Specialist | Admitting: Specialist

## 2017-03-10 ENCOUNTER — Encounter (HOSPITAL_COMMUNITY): Payer: Self-pay

## 2017-03-10 DIAGNOSIS — M1711 Unilateral primary osteoarthritis, right knee: Secondary | ICD-10-CM | POA: Diagnosis not present

## 2017-03-10 DIAGNOSIS — Z01812 Encounter for preprocedural laboratory examination: Secondary | ICD-10-CM | POA: Insufficient documentation

## 2017-03-10 LAB — URINALYSIS, ROUTINE W REFLEX MICROSCOPIC
Bilirubin Urine: NEGATIVE
Glucose, UA: 50 mg/dL — AB
Ketones, ur: NEGATIVE mg/dL
Nitrite: NEGATIVE
PROTEIN: NEGATIVE mg/dL
SPECIFIC GRAVITY, URINE: 1.011 (ref 1.005–1.030)
pH: 5 (ref 5.0–8.0)

## 2017-03-10 LAB — BASIC METABOLIC PANEL
ANION GAP: 9 (ref 5–15)
BUN: 22 mg/dL — ABNORMAL HIGH (ref 6–20)
CALCIUM: 9.6 mg/dL (ref 8.9–10.3)
CHLORIDE: 103 mmol/L (ref 101–111)
CO2: 25 mmol/L (ref 22–32)
Creatinine, Ser: 1.24 mg/dL — ABNORMAL HIGH (ref 0.44–1.00)
GFR calc non Af Amer: 42 mL/min — ABNORMAL LOW (ref >60)
GFR, EST AFRICAN AMERICAN: 49 mL/min — AB (ref >60)
Glucose, Bld: 225 mg/dL — ABNORMAL HIGH (ref 65–99)
Potassium: 4 mmol/L (ref 3.5–5.1)
SODIUM: 137 mmol/L (ref 135–145)

## 2017-03-10 LAB — CBC
HCT: 36.1 % (ref 36.0–46.0)
HEMOGLOBIN: 12.1 g/dL (ref 12.0–15.0)
MCH: 29.5 pg (ref 26.0–34.0)
MCHC: 33.5 g/dL (ref 30.0–36.0)
MCV: 88 fL (ref 78.0–100.0)
PLATELETS: 204 10*3/uL (ref 150–400)
RBC: 4.1 MIL/uL (ref 3.87–5.11)
RDW: 13.2 % (ref 11.5–15.5)
WBC: 6.5 10*3/uL (ref 4.0–10.5)

## 2017-03-10 LAB — GLUCOSE, CAPILLARY: Glucose-Capillary: 205 mg/dL — ABNORMAL HIGH (ref 65–99)

## 2017-03-10 LAB — PROTIME-INR
INR: 1.19
PROTHROMBIN TIME: 15.1 s (ref 11.4–15.2)

## 2017-03-10 LAB — SURGICAL PCR SCREEN
MRSA, PCR: NEGATIVE
STAPHYLOCOCCUS AUREUS: NEGATIVE

## 2017-03-10 LAB — ABO/RH: ABO/RH(D): A POS

## 2017-03-10 LAB — APTT: aPTT: 30 seconds (ref 24–36)

## 2017-03-15 ENCOUNTER — Encounter (HOSPITAL_COMMUNITY)
Admission: RE | Admit: 2017-03-15 | Discharge: 2017-03-15 | Disposition: A | Discharge status: Home / Self Care | Payer: Medicare Other | Source: Ambulatory Visit | Attending: Specialist | Admitting: Specialist

## 2017-03-15 LAB — URINALYSIS, ROUTINE W REFLEX MICROSCOPIC
Bilirubin Urine: NEGATIVE
Glucose, UA: 50 mg/dL — AB
Hgb urine dipstick: NEGATIVE
KETONES UR: NEGATIVE mg/dL
LEUKOCYTES UA: NEGATIVE
NITRITE: NEGATIVE
PROTEIN: NEGATIVE mg/dL
Specific Gravity, Urine: 1.01 (ref 1.005–1.030)
pH: 7 (ref 5.0–8.0)

## 2017-03-16 ENCOUNTER — Inpatient Hospital Stay (HOSPITAL_COMMUNITY): Admission: RE | Admit: 2017-03-16 | Payer: Medicare Other | Source: Ambulatory Visit

## 2017-03-18 ENCOUNTER — Inpatient Hospital Stay (HOSPITAL_COMMUNITY)
Admission: RE | Admit: 2017-03-18 | Discharge: 2017-03-20 | DRG: 470 | Disposition: A | Discharge status: Home Health | Payer: Medicare Other | Source: Ambulatory Visit | Attending: Specialist | Admitting: Specialist

## 2017-03-18 ENCOUNTER — Encounter (HOSPITAL_COMMUNITY): Payer: Self-pay | Admitting: *Deleted

## 2017-03-18 ENCOUNTER — Inpatient Hospital Stay (HOSPITAL_COMMUNITY): Payer: Medicare Other | Admitting: Anesthesiology

## 2017-03-18 ENCOUNTER — Encounter (HOSPITAL_COMMUNITY)
Admission: RE | Disposition: A | Discharge status: Home Health | Payer: Self-pay | Source: Ambulatory Visit | Attending: Specialist

## 2017-03-18 DIAGNOSIS — E669 Obesity, unspecified: Secondary | ICD-10-CM | POA: Diagnosis present

## 2017-03-18 DIAGNOSIS — M1711 Unilateral primary osteoarthritis, right knee: Principal | ICD-10-CM | POA: Diagnosis present

## 2017-03-18 DIAGNOSIS — Z885 Allergy status to narcotic agent status: Secondary | ICD-10-CM | POA: Diagnosis not present

## 2017-03-18 DIAGNOSIS — Z7952 Long term (current) use of systemic steroids: Secondary | ICD-10-CM | POA: Diagnosis not present

## 2017-03-18 DIAGNOSIS — I4891 Unspecified atrial fibrillation: Secondary | ICD-10-CM | POA: Diagnosis present

## 2017-03-18 DIAGNOSIS — Z6834 Body mass index (BMI) 34.0-34.9, adult: Secondary | ICD-10-CM

## 2017-03-18 DIAGNOSIS — Z79899 Other long term (current) drug therapy: Secondary | ICD-10-CM | POA: Diagnosis not present

## 2017-03-18 DIAGNOSIS — E039 Hypothyroidism, unspecified: Secondary | ICD-10-CM | POA: Diagnosis present

## 2017-03-18 DIAGNOSIS — G4733 Obstructive sleep apnea (adult) (pediatric): Secondary | ICD-10-CM | POA: Diagnosis present

## 2017-03-18 DIAGNOSIS — Z7984 Long term (current) use of oral hypoglycemic drugs: Secondary | ICD-10-CM

## 2017-03-18 DIAGNOSIS — E119 Type 2 diabetes mellitus without complications: Secondary | ICD-10-CM | POA: Diagnosis present

## 2017-03-18 DIAGNOSIS — Z96659 Presence of unspecified artificial knee joint: Secondary | ICD-10-CM

## 2017-03-18 DIAGNOSIS — I447 Left bundle-branch block, unspecified: Secondary | ICD-10-CM | POA: Diagnosis present

## 2017-03-18 DIAGNOSIS — I10 Essential (primary) hypertension: Secondary | ICD-10-CM | POA: Diagnosis present

## 2017-03-18 HISTORY — PX: TOTAL KNEE ARTHROPLASTY: SHX125

## 2017-03-18 LAB — TYPE AND SCREEN
ABO/RH(D): A POS
ANTIBODY SCREEN: NEGATIVE

## 2017-03-18 LAB — GLUCOSE, CAPILLARY
Glucose-Capillary: 167 mg/dL — ABNORMAL HIGH (ref 65–99)
Glucose-Capillary: 163 mg/dL — ABNORMAL HIGH (ref 65–99)
Glucose-Capillary: 191 mg/dL — ABNORMAL HIGH (ref 65–99)

## 2017-03-18 SURGERY — ARTHROPLASTY, KNEE, TOTAL
Anesthesia: Spinal | Site: Knee | Laterality: Right

## 2017-03-18 MED ORDER — MAGNESIUM CITRATE PO SOLN: 1.0000 | Freq: Once | ORAL | Status: DC | PRN

## 2017-03-18 MED ORDER — ONDANSETRON HCL 4 MG/2ML IJ SOLN
4.0000 mg | Freq: Four times a day (QID) | INTRAMUSCULAR | Status: DC | PRN
  Administered 2017-03-18: 4 mg via INTRAVENOUS
  Filled 2017-03-18: qty 2

## 2017-03-18 MED ORDER — BUPIVACAINE-EPINEPHRINE 0.25% -1:200000 IJ SOLN
INTRAMUSCULAR | Status: AC
  Filled 2017-03-18: qty 1

## 2017-03-18 MED ORDER — PROPOFOL 10 MG/ML IV BOLUS
INTRAVENOUS | Status: AC
  Filled 2017-03-18: qty 40

## 2017-03-18 MED ORDER — BISACODYL 5 MG PO TBEC: 5.0000 mg | DELAYED_RELEASE_TABLET | Freq: Every day | ORAL | Status: DC | PRN

## 2017-03-18 MED ORDER — POLYETHYLENE GLYCOL 3350 17 G PO PACK: 17.0000 g | PACK | Freq: Every day | ORAL | Status: DC | PRN

## 2017-03-18 MED ORDER — MIDAZOLAM HCL 2 MG/2ML IJ SOLN
INTRAMUSCULAR | Status: AC
  Filled 2017-03-18: qty 2

## 2017-03-18 MED ORDER — DIPHENHYDRAMINE HCL 12.5 MG/5ML PO ELIX
12.5000 mg | ORAL_SOLUTION | ORAL | Status: DC | PRN
Start: 2017-03-18 — End: 2017-03-20

## 2017-03-18 MED ORDER — TRANEXAMIC ACID 1000 MG/10ML IV SOLN
1000.0000 mg | INTRAVENOUS | Status: AC
  Administered 2017-03-18: 1000 mg via INTRAVENOUS
  Filled 2017-03-18: qty 1100

## 2017-03-18 MED ORDER — SCOPOLAMINE 1 MG/3DAYS TD PT72
1.0000 | MEDICATED_PATCH | TRANSDERMAL | Status: DC
  Administered 2017-03-18: 1.5 mg via TRANSDERMAL

## 2017-03-18 MED ORDER — ZOLPIDEM TARTRATE 5 MG PO TABS: 5.0000 mg | ORAL_TABLET | Freq: Every evening | ORAL | Status: DC | PRN

## 2017-03-18 MED ORDER — MENTHOL 3 MG MT LOZG: 1.0000 | LOZENGE | OROMUCOSAL | Status: DC | PRN

## 2017-03-18 MED ORDER — PHENOL 1.4 % MT LIQD: 1.0000 | OROMUCOSAL | Status: DC | PRN

## 2017-03-18 MED ORDER — PREDNISONE 2.5 MG PO TABS
2.5000 mg | ORAL_TABLET | Freq: Every day | ORAL | Status: DC
  Administered 2017-03-19 – 2017-03-20 (×2): 2.5 mg via ORAL
  Filled 2017-03-18 (×2): qty 1

## 2017-03-18 MED ORDER — POTASSIUM CHLORIDE IN NACL 20-0.9 MEQ/L-% IV SOLN
INTRAVENOUS | Status: DC
  Administered 2017-03-18 – 2017-03-19 (×2): via INTRAVENOUS
  Filled 2017-03-18 (×3): qty 1000

## 2017-03-18 MED ORDER — ALUM & MAG HYDROXIDE-SIMETH 200-200-20 MG/5ML PO SUSP: 30.0000 mL | ORAL | Status: DC | PRN

## 2017-03-18 MED ORDER — POVIDONE-IODINE 7.5 % EX SOLN: Freq: Once | CUTANEOUS | Status: DC

## 2017-03-18 MED ORDER — AMLODIPINE BESYLATE 5 MG PO TABS
5.0000 mg | ORAL_TABLET | Freq: Every day | ORAL | Status: DC
Start: 2017-03-19 — End: 2017-03-20
  Administered 2017-03-19 – 2017-03-20 (×2): 5 mg via ORAL
  Filled 2017-03-18 (×2): qty 1

## 2017-03-18 MED ORDER — ONDANSETRON HCL 4 MG PO TABS: 4.0000 mg | ORAL_TABLET | Freq: Four times a day (QID) | ORAL | Status: DC | PRN

## 2017-03-18 MED ORDER — ONDANSETRON HCL 4 MG/2ML IJ SOLN
INTRAMUSCULAR | Status: DC | PRN
  Administered 2017-03-18: 4 mg via INTRAVENOUS

## 2017-03-18 MED ORDER — FENTANYL CITRATE (PF) 100 MCG/2ML IJ SOLN
100.0000 ug | Freq: Once | INTRAMUSCULAR | Status: AC
  Administered 2017-03-18: 50 ug via INTRAVENOUS

## 2017-03-18 MED ORDER — FENTANYL CITRATE (PF) 100 MCG/2ML IJ SOLN
INTRAMUSCULAR | Status: DC | PRN
  Administered 2017-03-18 (×3): 50 ug via INTRAVENOUS

## 2017-03-18 MED ORDER — DEXTROSE 5 % IV SOLN
500.0000 mg | Freq: Four times a day (QID) | INTRAVENOUS | Status: DC | PRN
  Administered 2017-03-18: 500 mg via INTRAVENOUS
  Filled 2017-03-18: qty 5
  Filled 2017-03-18: qty 550

## 2017-03-18 MED ORDER — HYDROMORPHONE HCL 1 MG/ML IJ SOLN
0.2500 mg | INTRAMUSCULAR | Status: DC | PRN
  Administered 2017-03-18: 0.5 mg via INTRAVENOUS
  Administered 2017-03-18 (×2): 0.25 mg via INTRAVENOUS

## 2017-03-18 MED ORDER — KETOROLAC TROMETHAMINE 30 MG/ML IJ SOLN
INTRAMUSCULAR | Status: AC
  Filled 2017-03-18: qty 1

## 2017-03-18 MED ORDER — HYDROMORPHONE HCL 2 MG PO TABS
2.0000 mg | ORAL_TABLET | ORAL | Status: DC | PRN
  Administered 2017-03-18 – 2017-03-20 (×6): 2 mg via ORAL
  Filled 2017-03-18 (×6): qty 1

## 2017-03-18 MED ORDER — SODIUM CHLORIDE 0.9 % IJ SOLN
INTRAMUSCULAR | Status: AC
  Filled 2017-03-18: qty 50

## 2017-03-18 MED ORDER — PROPOFOL 10 MG/ML IV BOLUS
INTRAVENOUS | Status: DC | PRN
  Administered 2017-03-18: 160 mg via INTRAVENOUS

## 2017-03-18 MED ORDER — METFORMIN HCL 500 MG PO TABS
500.0000 mg | ORAL_TABLET | Freq: Every day | ORAL | Status: DC
  Administered 2017-03-19 – 2017-03-20 (×2): 500 mg via ORAL
  Filled 2017-03-18 (×2): qty 1

## 2017-03-18 MED ORDER — INSULIN ASPART 100 UNIT/ML ~~LOC~~ SOLN
0.0000 [IU] | Freq: Three times a day (TID) | SUBCUTANEOUS | Status: DC
  Administered 2017-03-19 (×2): 2 [IU] via SUBCUTANEOUS
  Administered 2017-03-19: 3 [IU] via SUBCUTANEOUS
  Administered 2017-03-20: 2 [IU] via SUBCUTANEOUS

## 2017-03-18 MED ORDER — SODIUM CHLORIDE 0.9 % IJ SOLN
INTRAMUSCULAR | Status: DC | PRN
  Administered 2017-03-18: 30 mL

## 2017-03-18 MED ORDER — FENTANYL CITRATE (PF) 100 MCG/2ML IJ SOLN
INTRAMUSCULAR | Status: AC
  Filled 2017-03-18: qty 2

## 2017-03-18 MED ORDER — FERROUS SULFATE 325 (65 FE) MG PO TABS
325.0000 mg | ORAL_TABLET | Freq: Three times a day (TID) | ORAL | Status: DC
  Administered 2017-03-19 – 2017-03-20 (×4): 325 mg via ORAL
  Filled 2017-03-18 (×4): qty 1

## 2017-03-18 MED ORDER — KETOROLAC TROMETHAMINE 30 MG/ML IJ SOLN
INTRAMUSCULAR | Status: DC | PRN
  Administered 2017-03-18: 30 mg via INTRAMUSCULAR

## 2017-03-18 MED ORDER — LIDOCAINE 2% (20 MG/ML) 5 ML SYRINGE
INTRAMUSCULAR | Status: DC | PRN
  Administered 2017-03-18: 100 mg via INTRAVENOUS

## 2017-03-18 MED ORDER — LACTATED RINGERS IV SOLN
INTRAVENOUS | Status: DC
  Administered 2017-03-18 (×2): via INTRAVENOUS

## 2017-03-18 MED ORDER — APIXABAN 2.5 MG PO TABS
2.5000 mg | ORAL_TABLET | Freq: Two times a day (BID) | ORAL | Status: DC
  Administered 2017-03-19 – 2017-03-20 (×3): 2.5 mg via ORAL
  Filled 2017-03-18 (×3): qty 1

## 2017-03-18 MED ORDER — LEVOTHYROXINE SODIUM 125 MCG PO TABS
125.0000 ug | ORAL_TABLET | Freq: Every day | ORAL | Status: DC
  Administered 2017-03-19 – 2017-03-20 (×2): 125 ug via ORAL
  Filled 2017-03-18 (×2): qty 1

## 2017-03-18 MED ORDER — 0.9 % SODIUM CHLORIDE (POUR BTL) OPTIME
TOPICAL | Status: DC | PRN
  Administered 2017-03-18: 1000 mL

## 2017-03-18 MED ORDER — ONDANSETRON HCL 4 MG/2ML IJ SOLN
INTRAMUSCULAR | Status: AC
  Filled 2017-03-18: qty 2

## 2017-03-18 MED ORDER — HYDROMORPHONE HCL 1 MG/ML IJ SOLN
1.0000 mg | INTRAMUSCULAR | Status: DC | PRN
  Administered 2017-03-18: 1 mg via INTRAVENOUS
  Filled 2017-03-18: qty 1

## 2017-03-18 MED ORDER — SCOPOLAMINE 1 MG/3DAYS TD PT72
MEDICATED_PATCH | TRANSDERMAL | Status: AC
Start: 2017-03-18 — End: 2017-03-19
  Filled 2017-03-18: qty 1

## 2017-03-18 MED ORDER — METFORMIN HCL 500 MG PO TABS: 500.0000 mg | ORAL_TABLET | Freq: Two times a day (BID) | ORAL | Status: DC

## 2017-03-18 MED ORDER — LOSARTAN POTASSIUM 50 MG PO TABS
100.0000 mg | ORAL_TABLET | Freq: Every day | ORAL | Status: DC
Start: 2017-03-18 — End: 2017-03-18

## 2017-03-18 MED ORDER — SUCCINYLCHOLINE CHLORIDE 200 MG/10ML IV SOSY
PREFILLED_SYRINGE | INTRAVENOUS | Status: AC
  Filled 2017-03-18: qty 10

## 2017-03-18 MED ORDER — BUPIVACAINE-EPINEPHRINE (PF) 0.5% -1:200000 IJ SOLN
INTRAMUSCULAR | Status: DC | PRN
Start: 2017-03-18 — End: 2017-03-18
  Administered 2017-03-18: 25 mL via PERINEURAL

## 2017-03-18 MED ORDER — CEFAZOLIN SODIUM-DEXTROSE 2-4 GM/100ML-% IV SOLN
2.0000 g | INTRAVENOUS | Status: AC
  Administered 2017-03-18: 2 g via INTRAVENOUS
  Filled 2017-03-18: qty 100

## 2017-03-18 MED ORDER — HYDROMORPHONE HCL 1 MG/ML IJ SOLN
INTRAMUSCULAR | Status: AC
  Administered 2017-03-18: 0.25 mg via INTRAVENOUS
  Filled 2017-03-18: qty 1

## 2017-03-18 MED ORDER — PROMETHAZINE HCL 25 MG/ML IJ SOLN: 6.2500 mg | INTRAMUSCULAR | Status: DC | PRN

## 2017-03-18 MED ORDER — SODIUM CHLORIDE 0.9 % IR SOLN
Status: DC | PRN
  Administered 2017-03-18: 1000 mL

## 2017-03-18 MED ORDER — APIXABAN 5 MG PO TABS: 5.0000 mg | ORAL_TABLET | Freq: Two times a day (BID) | ORAL | Status: DC

## 2017-03-18 MED ORDER — ROCURONIUM BROMIDE 50 MG/5ML IV SOSY
PREFILLED_SYRINGE | INTRAVENOUS | Status: AC
  Filled 2017-03-18: qty 5

## 2017-03-18 MED ORDER — SUCCINYLCHOLINE CHLORIDE 200 MG/10ML IV SOSY
PREFILLED_SYRINGE | INTRAVENOUS | Status: DC | PRN
  Administered 2017-03-18: 120 mg via INTRAVENOUS

## 2017-03-18 MED ORDER — METOCLOPRAMIDE HCL 5 MG PO TABS: 5.0000 mg | ORAL_TABLET | Freq: Three times a day (TID) | ORAL | Status: DC | PRN

## 2017-03-18 MED ORDER — DOCUSATE SODIUM 100 MG PO CAPS
100.0000 mg | ORAL_CAPSULE | Freq: Two times a day (BID) | ORAL | Status: DC
  Administered 2017-03-19 – 2017-03-20 (×3): 100 mg via ORAL
  Filled 2017-03-18 (×3): qty 1

## 2017-03-18 MED ORDER — ENOXAPARIN SODIUM 30 MG/0.3ML ~~LOC~~ SOLN: 30.0000 mg | Freq: Two times a day (BID) | SUBCUTANEOUS | Status: DC

## 2017-03-18 MED ORDER — MIDAZOLAM HCL 2 MG/2ML IJ SOLN
2.0000 mg | Freq: Once | INTRAMUSCULAR | Status: AC
  Administered 2017-03-18: 0.5 mg via INTRAVENOUS

## 2017-03-18 MED ORDER — CEFAZOLIN SODIUM-DEXTROSE 2-4 GM/100ML-% IV SOLN
2.0000 g | Freq: Four times a day (QID) | INTRAVENOUS | Status: AC
  Administered 2017-03-18 – 2017-03-19 (×2): 2 g via INTRAVENOUS

## 2017-03-18 MED ORDER — LOSARTAN POTASSIUM 50 MG PO TABS
100.0000 mg | ORAL_TABLET | Freq: Every day | ORAL | Status: DC
  Administered 2017-03-19 – 2017-03-20 (×2): 100 mg via ORAL
  Filled 2017-03-18 (×2): qty 2

## 2017-03-18 MED ORDER — METOCLOPRAMIDE HCL 5 MG/ML IJ SOLN
5.0000 mg | Freq: Three times a day (TID) | INTRAMUSCULAR | Status: DC | PRN
  Administered 2017-03-18: 19:00:00 10 mg via INTRAVENOUS
  Filled 2017-03-18: qty 2

## 2017-03-18 MED ORDER — METHOCARBAMOL 500 MG PO TABS: 500.0000 mg | ORAL_TABLET | Freq: Four times a day (QID) | ORAL | Status: DC | PRN

## 2017-03-18 MED ORDER — BUPIVACAINE-EPINEPHRINE 0.25% -1:200000 IJ SOLN
INTRAMUSCULAR | Status: DC | PRN
  Administered 2017-03-18: 30 mL

## 2017-03-18 MED ORDER — METFORMIN HCL 500 MG PO TABS
1000.0000 mg | ORAL_TABLET | Freq: Every day | ORAL | Status: DC
  Administered 2017-03-19: 17:00:00 1000 mg via ORAL
  Filled 2017-03-18: qty 2

## 2017-03-18 MED ORDER — LIDOCAINE 2% (20 MG/ML) 5 ML SYRINGE
INTRAMUSCULAR | Status: AC
  Filled 2017-03-18: qty 5

## 2017-03-18 SURGICAL SUPPLY — 60 items
ADH SKN CLS APL DERMABOND .7 (GAUZE/BANDAGES/DRESSINGS) ×1
BAG DECANTER FOR FLEXI CONT (MISCELLANEOUS) IMPLANT
BAG SPEC THK2 15X12 ZIP CLS (MISCELLANEOUS) ×2
BAG ZIPLOCK 12X15 (MISCELLANEOUS) ×6 IMPLANT
BANDAGE ACE 4X5 VEL STRL LF (GAUZE/BANDAGES/DRESSINGS) ×3 IMPLANT
BANDAGE ACE 6X5 VEL STRL LF (GAUZE/BANDAGES/DRESSINGS) ×3 IMPLANT
BLADE SAG 18X100X1.27 (BLADE) ×3 IMPLANT
BLADE SAW SGTL 13.0X1.19X90.0M (BLADE) ×3 IMPLANT
BONE CEMENT GENTAMICIN (Cement) ×6 IMPLANT
BOWL SMART MIX CTS (DISPOSABLE) ×3 IMPLANT
CAP KNEE TOTAL 3 SIGMA ×2 IMPLANT
CEMENT BONE GENTAMICIN 40 (Cement) IMPLANT
CUFF TOURN SGL QUICK 34 (TOURNIQUET CUFF) ×3
CUFF TRNQT CYL 34X4X40X1 (TOURNIQUET CUFF) ×1 IMPLANT
DECANTER SPIKE VIAL GLASS SM (MISCELLANEOUS) ×3 IMPLANT
DERMABOND ADVANCED (GAUZE/BANDAGES/DRESSINGS) ×2
DERMABOND ADVANCED .7 DNX12 (GAUZE/BANDAGES/DRESSINGS) ×1 IMPLANT
DRAPE U-SHAPE 47X51 STRL (DRAPES) ×3 IMPLANT
DRSG AQUACEL AG ADV 3.5X10 (GAUZE/BANDAGES/DRESSINGS) ×3 IMPLANT
DRSG TEGADERM 4X4.75 (GAUZE/BANDAGES/DRESSINGS) ×3 IMPLANT
DURAPREP 26ML APPLICATOR (WOUND CARE) ×6 IMPLANT
ELECT REM PT RETURN 15FT ADLT (MISCELLANEOUS) ×3 IMPLANT
EVACUATOR 1/8 PVC DRAIN (DRAIN) ×3 IMPLANT
GAUZE SPONGE 2X2 8PLY STRL LF (GAUZE/BANDAGES/DRESSINGS) ×1 IMPLANT
GLOVE BIOGEL PI IND STRL 8 (GLOVE) ×2 IMPLANT
GLOVE BIOGEL PI INDICATOR 8 (GLOVE) ×4
GLOVE ECLIPSE 8.0 STRL XLNG CF (GLOVE) ×6 IMPLANT
GLOVE SURG ORTHO 9.0 STRL STRW (GLOVE) ×3 IMPLANT
GLOVE SURG SS PI 7.5 STRL IVOR (GLOVE) ×3 IMPLANT
GOWN STRL REUS W/TWL XL LVL3 (GOWN DISPOSABLE) ×6 IMPLANT
HANDPIECE INTERPULSE COAX TIP (DISPOSABLE) ×3
IMMOBILIZER KNEE 20 (SOFTGOODS) ×3
IMMOBILIZER KNEE 20 THIGH 36 (SOFTGOODS) ×1 IMPLANT
NS IRRIG 1000ML POUR BTL (IV SOLUTION) ×3 IMPLANT
PACK TOTAL KNEE CUSTOM (KITS) ×3 IMPLANT
POSITIONER SURGICAL ARM (MISCELLANEOUS) ×3 IMPLANT
SET HNDPC FAN SPRY TIP SCT (DISPOSABLE) ×1 IMPLANT
SET PAD KNEE POSITIONER (MISCELLANEOUS) ×3 IMPLANT
SPONGE GAUZE 2X2 STER 10/PKG (GAUZE/BANDAGES/DRESSINGS) ×2
SPONGE LAP 18X18 X RAY DECT (DISPOSABLE) IMPLANT
SPONGE SURGIFOAM ABS GEL 100 (HEMOSTASIS) ×3 IMPLANT
STOCKINETTE 6  STRL (DRAPES) ×2
STOCKINETTE 6 STRL (DRAPES) ×1 IMPLANT
SUCTION FRAZIER HANDLE 12FR (TUBING) ×2
SUCTION TUBE FRAZIER 12FR DISP (TUBING) ×1 IMPLANT
SUT BONE WAX W31G (SUTURE) IMPLANT
SUT MNCRL AB 3-0 PS2 18 (SUTURE) ×3 IMPLANT
SUT VIC AB 1 CT1 27 (SUTURE) ×12
SUT VIC AB 1 CT1 27XBRD ANTBC (SUTURE) ×4 IMPLANT
SUT VIC AB 2-0 CT1 27 (SUTURE) ×9
SUT VIC AB 2-0 CT1 TAPERPNT 27 (SUTURE) ×2 IMPLANT
SUT VLOC 180 0 24IN GS25 (SUTURE) ×3 IMPLANT
SYR 50ML LL SCALE MARK (SYRINGE) ×3 IMPLANT
TAPE STRIPS DRAPE STRL (GAUZE/BANDAGES/DRESSINGS) ×3 IMPLANT
TRAY FOLEY CATH 14FRSI W/METER (CATHETERS) ×2 IMPLANT
TRAY FOLEY W/METER SILVER 16FR (SET/KITS/TRAYS/PACK) ×1 IMPLANT
WATER STERILE IRR 1000ML POUR (IV SOLUTION) ×4 IMPLANT
WATER STERILE IRR 1500ML POUR (IV SOLUTION) ×6 IMPLANT
WRAP KNEE MAXI GEL POST OP (GAUZE/BANDAGES/DRESSINGS) ×3 IMPLANT
YANKAUER SUCT BULB TIP 10FT TU (MISCELLANEOUS) ×3 IMPLANT

## 2017-03-18 NOTE — Progress Notes (Signed)
AssistedDr. Massagee with right, ultrasound guided, adductor canal block. Side rails up, monitors on throughout procedure. See vital signs in flow sheet. Tolerated Procedure well.  

## 2017-03-18 NOTE — Op Note (Signed)
DATE OF SURGERY:  03/18/2017  TIME: 3:17 PM  PATIENT NAME:  Destiny Brown    AGE: 71 y.o.   PRE-OPERATIVE DIAGNOSIS:  Right knee osteoarthritis  POST-OPERATIVE DIAGNOSIS:  Right knee osteoarthritis  PROCEDURE:  Procedure(s): RIGHT TOTAL KNEE ARTHROPLASTY  SURGEON:  Adysen Raphael ANDREW  ASSISTANT:  Bryson Stilwell, PA-C, present and scrubbed throughout the case, critical for assistance with exposure, retraction, instrumentation, and closure.  OPERATIVE IMPLANTS: Depuy PFC Sigma Rotating Platform.  Femur size 4, Tibia size 3, Patella size 35 3-peg oval button, with a 10 mm polyethylene insert.   PREOPERATIVE INDICATIONS:   Destiny SievingSharon A Camper is a 71 y.o. year old female with end stage bone on bone arthritis of the knee who failed conservative treatment and elected for Total Knee Arthroplasty.   The risks, benefits, and alternatives were discussed at length including but not limited to the risks of infection, bleeding, nerve injury, stiffness, blood clots, the need for revision surgery, cardiopulmonary complications, among others, and they were willing to proceed.  OPERATIVE DESCRIPTION:  The patient was brought to the operative room and placed in a supine position.  Spinal anesthesia was administered.  IV antibiotics were given.  The lower extremity was prepped and draped in the usual sterile fashion.  Time out was performed.  The leg was elevated and exsanguinated and the tourniquet was inflated.  Anterior quadriceps tendon splitting approach was performed.  The patella was retracted and osteophytes were removed.  The anterior horn of the medial and lateral meniscus was removed and cruciate ligaments resected.   The distal femur was opened with the drill and the intramedullary distal femoral cutting jig was utilized, set at 5 degrees resecting 10 mm off the distal femur.  Care was taken to protect the collateral ligaments.  The distal femoral sizing jig was applied, taking care  to avoid notching.  Then the 4-in-1 cutting jig was applied and the anterior and posterior femur was cut, along with the chamfer cuts.    Then the extramedullary tibial cutting jig was utilized making the appropriate cut using the anterior tibial crest as a reference building in appropriate posterior slope.  Care was taken during the cut to protect the medial and collateral ligaments.  The proximal tibia was removed along with the posterior horns of the menisci.   The posterior medial femoral osteophytes and posterior lateral femoral osteophytes were removed.    The flexion gap was then measured and was symmetric with the extension gap, measured at 10.  I completed the distal femoral preparation using the appropriate jig to prepare the box.  The patella was then measured, and cut with the saw.    The proximal tibia sized and prepared accordingly with the reamer and the punch, and then all components were trialed with the trial insert.  The knee was found to have excellent balance and full motion.    The above named components were then cemented into place and all excess cement was removed.  The trial polyethylene component was in place during cementation, and then was exchanged for the real polyethylene component.    The knee was easily taken through a range of motion and the patella tracked well and the knee irrigated copiously and the parapatellar and subcutaneous tissue closed with vicryl, and monocryl with steri strips for the skin.  The arthrotomy was closed at 90 of flexion. The wounds were dressed with sterile gauze and the tourniquet released and the patient was awakened and returned to the PACU  in stable and satisfactory condition.  There were no complications.  Total tourniquet time was 80 minutes.

## 2017-03-18 NOTE — Anesthesia Procedure Notes (Addendum)
Anesthesia Regional Block: Adductor canal block   Pre-Anesthetic Checklist: ,, timeout performed, Correct Patient, Correct Site, Correct Laterality, Correct Procedure, Correct Position, site marked, surgical consent, pre-op evaluation,  At surgeon's request and post-op pain management  Laterality: Right and Lower  Prep: chloraprep       Needles:   Needle Type: Echogenic Stimulator Needle     Needle Length: 9cm  Needle Gauge: 21   Needle insertion depth: 6 cm   Additional Needles:   Procedures: ultrasound guided,,,,,,,,  Narrative:  Start time: 03/18/2017 12:55 PM End time: 03/18/2017 1:10 PM Injection made incrementally with aspirations every 5 mL.  Performed by: Personally  Anesthesiologist: Wane Mollett

## 2017-03-18 NOTE — Interval H&P Note (Signed)
History and Physical Interval Note:  03/18/2017 1:32 PM  [name]  has presented today for surgery, with the diagnosis of Right knee osteoarthritis  The various methods of treatment have been discussed with the patient and family. After consideration of risks, benefits and other options for treatment, the patient has consented to  Procedure(s): RIGHT TOTAL KNEE ARTHROPLASTY (Right) as a surgical intervention .  The patient's history has been reviewed, patient examined, no change in status, stable for surgery.  I have reviewed the patient's chart and labs.  Questions were answered to the patient's satisfaction.     Destiny Brown ANDREW

## 2017-03-18 NOTE — Anesthesia Procedure Notes (Signed)
Procedure Name: Intubation Date/Time: 03/18/2017 1:45 PM Performed by: Lind Covert Pre-anesthesia Checklist: Patient identified, Emergency Drugs available, Suction available, Patient being monitored and Timeout performed Patient Re-evaluated:Patient Re-evaluated prior to inductionOxygen Delivery Method: Circle system utilized Preoxygenation: Pre-oxygenation with 100% oxygen Intubation Type: IV induction Laryngoscope Size: Mac and 3 Grade View: Grade I Tube type: Oral Tube size: 7.0 mm Number of attempts: 1 Airway Equipment and Method: Stylet Placement Confirmation: ETT inserted through vocal cords under direct vision,  positive ETCO2 and breath sounds checked- equal and bilateral Secured at: 22 cm Tube secured with: Tape Dental Injury: Teeth and Oropharynx as per pre-operative assessment

## 2017-03-18 NOTE — Anesthesia Preprocedure Evaluation (Addendum)
Anesthesia Evaluation  Patient identified by MRN, date of birth, ID band Patient awake    Reviewed: Allergy & Precautions, NPO status , Patient's Chart, lab work & pertinent test results  History of Anesthesia Complications (+) PONV  Airway Mallampati: II  TM Distance: >3 FB Neck ROM: Full    Dental no notable dental hx. (+) Teeth Intact, Dental Advisory Given   Pulmonary sleep apnea ,    Pulmonary exam normal breath sounds clear to auscultation       Cardiovascular hypertension, Normal cardiovascular exam+ dysrhythmias  Rhythm:Regular Rate:Normal     Neuro/Psych negative neurological ROS  negative psych ROS   GI/Hepatic negative GI ROS, Neg liver ROS,   Endo/Other  diabetesHypothyroidism   Renal/GU negative Renal ROS  negative genitourinary   Musculoskeletal negative musculoskeletal ROS (+)   Abdominal   Peds negative pediatric ROS (+)  Hematology negative hematology ROS (+)   Anesthesia Other Findings   Reproductive/Obstetrics negative OB ROS                           Anesthesia Physical Anesthesia Plan  ASA: III  Anesthesia Plan: General   Post-op Pain Management:    Induction: Intravenous  Airway Management Planned: Oral ETT  Additional Equipment:   Intra-op Plan:   Post-operative Plan: Extubation in OR  Informed Consent: I have reviewed the patients History and Physical, chart, labs and discussed the procedure including the risks, benefits and alternatives for the proposed anesthesia with the patient or authorized representative who has indicated his/her understanding and acceptance.   Dental advisory given  Plan Discussed with: CRNA and Surgeon  Anesthesia Plan Comments: (GA backup,  s/p back surgery)       Anesthesia Quick Evaluation

## 2017-03-18 NOTE — Interval H&P Note (Signed)
History and Physical Interval Note:  03/18/2017 1:31 PM  Destiny SievingSharon A Portugal  has presented today for surgery, with the diagnosis of Right knee osteoarthritis  The various methods of treatment have been discussed with the patient and family. After consideration of risks, benefits and other options for treatment, the patient has consented to  Procedure(s): RIGHT TOTAL KNEE ARTHROPLASTY (Right) as a surgical intervention .  The patient's history has been reviewed, patient examined, no change in status, stable for surgery.  I have reviewed the patient's chart and labs.  Questions were answered to the patient's satisfaction.     Destiny Brown ANDREW

## 2017-03-18 NOTE — Transfer of Care (Signed)
Immediate Anesthesia Transfer of Care Note  Patient: Destiny Brown  Procedure(s) Performed: Procedure(s): RIGHT TOTAL KNEE ARTHROPLASTY (Right)  Patient Location: PACU  Anesthesia Type:General  Level of Consciousness: sedated  Airway & Oxygen Therapy: Patient Spontanous Breathing and Patient connected to face mask oxygen  Post-op Assessment: Report given to RN and Post -op Vital signs reviewed and stable  Post vital signs: Reviewed and stable  Last Vitals:  Vitals:   03/18/17 1320 03/18/17 1325  BP: 139/68 137/65  Pulse: 72 71  Resp: 16 16  Temp:      Last Pain:  Vitals:   03/18/17 1024  TempSrc:   PainSc: 5       Patients Stated Pain Goal: 4 (03/18/17 1024)  Complications: No apparent anesthesia complications

## 2017-03-19 ENCOUNTER — Encounter (HOSPITAL_COMMUNITY): Payer: Self-pay | Admitting: Specialist

## 2017-03-19 LAB — CBC
HCT: 33.5 % — ABNORMAL LOW (ref 36.0–46.0)
Hemoglobin: 11 g/dL — ABNORMAL LOW (ref 12.0–15.0)
MCH: 29.5 pg (ref 26.0–34.0)
MCHC: 32.8 g/dL (ref 30.0–36.0)
MCV: 89.8 fL (ref 78.0–100.0)
PLATELETS: 207 10*3/uL (ref 150–400)
RBC: 3.73 MIL/uL — ABNORMAL LOW (ref 3.87–5.11)
RDW: 13.5 % (ref 11.5–15.5)
WBC: 9.7 10*3/uL (ref 4.0–10.5)

## 2017-03-19 LAB — BASIC METABOLIC PANEL
Anion gap: 9 (ref 5–15)
BUN: 18 mg/dL (ref 6–20)
CO2: 26 mmol/L (ref 22–32)
CREATININE: 1.43 mg/dL — AB (ref 0.44–1.00)
Calcium: 9.1 mg/dL (ref 8.9–10.3)
Chloride: 104 mmol/L (ref 101–111)
GFR, EST AFRICAN AMERICAN: 41 mL/min — AB (ref >60)
GFR, EST NON AFRICAN AMERICAN: 36 mL/min — AB (ref >60)
Glucose, Bld: 139 mg/dL — ABNORMAL HIGH (ref 65–99)
Potassium: 4.6 mmol/L (ref 3.5–5.1)
SODIUM: 139 mmol/L (ref 135–145)

## 2017-03-19 LAB — GLUCOSE, CAPILLARY
GLUCOSE-CAPILLARY: 127 mg/dL — AB (ref 65–99)
GLUCOSE-CAPILLARY: 139 mg/dL — AB (ref 65–99)
Glucose-Capillary: 163 mg/dL — ABNORMAL HIGH (ref 65–99)
Glucose-Capillary: 133 mg/dL — ABNORMAL HIGH (ref 65–99)

## 2017-03-19 MED ORDER — HYDROMORPHONE HCL 2 MG PO TABS: 2.0000 mg | ORAL_TABLET | ORAL | 0 refills | Status: DC | PRN

## 2017-03-19 MED ORDER — BACLOFEN 10 MG PO TABS: 10.0000 mg | ORAL_TABLET | Freq: Three times a day (TID) | ORAL | 1 refills | Status: DC

## 2017-03-19 NOTE — Evaluation (Signed)
Occupational Therapy Evaluation Patient Details Name: Destiny SievingSharon A Brown MRN: [id number] DOB: [birth year] Today's Date: 03/19/2017    History of Present Illness s/p R TKA   Clinical Impression   This 71 year old female was admitted for the above sx. All education was completed. No further OT is needed at this time    Follow Up Recommendations  Supervision/Assistance - 24 hour    Equipment Recommendations  3 in 1 bedside commode (if she cannot borrow one)    Recommendations for Other Services       Precautions / Restrictions Precautions Precautions: Knee;Fall Restrictions Weight Bearing Restrictions: No      Mobility Bed Mobility               General bed mobility comments: oob  Transfers Overall transfer level: Needs assistance Equipment used: Rolling walker (2 wheeled) Transfers: Sit to/from Stand Sit to Stand: Min guard         General transfer comment: for safety. Cues for UE/LE placement and for sequence when walking to bathroom    Balance                                           ADL either performed or assessed with clinical judgement   ADL Overall ADL's : Needs assistance/impaired     Grooming: Oral care;Supervision/safety;Standing                   Toilet Transfer: Min guard;Ambulation;BSC;RW             General ADL Comments: pt can perform UB adls with set up. She needs mod A for LB bathing and max A for LB dressing.  Husband will assist as needed:  not interested in AE.  Pt states she will sponge bathe initially.  Educated on knee precautions and sidestepping through tight spaces to keep walker in front of her     Vision         Perception     Praxis      Pertinent Vitals/Pain Pain Assessment: Faces Faces Pain Scale: Hurts a little bit Pain Location: R knee Pain Descriptors / Indicators: Sore Pain Intervention(s): Limited activity within patient's tolerance;Monitored during session;Premedicated before  session;Repositioned;Ice applied     Hand Dominance     Extremity/Trunk Assessment Upper Extremity Assessment Upper Extremity Assessment: Overall WFL for tasks assessed           Communication Communication Communication: No difficulties   Cognition Arousal/Alertness: Awake/alert Behavior During Therapy: WFL for tasks assessed/performed Overall Cognitive Status: Within Functional Limits for tasks assessed                                     General Comments       Exercises     Shoulder Instructions      Home Living Family/patient expects to be discharged to:: Private residence Living Arrangements: Spouse/significant other Available Help at Discharge: Family               Bathroom Shower/Tub: Chief Strategy OfficerTub/shower unit   Bathroom Toilet: Standard     Home Equipment: Environmental consultantWalker - 2 wheels;Grab bars - tub/shower   Additional Comments: husband will see if they can borrow a 3:1 from the church      Prior Functioning/Environment Level of Independence: Independent  OT Problem List:        OT Treatment/Interventions:      OT Goals(Current goals can be found in the care plan section) Acute Rehab OT Goals Patient Stated Goal: home OT Goal Formulation: All assessment and education complete, DC therapy  OT Frequency:     Barriers to D/C:            Co-evaluation              End of Session    Activity Tolerance: Patient tolerated treatment well Patient left: in chair;with call bell/phone within reach;with family/visitor present  OT Visit Diagnosis: Pain Pain - Right/Left: Right Pain - part of body: Knee                Time: 8295-6213 OT Time Calculation (min): 27 min Charges:  OT General Charges $OT Visit: 1 Procedure OT Evaluation $OT Eval Low Complexity: 1 Procedure G-Codes:     Destiny Brown, OTR/L [phone] 03/19/2017  Destiny Brown 03/19/2017, 12:24 PM

## 2017-03-19 NOTE — Progress Notes (Signed)
Subjective: 1 Day Post-Op Procedure(s) (LRB): RIGHT TOTAL KNEE ARTHROPLASTY (Right) Patient reports pain as well controlled.  Reports a good night. Tolerating PO's. Denies SOB,CP, or calf pain.  Objective: Vital signs in last 24 hours: Temp:  [97.6 F (36.4 C)-98.3 F (36.8 C)] 97.7 F (36.5 C) (03/23 0557) Pulse Rate:  [61-89] 79 (03/23 0557) Resp:  [11-17] 14 (03/23 0557) BP: (136-176)/(52-114) 155/58 (03/23 0557) SpO2:  [96 %-100 %] 99 % (03/23 0557) Weight:  [92.1 kg (203 lb)] 92.1 kg (203 lb) (03/22 1024)  Intake/Output from previous day: 03/22 0701 - 03/23 0700 In: 2830 [P.O.:420; I.V.:2260; IV Piggyback:150] Out: 965 [Urine:925; Drains:40] Intake/Output this shift: No intake/output data recorded.   Recent Labs  03/19/17 0427  HGB 11.0*    Recent Labs  03/19/17 0427  WBC 9.7  RBC 3.73*  HCT 33.5*  PLT 207    Recent Labs  03/19/17 0427  NA 139  K 4.6  CL 104  CO2 26  BUN 18  CREATININE 1.43*  GLUCOSE 139*  CALCIUM 9.1   No results for input(s): LABPT, INR in the last 72 hours.  Well nourished. Alert and oriented x3. RRR, Lungs clear, BS x4. Abdomen soft and non tender. Right Calf soft and non tender. Right knee dressing C/D/I. No DVT signs. Compartment soft. No signs of infection.  Right LE grossly neurovascular intact.  Assessment/Plan: 1 Day Post-Op Procedure(s) (LRB): RIGHT TOTAL KNEE ARTHROPLASTY (Right) Up with PT Plan D/c home Tomorrow Restart Eliquis Monitor labs Recheck BMP for elevated Crea tomorrow, F/u with PCP Drain D/c'ed with tip intact    STILWELL, BRYSON L 03/19/2017, 7:52 AM

## 2017-03-19 NOTE — Progress Notes (Signed)
qPhysical Therapy Treatment Patient Details Name: Destiny Brown MRN: [id number] DOB: Aug 21, 1946 Today's Date: 03/19/2017    History of Present Illness s/p R TKA    PT Comments    Pt motivated and progressing with mobility but with increased time required for all tasks.   Follow Up Recommendations  Home health PT     Equipment Recommendations  None recommended by PT    Recommendations for Other Services OT consult     Precautions / Restrictions Precautions Precautions: Knee;Fall Required Braces or Orthoses: Knee Immobilizer - Right Knee Immobilizer - Right: Discontinue once straight leg raise with < 10 degree lag Restrictions Weight Bearing Restrictions: No Other Position/Activity Restrictions: WBAT    Mobility  Bed Mobility Overal bed mobility: Needs Assistance Bed Mobility: Sit to Supine     Supine to sit: Min assist;Mod assist Sit to supine: Min assist   General bed mobility comments: cues for sequence and use of L LE to self assist  Transfers Overall transfer level: Needs assistance Equipment used: Rolling walker (2 wheeled) Transfers: Sit to/from Stand Sit to Stand: Min assist         General transfer comment: cues for LE management and use of UEs to self assist  Ambulation/Gait Ambulation/Gait assistance: Min assist Ambulation Distance (Feet): 74 Feet Assistive device: Rolling walker (2 wheeled) Gait Pattern/deviations: Step-to pattern;Decreased step length - right;Decreased step length - left;Shuffle;Trunk flexed Gait velocity: decr Gait velocity interpretation: Below normal speed for age/gender General Gait Details: Increased time with cues for sequence, posture and position from [employer]            Wheelchair Mobility    Modified Rankin (Stroke Patients Only)       Balance                                            Cognition Arousal/Alertness: Awake/alert Behavior During Therapy: WFL for tasks  assessed/performed Overall Cognitive Status: Within Functional Limits for tasks assessed                                        Exercises Total Joint Exercises Ankle Circles/Pumps: AROM;Both;15 reps;Supine Quad Sets: AROM;Both;10 reps;Supine Heel Slides: AAROM;Right;15 reps;Supine Straight Leg Raises: AAROM;Right;10 reps;Supine    General Comments        Pertinent Vitals/Pain Pain Assessment: 0-10 Pain Score: 5  Faces Pain Scale: Hurts a little bit Pain Location: R knee Pain Descriptors / Indicators: Sore Pain Intervention(s): Limited activity within patient's tolerance;Monitored during session;Premedicated before session;Ice applied    Home Living Family/patient expects to be discharged to:: Private residence Living Arrangements: Spouse/significant other Available Help at Discharge: Family Type of Home: House Home Access: Stairs to enter Entrance Stairs-Rails: Right;Left;Can reach both Home Layout: One level Home Equipment: Environmental consultant - 2 wheels;Grab bars - tub/shower Additional Comments: husband is checking on condition of RW    Prior Function Level of Independence: Independent with assistive device(s);Independent          PT Goals (current goals can now be found in the care plan section) Acute Rehab PT Goals Patient Stated Goal: home PT Goal Formulation: With patient Time For Goal Achievement: 03/23/17 Potential to Achieve Goals: Good Progress towards PT goals: Progressing toward goals    Frequency    7X/week  PT Plan Current plan remains appropriate    Co-evaluation             End of Session Equipment Utilized During Treatment: Gait belt;Right knee immobilizer Activity Tolerance: Patient tolerated treatment well Patient left: in bed;with call bell/phone within reach Nurse Communication: Mobility status PT Visit Diagnosis: Unsteadiness on feet (R26.81)     Time: 1345-1415 PT Time Calculation (min) (ACUTE ONLY): 30  min  Charges:  $Gait Training: 23-37 mins $Therapeutic Exercise: 8-22 mins                    G Codes:        [phone]  Leonardo Makris 03/19/2017, 2:22 PM

## 2017-03-19 NOTE — Care Management Note (Signed)
Case Management Note  Patient Details  Name: Destiny SievingSharon A Brown MRN: [id number] Date of Birth: [birth year]  Subjective/Objective: 71 y/o f admitted w/OA r knee. POD#1 R TKA. From home w/spouse. Has cane. [facility] Jorja Loaim already following. PT cons-await recc.                 Action/Plan:d/c plan home w/HHC.   Expected Discharge Date:               Expected Discharge Plan:  Home w Home Health Services  In-House Referral:     Discharge planning Services  CM Consult  Post Acute Care Choice:  Durable Medical Equipment (Has cane) Choice offered to:     DME Arranged:    DME Agency:     HH Arranged:    HH Agency:     Status of Service:  In process, will continue to follow  If discussed at Long Length of Stay Meetings, dates discussed:    Additional Comments:  Lanier ClamMahabir, Zamara Cozad, RN 03/19/2017, 3:27 PM

## 2017-03-19 NOTE — Discharge Summary (Signed)
Physician Discharge Summary  Patient ID: Destiny Brown MRN: [id number] DOB/AGE: 1946-04-05 71 y.o.  Admit date: 03/18/2017 Discharge date: 03/20/17  Admission Diagnoses: Right knee OA  Discharge Diagnoses:  Active Problems:   S/P knee replacement   Discharged Condition: good  Hospital Course:  Destiny Brown is a 71 y.o. who was admitted to [facility]. They were brought to the operating room on 03/18/2017 and underwent Procedure(s): RIGHT TOTAL KNEE ARTHROPLASTY.  Patient tolerated the procedure well and was later transferred to the recovery room and then to the orthopaedic floor for postoperative care.  They were given PO and IV analgesics for pain control following their surgery.  They were given 24 hours of postoperative antibiotics of  Anti-infectives    Start     Dose/Rate Route Frequency Ordered Stop   03/18/17 2000  ceFAZolin (ANCEF) IVPB 2g/100 mL premix     2 g 200 mL/hr over 30 Minutes Intravenous Every 6 hours 03/18/17 1721 03/19/17 0225   03/18/17 1010  ceFAZolin (ANCEF) IVPB 2g/100 mL premix     2 g 200 mL/hr over 30 Minutes Intravenous On call to O.R. 03/18/17 1010 03/18/17 1350     and started on DVT prophylaxis in the form of Lovenox and eliquis.   PT and OT were ordered for total joint protocol.  Discharge planning consulted to help with postop disposition and equipment needs.  Patient had a good night on the evening of surgery and started to get up OOB with therapy on day one.  Hemovac drain was pulled without difficulty.  Continued to work with therapy into day two.  Dressing was with normal limits.  The patient had progressed with therapy and meeting their goals. Patient was seen in rounds and was ready to go home.  Consults: n/a  Significant Diagnostic Studies: routine  Treatments: routine  Discharge Exam: Blood pressure (!) 155/58, pulse 79, temperature 97.7 F (36.5 C), temperature source Oral, resp. rate 14, height 5\' 5"  (1.651 m), weight 92.1  kg (203 lb), SpO2 99 %. Well nourished. Alert and oriented x3. RRR, Lungs clear, BS x4. Abdomen soft and non tender. Right Calf soft and non tender. Right knee dressing C/D/I. No DVT signs. Compartment soft. No signs of infection.  Right LE grossly neurovascular intact.  Disposition: 01-Home or Self Care  Discharge Instructions    Call MD / Call 911    Complete by:  As directed    If you experience chest pain or shortness of breath, CALL 911 and be transported to the hospital emergency room.  If you develope a fever above 101 F, pus (white drainage) or increased drainage or redness at the wound, or calf pain, call your surgeon's office.   Constipation Prevention    Complete by:  As directed    Drink plenty of fluids.  Prune juice may be helpful.  You may use a stool softener, such as Colace (over the counter) 100 mg twice a day.  Use MiraLax (over the counter) for constipation as needed.   Diet - low sodium heart healthy    Complete by:  As directed    Discharge instructions    Complete by:  As directed    INSTRUCTIONS AFTER JOINT REPLACEMENT   Remove items at home which could result in a fall. This includes throw rugs or furniture in walking pathways ICE to the affected joint every three hours while awake for 30 minutes at a time, for at least the first 3-5 days, and  then as needed for pain and swelling.  Continue to use ice for pain and swelling. You may notice swelling that will progress down to the foot and ankle.  This is normal after surgery.  Elevate your leg when you are not up walking on it.   Continue to use the breathing machine you got in the hospital (incentive spirometer) which will help keep your temperature down.  It is common for your temperature to cycle up and down following surgery, especially at night when you are not up moving around and exerting yourself.  The breathing machine keeps your lungs expanded and your temperature down.   DIET:  As you were doing prior to  hospitalization, we recommend a well-balanced diet.  DRESSING / WOUND CARE / SHOWERING  Keep the surgical dressing until follow up.  The dressing is water proof, so you can shower without any extra covering.  IF THE DRESSING FALLS OFF or the wound gets wet inside, change the dressing with sterile gauze.  Please use good hand washing techniques before changing the dressing.  Do not use any lotions or creams on the incision until instructed by your surgeon.    ACTIVITY  Increase activity slowly as tolerated, but follow the weight bearing instructions below.   No driving for 6 weeks or until further direction given by your physician.  You cannot drive while taking narcotics.  No lifting or carrying greater than 10 lbs. until further directed by your surgeon. Avoid periods of inactivity such as sitting longer than an hour when not asleep. This helps prevent blood clots.  You may return to work once you are authorized by your doctor.     WEIGHT BEARING   Weight bearing as tolerated with assist device (walker, cane, etc) as directed, use it as long as suggested by your surgeon or therapist, typically at least 4-6 weeks.   EXERCISES  Results after joint replacement surgery are often greatly improved when you follow the exercise, range of motion and muscle strengthening exercises prescribed by your doctor. Safety measures are also important to protect the joint from further injury. Any time any of these exercises cause you to have increased pain or swelling, decrease what you are doing until you are comfortable again and then slowly increase them. If you have problems or questions, call your caregiver or physical therapist for advice.   Rehabilitation is important following a joint replacement. After just a few days of immobilization, the muscles of the leg can become weakened and shrink (atrophy).  These exercises are designed to build up the tone and strength of the thigh and leg muscles and to  improve motion. Often times heat used for twenty to thirty minutes before working out will loosen up your tissues and help with improving the range of motion but do not use heat for the first two weeks following surgery (sometimes heat can increase post-operative swelling).   These exercises can be done on a training (exercise) mat, on the floor, on a table or on a bed. Use whatever works the best and is most comfortable for you.    Use music or television while you are exercising so that the exercises are a pleasant break in your day. This will make your life better with the exercises acting as a break in your routine that you can look forward to.   Perform all exercises about fifteen times, three times per day or as directed.  You should exercise both the operative leg and the  other leg as well.   Exercises include:   Quad Sets - Tighten up the muscle on the front of the thigh (Quad) and hold for 5-10 seconds.   Straight Leg Raises - With your knee straight (if you were given a brace, keep it on), lift the leg to 60 degrees, hold for 3 seconds, and slowly lower the leg.  Perform this exercise against resistance later as your leg gets stronger.  Leg Slides: Lying on your back, slowly slide your foot toward your buttocks, bending your knee up off the floor (only go as far as is comfortable). Then slowly slide your foot back down until your leg is flat on the floor again.  Angel Wings: Lying on your back spread your legs to the side as far apart as you can without causing discomfort.  Hamstring Strength:  Lying on your back, push your heel against the floor with your leg straight by tightening up the muscles of your buttocks.  Repeat, but this time bend your knee to a comfortable angle, and push your heel against the floor.  You may put a pillow under the heel to make it more comfortable if necessary.   A rehabilitation program following joint replacement surgery can speed recovery and prevent re-injury  in the future due to weakened muscles. Contact your doctor or a physical therapist for more information on knee rehabilitation.    CONSTIPATION  Constipation is defined medically as fewer than three stools per week and severe constipation as less than one stool per week.  Even if you have a regular bowel pattern at home, your normal regimen is likely to be disrupted due to multiple reasons following surgery.  Combination of anesthesia, postoperative narcotics, change in appetite and fluid intake all can affect your bowels.   YOU MUST use at least one of the following options; they are listed in order of increasing strength to get the job done.  They are all available over the counter, and you may need to use some, POSSIBLY even all of these options:    Drink plenty of fluids (prune juice may be helpful) and high fiber foods Colace 100 mg by mouth twice a day  Senokot for constipation as directed and as needed Dulcolax (bisacodyl), take with full glass of water  Miralax (polyethylene glycol) once or twice a day as needed.  If you have tried all these things and are unable to have a bowel movement in the first 3-4 days after surgery call either your surgeon or your primary doctor.    If you experience loose stools or diarrhea, hold the medications until you stool forms back up.  If your symptoms do not get better within 1 week or if they get worse, check with your doctor.  If you experience "the worst abdominal pain ever" or develop nausea or vomiting, please contact the office immediately for further recommendations for treatment.   ITCHING:  If you experience itching with your medications, try taking only a single pain pill, or even half a pain pill at a time.  You can also use Benadryl over the counter for itching or also to help with sleep.   TED HOSE STOCKINGS:  Use stockings on both legs until for at least 2 weeks or as directed by physician office. They may be removed at night for  sleeping.  MEDICATIONS:  See your medication summary on the "After Visit Summary" that nursing will review with you.  You may have some home medications which  will be placed on hold until you complete the course of blood thinner medication.  It is important for you to complete the blood thinner medication as prescribed.  PRECAUTIONS:  If you experience chest pain or shortness of breath - call 911 immediately for transfer to the hospital emergency department.   If you develop a fever greater that 101 F, purulent drainage from wound, increased redness or drainage from wound, foul odor from the wound/dressing, or calf pain - CONTACT YOUR SURGEON.                                                   FOLLOW-UP APPOINTMENTS:  If you do not already have a post-op appointment, please call the office for an appointment to be seen by your surgeon.  Guidelines for how soon to be seen are listed in your "After Visit Summary", but are typically between 1-4 weeks after surgery.  OTHER INSTRUCTIONS:   Knee Replacement:  Do not place pillow under knee, focus on keeping the knee straight while resting. CPM instructions: 0-90 degrees, 2 hours in the morning, 2 hours in the afternoon, and 2 hours in the evening. Place foam block, curve side up under heel at all times except when in CPM or when walking.  DO NOT modify, tear, cut, or change the foam block in any way.  MAKE SURE YOU:  Understand these instructions.  Get help right away if you are not doing well or get worse.    Thank you for letting us be a part of your medical care team.  It is a privilege we respect greatly.  We hope these instructions will help you stay on track for a fast and full recovery!   Increase activity slowly as tolerated    Complete by:  As directed      Allergies as of 03/19/2017      Reactions   Codeine Other (See Comments)   Severe Headaches with codeine; but not with other opioids   Percocet [oxycodone-acetaminophen] Other (See  Comments)   hallucinations      Medication List    TAKE these medications   ACCU-CHEK FASTCLIX LANCETS Misc 1 each by Other route 2 (two) times daily. Use 1 lancet bid   ACCU-CHEK SMARTVIEW test strip Generic drug:  glucose blood 1 strip by Other route 2 (two) times daily. Use 1 strip to check glucose twice a day   amLODipine 5 MG tablet Commonly known as:  NORVASC Take 5 mg by mouth daily.   apixaban 5 MG Tabs tablet Commonly known as:  ELIQUIS Take 1 tablet (5 mg total) by mouth 2 (two) times daily.   baclofen 10 MG tablet Commonly known as:  LIORESAL Take 1 tablet (10 mg total) by mouth 3 (three) times daily.   diphenhydramine-acetaminophen 25-500 MG Tabs tablet Commonly known as:  TYLENOL PM Take 1 tablet by mouth at bedtime as needed (for pain/sleep).   hydrochlorothiazide 25 MG tablet Commonly known as:  HYDRODIURIL Take 25 mg by mouth daily.   HYDROmorphone 2 MG tablet Commonly known as:  DILAUDID Take 1-2 tablets (2-4 mg total) by mouth every 4 (four) hours as needed for severe pain.   levothyroxine 125 MCG tablet Commonly known as:  SYNTHROID, LEVOTHROID Take 125 mcg by mouth daily.   losartan 100 MG tablet Commonly known as:  COZAAR  Take 100 mg by mouth daily.   metFORMIN 500 MG tablet Commonly known as:  GLUCOPHAGE Take 500-1,000 mg by mouth 2 (two) times daily. 500 mg in the morning & 1000 mg in the evening   predniSONE 5 MG tablet Commonly known as:  DELTASONE Take 2.5 mg by mouth daily with breakfast.   Vitamin D3 1000 units Caps Take 1,000 Units by mouth daily.        SignedMarkham Jordan: Kharson Rasmusson L 03/19/2017, 7:57 AM

## 2017-03-19 NOTE — Evaluation (Signed)
Physical Therapy Evaluation Patient Details Name: Destiny Brown MRN: [id number] DOB: 23-Aug-1946 Today's Date: 03/19/2017   History of Present Illness  s/p R TKA  Clinical Impression  Pt s/p R TKR and presents with decreased R LE strength/ROM and post op pain limiting functional mobility.  Pt should progress to dc home with family assist and HHPT follow up.    Follow Up Recommendations Home health PT    Equipment Recommendations  None recommended by PT    Recommendations for Other Services OT consult     Precautions / Restrictions Precautions Precautions: Knee;Fall Required Braces or Orthoses: Knee Immobilizer - Right Knee Immobilizer - Right: Discontinue once straight leg raise with < 10 degree lag Restrictions Weight Bearing Restrictions: No Other Position/Activity Restrictions: WBAT      Mobility  Bed Mobility Overal bed mobility: Needs Assistance Bed Mobility: Supine to Sit     Supine to sit: Min assist;Mod assist     General bed mobility comments: cues for sequence and use of L LE to self assist  Transfers Overall transfer level: Needs assistance Equipment used: Rolling walker (2 wheeled) Transfers: Sit to/from Stand Sit to Stand: Min assist         General transfer comment: cues for LE management and use of UEs to self assist  Ambulation/Gait Ambulation/Gait assistance: Min assist Ambulation Distance (Feet): 38 Feet Assistive device: Rolling walker (2 wheeled) Gait Pattern/deviations: Step-to pattern;Decreased step length - right;Decreased step length - left;Shuffle;Trunk flexed Gait velocity: decr Gait velocity interpretation: Below normal speed for age/gender General Gait Details: cues for sequence, posture and position from [employer]  Stairs            Wheelchair Mobility    Modified Rankin (Stroke Patients Only)       Balance                                             Pertinent Vitals/Pain Pain Assessment:  0-10 Pain Score: 5  Faces Pain Scale: Hurts a little bit Pain Location: R knee Pain Descriptors / Indicators: Sore Pain Intervention(s): Limited activity within patient's tolerance;Monitored during session;Premedicated before session;Ice applied    Home Living Family/patient expects to be discharged to:: Private residence Living Arrangements: Spouse/significant other Available Help at Discharge: Family Type of Home: House Home Access: Stairs to enter Entrance Stairs-Rails: Right;Left;Can reach both Secretary/administratorntrance Stairs-Number of Steps: 3 Home Layout: One level Home Equipment: Walker - 2 wheels;Grab bars - tub/shower Additional Comments: husband is checking on condition of RW    Prior Function Level of Independence: Independent with assistive device(s);Independent               Hand Dominance        Extremity/Trunk Assessment   Upper Extremity Assessment Upper Extremity Assessment: Overall WFL for tasks assessed    Lower Extremity Assessment Lower Extremity Assessment: RLE deficits/detail RLE Deficits / Details: 2+/5 quads with AAROM at knee -10- 40    Cervical / Trunk Assessment Cervical / Trunk Assessment: Normal  Communication   Communication: No difficulties  Cognition Arousal/Alertness: Awake/alert Behavior During Therapy: WFL for tasks assessed/performed Overall Cognitive Status: Within Functional Limits for tasks assessed  General Comments      Exercises Total Joint Exercises Ankle Circles/Pumps: AROM;Both;15 reps;Supine Quad Sets: AROM;Both;10 reps;Supine Heel Slides: AAROM;Right;15 reps;Supine Straight Leg Raises: AAROM;Right;10 reps;Supine   Assessment/Plan    PT Assessment Patient needs continued PT services  PT Problem List Decreased strength;Decreased range of motion;Decreased activity tolerance;Decreased mobility;Pain;Decreased knowledge of use of DME       PT Treatment Interventions  DME instruction;Gait training;Stair training;Functional mobility training;Therapeutic activities;Therapeutic exercise;Patient/family education    PT Goals (Current goals can be found in the Care Plan section)  Acute Rehab PT Goals Patient Stated Goal: home PT Goal Formulation: With patient Time For Goal Achievement: 03/23/17 Potential to Achieve Goals: Good    Frequency 7X/week   Barriers to discharge        Co-evaluation               End of Session Equipment Utilized During Treatment: Gait belt;Right knee immobilizer Activity Tolerance: Patient tolerated treatment well Patient left: in chair;with call bell/phone within reach;with family/visitor present Nurse Communication: Mobility status PT Visit Diagnosis: Unsteadiness on feet (R26.81)    Time: 4098-1191 PT Time Calculation (min) (ACUTE ONLY): 38 min   Charges:   PT Evaluation $PT Eval Low Complexity: 1 Procedure PT Treatments $Gait Training: 8-22 mins $Therapeutic Exercise: 8-22 mins   PT G Codes:        Destiny Brown 03-28-2017, 1:22 PM

## 2017-03-19 NOTE — Care Management Note (Signed)
Case Management Note  Patient Details  Name: Destiny Brown MRN: [id number] Date of Birth: 1946-06-05  Subjective/Objective: PT-recc HHPT, already ordered. [facility] rep Tim following. Patient does not have a rw of her own, or 3n1. Her spouse will check @ home for other family members rw,& 3n1.May need these @ d/c. Continue to monitor.                   Action/Plan:d/c plan home w/HHC.   Expected Discharge Date:  03/19/17               Expected Discharge Plan:  Home w Home Health Services  In-House Referral:     Discharge planning Services  CM Consult  Post Acute Care Choice:  Durable Medical Equipment (Has cane) Choice offered to:     DME Arranged:    DME Agency:     HH Arranged:  PT HH Agency:  [facility] (formerly [facility])  Status of Service:  In process, will continue to follow  If discussed at Long Length of Stay Meetings, dates discussed:    Additional Comments:  Lanier ClamMahabir, Sanii Kukla, RN 03/19/2017, 3:29 PM

## 2017-03-20 LAB — BASIC METABOLIC PANEL
ANION GAP: 7 (ref 5–15)
BUN: 15 mg/dL (ref 6–20)
CHLORIDE: 101 mmol/L (ref 101–111)
CO2: 25 mmol/L (ref 22–32)
Calcium: 8.8 mg/dL — ABNORMAL LOW (ref 8.9–10.3)
Creatinine, Ser: 1.19 mg/dL — ABNORMAL HIGH (ref 0.44–1.00)
GFR calc Af Amer: 52 mL/min — ABNORMAL LOW (ref >60)
GFR calc non Af Amer: 44 mL/min — ABNORMAL LOW (ref >60)
GLUCOSE: 173 mg/dL — AB (ref 65–99)
Potassium: 4.2 mmol/L (ref 3.5–5.1)
SODIUM: 133 mmol/L — AB (ref 135–145)

## 2017-03-20 LAB — CBC
HCT: 34.6 % — ABNORMAL LOW (ref 36.0–46.0)
Hemoglobin: 11.4 g/dL — ABNORMAL LOW (ref 12.0–15.0)
MCH: 29.5 pg (ref 26.0–34.0)
MCHC: 32.9 g/dL (ref 30.0–36.0)
MCV: 89.4 fL (ref 78.0–100.0)
Platelets: 216 10*3/uL (ref 150–400)
RBC: 3.87 MIL/uL (ref 3.87–5.11)
RDW: 13.4 % (ref 11.5–15.5)
WBC: 11.8 10*3/uL — AB (ref 4.0–10.5)

## 2017-03-20 LAB — GLUCOSE, CAPILLARY
GLUCOSE-CAPILLARY: 149 mg/dL — AB (ref 65–99)
GLUCOSE-CAPILLARY: 163 mg/dL — AB (ref 65–99)

## 2017-03-20 NOTE — Progress Notes (Signed)
Contacted [facility] DME rep for 3n1 for home. Pt has RW in the room. Spoke to pt and husband at bedside. Has good family support. Isidoro DonningAlesia Quantavia Frith RN CCM Case Mgmt phone [phone]

## 2017-03-20 NOTE — Progress Notes (Signed)
qPhysical Therapy Treatment Patient Details Name: Destiny SievingSharon A Krinke MRN: [id number] DOB: [birth year] Today's Date: 03/20/2017    History of Present Illness s/p R TKA    PT Comments    Pt progressing well and eager for dc home.  Spouse present and reviewed stairs, don/doff KI and home therex.   Follow Up Recommendations  Home health PT     Equipment Recommendations  None recommended by PT    Recommendations for Other Services OT consult     Precautions / Restrictions Precautions Precautions: Knee;Fall Required Braces or Orthoses: Knee Immobilizer - Right Knee Immobilizer - Right: Discontinue once straight leg raise with < 10 degree lag Restrictions Weight Bearing Restrictions: No Other Position/Activity Restrictions: WBAT    Mobility  Bed Mobility               General bed mobility comments: Pt OOB and requests back to chair  Transfers Overall transfer level: Needs assistance Equipment used: Rolling walker (2 wheeled) Transfers: Sit to/from Stand Sit to Stand: Min guard;Supervision         General transfer comment: cues for LE management and use of UEs to self assist  Ambulation/Gait Ambulation/Gait assistance: Min guard;Supervision Ambulation Distance (Feet): 40 Feet Assistive device: Rolling walker (2 wheeled) Gait Pattern/deviations: Step-to pattern;Decreased step length - right;Decreased step length - left;Shuffle;Trunk flexed Gait velocity: decr Gait velocity interpretation: Below normal speed for age/gender General Gait Details: Increased time with cues for sequence, posture and position from RW   Stairs Stairs: Yes   Stair Management: Two rails;Step to pattern;Forwards Number of Stairs: 5 General stair comments: cues for sequence and foot placement  Wheelchair Mobility    Modified Rankin (Stroke Patients Only)       Balance                                            Cognition Arousal/Alertness:  Awake/alert Behavior During Therapy: WFL for tasks assessed/performed Overall Cognitive Status: Within Functional Limits for tasks assessed                                        Exercises Total Joint Exercises Ankle Circles/Pumps: AROM;Both;Supine;20 reps Quad Sets: AROM;Both;Supine;15 reps Heel Slides: AAROM;Right;Supine;20 reps Straight Leg Raises: Right;Supine;20 reps;AAROM;AROM    General Comments        Pertinent Vitals/Pain Pain Assessment: 0-10 Pain Score: 4  Pain Location: R knee Pain Descriptors / Indicators: Sore Pain Intervention(s): Limited activity within patient's tolerance;Monitored during session;Premedicated before session    Home Living                      Prior Function            PT Goals (current goals can now be found in the care plan section) Acute Rehab PT Goals Patient Stated Goal: home PT Goal Formulation: With patient Time For Goal Achievement: 03/23/17 Potential to Achieve Goals: Good Progress towards PT goals: Progressing toward goals    Frequency    7X/week      PT Plan Current plan remains appropriate    Co-evaluation             End of Session Equipment Utilized During Treatment: Gait belt Activity Tolerance: Patient tolerated treatment well;Patient limited by fatigue Patient left: with  call bell/phone within reach;in chair;with family/visitor present Nurse Communication: Mobility status PT Visit Diagnosis: Unsteadiness on feet (R26.81)     Time: 1610-9604 PT Time Calculation (min) (ACUTE ONLY): 25 min  Charges:  $Gait Training: 8-22 mins $Therapeutic Exercise: 8-22 mins $Therapeutic Activity: 8-22 mins                    G Codes:       [id number]   Josejuan Hoaglin 03/20/2017, 11:51 AM

## 2017-03-20 NOTE — Progress Notes (Signed)
qPhysical Therapy Treatment Patient Details Name: Destiny Brown MRN: [id number] DOB: 10-29-1946 Today's Date: 03/20/2017    History of Present Illness s/p R TKA    PT Comments    Pt progressing well with mobility and eager for dc home.  Follow Up Recommendations  Home health PT     Equipment Recommendations  None recommended by PT    Recommendations for Other Services OT consult     Precautions / Restrictions Precautions Precautions: Knee;Fall Required Braces or Orthoses: Knee Immobilizer - Right Knee Immobilizer - Right: Discontinue once straight leg raise with < 10 degree lag (Pt performed IND SLR this am) Restrictions Weight Bearing Restrictions: No Other Position/Activity Restrictions: WBAT    Mobility  Bed Mobility               General bed mobility comments: Pt OOB with nursing and requests back to chair  Transfers Overall transfer level: Needs assistance Equipment used: Rolling walker (2 wheeled) Transfers: Sit to/from Stand Sit to Stand: Min guard         General transfer comment: cues for LE management and use of UEs to self assist  Ambulation/Gait Ambulation/Gait assistance: Min assist;Min guard Ambulation Distance (Feet): 75 Feet Assistive device: Rolling walker (2 wheeled) Gait Pattern/deviations: Step-to pattern;Decreased step length - right;Decreased step length - left;Shuffle;Trunk flexed Gait velocity: decr Gait velocity interpretation: Below normal speed for age/gender General Gait Details: Increased time with cues for sequence, posture and position from [employer]   Stairs            Wheelchair Mobility    Modified Rankin (Stroke Patients Only)       Balance                                            Cognition Arousal/Alertness: Awake/alert Behavior During Therapy: WFL for tasks assessed/performed Overall Cognitive Status: Within Functional Limits for tasks assessed                                        Exercises Total Joint Exercises Ankle Circles/Pumps: AROM;Both;Supine;20 reps Quad Sets: AROM;Both;Supine;15 reps Heel Slides: AAROM;Right;Supine;20 reps Straight Leg Raises: Right;Supine;20 reps;AAROM;AROM    General Comments        Pertinent Vitals/Pain Pain Assessment: 0-10 Pain Score: 5  Pain Location: R knee Pain Descriptors / Indicators: Sore Pain Intervention(s): Limited activity within patient's tolerance;Monitored during session;Premedicated before session;Ice applied    Home Living                      Prior Function            PT Goals (current goals can now be found in the care plan section) Acute Rehab PT Goals Patient Stated Goal: home PT Goal Formulation: With patient Time For Goal Achievement: 03/23/17 Potential to Achieve Goals: Good Progress towards PT goals: Progressing toward goals    Frequency    7X/week      PT Plan Current plan remains appropriate    Co-evaluation             End of Session Equipment Utilized During Treatment: Gait belt Activity Tolerance: Patient tolerated treatment well;Patient limited by fatigue Patient left: with call bell/phone within reach;in chair;with family/visitor present Nurse Communication: Mobility status PT Visit Diagnosis:  Unsteadiness on feet (R26.81)     Time: 1610-9604 PT Time Calculation (min) (ACUTE ONLY): 30 min  Charges:  $Gait Training: 8-22 mins $Therapeutic Exercise: 8-22 mins                    G Codes:       [id number]   Reegan Mctighe 03/20/2017, 11:47 AM

## 2017-03-20 NOTE — Progress Notes (Signed)
   Subjective: 2 Days Post-Op Procedure(s) (LRB): RIGHT TOTAL KNEE ARTHROPLASTY (Right)  Pt doing well Mild soreness/pain in the knee Therapy going well Denies any new symptoms or issues  Patient reports pain as mild.  Objective:   VITALS:   Vitals:   03/19/17 2130 03/20/17 0546  BP: (!) 171/70 139/64  Pulse: 88 85  Resp: 16 16  Temp: 100 F (37.8 C) 99.2 F (37.3 C)    Right knee incision healing well nv intact distally No rashes or edema Good rom  LABS  Recent Labs  03/19/17 0427 03/20/17 0412  HGB 11.0* 11.4*  HCT 33.5* 34.6*  WBC 9.7 11.8*  PLT 207 216     Recent Labs  03/19/17 0427 03/20/17 0412  NA 139 133*  K 4.6 4.2  BUN 18 15  CREATININE 1.43* 1.19*  GLUCOSE 139* 173*     Assessment/Plan: 2 Days Post-Op Procedure(s) (LRB): RIGHT TOTAL KNEE ARTHROPLASTY (Right) PT/OT today then d/c home f/u in 2 weeks in the office Overall pt doing well    Destiny Brown OverallBrad Jamarea Brown, MPAS, PA-C  03/20/2017, 7:22 AM

## 2017-03-24 NOTE — Anesthesia Postprocedure Evaluation (Addendum)
Anesthesia Post Note  Patient: Destiny SievingSharon A Brown  Procedure(s) Performed: Procedure(s) (LRB): RIGHT TOTAL KNEE ARTHROPLASTY (Right)  Patient location during evaluation: PACU Anesthesia Type: Spinal Level of consciousness: oriented and awake and alert Pain management: pain level controlled Vital Signs Assessment: post-procedure vital signs reviewed and stable Respiratory status: spontaneous breathing, respiratory function stable and patient connected to nasal cannula oxygen Cardiovascular status: blood pressure returned to baseline and stable Postop Assessment: no headache and no backache Anesthetic complications: no       Last Vitals:  Vitals:   03/19/17 2130 03/20/17 0546  BP: (!) 171/70 139/64  Pulse: 88 85  Resp: 16 16  Temp: 37.8 C 37.3 C    Last Pain:  Vitals:   03/20/17 0913  TempSrc:   PainSc: 2                  Baylen Dea,JAMES TERRILL

## 2017-04-20 ENCOUNTER — Other Ambulatory Visit (HOSPITAL_COMMUNITY): Payer: Self-pay | Admitting: Internal Medicine

## 2017-04-20 ENCOUNTER — Ambulatory Visit (HOSPITAL_COMMUNITY)
Admission: RE | Admit: 2017-04-20 | Discharge: 2017-04-20 | Disposition: A | Discharge status: Home / Self Care | Payer: Medicare Other | Source: Ambulatory Visit | Attending: Cardiovascular Disease | Admitting: Cardiovascular Disease

## 2017-04-20 DIAGNOSIS — M79606 Pain in leg, unspecified: Secondary | ICD-10-CM | POA: Insufficient documentation

## 2017-04-20 DIAGNOSIS — M7989 Other specified soft tissue disorders: Secondary | ICD-10-CM | POA: Insufficient documentation

## 2017-05-28 NOTE — Addendum Note (Signed)
Addendum  created 05/28/17 1245 by Kilynn Fitzsimmons, MD   Sign clinical note    

## 2017-10-29 ENCOUNTER — Other Ambulatory Visit: Payer: Self-pay | Admitting: Internal Medicine

## 2017-10-29 ENCOUNTER — Ambulatory Visit
Admission: RE | Admit: 2017-10-29 | Discharge: 2017-10-29 | Disposition: A | Discharge status: Home / Self Care | Payer: Medicare Other | Source: Ambulatory Visit | Attending: Internal Medicine | Admitting: Internal Medicine

## 2017-10-29 DIAGNOSIS — R05 Cough: Secondary | ICD-10-CM

## 2017-10-29 DIAGNOSIS — R059 Cough, unspecified: Secondary | ICD-10-CM

## 2018-01-06 ENCOUNTER — Ambulatory Visit
Admission: RE | Admit: 2018-01-06 | Discharge: 2018-01-06 | Disposition: A | Discharge status: Home / Self Care | Payer: Medicare Other | Source: Ambulatory Visit | Attending: Internal Medicine | Admitting: Internal Medicine

## 2018-01-06 ENCOUNTER — Other Ambulatory Visit: Payer: Self-pay | Admitting: Internal Medicine

## 2018-01-06 DIAGNOSIS — Z09 Encounter for follow-up examination after completed treatment for conditions other than malignant neoplasm: Secondary | ICD-10-CM

## 2018-01-17 ENCOUNTER — Telehealth: Payer: Self-pay | Admitting: Internal Medicine

## 2018-01-17 NOTE — Telephone Encounter (Signed)
New Message   Patient is calling because she feels like her heart is beating fast. She indicates that her blood pressure and pulse rate are good though. Please call.

## 2018-01-17 NOTE — Telephone Encounter (Signed)
Spoke with patient concerned about her heart pounding in her chest. Her HR is (70) and BP (159/70), which is slightly elevated for her.  She has Afib and is on Eliquis.  She called to see if this was an urgent issue, because overall she is asymptomatic with occasional SOB and some dizziness.    She will monitor these vital signs and document any symptoms which may occur.   She has an appointment late in March, but we may need to reschedule sooner if symptoms become more frequent. Patient verbalized understanding and will keep us posted.

## 2018-01-19 ENCOUNTER — Other Ambulatory Visit: Payer: Self-pay | Admitting: Internal Medicine

## 2018-01-19 DIAGNOSIS — R9389 Abnormal findings on diagnostic imaging of other specified body structures: Secondary | ICD-10-CM

## 2018-01-19 NOTE — Telephone Encounter (Signed)
With new symptoms, even with diagnosis of Afib, would suggest monitoring ALIVECOR is cheapeast and hangs out on her cell phone Alternatively 30d monitor

## 2018-01-20 ENCOUNTER — Other Ambulatory Visit: Payer: Self-pay | Admitting: Internal Medicine

## 2018-01-20 DIAGNOSIS — Z1239 Encounter for other screening for malignant neoplasm of breast: Secondary | ICD-10-CM

## 2018-01-21 ENCOUNTER — Ambulatory Visit
Admission: RE | Admit: 2018-01-21 | Discharge: 2018-01-21 | Disposition: A | Discharge status: Home / Self Care | Payer: Medicare Other | Source: Ambulatory Visit | Attending: Internal Medicine | Admitting: Internal Medicine

## 2018-01-21 DIAGNOSIS — R9389 Abnormal findings on diagnostic imaging of other specified body structures: Secondary | ICD-10-CM

## 2018-01-21 NOTE — Telephone Encounter (Signed)
Spoke with Destiny Brown this morning regarding Dr Odessa FlemingKlein's suggestion of ALIVECOR vs a 30 day monitor. Destiny Brown states she's been getting frequent heart palpitations. She stated she plans on purchasing the ALIVECOR and will call back for an appointment if her episodes become more frequent.

## 2018-03-07 ENCOUNTER — Other Ambulatory Visit: Payer: Self-pay | Admitting: Sports Medicine

## 2018-03-07 DIAGNOSIS — M47817 Spondylosis without myelopathy or radiculopathy, lumbosacral region: Secondary | ICD-10-CM

## 2018-03-08 ENCOUNTER — Ambulatory Visit
Admission: RE | Admit: 2018-03-08 | Discharge: 2018-03-08 | Disposition: A | Discharge status: Home / Self Care | Payer: Medicare Other | Source: Ambulatory Visit | Attending: Internal Medicine | Admitting: Internal Medicine

## 2018-03-08 DIAGNOSIS — Z1239 Encounter for other screening for malignant neoplasm of breast: Secondary | ICD-10-CM

## 2018-03-17 ENCOUNTER — Ambulatory Visit
Admission: RE | Admit: 2018-03-17 | Discharge: 2018-03-17 | Disposition: A | Discharge status: Home / Self Care | Payer: Medicare Other | Source: Ambulatory Visit | Attending: Sports Medicine | Admitting: Sports Medicine

## 2018-03-17 DIAGNOSIS — M47817 Spondylosis without myelopathy or radiculopathy, lumbosacral region: Secondary | ICD-10-CM

## 2018-03-17 MED ORDER — GADOBENATE DIMEGLUMINE 529 MG/ML IV SOLN
19.0000 mL | Freq: Once | INTRAVENOUS | Status: AC | PRN
Start: 2018-03-17 — End: 2018-03-17
  Administered 2018-03-17: 19 mL via INTRAVENOUS

## 2018-03-18 ENCOUNTER — Encounter: Payer: Self-pay | Admitting: Internal Medicine

## 2018-03-18 ENCOUNTER — Ambulatory Visit: Payer: Medicare Other | Admitting: Internal Medicine

## 2018-03-18 VITALS — BP 132/80 | HR 80 | Ht 65.0 in | Wt 208.0 lb

## 2018-03-18 DIAGNOSIS — I480 Paroxysmal atrial fibrillation: Secondary | ICD-10-CM | POA: Diagnosis not present

## 2018-03-18 DIAGNOSIS — I447 Left bundle-branch block, unspecified: Secondary | ICD-10-CM | POA: Diagnosis not present

## 2018-03-18 LAB — CBC WITH DIFFERENTIAL/PLATELET
BASOS: 0 %
Basophils Absolute: 0 10*3/uL (ref 0.0–0.2)
EOS (ABSOLUTE): 0.1 10*3/uL (ref 0.0–0.4)
EOS: 1 %
Hematocrit: 37.9 % (ref 34.0–46.6)
Hemoglobin: 12.2 g/dL (ref 11.1–15.9)
IMMATURE GRANULOCYTES: 1 %
Immature Grans (Abs): 0 10*3/uL (ref 0.0–0.1)
Lymphocytes Absolute: 1.1 10*3/uL (ref 0.7–3.1)
Lymphs: 19 %
MCH: 28.8 pg (ref 26.6–33.0)
MCHC: 32.2 g/dL (ref 31.5–35.7)
MCV: 90 fL (ref 79–97)
MONOS ABS: 0.3 10*3/uL (ref 0.1–0.9)
Monocytes: 6 %
NEUTROS ABS: 4.1 10*3/uL (ref 1.4–7.0)
NEUTROS PCT: 73 %
PLATELETS: 244 10*3/uL (ref 150–379)
RBC: 4.23 x10E6/uL (ref 3.77–5.28)
RDW: 14.3 % (ref 12.3–15.4)
WBC: 5.6 10*3/uL (ref 3.4–10.8)

## 2018-03-18 LAB — BASIC METABOLIC PANEL
BUN / CREAT RATIO: 16 (ref 12–28)
BUN: 18 mg/dL (ref 8–27)
CO2: 22 mmol/L (ref 20–29)
CREATININE: 1.14 mg/dL — AB (ref 0.57–1.00)
Calcium: 9.5 mg/dL (ref 8.7–10.3)
Chloride: 100 mmol/L (ref 96–106)
GFR, EST AFRICAN AMERICAN: 55 mL/min/{1.73_m2} — AB (ref >59)
GFR, EST NON AFRICAN AMERICAN: 48 mL/min/{1.73_m2} — AB (ref >59)
Glucose: 266 mg/dL — ABNORMAL HIGH (ref 65–99)
POTASSIUM: 4.6 mmol/L (ref 3.5–5.2)
SODIUM: 137 mmol/L (ref 134–144)

## 2018-03-18 NOTE — Progress Notes (Signed)
Patient Care Team: Nila Nephew, MD as PCP - General (Internal Medicine) Ernesto Rutherford, MD as Consulting Physician (Ophthalmology)   HPI  Destiny Brown is a 72 y.o. female Seen in followup for PAF presenting with RVR 9/15   She has LBBB.  HTN Dm  EF 55%;  myoview neg for ischemia  She has been having recurrent palpitations, she is using the AliveCor monitor to try to elucidate the mechanism.  She has mild shortness of breath.  She is not having chest pain.  No peripheral edema.   Thromboembolic risk factors ( age -92, HTN-1, DM-1, Gender-1) for a CHADSVASc Score of 4      Date Cr Hgb  3/18 1.19 11.4          Past Medical History:  Diagnosis Date  . Arthritis   . Atrial fibrillation with RVR (HCC) 09/21/2014  . Bronchitis    h/o  . Diabetes mellitus    NO MEDS,  DIET CONTROLLED  . Headache(784.0)   . History of migraines    "when working under alot of stress"  . Hypertension   . Hypothyroid   . LBBB (left bundle branch block) 09/22/2014  . OSA (obstructive sleep apnea) 04/09/2015   Moderate OSA with AHI 16.7 events per hour with successful CPAP titration to 8cm H2O  . PONV (postoperative nausea and vomiting)    PT DOESNOT WANT TO TAKE HER ATENOLOL     DOS.   " IT DROPS MY BP TO LOW"    Past Surgical History:  Procedure Laterality Date  . ABDOMINAL HYSTERECTOMY  03/2001  . APPENDECTOMY    . BACK SURGERY    . CATARACT EXTRACTION, BILATERAL     WITH IMPLANTS  . CERVICAL FUSION  08/2003  . CHOLECYSTECTOMY  03/2010  . EYE SURGERY    . LUMBAR DISC SURGERY    . LUMBAR LAMINECTOMY/DECOMPRESSION MICRODISCECTOMY  10/2010  . POSTERIOR FUSION LUMBAR SPINE  12/16/11; 12/2005; 07/2004   L2-3; L3-4; L4-5  . TOTAL KNEE ARTHROPLASTY Right 03/18/2017   Procedure: RIGHT TOTAL KNEE ARTHROPLASTY;  Surgeon: Eugenia Mcalpine, MD;  Location: [facility];  Service: Orthopedics;  Laterality: Right;    Current Outpatient Medications  Medication Sig Dispense Refill  . ACCU-CHEK  FASTCLIX LANCETS MISC 1 each by Other route 2 (two) times daily. Use 1 lancet bid    . ACCU-CHEK SMARTVIEW test strip 1 strip by Other route 2 (two) times daily. Use 1 strip to check glucose twice a day    . amLODipine (NORVASC) 5 MG tablet Take 5 mg by mouth daily.    Marland Kitchen apixaban (ELIQUIS) 5 MG TABS tablet Take 1 tablet (5 mg total) by mouth 2 (two) times daily. 180 tablet 1  . Cholecalciferol (VITAMIN D3) 1000 UNITS CAPS Take 1,000 Units by mouth daily.      . hydrochlorothiazide (HYDRODIURIL) 25 MG tablet Take 25 mg by mouth daily.    Marland Kitchen levothyroxine (SYNTHROID, LEVOTHROID) 125 MCG tablet Take 125 mcg by mouth daily.     Marland Kitchen losartan (COZAAR) 100 MG tablet Take 100 mg by mouth daily.    . metFORMIN (GLUCOPHAGE) 500 MG tablet Take 500-1,000 mg by mouth 2 (two) times daily. 500 mg in the morning & 1000 mg in the evening    . predniSONE (DELTASONE) 5 MG tablet Take 2.5 mg by mouth daily with breakfast.      No current facility-administered medications for this visit.     Allergies  Allergen Reactions  .  Codeine Other (See Comments)    Severe Headaches with codeine; but not with other opioids  . Percocet [Oxycodone-Acetaminophen] Other (See Comments)    hallucinations    Review of Systems negative except from HPI and PMH  Physical Exam BP 132/80   Pulse 80   Ht 5\' 5"  (1.651 m)   Wt 208 lb (94.3 kg)   SpO2 98%   BMI 34.61 kg/m  Well developed and nourished in no acute distress HENT normal Neck supple with JVP-flat Carotids brisk and full without bruits Clear Regular rate and rhythm, no murmurs or gallops Abd-soft with active BS without hepatomegaly No Clubbing cyanosis edema Skin-warm and dry A & Oriented  Grossly normal sensory and motor function   ECG demonstrates sinus with LBBB variable P wave amplitudes such that they are largely nondiscernible some of the time 83 14/13/39 LBBB  Assessment and  Plan  Paroxysmal atrial fibrillation  Left bundle branch  block  Hypertension  Daytime sleepiness  Palpitations   Her AliveCor strips were reviewed.  P waves were difficult to discern but there also not discernible on her ECG.  Blood pressures well controlled.    No bleeding on the Eliquis.  Continue to use the AliveCor monitor and follow-up.

## 2018-03-18 NOTE — Patient Instructions (Addendum)
Medication Instructions:  Your physician recommends that you continue on your current medications as directed. Please refer to the Current Medication list given to you today.  Labwork: You will have labs drawn today: CBC and BMP  Testing/Procedures: None ordered.  Follow-Up: Your physician recommends that you schedule a follow-up appointment in:   One year with Francis Dowseenee Ursuy, PA    Any Other Special Instructions Will Be Listed Below (If Applicable).     If you need a refill on your cardiac medications before your next appointment, please call your pharmacy.

## 2018-11-22 ENCOUNTER — Telehealth: Payer: Self-pay | Admitting: Internal Medicine

## 2018-11-22 ENCOUNTER — Telehealth: Payer: Self-pay | Admitting: *Deleted

## 2018-11-22 NOTE — Telephone Encounter (Signed)
Please contact patient. She can hold Eliquis for 3 days prior to her procedure.  Notes indicate her procedure is for tomorrow. If she has not held her Eliquis yet, she will have to call her surgeon and reschedule her procedure.  I will send letter to the surgeon.  Please call surgeon's office to make sure it was received.  This note will be removed from the preop pool. Tereso NewcomerScott Kseniya Grunden, PA-C    11/22/2018 5:43 PM

## 2018-11-22 NOTE — Telephone Encounter (Signed)
Patient with diagnosis of Afib on Eliquis for anticoagulation.    Procedure: lumber spinal injection Date of procedure: 11/23/18  CHADS2-VASc score of  4 (CHF, HTN, AGE, DM2, stroke/tia x 2, CAD, AGE, female)  CrCl 6965ml/min  Per office protocol, patient can hold Eliquis for 3 days prior to procedure.    More than likely will need to reschedule spinal injection as I doubt patient held Eliquis.

## 2018-11-22 NOTE — Telephone Encounter (Signed)
New Message:    Marily Lenteearsa from [facility] Dr. Ollen BowlHarkins office returning your call back.

## 2018-11-22 NOTE — Telephone Encounter (Signed)
Per Dr. Ollen BowlHarkins office there will be no type of anesthesia being used.

## 2018-11-22 NOTE — Telephone Encounter (Signed)
   [facility] Pre-operative Risk Assessment    Request for surgical clearance:  1. What type of surgery is being performed? LUMBAR SPINE INJECTION   2. When is this surgery scheduled? 11/23/18   3. Are there any medications that need to be held prior to surgery and how long? ELIQUIS X 3 DAYS PRIOR    4. Practice name and name of physician performing surgery? [facility]; DR. HARKINS   5. What is your office phone and fax number? PH# [phone]; FAX# [phone]   6. Anesthesia type (None, local, MAC, general) ? CALLED TO VERIFY IF POSSIBLY USING LIDOCAINE ETC   Destiny Brown 11/22/2018, 9:30 AM  _________________________________________________________________   (provider comments below)

## 2018-11-22 NOTE — Telephone Encounter (Signed)
Dr. Ollen BowlHarkins office faxed over no anesthesia will be used.

## 2018-11-22 NOTE — Telephone Encounter (Signed)
I called and lmtcb for Destiny Brown back from [facility] to verify if any anesthesia being used.

## 2018-11-22 NOTE — Telephone Encounter (Signed)
Procedure date is tomorrow.  She will not be able to hold Eliquis for the desired 3 days.  I will forward to [facility] for anticoagulation recommendations. Tereso NewcomerScott Alaze Garverick, PA-C    11/22/2018 2:44 PM

## 2018-11-23 NOTE — Telephone Encounter (Addendum)
SPOKE WITH PT AND PT AWARE OF INSTRUCTIONS AND CLEARANCE FAXED OVER TO OFFICE [phone]

## 2019-01-24 ENCOUNTER — Other Ambulatory Visit: Payer: Self-pay | Admitting: Family Medicine

## 2019-01-24 DIAGNOSIS — M810 Age-related osteoporosis without current pathological fracture: Secondary | ICD-10-CM

## 2019-01-24 DIAGNOSIS — Z780 Asymptomatic menopausal state: Secondary | ICD-10-CM

## 2019-03-29 ENCOUNTER — Other Ambulatory Visit: Payer: Self-pay | Admitting: Family Medicine

## 2019-03-29 DIAGNOSIS — Z1231 Encounter for screening mammogram for malignant neoplasm of breast: Secondary | ICD-10-CM

## 2019-04-10 ENCOUNTER — Other Ambulatory Visit: Payer: Medicare Other

## 2019-06-08 ENCOUNTER — Other Ambulatory Visit: Payer: Self-pay | Admitting: Neurosurgery

## 2019-06-08 ENCOUNTER — Other Ambulatory Visit: Payer: Self-pay | Admitting: Family Medicine

## 2019-06-08 DIAGNOSIS — M4727 Other spondylosis with radiculopathy, lumbosacral region: Secondary | ICD-10-CM

## 2019-06-08 DIAGNOSIS — Z13820 Encounter for screening for osteoporosis: Secondary | ICD-10-CM

## 2019-06-08 DIAGNOSIS — Z780 Asymptomatic menopausal state: Secondary | ICD-10-CM

## 2019-06-12 ENCOUNTER — Other Ambulatory Visit: Payer: Self-pay | Admitting: Family Medicine

## 2019-06-12 ENCOUNTER — Ambulatory Visit
Admission: RE | Admit: 2019-06-12 | Discharge: 2019-06-12 | Disposition: A | Discharge status: Home / Self Care | Payer: Medicare Other | Source: Ambulatory Visit | Attending: Family Medicine | Admitting: Family Medicine

## 2019-06-12 ENCOUNTER — Other Ambulatory Visit: Payer: Self-pay

## 2019-06-12 DIAGNOSIS — M79641 Pain in right hand: Secondary | ICD-10-CM

## 2019-06-12 DIAGNOSIS — M25519 Pain in unspecified shoulder: Secondary | ICD-10-CM

## 2019-06-12 DIAGNOSIS — Z1231 Encounter for screening mammogram for malignant neoplasm of breast: Secondary | ICD-10-CM

## 2019-06-12 DIAGNOSIS — Z13820 Encounter for screening for osteoporosis: Secondary | ICD-10-CM

## 2019-06-12 DIAGNOSIS — Z780 Asymptomatic menopausal state: Secondary | ICD-10-CM

## 2019-06-12 DIAGNOSIS — M79642 Pain in left hand: Secondary | ICD-10-CM

## 2019-06-15 ENCOUNTER — Other Ambulatory Visit: Payer: Self-pay

## 2019-06-15 ENCOUNTER — Ambulatory Visit
Admission: RE | Admit: 2019-06-15 | Discharge: 2019-06-15 | Disposition: A | Discharge status: Home / Self Care | Payer: Medicare Other | Source: Ambulatory Visit | Attending: Neurosurgery | Admitting: Neurosurgery

## 2019-06-15 DIAGNOSIS — M4727 Other spondylosis with radiculopathy, lumbosacral region: Secondary | ICD-10-CM

## 2019-08-08 ENCOUNTER — Telehealth: Payer: Self-pay

## 2019-08-08 NOTE — Telephone Encounter (Signed)
Please comment on eliquis. 

## 2019-08-08 NOTE — Telephone Encounter (Signed)
   Primary Cardiologist: Virl Axe, MD  Chart reviewed as part of pre-operative protocol coverage. Received request for recommendations on holding anticoagulation for procedure.   Per our clinical pharmacist: Patient with diagnosis of afib on Eliquis for anticoagulation.    Procedure: Lumbar MBB Date of procedure: 08/15/2019  CHADS2-VASc score of  4  (HTN, AGE, DM2, female)  CrCl 78ml/min  Per office protocol, patient can hold Eliquis for 3 days prior to procedure   I will route this recommendation to the requesting party via [facility] fax function and remove from pre-op pool.  Please call with questions.  [place], PA 08/08/2019, 3:58 PM

## 2019-08-08 NOTE — Telephone Encounter (Signed)
     [facility] Pre-operative Risk Assessment    Request for surgical clearance:  1. What type of surgery is being performed? Lumbar MBB   2. When is this surgery scheduled? 08-15-2019   3. What type of clearance is required (medical clearance vs. Pharmacy clearance to hold med vs. Both)? Pharmacy   4. Are there any medications that need to be held prior to surgery and how long?Eliquis   5. Practice name and name of physician performing surgery? [facility]   6. What is your office phone number [phone]    7.   What is your office fax number [phone]  8.   Anesthesia type (None, local, MAC, general) ? None indicated   Dollene Primrose 08/08/2019, 9:12 AM  _________________________________________________________________   (provider comments below)

## 2019-08-08 NOTE — Telephone Encounter (Signed)
Patient with diagnosis of afib on Eliquis for anticoagulation.    Procedure: Lumbar MBB  Date of procedure: 08/15/2019  CHADS2-VASc score of  4  (HTN, AGE, DM2, female)  CrCl 49ml/min  Per office protocol, patient can hold Eliquis for 3 days prior to procedure.

## 2019-08-08 NOTE — Telephone Encounter (Signed)
Informed pt of recommendation to hold Eliquis 3 days prior to injection. Pt verbalized understanding and thanks for the call.

## 2019-08-09 ENCOUNTER — Telehealth: Payer: Self-pay

## 2019-08-09 NOTE — Telephone Encounter (Signed)

## 2019-08-10 ENCOUNTER — Ambulatory Visit (INDEPENDENT_AMBULATORY_CARE_PROVIDER_SITE_OTHER): Payer: Medicare Other | Admitting: Student

## 2019-08-10 ENCOUNTER — Other Ambulatory Visit: Payer: Self-pay

## 2019-08-10 VITALS — BP 134/64 | HR 62 | Ht 65.0 in | Wt 200.0 lb

## 2019-08-10 DIAGNOSIS — I10 Essential (primary) hypertension: Secondary | ICD-10-CM | POA: Diagnosis not present

## 2019-08-10 DIAGNOSIS — I480 Paroxysmal atrial fibrillation: Secondary | ICD-10-CM

## 2019-08-10 DIAGNOSIS — I447 Left bundle-branch block, unspecified: Secondary | ICD-10-CM

## 2019-08-10 NOTE — Progress Notes (Signed)
PCP:  Buzzy Han, MD Primary Cardiologist: Virl Axe, MD Electrophysiologist: None   TAHANI Brown is a 73 y.o. female who presents today for routine electrophysiology followup. They are seen for Dr Caryl Comes.   Since last being seen in our clinic, the patient reports doing very well.  She denies symptoms of palpitations, chest pain, shortness of breath, orthopnea, PND, lower extremity edema, claudication, dizziness, presyncope, syncope, bleeding, or neurologic sequela. The patient is tolerating medications without difficulties.    The patient feels that she is tolerating medications without difficulties and is otherwise without complaint today.   Past Medical History:  Diagnosis Date  . Arthritis   . Atrial fibrillation with RVR ([facility]) 09/21/2014  . Bronchitis    h/o  . Diabetes mellitus    NO MEDS,  DIET CONTROLLED  . Headache(784.0)   . History of migraines    "when working under alot of stress"  . Hypertension   . Hypothyroid   . LBBB (left bundle branch block) 09/22/2014  . OSA (obstructive sleep apnea) 04/09/2015   Moderate OSA with AHI 16.7 events per hour with successful CPAP titration to 8cm H2O  . PONV (postoperative nausea and vomiting)    PT DOESNOT WANT TO TAKE HER ATENOLOL     DOS.   " IT DROPS MY BP TO LOW"   Past Surgical History:  Procedure Laterality Date  . ABDOMINAL HYSTERECTOMY  03/2001  . APPENDECTOMY    . BACK SURGERY    . CATARACT EXTRACTION, BILATERAL     WITH IMPLANTS  . CERVICAL FUSION  08/2003  . CHOLECYSTECTOMY  03/2010  . EYE SURGERY    . LUMBAR DISC SURGERY    . LUMBAR LAMINECTOMY/DECOMPRESSION MICRODISCECTOMY  10/2010  . POSTERIOR FUSION LUMBAR SPINE  12/16/11; 12/2005; 07/2004   L2-3; L3-4; L4-5  . TOTAL KNEE ARTHROPLASTY Right 03/18/2017   Procedure: RIGHT TOTAL KNEE ARTHROPLASTY;  Surgeon: Sydnee Cabal, MD;  Location: [facility];  Service: Orthopedics;  Laterality: Right;    Current Outpatient Medications  Medication Sig Dispense  Refill  . ACCU-CHEK FASTCLIX LANCETS MISC 1 each by Other route 2 (two) times daily. Use 1 lancet bid    . ACCU-CHEK SMARTVIEW test strip 1 strip by Other route 2 (two) times daily. Use 1 strip to check glucose twice a day    . amLODipine (NORVASC) 5 MG tablet Take 5 mg by mouth daily.    Marland Kitchen apixaban (ELIQUIS) 5 MG TABS tablet Take 1 tablet (5 mg total) by mouth 2 (two) times daily. 180 tablet 1  . Cholecalciferol (VITAMIN D3) 1000 UNITS CAPS Take 1,000 Units by mouth daily.      . hydrochlorothiazide (HYDRODIURIL) 25 MG tablet Take 25 mg by mouth daily.    Marland Kitchen levothyroxine (SYNTHROID, LEVOTHROID) 125 MCG tablet Take 125 mcg by mouth daily.     Marland Kitchen losartan (COZAAR) 100 MG tablet Take 100 mg by mouth daily.    . metFORMIN (GLUCOPHAGE) 500 MG tablet Take 500-1,000 mg by mouth 2 (two) times daily. 500 mg in the morning & 1000 mg in the evening     No current facility-administered medications for this visit.     Allergies  Allergen Reactions  . Codeine Other (See Comments)    Severe Headaches with codeine; but not with other opioids  . Percocet [Oxycodone-Acetaminophen] Other (See Comments)    hallucinations    Social History   Socioeconomic History  . Marital status: Married    Spouse name: Not on file  .  Number of children: 2  . Years of education: 1913  . Highest education level: Not on file  Occupational History  . Occupation: part [employer]  . Occupation: retired  Engineer, productionocial Needs  . Financial resource strain: Not on file  . Food insecurity    Worry: Not on file    Inability: Not on file  . Transportation needs    Medical: Not on file    Non-medical: Not on file  Tobacco Use  . Smoking status: Never Smoker  . Smokeless tobacco: Never Used  Substance and Sexual Activity  . Alcohol use: No  . Drug use: No  . Sexual activity: Never  Lifestyle  . Physical activity    Days per week: Not on file    Minutes per session: Not on file  . Stress: Not on file   Relationships  . Social Musicianconnections    Talks on phone: Not on file    Gets together: Not on file    Attends religious service: Not on file    Active member of club or organization: Not on file    Attends meetings of clubs or organizations: Not on file    Relationship status: Not on file  . Intimate partner violence    Fear of current or ex partner: Not on file    Emotionally abused: Not on file    Physically abused: Not on file    Forced sexual activity: Not on file  Other Topics Concern  . Not on file  Social History Narrative   Patient drinks about 2-3 cups of caffeine daily.   Patient is right handed.      Review of Systems: General: No chills, fever, night sweats or weight changes  Cardiovascular:  No chest pain, dyspnea on exertion, edema, orthopnea, palpitations, paroxysmal nocturnal dyspnea Dermatological: No rash, lesions or masses Respiratory: No cough, dyspnea Urologic: No hematuria, dysuria Abdominal: No nausea, vomiting, diarrhea, bright red blood per rectum, melena, or hematemesis Neurologic: No visual changes, weakness, changes in mental status All other systems reviewed and are otherwise negative except as noted above.  Physical Exam: Vitals:   08/10/19 1142  BP: 134/64  Pulse: 62  Weight: 200 lb (90.7 kg)  Height: 5\' 5"  (1.651 m)    GEN- The patient is well appearing, alert and oriented x 3 today.   HEENT: normocephalic, atraumatic; sclera clear, conjunctiva pink; hearing intact; oropharynx clear; neck supple, no JVP Lymph- no cervical lymphadenopathy Lungs- Clear to ausculation bilaterally, normal work of breathing.  No wheezes, rales, rhonchi Heart- Regular rate and rhythm, no murmurs, rubs or gallops, PMI not laterally displaced GI- soft, non-tender, non-distended, bowel sounds present, no hepatosplenomegaly Extremities- no clubbing, cyanosis, or edema; DP/PT/radial pulses 2+ bilaterally MS- no significant deformity or atrophy Skin- warm and dry, no  rash or lesion Psych- euthymic mood, full affect Neuro- strength and sensation are intact  EKG is ordered today. Personal review shows NSR at 62 bpm. LBBB 138 ms. ? Short PR interval. Appears to be 100-120 ms in lead II vs lead V, but p waves are difficult to discern (Low amplitude also seen on EKG 02/2018 and [employer] strips)  Assessment and Plan: 1. Paroxysmal Afib No recurrence that she is aware of. She is tolerating appropriately dosed Eliquis without bleeding. She denies palpitations.  2. LBBB Stable  3. HTN Continue current medications  Disposition: Follow up with Dr. Graciela HusbandsKlein in 1 year. Sooner with symptoms.   Graciella FreerMichael Andrew Tillery, PA-C  08/10/19 12:13 PM

## 2019-11-29 ENCOUNTER — Ambulatory Visit
Admission: RE | Admit: 2019-11-29 | Discharge: 2019-11-29 | Disposition: A | Discharge status: Home / Self Care | Payer: Medicare Other | Source: Ambulatory Visit | Attending: Family Medicine | Admitting: Family Medicine

## 2019-11-29 ENCOUNTER — Other Ambulatory Visit: Payer: Self-pay | Admitting: Family Medicine

## 2019-11-29 DIAGNOSIS — R05 Cough: Secondary | ICD-10-CM

## 2019-11-29 DIAGNOSIS — R059 Cough, unspecified: Secondary | ICD-10-CM

## 2020-05-02 ENCOUNTER — Other Ambulatory Visit: Payer: Self-pay | Admitting: Endocrinology

## 2020-05-02 DIAGNOSIS — Z1231 Encounter for screening mammogram for malignant neoplasm of breast: Secondary | ICD-10-CM

## 2020-05-31 ENCOUNTER — Telehealth: Payer: Self-pay

## 2020-05-31 NOTE — Telephone Encounter (Signed)
   [facility] Pre-operative Risk Assessment    HEARTCARE STAFF: - Please ensure there is not already an duplicate clearance open for this procedure. - Under Visit Info/Reason for Call, type in Other and utilize the format Clearance MM/DD/YY or Clearance TBD. Do not use dashes or single digits. - If request is for dental extraction, please clarify the # of teeth to be extracted.  Request for surgical clearance:  1. What type of surgery is being performed? PERMANENT PLACEMENT OF SPINAL CORD STIMULATOR    2. When is this surgery scheduled? 06/21/20   3. What type of clearance is required (medical clearance vs. Pharmacy clearance to hold med vs. Both)? BOTH  4. Are there any medications that need to be held prior to surgery and how long? ELIQUIS PLEASE ADVISE   5. Practice name and name of physician performing surgery?  [facility]; DR. Eddie Dibbles HARKINS  6. What is the office phone number? [phone]   7.   What is the office fax number? [phone]  8.   Anesthesia type (None, local, MAC, general) ? NONE LISTED   Jacinta Shoe 05/31/2020, 3:12 PM  _________________________________________________________________   (provider comments below)

## 2020-05-31 NOTE — Telephone Encounter (Signed)
   Primary Cardiologist:Steven Graciela Husbands, MD  Chart reviewed as part of pre-operative protocol coverage. Because of Destiny Brown's past medical history and time since last visit, they will require a follow-up visit in order to better assess preoperative cardiovascular risk.  Pre-op covering staff: - Please schedule appointment and call patient to inform them. If patient already had an upcoming appointment within acceptable timeframe, please add "pre-op clearance" to the appointment notes so provider is aware. - Please contact requesting surgeon's office via preferred method (i.e, phone, fax) to inform them of need for appointment prior to surgery.  If applicable, this message will also be routed to pharmacy pool and/or primary cardiologist for input on holding anticoagulant/antiplatelet agent as requested below so that this information is available to the clearing provider at time of patient's appointment.   [place], [place]  05/31/2020, 5:43 PM

## 2020-05-31 NOTE — Telephone Encounter (Signed)
Clinical pharmacist to review Eliquis 

## 2020-05-31 NOTE — Telephone Encounter (Signed)
Patient with diagnosis of ATRIAL FIBRILLATION on ELIQUIS for anticoagulation.    Procedure: SPINAL CORD STIMULATOR Date of procedure: 06/21/2020  CHADS2-VASc score of  5 (CHF, HTN, AGE x 2, DM2)  CrCl = 61 ml/min Platelet count = 244  Per office protocol, patient can hold ELIQUIS for 3 days prior to procedure.

## 2020-06-03 NOTE — Telephone Encounter (Signed)
Called and spoke to the patient. She states that she cannot come in for an appointment next week. She has been scheduled with Joni Reining, NP on 6/21 for pre-op clearance. Called left a message at Dr. Ollen Bowl office letting them know of clearance appointment. Instructed to call back with any questions.

## 2020-06-08 ENCOUNTER — Other Ambulatory Visit: Payer: Self-pay

## 2020-06-08 ENCOUNTER — Ambulatory Visit (HOSPITAL_COMMUNITY)
Admission: EM | Admit: 2020-06-08 | Discharge: 2020-06-08 | Disposition: A | Discharge status: Home / Self Care | Payer: Medicare Other | Attending: Physician Assistant | Admitting: Physician Assistant

## 2020-06-08 ENCOUNTER — Encounter (HOSPITAL_COMMUNITY): Payer: Self-pay

## 2020-06-08 DIAGNOSIS — S51812A Laceration without foreign body of left forearm, initial encounter: Secondary | ICD-10-CM | POA: Diagnosis not present

## 2020-06-08 MED ORDER — MUPIROCIN 2 % EX OINT: 1.0000 "application " | TOPICAL_OINTMENT | Freq: Two times a day (BID) | CUTANEOUS | 1 refills | Status: DC

## 2020-06-08 NOTE — Discharge Instructions (Addendum)
Change dressing twice a day. Use non-adherent dressing and Coban self-adherent wrap.

## 2020-06-08 NOTE — ED Provider Notes (Signed)
[facility]    CSN: [id number] Arrival date & time: 06/08/20  1328      History   Chief Complaint Chief Complaint  Patient presents with  . skin tear    HPI Destiny Brown is a 74 y.o. female.   Patient here concerned with skin tear L forearm x 6 days ago.  She was walking through a doorway when the door closed on her, causing the tear.  PMH DM, a fib on eliquis.  She has been using H2O2 and neosporin on her arm twice daily, however, she is concerned it continues to bleed and is not healing.  Denies f/c, pain, tenderness, erythema.       Past Medical History:  Diagnosis Date  . Arthritis   . Atrial fibrillation with RVR ([facility]) 09/21/2014  . Bronchitis    h/o  . Diabetes mellitus    NO MEDS,  DIET CONTROLLED  . Headache(784.0)   . History of migraines    "when working under alot of stress"  . Hypertension   . Hypothyroid   . LBBB (left bundle branch block) 09/22/2014  . OSA (obstructive sleep apnea) 04/09/2015   Moderate OSA with AHI 16.7 events per hour with successful CPAP titration to 8cm H2O  . PONV (postoperative nausea and vomiting)    PT DOESNOT WANT TO TAKE HER ATENOLOL     DOS.   " IT DROPS MY BP TO LOW"    Patient Active Problem List   Diagnosis Date Noted  . Paroxysmal A-fib ([facility]) 08/10/2019  . 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 Diplopia 01/20/2016  . Obesity (BMI 30-39.9) 06/26/2015  . OSA (obstructive sleep apnea) 04/09/2015  . LBBB (left bundle branch block) 09/22/2014  . Elevated troponin 09/22/2014  . Diabetes type 2, controlled ([facility]) 09/21/2014  . Migraine headache 09/21/2014  . Hypothyroid   . Hypertension   . 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 ABDOMINAL HYSTERECTOMY  03/2001  . APPENDECTOMY    . BACK SURGERY    . CATARACT EXTRACTION, BILATERAL     WITH IMPLANTS  . CERVICAL FUSION  08/2003  . CHOLECYSTECTOMY  03/2010  . EYE SURGERY    . LUMBAR DISC  SURGERY    . LUMBAR LAMINECTOMY/DECOMPRESSION MICRODISCECTOMY  10/2010  . POSTERIOR FUSION LUMBAR SPINE  12/16/11; 12/2005; 07/2004   L2-3; L3-4; L4-5  . TOTAL KNEE ARTHROPLASTY Right 03/18/2017   Procedure: RIGHT TOTAL KNEE ARTHROPLASTY;  Surgeon: Eugenia Mcalpine, MD;  Location: [facility];  Service: Orthopedics;  Laterality: Right;    OB History   No obstetric history on file.      Home Medications    Prior to Admission medications   Medication Sig Start Date End Date Taking? Authorizing Provider  ACCU-CHEK FASTCLIX LANCETS MISC 1 each by Other route 2 (two) times daily. Use 1 lancet bid 10/21/15   [provider]  ACCU-CHEK SMARTVIEW test strip 1 strip by Other route 2 (two) times daily. Use 1 strip to check glucose twice a day 10/21/15   [provider]  amLODipine (NORVASC) 5 MG tablet Take 5 mg by mouth daily. 12/14/16   [provider]  apixaban (ELIQUIS) 5 MG TABS tablet Take 1 tablet (5 mg total) by mouth 2 (two) times daily. 01/03/15   Duke Salvia, MD  Cholecalciferol (VITAMIN D3) 1000 UNITS CAPS Take 1,000 Units by mouth daily.      [provider]  hydrochlorothiazide (HYDRODIURIL) 25 MG tablet Take 25 mg by mouth daily.    [provider]  levothyroxine (SYNTHROID, LEVOTHROID) 125 MCG tablet Take 125 mcg by mouth daily.     [provider]  losartan (COZAAR) 100 MG tablet Take 100 mg by mouth daily.    [provider]  metFORMIN (GLUCOPHAGE) 500 MG tablet Take 500-1,000 mg by mouth 2 (two) times daily. 500 mg in the morning & 1000 mg in the evening    [provider]  mupirocin ointment (BACTROBAN) 2 % Apply 1 application topically 2 (two) times daily. 06/08/20   Peri Jefferson, PA-C    Family History Family History  Adopted: Yes  Family history unknown: Yes    Social History Social History   Tobacco Use  . Smoking status: Never Smoker  . Smokeless tobacco: Never Used  Substance Use Topics  .  Alcohol use: No  . Drug use: No     Allergies   Codeine and Percocet [oxycodone-acetaminophen]   Review of Systems Review of Systems  Constitutional: Negative for chills, fatigue and fever.  Gastrointestinal: Negative for abdominal pain, nausea and vomiting.  Musculoskeletal: Negative for arthralgias, back pain and myalgias.  Skin: Positive for wound. Negative for color change and rash.  Neurological: Negative for weakness, numbness and headaches.  Hematological: Negative for adenopathy. Does not bruise/bleed easily.  Psychiatric/Behavioral: Negative for confusion and sleep disturbance.  All other systems reviewed and are negative.    Physical Exam Triage Vital Signs ED Triage Vitals  Enc Vitals Group     BP 06/08/20 1355 136/60     Pulse Rate 06/08/20 1355 66     Resp 06/08/20 1355 12     Temp 06/08/20 1355 98.4 F (36.9 C)     Temp Source 06/08/20 1355 Oral     SpO2 06/08/20 1355 100 %     Weight --      Height --      Head Circumference --      Peak Flow --      Pain Score 06/08/20 1354 5     Pain Loc --      Pain Edu? --      Excl. in [place]? --    No data found.  Updated Vital Signs BP 136/60 (BP Location: Right Arm)   Pulse 66   Temp 98.4 F (36.9 C) (Oral)   Resp 12   SpO2 100%   Visual Acuity Right Eye Distance:   Left Eye Distance:   Bilateral Distance:    Right Eye Near:   Left Eye Near:    Bilateral Near:     Physical Exam Vitals and nursing note reviewed.  Constitutional:      General: She is not in acute distress.    Appearance: She is well-developed.  HENT:     Head: Normocephalic and atraumatic.  Eyes:     General: No scleral icterus.    Conjunctiva/sclera: Conjunctivae normal.  Cardiovascular:     Heart sounds: No murmur heard.   Pulmonary:     Effort: Pulmonary effort is normal. No respiratory distress.  Musculoskeletal:        General: No swelling, tenderness or deformity. Normal range of motion.     Cervical back: Normal  range of motion and neck supple. No rigidity.  Skin:    General: Skin is warm and dry.     Capillary Refill: Capillary refill takes less than 2 seconds.     Findings: Wound present.  Neurological:     General: No focal deficit present.     Mental Status: She is alert and oriented to person, place, and time.  Psychiatric:        Mood and Affect: Mood normal.        Behavior: Behavior normal.      UC Treatments / Results  Labs (all labs ordered are listed, but only abnormal results are displayed) Labs Reviewed - No data to display  EKG   Radiology No results found.  Procedures Procedures (including critical care time)  Medications Ordered in UC Medications - No data to display  Initial Impression / Assessment and Plan / UC Course  I have reviewed the triage vital signs and the nursing notes.  Pertinent labs & imaging results that were available during my care of the patient were reviewed by me and considered in my medical decision making (see chart for details).     Discontinue H2O2 Change dressing twice daily, use non-adherent dressing and coban. Wash with mild soapy warm water.  Follow up with PCP   Final Clinical Impressions(s) / UC Diagnoses   Final diagnoses:  Skin tear of forearm without complication, left, initial encounter     Discharge Instructions     Change dressing twice a day. Use non-adherent dressing and Coban self-adherent wrap.    ED Prescriptions    Medication Sig Dispense Auth. Provider   mupirocin ointment (BACTROBAN) 2 % Apply 1 application topically 2 (two) times daily. 30 g Evern Core, PA-C     PDMP not reviewed this encounter.   Evern Core, PA-C 06/08/20 1444

## 2020-06-08 NOTE — ED Triage Notes (Signed)
Patient reports that she was walking through a door frame last Sunday and the door caught her arm and caused a skin tear. Has been putting hydrogen peroxide on it daily.

## 2020-06-12 ENCOUNTER — Other Ambulatory Visit: Payer: Self-pay

## 2020-06-12 ENCOUNTER — Ambulatory Visit
Admission: RE | Admit: 2020-06-12 | Discharge: 2020-06-12 | Disposition: A | Discharge status: Home / Self Care | Payer: Medicare Other | Source: Ambulatory Visit | Attending: Endocrinology | Admitting: Endocrinology

## 2020-06-12 ENCOUNTER — Ambulatory Visit: Payer: Medicare Other

## 2020-06-12 DIAGNOSIS — Z1231 Encounter for screening mammogram for malignant neoplasm of breast: Secondary | ICD-10-CM

## 2020-06-13 NOTE — Progress Notes (Signed)
Cardiology Office Note   Date:  06/17/2020   ID:  Wynema, Garoutte Nov 03, 1946, MRN [id number]  PCP:  Reynold Bowen, MD  Cardiologist:  Dr. Caryl Comes  No chief complaint on file.    History of Present Illness: Destiny Brown is a 74 y.o. female who presents for ongoing assessment and management of LBBB, PAF on Eliquis, hypertension, negative myoview for ischemia. She has not been seen in the office since 2019. She is to have a spinal cord stimulator placed on date to be determined. She is here for pre-operative cardiology evaluation.   She has chronic back pain and has had several steroid injections.  She is due to have a back stimulator device placed in the lumbar spine by Dr. Maryjean Ka, orthopedic surgeon, at [facility].  Their phone number is [phone], fax [phone]. This will be completed on June 21, 2020.  She denies any chest pain, dyspnea on exertion, dizziness, neck pain, significant fatigue, or palpitations.  She continues to have chronic back pain which does limit her activities.  She states that she is tired of all the pain and is wanting to move forward and is hopeful being that the back stimulator would be helpful.  She states that she has had her Covid vaccines January and February ([place]) 2021.  Past Medical History:  Diagnosis Date  . Arthritis   . Atrial fibrillation with RVR ([place]) 09/21/2014  . Bronchitis    h/o  . Diabetes mellitus    NO MEDS,  DIET CONTROLLED  . Headache(784.0)   . History of migraines    "when working under alot of stress"  . Hypertension   . Hypothyroid   . LBBB (left bundle branch block) 09/22/2014  . OSA (obstructive sleep apnea) 04/09/2015   Moderate OSA with AHI 16.7 events per hour with successful CPAP titration to 8cm H2O  . PONV (postoperative nausea and vomiting)    PT DOESNOT WANT TO TAKE HER ATENOLOL     DOS.   " IT DROPS MY BP TO LOW"    Past Surgical History:  Procedure Laterality Date  .  ABDOMINAL HYSTERECTOMY  03/2001  . APPENDECTOMY    . BACK SURGERY    . CATARACT EXTRACTION, BILATERAL     WITH IMPLANTS  . CERVICAL FUSION  08/2003  . CHOLECYSTECTOMY  03/2010  . EYE SURGERY    . LUMBAR DISC SURGERY    . LUMBAR LAMINECTOMY/DECOMPRESSION MICRODISCECTOMY  10/2010  . POSTERIOR FUSION LUMBAR SPINE  12/16/11; 12/2005; 07/2004   L2-3; L3-4; L4-5  . TOTAL KNEE ARTHROPLASTY Right 03/18/2017   Procedure: RIGHT TOTAL KNEE ARTHROPLASTY;  Surgeon: Sydnee Cabal, MD;  Location: [facility];  Service: Orthopedics;  Laterality: Right;     Current Outpatient Medications  Medication Sig Dispense Refill  . ACCU-CHEK FASTCLIX LANCETS MISC 1 each by Other route 2 (two) times daily. Use 1 lancet bid    . ACCU-CHEK SMARTVIEW test strip 1 strip by Other route 2 (two) times daily. Use 1 strip to check glucose twice a day    . amLODipine (NORVASC) 5 MG tablet Take 5 mg by mouth daily.    Marland Kitchen apixaban (ELIQUIS) 5 MG TABS tablet Take 1 tablet (5 mg total) by mouth 2 (two) times daily. 180 tablet 1  . Cholecalciferol (VITAMIN D3) 1000 UNITS CAPS Take 1,000 Units by mouth daily.      . hydrochlorothiazide (HYDRODIURIL) 25 MG tablet Take 25 mg by mouth daily.    Marland Kitchen levothyroxine (  SYNTHROID, LEVOTHROID) 125 MCG tablet Take 125 mcg by mouth daily.     Marland Kitchen losartan (COZAAR) 100 MG tablet Take 100 mg by mouth daily.    . metFORMIN (GLUCOPHAGE) 500 MG tablet Take 500-1,000 mg by mouth 2 (two) times daily. 500 mg in the morning & 1000 mg in the evening    . mupirocin ointment (BACTROBAN) 2 % Apply 1 application topically 2 (two) times daily. 30 g 1   No current facility-administered medications for this visit.    Allergies:   Codeine and Percocet [oxycodone-acetaminophen]    Social History:  The patient  reports that she has never smoked. She has never used smokeless tobacco. She reports that she does not drink alcohol and does not use drugs.   Family History:  The patient's She was adopted. Family history is  unknown by patient.    ROS: All other systems are reviewed and negative. Unless otherwise mentioned in H&P    PHYSICAL EXAM: VS:  BP (!) 132/58   Pulse 67   Ht _0  (1.626 m)   Wt 196 lb 6.4 oz (89.1 kg)   SpO2 98%   BMI 33.71 kg/m  , BMI Body mass index is 33.71 kg/m. GEN: Well nourished, well developed, in no acute distress HEENT: normal Neck: no JVD, carotid bruits, or masses Cardiac: RRR; 1/6 systolic murmurs, rubs, or gallops,no edema  Respiratory:  Clear to auscultation bilaterally, normal work of breathing GI: soft, nontender, nondistended, + BS MS: no deformity or atrophy Skin: warm and dry, no rash.  Skin tear to her left proximal forearm.  Bandage in place.  (She has been seen by urgent care and has been given Bactroban ointment). Neuro:  Strength and sensation are intact Psych: euthymic mood, full affect   EKG: (Personally reviewed) normal sinus rhythm with occasional sinus arrhythmia, left axis deviation with left bundle branch block.  Heart rate of 67 (unchanged since prior EKG on 08/10/2019).   Recent Labs: No results found for requested labs within last 8760 hours.    Lipid Panel    Component Value Date/Time   CHOL 172 09/22/2014 0331   TRIG 118 09/22/2014 0331   HDL 57 09/22/2014 0331   CHOLHDL 3.0 09/22/2014 0331   VLDL 24 09/22/2014 0331   LDLCALC 91 09/22/2014 0331      Wt Readings from Last 3 Encounters:  06/17/20 196 lb 6.4 oz (89.1 kg)  08/10/19 200 lb (90.7 kg)  03/18/18 208 lb (94.3 kg)      Other studies Reviewed:  See scanned document echocardiogram 09/22/2014  ASSESSMENT AND PLAN:  1. Pre-Operative Cardiac Evaluation:  Chart reviewed as part of pre-operative protocol coverage. Given past medical history and time since last visit, based on ACC/AHA guidelines, Destiny Brown would be at acceptable risk for the planned procedure without further cardiovascular testing.  Okay to hold Eliquis for 3 days prior to procedure.  Will start  again that afternoon after procedures completed.  2.  Mitral valve murmur: Very soft.  She has not had an echocardiogram since 2015, would like to follow-up and have one on her next appointment.  3.  Paroxysmal atrial fibrillation: She remains on Eliquis 5 mg daily.  We will check a be met today to evaluate kidney function.  As above can hold it for 3 days prior to back stimulator insertion.  4.  Hypertension: Currently well controlled.  Continue amlodipine and losartan as directed along with hydrochlorothiazide.  Checking BMET today.    Current  medicines are reviewed at length with the patient today.  I have spent 25 minutes dedicated to the care of this patient on the date of this encounter to include pre-visit review of records, assessment, management and diagnostic testing,with shared decision making.  Labs/ tests ordered today include: BMET Phill Myron. West Pugh, ANP, [facility]   06/17/2020 10:41 AM    [facility] [address] Office [phone] Fax [phone]  Notice: This dictation was prepared with Dragon dictation along with smaller phrase technology. Any transcriptional errors that result from this process are unintentional and may not be corrected upon review.

## 2020-06-17 ENCOUNTER — Ambulatory Visit: Payer: Medicare Other | Admitting: Adult Health

## 2020-06-17 ENCOUNTER — Other Ambulatory Visit: Payer: Self-pay

## 2020-06-17 ENCOUNTER — Encounter: Payer: Self-pay | Admitting: Adult Health

## 2020-06-17 VITALS — BP 132/58 | HR 67 | Ht 64.0 in | Wt 196.4 lb

## 2020-06-17 DIAGNOSIS — Z79899 Other long term (current) drug therapy: Secondary | ICD-10-CM | POA: Diagnosis not present

## 2020-06-17 DIAGNOSIS — Z01810 Encounter for preprocedural cardiovascular examination: Secondary | ICD-10-CM | POA: Diagnosis not present

## 2020-06-17 DIAGNOSIS — I10 Essential (primary) hypertension: Secondary | ICD-10-CM

## 2020-06-17 DIAGNOSIS — I447 Left bundle-branch block, unspecified: Secondary | ICD-10-CM

## 2020-06-17 DIAGNOSIS — I480 Paroxysmal atrial fibrillation: Secondary | ICD-10-CM

## 2020-06-17 DIAGNOSIS — E669 Obesity, unspecified: Secondary | ICD-10-CM

## 2020-06-17 LAB — BASIC METABOLIC PANEL
BUN/Creatinine Ratio: 14 (ref 12–28)
BUN: 15 mg/dL (ref 8–27)
CO2: 20 mmol/L (ref 20–29)
Calcium: 9.9 mg/dL (ref 8.7–10.3)
Chloride: 98 mmol/L (ref 96–106)
Creatinine, Ser: 1.07 mg/dL — ABNORMAL HIGH (ref 0.57–1.00)
GFR calc Af Amer: 59 mL/min/{1.73_m2} — ABNORMAL LOW (ref >59)
GFR calc non Af Amer: 51 mL/min/{1.73_m2} — ABNORMAL LOW (ref >59)
Glucose: 131 mg/dL — ABNORMAL HIGH (ref 65–99)
Potassium: 4.5 mmol/L (ref 3.5–5.2)
Sodium: 137 mmol/L (ref 134–144)

## 2020-06-17 NOTE — Patient Instructions (Signed)
Medication Instructions:  Continue current medications  *If you need a refill on your cardiac medications before your next appointment, please call your pharmacy*   Lab Work: BMP Today  If you have labs (blood work) drawn today and your tests are completely normal, you will receive your results only by: Marland Kitchen MyChart Message (if you have MyChart) OR . A paper copy in the mail If you have any lab test that is abnormal or we need to change your treatment, we will call you to review the results.   Testing/Procedures: None Ordered3   Follow-Up: At [facility], you and your health needs are our priority.  As part of our continuing mission to provide you with exceptional heart care, we have created designated Provider Care Teams.  These Care Teams include your primary Cardiologist (physician) and Advanced Practice Providers (APPs -  Physician Assistants and Nurse Practitioners) who all work together to provide you with the care you need, when you need it.  We recommend signing up for the patient portal called "MyChart".  Sign up information is provided on this After Visit Summary.  MyChart is used to connect with patients for Virtual Visits (Telemedicine).  Patients are able to view lab/test results, encounter notes, upcoming appointments, etc.  Non-urgent messages can be sent to your provider as well.   To learn more about what you can do with MyChart, go to [email or web address].    Your next appointment:   6 month(s)  The format for your next appointment:   In Person  Provider:   You may see Sherryl Manges, MD or one of the following Advanced Practice Providers on your designated Care Team:    Norma Fredrickson, NP  Nada Boozer, NP  Georgie Chard, NP

## 2020-06-30 ENCOUNTER — Ambulatory Visit (HOSPITAL_COMMUNITY)
Admission: EM | Admit: 2020-06-30 | Discharge: 2020-06-30 | Disposition: A | Discharge status: Home / Self Care | Payer: Medicare Other | Attending: Family Medicine | Admitting: Family Medicine

## 2020-06-30 ENCOUNTER — Ambulatory Visit (INDEPENDENT_AMBULATORY_CARE_PROVIDER_SITE_OTHER): Payer: Medicare Other

## 2020-06-30 ENCOUNTER — Encounter (HOSPITAL_COMMUNITY): Payer: Self-pay

## 2020-06-30 ENCOUNTER — Other Ambulatory Visit: Payer: Self-pay

## 2020-06-30 DIAGNOSIS — R05 Cough: Secondary | ICD-10-CM

## 2020-06-30 DIAGNOSIS — R509 Fever, unspecified: Secondary | ICD-10-CM | POA: Diagnosis not present

## 2020-06-30 DIAGNOSIS — Z9682 Presence of neurostimulator: Secondary | ICD-10-CM | POA: Diagnosis not present

## 2020-06-30 DIAGNOSIS — J22 Unspecified acute lower respiratory infection: Secondary | ICD-10-CM | POA: Diagnosis not present

## 2020-06-30 MED ORDER — BENZONATATE 100 MG PO CAPS
ORAL_CAPSULE | ORAL | 0 refills | Status: DC
Start: 2020-06-30 — End: 2021-03-24

## 2020-06-30 MED ORDER — AZITHROMYCIN 250 MG PO TABS
ORAL_TABLET | ORAL | 0 refills | Status: DC
Start: 2020-06-30 — End: 2021-03-24

## 2020-06-30 NOTE — ED Provider Notes (Signed)
[facility]    CSN: [id number] Arrival date & time: 06/30/20  1016      History   Chief Complaint Chief Complaint  Patient presents with  . Cough    HPI Destiny Brown is a 74 y.o. female.   HPI   Patient states she has a past history of asthma and bronchitis but does not carry any diagnosis of ongoing lung disease.  Never smoker. She has chronic low back pain and on 06/21/2020 she had a nerve stimulating implant placed in her lumbar region She states that ever since she was discharged from the hospital she had a cough.  She cannot stop coughing.  She has been taking cough medicine at home, drinking fluids, and using cough lozenges.  Her chest is mildly sore from the coughing.  No shortness of breath.  She has tried asthma inhaler and it does not help with her cough.  No sputum production.  No fatigue or fever She states she has had pneumonia in the past and this feels similar She is worried because of her recent surgery and thinks that something "might have gone down the wrong pipe" She has had Covid vaccinations  Past Medical History:  Diagnosis Date  . Arthritis   . Atrial fibrillation with RVR ([facility]) 09/21/2014  . Bronchitis    h/o  . Diabetes mellitus    NO MEDS,  DIET CONTROLLED  . Headache(784.0)   . History of migraines    "when working under alot of stress"  . Hypertension   . Hypothyroid   . LBBB (left bundle branch block) 09/22/2014  . OSA (obstructive sleep apnea) 04/09/2015   Moderate OSA with AHI 16.7 events per hour with successful CPAP titration to 8cm H2O  . PONV (postoperative nausea and vomiting)    PT DOESNOT WANT TO TAKE HER ATENOLOL     DOS.   " IT DROPS MY BP TO LOW"    Patient Active Problem List   Diagnosis Date Noted  . Paroxysmal A-fib ([facility]) 08/10/2019  . 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 Diplopia 01/20/2016  . Obesity (BMI 30-39.9) 06/26/2015  . OSA (obstructive sleep apnea) 04/09/2015  . LBBB (left bundle branch block)  09/22/2014  . Elevated troponin 09/22/2014  . Diabetes type 2, controlled ([facility]) 09/21/2014  . Migraine headache 09/21/2014  . Hypothyroid   . Hypertension   . 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 ABDOMINAL HYSTERECTOMY  03/2001  . APPENDECTOMY    . BACK SURGERY    . CATARACT EXTRACTION, BILATERAL     WITH IMPLANTS  . CERVICAL FUSION  08/2003  . CHOLECYSTECTOMY  03/2010  . EYE SURGERY    . LUMBAR DISC SURGERY    . LUMBAR LAMINECTOMY/DECOMPRESSION MICRODISCECTOMY  10/2010  . POSTERIOR FUSION LUMBAR SPINE  12/16/11; 12/2005; 07/2004   L2-3; L3-4; L4-5  . TOTAL KNEE ARTHROPLASTY Right 03/18/2017   Procedure: RIGHT TOTAL KNEE ARTHROPLASTY;  Surgeon: Eugenia Mcalpine, MD;  Location: [facility];  Service: Orthopedics;  Laterality: Right;    OB History   No obstetric history on file.      Home Medications    Prior to Admission medications   Medication Sig Start Date End Date Taking? Authorizing Provider  ACCU-CHEK FASTCLIX LANCETS MISC 1 each by Other route 2 (two) times daily. Use 1 lancet bid 10/21/15   [provider]  ACCU-CHEK SMARTVIEW test strip 1 strip by Other route 2 (  two) times daily. Use 1 strip to check glucose twice a day 10/21/15   [provider]  amLODipine (NORVASC) 5 MG tablet Take 5 mg by mouth daily. 12/14/16   [provider]  apixaban (ELIQUIS) 5 MG TABS tablet Take 1 tablet (5 mg total) by mouth 2 (two) times daily. 01/03/15   Duke Salvia, MD  azithromycin (ZITHROMAX Z-PAK) 250 MG tablet Take two pills today followed by one a day until gone 06/30/20   Eustace Moore, MD  benzonatate (TESSALON) 100 MG capsule Take one cap 2 - 3 times a day as needed cough 06/30/20   Eustace Moore, MD  Cholecalciferol (VITAMIN D3) 1000 UNITS CAPS Take 1,000 Units by mouth daily.      [provider]  hydrochlorothiazide (HYDRODIURIL) 25 MG tablet Take 25 mg by mouth daily.     [provider]  levothyroxine (SYNTHROID, LEVOTHROID) 125 MCG tablet Take 125 mcg by mouth daily.     [provider]  losartan (COZAAR) 100 MG tablet Take 100 mg by mouth daily.    [provider]  metFORMIN (GLUCOPHAGE) 500 MG tablet Take 500-1,000 mg by mouth 2 (two) times daily. 500 mg in the morning & 1000 mg in the evening    [provider]  mupirocin ointment (BACTROBAN) 2 % Apply 1 application topically 2 (two) times daily. 06/08/20   Evern Core, PA-C    Family History Family History  Adopted: Yes  Family history unknown: Yes    Social History Social History   Tobacco Use  . Smoking status: Never Smoker  . Smokeless tobacco: Never Used  Substance Use Topics  . Alcohol use: No  . Drug use: No     Allergies   Codeine and Percocet [oxycodone-acetaminophen]   Review of Systems Review of Systems See HPI  Physical Exam Triage Vital Signs ED Triage Vitals  Enc Vitals Group     BP 06/30/20 1052 (!) 166/77     Pulse Rate 06/30/20 1052 83     Resp 06/30/20 1052 20     Temp 06/30/20 1052 98.2 F (36.8 C)     Temp src --      SpO2 06/30/20 1052 99 %     Weight --      Height --      Head Circumference --      Peak Flow --      Pain Score 06/30/20 1051 0     Pain Loc --      Pain Edu? --      Excl. in GC? --    No data found.  Updated Vital Signs BP (!) 166/77   Pulse 83   Temp 98.2 F (36.8 C)   Resp 20   SpO2 99%     Physical Exam Constitutional:      General: She is not in acute distress.    Appearance: She is well-developed.     Comments: Pleasant.  Overweight.  No acute distress  HENT:     Head: Normocephalic and atraumatic.     Right Ear: Tympanic membrane and ear canal normal.     Left Ear: Tympanic membrane and ear canal normal.     Nose: Nose normal.     Mouth/Throat:     Mouth: Mucous membranes are moist.     Pharynx: No posterior oropharyngeal erythema.  Eyes:     Conjunctiva/sclera:  Conjunctivae normal.     Pupils: Pupils are equal, round, and reactive  to light.  Cardiovascular:     Rate and Rhythm: Normal rate and regular rhythm.     Heart sounds: Normal heart sounds.  Pulmonary:     Effort: Pulmonary effort is normal. No respiratory distress.     Breath sounds: Normal breath sounds. No wheezing, rhonchi or rales.  Musculoskeletal:        General: Normal range of motion.     Cervical back: Normal range of motion and neck supple.  Skin:    General: Skin is warm and dry.     Comments: Incisions are examined.  The midline incision of the lumbar spine is healing well.  Glue is in place.  There is a lower incision over the left upper buttocks.  It is surrounded by deep erythema and small vesicles.  Appears an allergic reaction.  Neurological:     General: No focal deficit present.     Mental Status: She is alert.  Psychiatric:        Mood and Affect: Mood normal.        Behavior: Behavior normal.      UC Treatments / Results  Labs (all labs ordered are listed, but only abnormal results are displayed) Labs Reviewed - No data to display  EKG   Radiology DG Chest 2 View  Result Date: 06/30/2020 CLINICAL DATA:  Cough, fever, spinal cord stimulator placed 06/21/2020 EXAM: CHEST - 2 VIEW COMPARISON:  11/29/2019 FINDINGS: The heart size and mediastinal contours are within normal limits. Both lungs are clear. Disc degenerative disease of the thoracic spine. IMPRESSION: No acute abnormality of the lungs. Electronically Signed   By: Lauralyn Primes M.D.   On: 06/30/2020 11:42    Procedures Procedures (including critical care time)  Medications Ordered in UC Medications - No data to display  Initial Impression / Assessment and Plan / UC Course  I have reviewed the triage vital signs and the nursing notes.  Pertinent labs & imaging results that were available during my care of the patient were reviewed by me and considered in my medical decision making (see chart for  details).     Likely bronchitis.  We discussed that most bronchitis is caused by viruses.  She is concerned regarding her recent surgery, and the fact that it has been more than 10 days of coughing.  Will cover her with an antibiotic.  Tessalon for cough.  She has follow-up scheduled for Thursday of this week Final Clinical Impressions(s) / UC Diagnoses   Final diagnoses:  LRTI (lower respiratory tract infection)     Discharge Instructions     Continue to drink plenty of water Z-Pak as prescribed Take Tessalon 2-3 times a day as needed for cough May use a DM product (Mucinex DM) in addition if needed Use a cortisone cream on the rash around your wound See your doctor next week in follow-up    ED Prescriptions    Medication Sig Dispense Auth. Provider   azithromycin (ZITHROMAX Z-PAK) 250 MG tablet Take two pills today followed by one a day until gone 6 tablet Eustace Moore, MD   benzonatate (TESSALON) 100 MG capsule Take one cap 2 - 3 times a day as needed cough 30 capsule Eustace Moore, MD     PDMP not reviewed this encounter.   Eustace Moore, MD 06/30/20 1259

## 2020-06-30 NOTE — ED Triage Notes (Signed)
Patient complains of cough since 6/25. Reports she went in for surgery and has had the cough ever since. States she has tried OTC cough drops and syrups. Has f/u with surgeon on Thursday.

## 2020-06-30 NOTE — Discharge Instructions (Addendum)
Continue to drink plenty of water Z-Pak as prescribed Take Tessalon 2-3 times a day as needed for cough May use a DM product (Mucinex DM) in addition if needed Use a cortisone cream on the rash around your wound See your doctor next week in follow-up

## 2020-11-13 ENCOUNTER — Other Ambulatory Visit: Payer: Self-pay

## 2020-11-13 ENCOUNTER — Encounter (HOSPITAL_COMMUNITY): Payer: Self-pay

## 2020-11-13 ENCOUNTER — Ambulatory Visit (HOSPITAL_COMMUNITY)
Admission: EM | Admit: 2020-11-13 | Discharge: 2020-11-13 | Disposition: A | Discharge status: Home / Self Care | Payer: Medicare Other | Attending: Family Medicine | Admitting: Family Medicine

## 2020-11-13 DIAGNOSIS — I10 Essential (primary) hypertension: Secondary | ICD-10-CM

## 2020-11-13 DIAGNOSIS — M25531 Pain in right wrist: Secondary | ICD-10-CM | POA: Diagnosis not present

## 2020-11-13 MED ORDER — PREDNISONE 20 MG PO TABS
40.0000 mg | ORAL_TABLET | Freq: Every day | ORAL | 0 refills | Status: DC
Start: 2020-11-13 — End: 2020-11-29

## 2020-11-13 NOTE — ED Triage Notes (Addendum)
Pt c/o pain to right hand s/p "pulling" wet cement with a hoe last Friday. Specifies pain to anterior and posterior wrist to hand that increases with rotation.   Has applied ice and topical pain relievers, tylenol arthritis with minimal improvement.  +2 radial pulse, hand/wrist slightly warm/red. Had carpal tunnel surgery bilaterally approx 20 years ago.

## 2020-11-13 NOTE — Discharge Instructions (Addendum)
Monitor your blood sugars while taking prednisone as they will rise.  Your blood pressure was noted to be elevated during your visit today. If you are currently taking medication for high blood pressure, please ensure you are taking this as directed. If you do not have a history of high blood pressure and your blood pressure remains persistently elevated, you may need to begin taking a medication at some point. You may return here within the next few days to recheck if unable to see your primary care provider or if do not have a one.  BP (!) 173/116   Pulse 77   Temp 98 F (36.7 C) (Oral)   Resp 18   SpO2 100%

## 2020-11-16 NOTE — ED Provider Notes (Signed)
[facility]   [id number] 11/13/20 Arrival Time: 1645  ASSESSMENT & PLAN:  1. Right wrist pain   2. Elevated blood pressure reading in office with diagnosis of hypertension     No indication for wrist imaging.  Begin trial of : Meds ordered this encounter  Medications  . predniSONE (DELTASONE) 20 MG tablet    Sig: Take 2 tablets (40 mg total) by mouth daily.    Dispense:  10 tablet    Refill:  0    Orders Placed This Encounter  Procedures  . Apply Wrist brace    Recommend:  Follow-up Information    [facility].   Why: If worsening or failing to improve as anticipated. Contact information: [address] [place] [address] [phone]               Reviewed expectations re: course of current medical issues. Questions answered. Outlined signs and symptoms indicating need for more acute intervention. Patient verbalized understanding. After Visit Summary given.  SUBJECTIVE: History from: patient. Destiny Brown is a 75 y.o. female who reports fairly persistent moderate pain of her right hand/wrist after working with on wet cement with a hoe; no trauma. "Pulling hard then noticed pain." No extremity sensation changes or weakness. Ice and OTC analgesics without much relief. Pain worse with flex/ext.  Past Surgical History:  Procedure Laterality Date  . ABDOMINAL HYSTERECTOMY  03/2001  . APPENDECTOMY    . BACK SURGERY    . CATARACT EXTRACTION, BILATERAL     WITH IMPLANTS  . CERVICAL FUSION  08/2003  . CHOLECYSTECTOMY  03/2010  . EYE SURGERY    . LUMBAR DISC SURGERY    . LUMBAR LAMINECTOMY/DECOMPRESSION MICRODISCECTOMY  10/2010  . POSTERIOR FUSION LUMBAR SPINE  12/16/11; 12/2005; 07/2004   L2-3; L3-4; L4-5  . TOTAL KNEE ARTHROPLASTY Right 03/18/2017   Procedure: RIGHT TOTAL KNEE ARTHROPLASTY;  Surgeon: Eugenia Mcalpine, MD;  Location: [facility];  Service: Orthopedics;  Laterality: Right;       OBJECTIVE:  Vitals:   11/13/20 1820  BP: (!) 173/116  Pulse: 77  Resp: 18  Temp: 98 F (36.7 C)  TempSrc: Oral  SpO2: 100%    General appearance: alert; no distress HEENT: [place]; AT Neck: supple with FROM Resp: unlabored respirations Extremities: . RUE: warm with well perfused appearance; poorly localized moderate tenderness over right wrist mostly; no specific bony TTP; without gross deformities; swelling: minimal; bruising: none; wrist ROM: normal, with discomfort CV: brisk extremity capillary refill of RUE; 2+ radial pulse of RUE. Skin: warm and dry; no visible rashes Neurologic: gait normal; normal sensation and strength of RUE Psychological: alert and cooperative; normal mood and affect    Allergies  Allergen Reactions  . Codeine Other (See Comments)    Severe Headaches with codeine; but not with other opioids  . Percocet [Oxycodone-Acetaminophen] Other (See Comments)    hallucinations    Past Medical History:  Diagnosis Date  . Arthritis   . Atrial fibrillation with RVR (HCC) 09/21/2014  . Bronchitis    h/o  . Diabetes mellitus    NO MEDS,  DIET CONTROLLED  . Headache(784.0)   . History of migraines    "when working under alot of stress"  . Hypertension   . Hypothyroid   . LBBB (left bundle branch block) 09/22/2014  . OSA (obstructive sleep apnea) 04/09/2015   Moderate OSA with AHI 16.7 events per hour with successful CPAP titration to 8cm  H2O  . PONV (postoperative nausea and vomiting)    PT DOESNOT WANT TO TAKE HER ATENOLOL     DOS.   " IT DROPS MY BP TO LOW"   Social History   Socioeconomic History  . Marital status: Married    Spouse name: Not on file  . Number of children: 2  . Years of education: 14  . Highest education level: Not on file  Occupational History  . Occupation: part [employer]  . Occupation: retired  Tobacco Use  . Smoking status: Never Smoker  . Smokeless tobacco: Never Used  Substance and Sexual Activity  .  Alcohol use: No  . Drug use: No  . Sexual activity: Never  Other Topics Concern  . Not on file  Social History Narrative   Patient drinks about 2-3 cups of caffeine daily.   Patient is right handed.    Social Determinants of Health   Financial Resource Strain:   . Difficulty of Paying Living Expenses: Not on file  Food Insecurity:   . Worried About Programme researcher, broadcasting/film/video in the Last Year: Not on file  . Ran Out of Food in the Last Year: Not on file  Transportation Needs:   . Lack of Transportation (Medical): Not on file  . Lack of Transportation (Non-Medical): Not on file  Physical Activity:   . Days of Exercise per Week: Not on file  . Minutes of Exercise per Session: Not on file  Stress:   . Feeling of Stress : Not on file  Social Connections:   . Frequency of Communication with Friends and Family: Not on file  . Frequency of Social Gatherings with Friends and Family: Not on file  . Attends Religious Services: Not on file  . Active Member of Clubs or Organizations: Not on file  . Attends Banker Meetings: Not on file  . Marital Status: Not on file   Family History  Adopted: Yes  Family history unknown: Yes   Past Surgical History:  Procedure Laterality Date  . ABDOMINAL HYSTERECTOMY  03/2001  . APPENDECTOMY    . BACK SURGERY    . CATARACT EXTRACTION, BILATERAL     WITH IMPLANTS  . CERVICAL FUSION  08/2003  . CHOLECYSTECTOMY  03/2010  . EYE SURGERY    . LUMBAR DISC SURGERY    . LUMBAR LAMINECTOMY/DECOMPRESSION MICRODISCECTOMY  10/2010  . POSTERIOR FUSION LUMBAR SPINE  12/16/11; 12/2005; 07/2004   L2-3; L3-4; L4-5  . TOTAL KNEE ARTHROPLASTY Right 03/18/2017   Procedure: RIGHT TOTAL KNEE ARTHROPLASTY;  Surgeon: Eugenia Mcalpine, MD;  Location: [facility];  Service: Orthopedics;  Laterality: Right;      Mardella Layman, MD 11/16/20 [phone]

## 2020-11-29 ENCOUNTER — Other Ambulatory Visit: Payer: Self-pay

## 2020-11-29 ENCOUNTER — Encounter (HOSPITAL_COMMUNITY): Payer: Self-pay | Admitting: Emergency Medicine

## 2020-11-29 ENCOUNTER — Ambulatory Visit (HOSPITAL_COMMUNITY)
Admission: EM | Admit: 2020-11-29 | Discharge: 2020-11-29 | Disposition: A | Discharge status: Home / Self Care | Payer: Medicare Other | Attending: Family Medicine | Admitting: Family Medicine

## 2020-11-29 DIAGNOSIS — J069 Acute upper respiratory infection, unspecified: Secondary | ICD-10-CM | POA: Diagnosis not present

## 2020-11-29 DIAGNOSIS — J4521 Mild intermittent asthma with (acute) exacerbation: Secondary | ICD-10-CM | POA: Diagnosis not present

## 2020-11-29 DIAGNOSIS — I10 Essential (primary) hypertension: Secondary | ICD-10-CM | POA: Diagnosis not present

## 2020-11-29 DIAGNOSIS — Z20822 Contact with and (suspected) exposure to covid-19: Secondary | ICD-10-CM | POA: Insufficient documentation

## 2020-11-29 MED ORDER — PROMETHAZINE-DM 6.25-15 MG/5ML PO SYRP
5.0000 mL | ORAL_SOLUTION | Freq: Four times a day (QID) | ORAL | 0 refills | Status: DC | PRN
Start: 2020-11-29 — End: 2020-11-30

## 2020-11-29 MED ORDER — PREDNISONE 20 MG PO TABS
40.0000 mg | ORAL_TABLET | Freq: Every day | ORAL | 0 refills | Status: DC
Start: 2020-11-29 — End: 2020-11-30

## 2020-11-29 NOTE — ED Triage Notes (Signed)
Patient c/o non-productive cough x 10 days.   Patient denies fever at home.   Patient has taken OTC cough medication with no relief of symptoms.

## 2020-11-29 NOTE — ED Provider Notes (Signed)
[facility]    CSN: [id number] Arrival date & time: 11/29/20  1750      History   Chief Complaint Chief Complaint  Patient presents with  . Cough    HPI Destiny Brown is a 74 y.o. female.   Presenting today with 10 days of hacking cough of clear mucus. Denies congestion, fever, sore throat, CP, SOB, body aches. Had one day of wheezing that was easily improved with albuterol inhaler. Nephew has been sick, COVID negative. She has not yet been tested for resp viruses. Hx of asthma, allergic rhinitis. Taking cold medicine with mild temporary benefit.      Past Medical History:  Diagnosis Date  . Arthritis   . Atrial fibrillation with RVR (HCC) 09/21/2014  . Bronchitis    h/o  . Diabetes mellitus    NO MEDS,  DIET CONTROLLED  . Headache(784.0)   . History of migraines    "when working under alot of stress"  . Hypertension   . Hypothyroid   . LBBB (left bundle branch block) 09/22/2014  . OSA (obstructive sleep apnea) 04/09/2015   Moderate OSA with AHI 16.7 events per hour with successful CPAP titration to 8cm H2O  . PONV (postoperative nausea and vomiting)    PT DOESNOT WANT TO TAKE HER ATENOLOL     DOS.   " IT DROPS MY BP TO LOW"    Patient Active Problem List   Diagnosis Date Noted  . Paroxysmal A-fib (HCC) 08/10/2019  . 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 Diplopia 01/20/2016  . Obesity (BMI 30-39.9) 06/26/2015  . OSA (obstructive sleep apnea) 04/09/2015  . LBBB (left bundle branch block) 09/22/2014  . Elevated troponin 09/22/2014  . Diabetes type 2, controlled (HCC) 09/21/2014  . Migraine headache 09/21/2014  . Hypothyroid   . Hypertension   . 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 ABDOMINAL HYSTERECTOMY  03/2001  . APPENDECTOMY    . BACK SURGERY    . CATARACT EXTRACTION, BILATERAL     WITH IMPLANTS  . CERVICAL FUSION  08/2003  . CHOLECYSTECTOMY  03/2010  . EYE SURGERY    .  LUMBAR DISC SURGERY    . LUMBAR LAMINECTOMY/DECOMPRESSION MICRODISCECTOMY  10/2010  . POSTERIOR FUSION LUMBAR SPINE  12/16/11; 12/2005; 07/2004   L2-3; L3-4; L4-5  . TOTAL KNEE ARTHROPLASTY Right 03/18/2017   Procedure: RIGHT TOTAL KNEE ARTHROPLASTY;  Surgeon: Eugenia Mcalpine, MD;  Location: [facility];  Service: Orthopedics;  Laterality: Right;    OB History   No obstetric history on file.      Home Medications    Prior to Admission medications   Medication Sig Start Date End Date Taking? Authorizing Provider  amLODipine (NORVASC) 5 MG tablet Take 5 mg by mouth daily. 12/14/16  Yes [provider]  apixaban (ELIQUIS) 5 MG TABS tablet Take 1 tablet (5 mg total) by mouth 2 (two) times daily. 01/03/15  Yes Duke Salvia, MD  Cholecalciferol (VITAMIN D3) 1000 UNITS CAPS Take 1,000 Units by mouth daily.     Yes [provider]  hydrochlorothiazide (HYDRODIURIL) 25 MG tablet Take 25 mg by mouth daily.   Yes [provider]  levothyroxine (SYNTHROID, LEVOTHROID) 125 MCG tablet Take 125 mcg by mouth daily.    Yes [provider]  losartan (COZAAR) 100 MG tablet Take 100 mg by mouth daily.   Yes [provider]  metFORMIN (GLUCOPHAGE) 500 MG tablet Take  500-1,000 mg by mouth 2 (two) times daily. 500 mg in the morning & 1000 mg in the evening   Yes [provider]  ACCU-CHEK FASTCLIX LANCETS MISC 1 each by Other route 2 (two) times daily. Use 1 lancet bid 10/21/15   [provider]  ACCU-CHEK SMARTVIEW test strip 1 strip by Other route 2 (two) times daily. Use 1 strip to check glucose twice a day 10/21/15   [provider]  azithromycin (ZITHROMAX Z-PAK) 250 MG tablet Take two pills today followed by one a day until gone 06/30/20   Eustace Moore, MD  benzonatate (TESSALON) 100 MG capsule Take one cap 2 - 3 times a day as needed cough 06/30/20   Eustace Moore, MD  mupirocin ointment (BACTROBAN) 2 % Apply 1 application topically  2 (two) times daily. 06/08/20   Evern Core, PA-C  predniSONE (DELTASONE) 20 MG tablet Take 2 tablets (40 mg total) by mouth daily. 11/29/20   Particia Nearing, PA-C  promethazine-dextromethorphan (PROMETHAZINE-DM) 6.25-15 MG/5ML syrup Take 5 mLs by mouth 4 (four) times daily as needed for cough. 11/29/20   Particia Nearing, PA-C    Family History Family History  Adopted: Yes  Family history unknown: Yes    Social History Social History   Tobacco Use  . Smoking status: Never Smoker  . Smokeless tobacco: Never Used  Substance Use Topics  . Alcohol use: No  . Drug use: No     Allergies   Codeine and Percocet [oxycodone-acetaminophen]   Review of Systems Review of Systems PER HPI   Physical Exam Triage Vital Signs ED Triage Vitals  Enc Vitals Group     BP 11/29/20 1936 (!) 180/81     Pulse Rate 11/29/20 1936 79     Resp 11/29/20 1936 16     Temp 11/29/20 1936 98.4 F (36.9 C)     Temp Source 11/29/20 1936 Oral     SpO2 11/29/20 1936 99 %     Weight --      Height --      Head Circumference --      Peak Flow --      Pain Score 11/29/20 1937 0     Pain Loc --      Pain Edu? --      Excl. in GC? --    No data found.  Updated Vital Signs BP (!) 180/81 (BP Location: Left Arm)   Pulse 79   Temp 98.4 F (36.9 C) (Oral)   Resp 16   SpO2 99%   Visual Acuity Right Eye Distance:   Left Eye Distance:   Bilateral Distance:    Right Eye Near:   Left Eye Near:    Bilateral Near:     Physical Exam Vitals and nursing note reviewed.  Constitutional:      Appearance: Normal appearance. She is not ill-appearing.  HENT:     Head: Atraumatic.     Right Ear: Tympanic membrane normal.     Left Ear: Tympanic membrane normal.     Nose: Nose normal.     Mouth/Throat:     Mouth: Mucous membranes are moist.     Pharynx: Oropharynx is clear.  Eyes:     Extraocular Movements: Extraocular movements intact.     Conjunctiva/sclera: Conjunctivae normal.    Cardiovascular:     Rate and Rhythm: Normal rate and regular rhythm.     Heart sounds: Normal heart sounds.  Pulmonary:  Effort: Pulmonary effort is normal.     Breath sounds: Normal breath sounds. No wheezing or rales.  Abdominal:     General: Bowel sounds are normal. There is no distension.     Palpations: Abdomen is soft.     Tenderness: There is no abdominal tenderness. There is no guarding.  Musculoskeletal:        General: Normal range of motion.     Cervical back: Normal range of motion and neck supple.  Skin:    General: Skin is warm and dry.  Neurological:     Mental Status: She is alert and oriented to person, place, and time.  Psychiatric:        Mood and Affect: Mood normal.        Thought Content: Thought content normal.        Judgment: Judgment normal.      UC Treatments / Results  Labs (all labs ordered are listed, but only abnormal results are displayed) Labs Reviewed  SARS CORONAVIRUS 2 (TAT 6-24 HRS)    EKG   Radiology No results found.  Procedures Procedures (including critical care time)  Medications Ordered in UC Medications - No data to display  Initial Impression / Assessment and Plan / UC Course  I have reviewed the triage vital signs and the nursing notes.  Pertinent labs & imaging results that were available during my care of the patient were reviewed by me and considered in my medical decision making (see chart for details).     Vitals overall reassuring other than elevated BPs which she states is not typical for her other than when she is sick. Reiterated close checks on this and to f/u with PCP if not improving on home checks in next few days. Exam benign, no evidence of bacterial infection suspect inflammatory/allergic cause. Will cautiously treat with prednisone burst which she states her BSs usually tolerate well without reaching over 200 and continue albuterol, add phenergan DM for prn cough. Supportive care and return  precautions reviewed.  COVID pcr pending, isolation reviewed.  Final Clinical Impressions(s) / UC Diagnoses   Final diagnoses:  Viral URI with cough  Mild intermittent asthma with exacerbation  Essential hypertension   Discharge Instructions   None    ED Prescriptions    Medication Sig Dispense Auth. Provider   predniSONE (DELTASONE) 20 MG tablet Take 2 tablets (40 mg total) by mouth daily. 10 tablet Particia Nearing, [place]   promethazine-dextromethorphan (PROMETHAZINE-DM) 6.25-15 MG/5ML syrup Take 5 mLs by mouth 4 (four) times daily as needed for cough. 100 mL Particia Nearing, [place]     PDMP not reviewed this encounter.   Particia Nearing, [place] 11/29/20 2031

## 2020-11-30 ENCOUNTER — Telehealth (HOSPITAL_COMMUNITY): Payer: Self-pay | Admitting: *Deleted

## 2020-11-30 MED ORDER — PROMETHAZINE-DM 6.25-15 MG/5ML PO SYRP: 5.0000 mL | ORAL_SOLUTION | Freq: Four times a day (QID) | ORAL | 0 refills | Status: DC | PRN

## 2020-11-30 MED ORDER — PREDNISONE 20 MG PO TABS: 40.0000 mg | ORAL_TABLET | Freq: Every day | ORAL | 0 refills | Status: DC

## 2020-12-01 LAB — SARS CORONAVIRUS 2 (TAT 6-24 HRS): SARS Coronavirus 2: NEGATIVE

## 2020-12-06 ENCOUNTER — Ambulatory Visit: Payer: Medicare Other | Admitting: Internal Medicine

## 2021-03-24 ENCOUNTER — Telehealth: Payer: Self-pay | Admitting: Internal Medicine

## 2021-03-24 ENCOUNTER — Encounter: Payer: Self-pay | Admitting: Internal Medicine

## 2021-03-24 ENCOUNTER — Ambulatory Visit: Payer: Medicare Other | Admitting: Internal Medicine

## 2021-03-24 ENCOUNTER — Encounter: Payer: Self-pay | Admitting: *Deleted

## 2021-03-24 ENCOUNTER — Ambulatory Visit (INDEPENDENT_AMBULATORY_CARE_PROVIDER_SITE_OTHER): Payer: Medicare Other

## 2021-03-24 ENCOUNTER — Other Ambulatory Visit: Payer: Self-pay

## 2021-03-24 VITALS — BP 162/72 | HR 67 | Ht 64.0 in | Wt 187.0 lb

## 2021-03-24 DIAGNOSIS — I480 Paroxysmal atrial fibrillation: Secondary | ICD-10-CM

## 2021-03-24 DIAGNOSIS — R000 Tachycardia, unspecified: Secondary | ICD-10-CM

## 2021-03-24 DIAGNOSIS — I447 Left bundle-branch block, unspecified: Secondary | ICD-10-CM | POA: Diagnosis not present

## 2021-03-24 NOTE — Telephone Encounter (Signed)
Spoke with pt who states she had labs completed at Dr [employer] office 01/2021.  Pt states she will have his office fax lab results to Dr Graciela Husbands at [phone].

## 2021-03-24 NOTE — Patient Instructions (Addendum)
Medication Instructions:  Your physician recommends that you continue on your current medications as directed. Please refer to the Current Medication list given to you today.  *If you need a refill on your cardiac medications before your next appointment, please call your pharmacy*   Lab Work: None ordered.  Pt had labs completed 01/2021 at Dr Rinaldo Cloud office.  Pt will ask labs be faxed to Dr Graciela Husbands at [phone].  If you have labs (blood work) drawn today and your tests are completely normal, you will receive your results only by: Marland Kitchen MyChart Message (if you have MyChart) OR . A paper copy in the mail If you have any lab test that is abnormal or we need to change your treatment, we will call you to review the results.   Testing/Procedures: Christena Deem- Long Term Monitor Instructions   Your physician has requested you wear your ZIO patch monitor___14____days.   This is a single patch monitor.  [employer] supplies one patch monitor per enrollment.  Additional stickers are not available.   Please do not apply patch if you will be having a Nuclear Stress Test, Echocardiogram, Cardiac CT, MRI, or Chest Xray during the time frame you would be wearing the monitor. The patch cannot be worn during these tests.  You cannot remove and re-apply the ZIO XT patch monitor.   Your ZIO patch monitor will be sent USPS Priority mail from [facility] directly to your home address. The monitor may also be mailed to a PO BOX if home delivery is not available.   It may take 3-5 days to receive your monitor after you have been enrolled.   Once you have received you monitor, please review enclosed instructions.  Your monitor has already been registered assigning a specific monitor serial # to you.   Applying the monitor   Shave hair from upper left chest.   Hold abrader disc by orange tab.  Rub abrader in 40 strokes over left upper chest as indicated in your monitor instructions.   Clean area with 4  enclosed alcohol pads .  Use all pads to assure are is cleaned thoroughly.  Let dry.   Apply patch as indicated in monitor instructions.  Patch will be place under collarbone on left side of chest with arrow pointing upward.   Rub patch adhesive wings for 2 minutes.Remove white label marked "1".  Remove white label marked "2".  Rub patch adhesive wings for 2 additional minutes.   While looking in a mirror, press and release button in center of patch.  A small green light will flash 3-4 times .  This will be your only indicator the monitor has been turned on.     Do not shower for the first 24 hours.  You may shower after the first 24 hours.   Press button if you feel a symptom. You will hear a small click.  Record Date, Time and Symptom in the Patient Log Book.   When you are ready to remove patch, follow instructions on last 2 pages of Patient Log Book.  Stick patch monitor onto last page of Patient Log Book.   Place Patient Log Book in [place] box.  Use locking tab on box and tape box closed securely.  The [employer] has [employer] on it.  Please place in mailbox as soon as possible.  Your physician should have your test results approximately 7 days after the monitor has been mailed back to [facility].   Call [employer] Customer Care at [phone] if you have questions regarding your ZIO XT patch monitor.  Call them immediately if you see an orange light blinking on your monitor.   If your monitor falls off in less than 4 days contact our Monitor department at [phone].  If your monitor becomes loose or falls off after 4 days call [employer] at [phone] for suggestions on securing your monitor.      Follow-Up: At [facility], you and your health needs are our priority.  As part of our continuing mission to provide you with exceptional heart care, we have created designated Provider Care Teams.  These Care Teams include your primary Cardiologist (physician)  and Advanced Practice Providers (APPs -  Physician Assistants and Nurse Practitioners) who all work together to provide you with the care you need, when you need it.  We recommend signing up for the patient portal called "MyChart".  Sign up information is provided on this After Visit Summary.  MyChart is used to connect with patients for Virtual Visits (Telemedicine).  Patients are able to view lab/test results, encounter notes, upcoming appointments, etc.  Non-urgent messages can be sent to your provider as well.   To learn more about what you can do with MyChart, go to [email or web address].    Your next appointment:   6 month(s)  The format for your next appointment:   In Person  Provider:   Sherryl Manges, MD

## 2021-03-24 NOTE — Telephone Encounter (Signed)
Follow up:    Patient calling to ask for Destiny Brown did not see a not. Patient had a apt this morning. Please advise.

## 2021-03-24 NOTE — Progress Notes (Signed)
Patient ID: Destiny Brown, female   DOB: Jun 05, 1946, 75 y.o.   MRN: [id number] Patient enrolled for [employer] to ship a 14 day ZIO XT long term holter monitor to her home.

## 2021-03-24 NOTE — Progress Notes (Signed)
Patient Care Team: Adrian Prince, MD as PCP - General (Endocrinology) Duke Salvia, MD as PCP - Cardiology (Cardiology) Ernesto Rutherford, MD as Consulting Physician (Ophthalmology)   HPI  Destiny Brown is a 75 y.o. female Seen in followup for PAF presenting with RVR 9/15   She has LBBB.  HTN Dm  EF 55%;  myoview neg for ischemia  Intermittent increasingly frequent palpitations, typically lasting minutes associated with lightheadedness and some bilateral arm discomfort.  Denies exertional arm discomfort or dyspnea; under a great deal of stress with her husband who got some memory issues and 2 grandsons with whom they live for whom a care 1 of whom has modest functional autism   Thromboembolic risk factors ( age -24, HTN-1, DM-1, Gender-1) for a CHADSVASc Score of 4      Date Cr Hgb  3/18 1.19 11.4  6/21  1.07      Past Medical History:  Diagnosis Date  . Arthritis   . Atrial fibrillation with RVR (HCC) 09/21/2014  . Bronchitis    h/o  . Diabetes mellitus    NO MEDS,  DIET CONTROLLED  . Headache(784.0)   . History of migraines    "when working under alot of stress"  . Hypertension   . Hypothyroid   . LBBB (left bundle branch block) 09/22/2014  . OSA (obstructive sleep apnea) 04/09/2015   Moderate OSA with AHI 16.7 events per hour with successful CPAP titration to 8cm H2O  . PONV (postoperative nausea and vomiting)    PT DOESNOT WANT TO TAKE HER ATENOLOL     DOS.   " IT DROPS MY BP TO LOW"    Past Surgical History:  Procedure Laterality Date  . ABDOMINAL HYSTERECTOMY  03/2001  . APPENDECTOMY    . BACK SURGERY    . CATARACT EXTRACTION, BILATERAL     WITH IMPLANTS  . CERVICAL FUSION  08/2003  . CHOLECYSTECTOMY  03/2010  . EYE SURGERY    . LUMBAR DISC SURGERY    . LUMBAR LAMINECTOMY/DECOMPRESSION MICRODISCECTOMY  10/2010  . POSTERIOR FUSION LUMBAR SPINE  12/16/11; 12/2005; 07/2004   L2-3; L3-4; L4-5  . TOTAL KNEE ARTHROPLASTY Right 03/18/2017   Procedure:  RIGHT TOTAL KNEE ARTHROPLASTY;  Surgeon: Eugenia Mcalpine, MD;  Location: [facility];  Service: Orthopedics;  Laterality: Right;    Current Outpatient Medications  Medication Sig Dispense Refill  . ACCU-CHEK FASTCLIX LANCETS MISC 1 each by Other route 2 (two) times daily. Use 1 lancet bid    . ACCU-CHEK SMARTVIEW test strip 1 strip by Other route 2 (two) times daily. Use 1 strip to check glucose twice a day    . amLODipine (NORVASC) 5 MG tablet Take 5 mg by mouth daily.    Marland Kitchen apixaban (ELIQUIS) 5 MG TABS tablet Take 1 tablet (5 mg total) by mouth 2 (two) times daily. 180 tablet 1  . Cholecalciferol (VITAMIN D3) 1000 UNITS CAPS Take 1,000 Units by mouth daily.    . hydrochlorothiazide (HYDRODIURIL) 25 MG tablet Take 25 mg by mouth daily.    Marland Kitchen levothyroxine (SYNTHROID, LEVOTHROID) 125 MCG tablet Take 125 mcg by mouth daily.    Marland Kitchen losartan (COZAAR) 100 MG tablet Take 100 mg by mouth daily.    . metFORMIN (GLUCOPHAGE) 500 MG tablet Take 500-1,000 mg by mouth 2 (two) times daily. 500 mg in the morning & 1000 mg in the evening    . rosuvastatin (CRESTOR) 5 MG tablet Take 5 mg by mouth  2 (two) times a week.     No current facility-administered medications for this visit.    Allergies  Allergen Reactions  . Other Other (See Comments)  . Codeine Other (See Comments)    Severe Headaches with codeine; but not with other opioids  . Percocet [Oxycodone-Acetaminophen] Other (See Comments)    hallucinations    Review of Systems negative except from HPI and PMH  Physical Exam BP (!) 162/72   Pulse 67   Ht 5\' 4"  (1.626 m)   Wt 187 lb (84.8 kg)   SpO2 98%   BMI 32.10 kg/m  Well developed and nourished in no acute distress HENT normal Neck supple with JVP-  flat   Clear Regular rate and rhythm, no murmurs or gallops Abd-soft with active BS No Clubbing cyanosis edema Skin-warm and dry A & Oriented  Grossly normal sensory and motor function  ECG  Sinus 67 14/13/   Assessment and   Plan  Paroxysmal atrial fibrillation  Left bundle branch block  Hypertension  Palpitations   Recurrent palpitations, she reminds me that she has no palpitations with her atrial fibrillation.  These are problematic in their frequency and the associated symptoms.  We will use a Zio patch to try to elucidate.  It may be atrial fibrillation although this would be a little bit unusual to have short burst like this, suspect atrial tachycardia  She has blood pressure issues both here and she thinks at home.  I gave her the Consumer Reports buying guide for blood pressure machines.  No bleeding on the Eliquis.  Will need a CBC.

## 2021-03-27 DIAGNOSIS — R000 Tachycardia, unspecified: Secondary | ICD-10-CM

## 2021-03-27 DIAGNOSIS — I480 Paroxysmal atrial fibrillation: Secondary | ICD-10-CM | POA: Diagnosis not present

## 2021-05-27 ENCOUNTER — Other Ambulatory Visit: Payer: Self-pay | Admitting: Endocrinology

## 2021-05-27 DIAGNOSIS — Z1231 Encounter for screening mammogram for malignant neoplasm of breast: Secondary | ICD-10-CM

## 2021-06-14 IMAGING — CR DG CHEST 2V
2 series · 2 of 2 positions shown · non-contrast
Comparison: 01/06/2018 chest radiograph.

CLINICAL DATA: Cough and headache for 1 week

EXAM:
CHEST - 2 VIEW

[w chest pa]
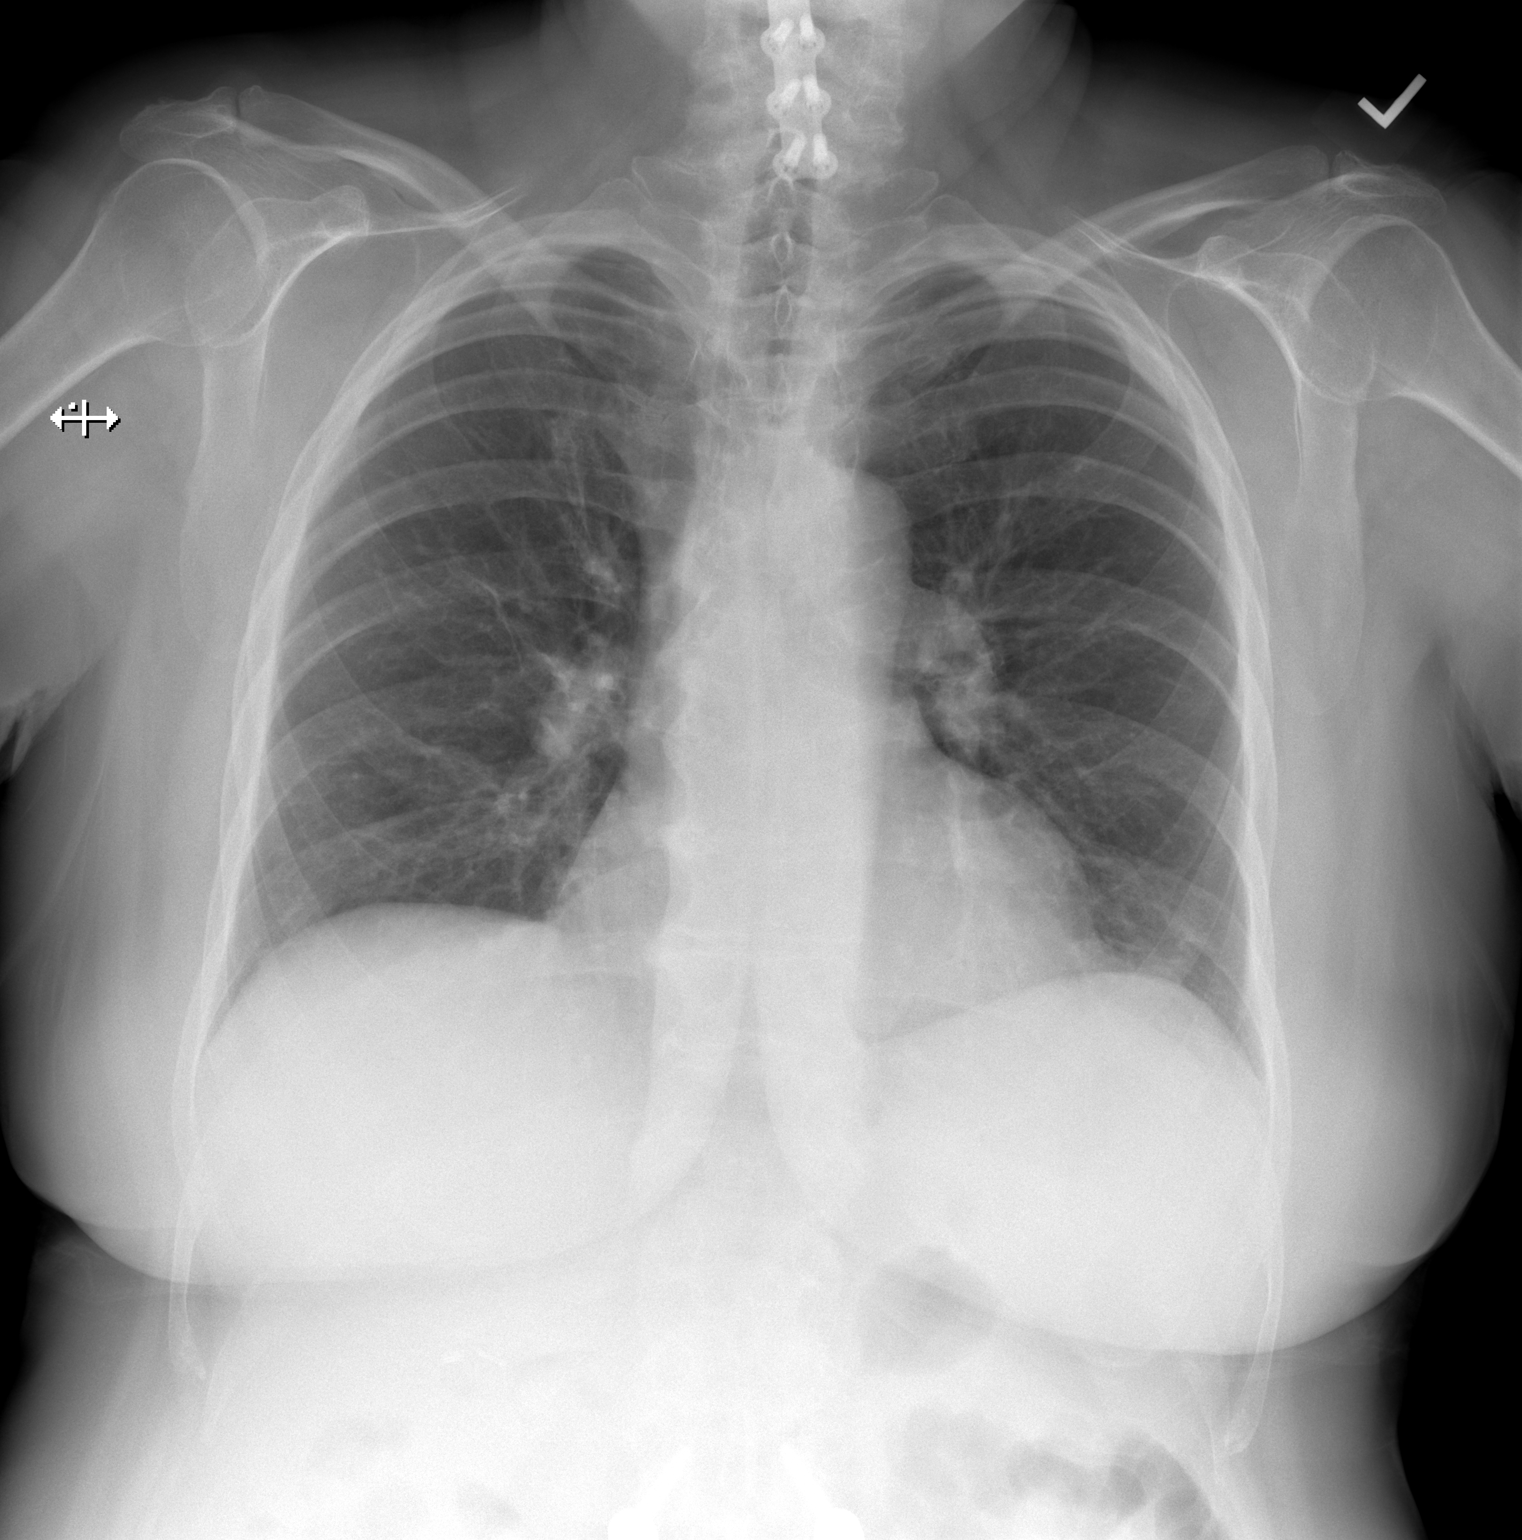

[w chest lat]
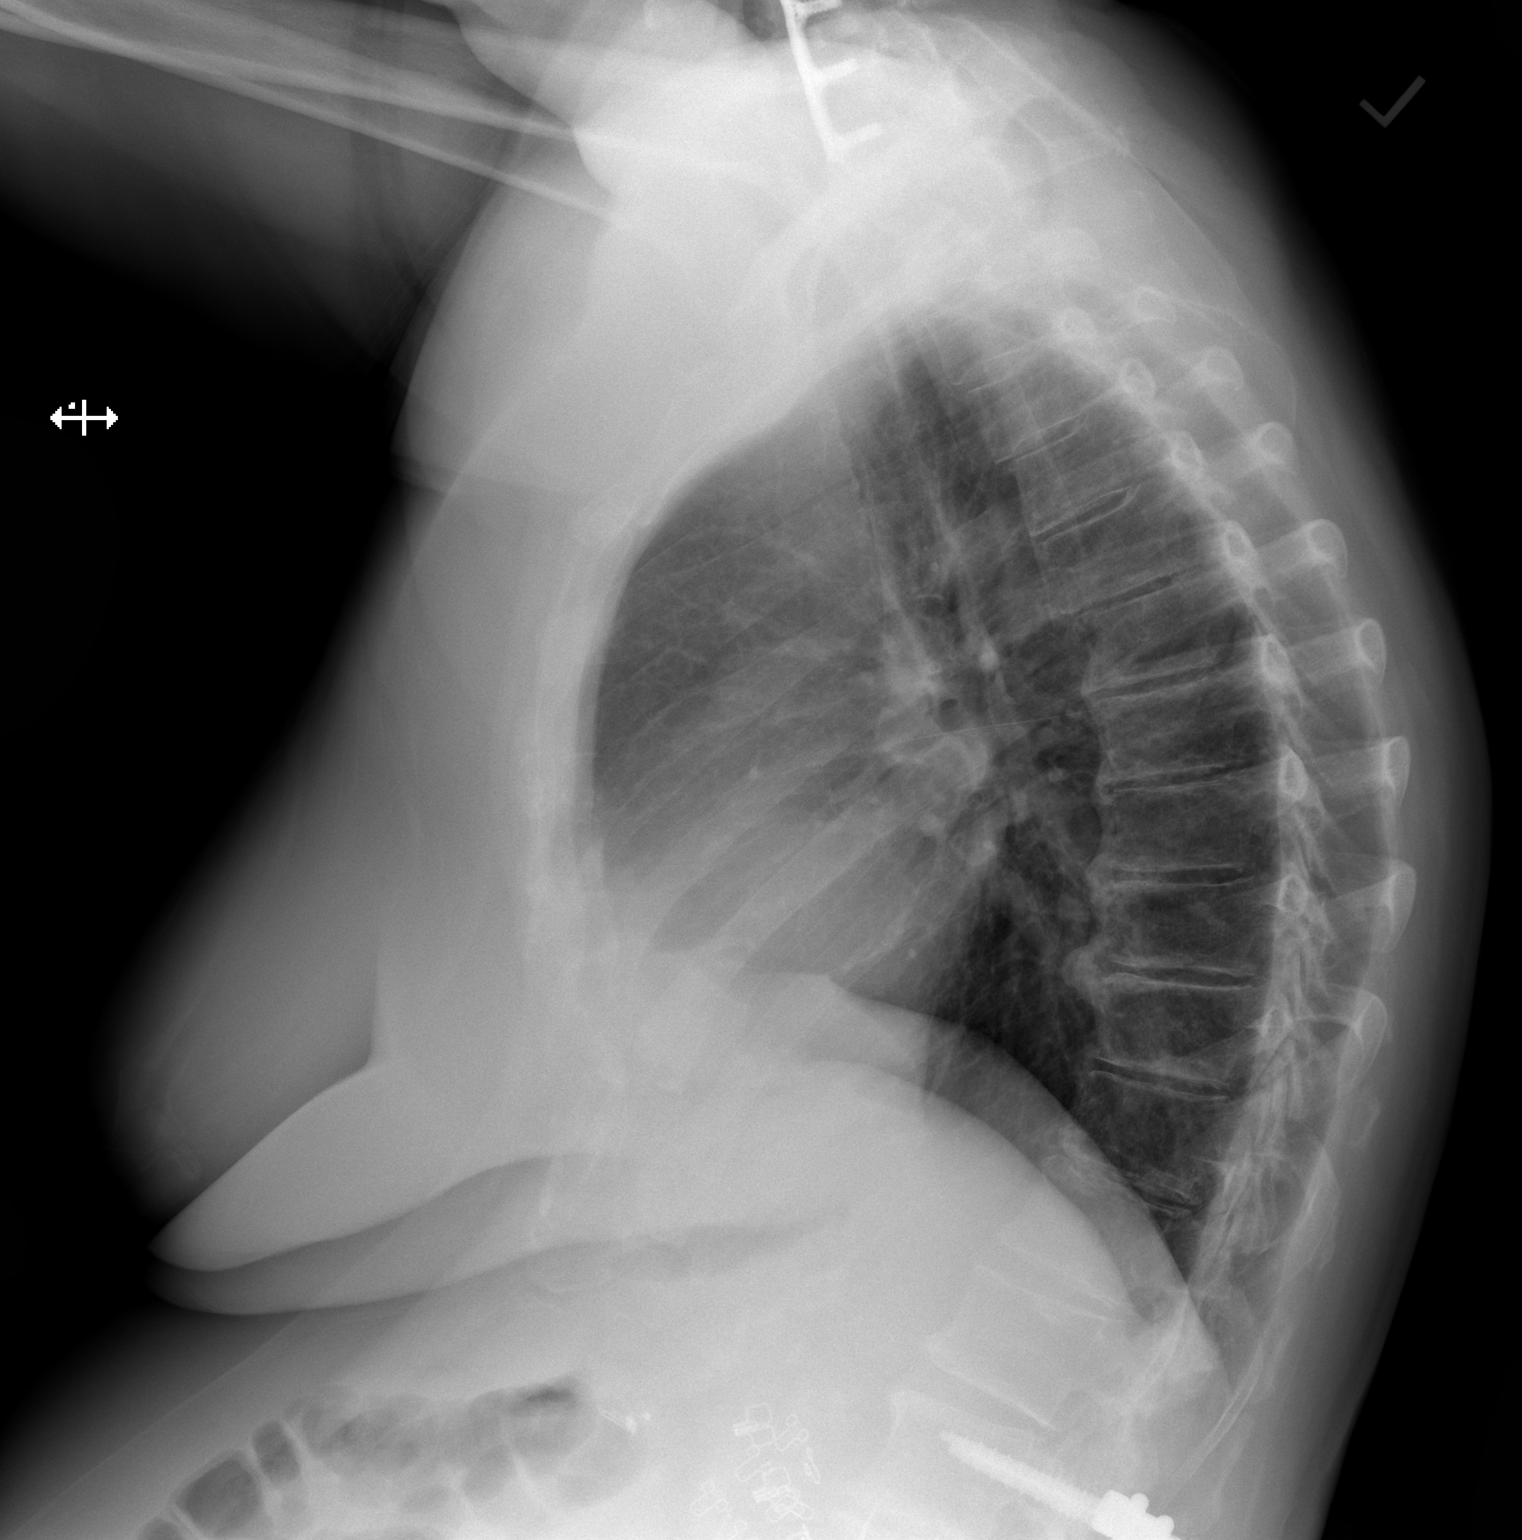

[2 of 2 positions shown; findings below may reference images not displayed]

FINDINGS: Stable cardiomediastinal silhouette with normal heart size. No
pneumothorax. No pleural effusion. Lungs appear clear, with no acute
consolidative airspace disease and no pulmonary edema. Partially
visualized surgical hardware from ACDF and posterior spinal fusion
hardware in the lumbar spine.
IMPRESSION: No active cardiopulmonary disease.

## 2021-07-01 ENCOUNTER — Ambulatory Visit
Admission: RE | Admit: 2021-07-01 | Discharge: 2021-07-01 | Disposition: A | Discharge status: Home / Self Care | Payer: Medicare Other | Source: Ambulatory Visit

## 2021-07-01 ENCOUNTER — Other Ambulatory Visit: Payer: Self-pay

## 2021-07-01 DIAGNOSIS — Z1231 Encounter for screening mammogram for malignant neoplasm of breast: Secondary | ICD-10-CM

## 2021-07-11 ENCOUNTER — Other Ambulatory Visit: Payer: Self-pay

## 2021-07-11 ENCOUNTER — Ambulatory Visit (HOSPITAL_COMMUNITY)
Admission: EM | Admit: 2021-07-11 | Discharge: 2021-07-11 | Disposition: A | Discharge status: Home / Self Care | Payer: Medicare Other | Attending: Physician Assistant | Admitting: Physician Assistant

## 2021-07-11 ENCOUNTER — Ambulatory Visit (INDEPENDENT_AMBULATORY_CARE_PROVIDER_SITE_OTHER): Payer: Medicare Other

## 2021-07-11 ENCOUNTER — Encounter (HOSPITAL_COMMUNITY): Payer: Self-pay

## 2021-07-11 DIAGNOSIS — M25611 Stiffness of right shoulder, not elsewhere classified: Secondary | ICD-10-CM | POA: Diagnosis not present

## 2021-07-11 DIAGNOSIS — W19XXXA Unspecified fall, initial encounter: Secondary | ICD-10-CM | POA: Diagnosis not present

## 2021-07-11 DIAGNOSIS — M25511 Pain in right shoulder: Secondary | ICD-10-CM

## 2021-07-11 NOTE — ED Triage Notes (Signed)
Pt presents with a fall that occurred around 7pm last night.   State she is concerned of her knee possibly being dislocated. C/o being unable to move rotate her knee.  States her knee "gave out" on her.

## 2021-07-11 NOTE — Discharge Instructions (Addendum)
We need to arrange some additional imaging (CT scan or MRI) of her shoulder.  Please call orthopedic provider immediately after our visit to schedule an appointment.  Use sling to help with pain.  You can use Tylenol for pain relief.  If you have any increased pain, inability to move your arm, weakness, numbness, tingling sensation you need to be seen immediately.

## 2021-07-11 NOTE — ED Provider Notes (Signed)
[facility]    CSN: [id number] Arrival date & time: 07/11/21  3818      History   Chief Complaint Chief Complaint  Patient presents with   Fall    HPI Destiny Brown is a 75 y.o. female.   Patient presents today with a 1 day history of right shoulder pain following fall.  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  She fell onto her right shoulder and has had persistent pain since that time.  Pain is rated 8/9 on a 0-10 pain scale, localized to right shoulder with radiation into arm, described as aching, worse with certain movements, no alleviating factors identified.  She has been taking Tylenol without improvement of symptoms.  She is right-handed.  She is having difficulty with daily activities as result of decreased range of motion including dressing herself this morning.  She denies any numbness or paresthesias.  She denies previous injury or surgery to right shoulder.  She is confident that she did not hit her head.  Denies loss of consciousness, changes to vision, nausea, vomiting, amnesia surrounding event.   Past Medical History:  Diagnosis Date   Arthritis    Atrial fibrillation with RVR (HCC) 09/21/2014   Bronchitis    h/o   Diabetes mellitus    NO MEDS,  DIET CONTROLLED   Headache(784.0)    History of migraines    "when working under alot of stress"   Hypertension    Hypothyroid    LBBB (left bundle branch block) 09/22/2014   OSA (obstructive sleep apnea) 04/09/2015   Moderate OSA with AHI 16.7 events per hour with successful CPAP titration to 8cm H2O   PONV (postoperative nausea and vomiting)    PT DOESNOT WANT TO TAKE HER ATENOLOL     DOS.   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    Past Surgical History:  Procedure Laterality Date   ABDOMINAL HYSTERECTOMY  03/2001   APPENDECTOMY     BACK SURGERY     CATARACT EXTRACTION, BILATERAL     WITH IMPLANTS   CERVICAL FUSION  08/2003   CHOLECYSTECTOMY  03/2010   EYE SURGERY     LUMBAR DISC SURGERY     LUMBAR LAMINECTOMY/DECOMPRESSION MICRODISCECTOMY  10/2010   POSTERIOR FUSION LUMBAR SPINE  12/16/11; 12/2005; 07/2004   L2-3; L3-4; L4-5   TOTAL KNEE ARTHROPLASTY Right 03/18/2017   Procedure: RIGHT TOTAL KNEE ARTHROPLASTY;  Surgeon: Eugenia Mcalpine, MD;  Location: [facility];  Service: Orthopedics;  Laterality: Right;    OB History   No obstetric history on file.      Home Medications    Prior to Admission medications   Medication Sig Start Date End Date Taking? Authorizing Provider  ACCU-CHEK FASTCLIX LANCETS MISC 1 each by Other route 2 (two) times daily. Use 1 lancet bid 10/21/15   [provider]  ACCU-CHEK SMARTVIEW test strip 1 strip by Other  route 2 (two) times daily. Use 1 strip to check glucose twice a day 10/21/15   [provider]  amLODipine (NORVASC) 5 MG tablet Take 5 mg by mouth daily. 12/14/16   [provider]  apixaban (ELIQUIS) 5 MG TABS tablet Take 1 tablet (5 mg total) by mouth 2 (two) times daily. 01/03/15   Duke Salvia, MD  Cholecalciferol (VITAMIN D3) 1000 UNITS CAPS Take 1,000 Units by mouth daily.    [provider]  hydrochlorothiazide (HYDRODIURIL) 25 MG tablet Take 25 mg by mouth daily.    [provider]  levothyroxine (SYNTHROID, LEVOTHROID) 125 MCG tablet Take 125 mcg by mouth daily.    [provider]  losartan (COZAAR) 100 MG tablet Take 100 mg by  mouth daily.    [provider]  metFORMIN (GLUCOPHAGE) 500 MG tablet Take 500-1,000 mg by mouth 2 (two) times daily. 500 mg in the morning & 1000 mg in the evening    [provider]  rosuvastatin (CRESTOR) 5 MG tablet Take 5 mg by mouth 2 (two) times a week.    [provider]    Family History Family History  Adopted: Yes  Family history unknown: Yes    Social History Social History   Tobacco Use   Smoking status: Never   Smokeless tobacco: Never  Substance Use Topics   Alcohol use: No   Drug use: No     Allergies   Other, Codeine, and Percocet [oxycodone-acetaminophen]   Review of Systems Review of Systems  Constitutional:  Positive for activity change. Negative for appetite change, fatigue and fever.  Eyes:  Negative for photophobia and visual disturbance.  Respiratory:  Negative for cough and shortness of breath.   Cardiovascular:  Negative for chest pain.  Gastrointestinal:  Negative for abdominal pain, diarrhea, nausea and vomiting.  Musculoskeletal:  Positive for arthralgias. Negative for myalgias.  Neurological:  Negative for dizziness, seizures, syncope, weakness, light-headedness, numbness and headaches.    Physical Exam Triage Vital Signs ED Triage Vitals  Enc Vitals Group     BP 07/11/21 0831 (!) 161/61     Pulse Rate 07/11/21 0831 67     Resp 07/11/21 0831 16     Temp 07/11/21 0831 98 F (36.7 C)     Temp Source 07/11/21 0831 Oral     SpO2 07/11/21 0831 100 %     Weight --      Height --      Head Circumference --      Peak Flow --      Pain Score 07/11/21 0833 9     Pain Loc --      Pain Edu? --      Excl. in GC? --    No data found.  Updated Vital Signs BP (!) 161/61 (BP Location: Right Arm) Comment: states her BP is being monitored  Pulse 67   Temp 98 F (36.7 C) (Oral)   Resp 16   SpO2 100%   Visual Acuity Right Eye Distance:   Left Eye Distance:   Bilateral Distance:    Right Eye Near:   Left Eye  Near:    Bilateral Near:     Physical Exam Vitals reviewed.  Constitutional:      General: She is awake. She is not in acute distress.    Appearance: Normal appearance. She is normal weight. She is not ill-appearing.     Comments: Very pleasant female appears stated age in no acute distress  sitting comfortably in exam room  HENT:     Head: Normocephalic and atraumatic. No raccoon eyes, Battle's sign or contusion.     Right Ear: Tympanic membrane, ear canal and external ear normal. No hemotympanum.     Left Ear: Tympanic membrane, ear canal and external ear normal. No hemotympanum.     Mouth/Throat:     Tongue: Tongue does not deviate from midline.  Eyes:     Extraocular Movements: Extraocular movements intact.     Pupils: Pupils are equal, round, and reactive to light.  Cardiovascular:     Rate and Rhythm: Normal rate and regular rhythm.     Pulses:          Radial pulses are 2+ on the right side and 2+ on the left side.     Heart sounds: Normal heart sounds, S1 normal and S2 normal. No murmur heard. Pulmonary:     Effort: Pulmonary effort is normal.     Breath sounds: Normal breath sounds. No wheezing, rhonchi or rales.     Comments: Clear to auscultation bilaterally Musculoskeletal:     Right shoulder: Tenderness and bony tenderness present. No swelling. Decreased range of motion. Decreased strength.     Cervical back: No spinous process tenderness or muscular tenderness.     Comments: Right shoulder: Decreased range of motion with forward flexion, abduction, extension.  Normal passive range of motion but decreased strength.  Patient unable to form a special test to monitor rotator cuff.  No deformity noted.  Hand neurovascularly intact.  Lymphadenopathy:     Head:     Right side of head: No submental, submandibular or tonsillar adenopathy.     Left side of head: No submental, submandibular or tonsillar adenopathy.  Neurological:     General: No focal deficit present.      Cranial Nerves: Cranial nerves are intact.     Motor: Motor function is intact.     Coordination: Coordination is intact.     Gait: Gait is intact.     Comments: Cranial nerves II through XII intact.  Psychiatric:        Behavior: Behavior is cooperative.     UC Treatments / Results  Labs (all labs ordered are listed, but only abnormal results are displayed) Labs Reviewed - No data to display  EKG   Radiology DG Shoulder Right  Result Date: 07/11/2021 CLINICAL DATA:  Fall, right shoulder pain EXAM: RIGHT SHOULDER - 2+ VIEW COMPARISON:  None. FINDINGS: Irregular bone fragment is noted along the inferior glenoid. No subluxation or dislocation. [facility]. IMPRESSION: Irregular bone fragment adjacent to the inferior glenoid may reflect avulsion. This could be further evaluated with CT or MRI if felt clinically indicated. Electronically Signed   By: Charlett NoseKevin  Dover M.D.   On: 07/11/2021 09:05    Procedures Procedures (including critical care time)  Medications Ordered in UC Medications - No data to display  Initial Impression / Assessment and Plan / UC Course  I have reviewed the triage vital signs and the nursing notes.  Pertinent labs & imaging results that were available during my care of the patient were reviewed by me and considered in my medical decision making (see chart for details).      Discussed with patient that given age and chronic anticoagulation if there was any concern she had her head she would need a head CT.  Patient is confident that she did not hit her head and is  not interested in going to the emergency room for head imaging.  X-ray of right shoulder obtained showed irregular bone fragment which could represent avulsion.  Radiologist recommended more advanced imaging and discussed with patient that this is not something we can obtain in urgent care.  She was given contact information for local orthopedic clinic and instructed to call  them immediately after our visit to schedule an appointment as soon as possible.  She was placed in sling for comfort and encouraged to use over-the-counter analgesics (Tylenol) for pain relief.  Discussed alarm symptoms that warrant emergent evaluation.  Strict return precautions given to which patient expressed understanding.  Final Clinical Impressions(s) / UC Diagnoses   Final diagnoses:  Acute pain of right shoulder  Decreased range of motion of right shoulder  Fall, initial encounter     Discharge Instructions      We need to arrange some additional imaging (CT scan or MRI) of her shoulder.  Please call orthopedic provider immediately after our visit to schedule an appointment.  Use sling to help with pain.  You can use Tylenol for pain relief.  If you have any increased pain, inability to move your arm, weakness, numbness, tingling sensation you need to be seen immediately.     ED Prescriptions   None    PDMP not reviewed this encounter.   Jeani Hawking, PA-C 07/11/21 0086

## 2021-09-24 ENCOUNTER — Other Ambulatory Visit: Payer: Self-pay

## 2021-09-24 ENCOUNTER — Encounter: Payer: Self-pay | Admitting: Internal Medicine

## 2021-09-24 ENCOUNTER — Telehealth: Payer: Self-pay | Admitting: Internal Medicine

## 2021-09-24 ENCOUNTER — Ambulatory Visit: Payer: Medicare Other | Admitting: Internal Medicine

## 2021-09-24 VITALS — BP 128/70 | HR 66 | Ht 65.0 in | Wt 187.6 lb

## 2021-09-24 DIAGNOSIS — I447 Left bundle-branch block, unspecified: Secondary | ICD-10-CM | POA: Diagnosis not present

## 2021-09-24 DIAGNOSIS — I480 Paroxysmal atrial fibrillation: Secondary | ICD-10-CM | POA: Diagnosis not present

## 2021-09-24 NOTE — Patient Instructions (Signed)

## 2021-09-24 NOTE — Telephone Encounter (Signed)
Follow up:     Patient returning a call back to nurse [facility]. They will be sending last office notes.

## 2021-09-24 NOTE — Progress Notes (Signed)
Patient Care Team: Adrian Prince, MD as PCP - General (Endocrinology) Duke Salvia, MD as PCP - Cardiology (Cardiology) Ernesto Rutherford, MD as Consulting Physician (Ophthalmology)   HPI  Destiny Brown is a 75 y.o. female Seen in followup for PAF presenting with RVR 9/15   She has LBBB.  HTN Dm  EF 55%;  myoview neg for ischemia  Increasing palpitations >> ZIO 3/22>> reviewed again today with the patient demonstrating symptomatic palpitations associated with sinus rhythm frequently but also occasionally with isolated ectopics.  There were a number of episodes of asymptomatic SVT  under a great deal of stress with her husband who got some memory issues and 2 grandsons with whom they live for whom a care 1 of whom has modest functional autism   Thromboembolic risk factors ( age -46, HTN-1, DM-1, Gender-1) for a CHADSVASc Score of 4      Date Cr Hgb  3/18 1.19 11.4  6/21  1.07      Past Medical History:  Diagnosis Date   Arthritis    Atrial fibrillation with RVR (HCC) 09/21/2014   Bronchitis    h/o   Diabetes mellitus    NO MEDS,  DIET CONTROLLED   Headache(784.0)    History of migraines    "when working under alot of stress"   Hypertension    Hypothyroid    LBBB (left bundle branch block) 09/22/2014   OSA (obstructive sleep apnea) 04/09/2015   Moderate OSA with AHI 16.7 events per hour with successful CPAP titration to 8cm H2O   PONV (postoperative nausea and vomiting)    PT DOESNOT WANT TO TAKE HER ATENOLOL     DOS.   " IT DROPS MY BP TO LOW"    Past Surgical History:  Procedure Laterality Date   ABDOMINAL HYSTERECTOMY  03/2001   APPENDECTOMY     BACK SURGERY     CATARACT EXTRACTION, BILATERAL     WITH IMPLANTS   CERVICAL FUSION  08/2003   CHOLECYSTECTOMY  03/2010   EYE SURGERY     LUMBAR DISC SURGERY     LUMBAR LAMINECTOMY/DECOMPRESSION MICRODISCECTOMY  10/2010   POSTERIOR FUSION LUMBAR SPINE  12/16/11; 12/2005; 07/2004   L2-3; L3-4; L4-5   TOTAL KNEE  ARTHROPLASTY Right 03/18/2017   Procedure: RIGHT TOTAL KNEE ARTHROPLASTY;  Surgeon: Eugenia Mcalpine, MD;  Location: [facility];  Service: Orthopedics;  Laterality: Right;    Current Outpatient Medications  Medication Sig Dispense Refill   ACCU-CHEK FASTCLIX LANCETS MISC 1 each by Other route 2 (two) times daily. Use 1 lancet bid     ACCU-CHEK SMARTVIEW test strip 1 strip by Other route 2 (two) times daily. Use 1 strip to check glucose twice a day     amLODipine (NORVASC) 5 MG tablet Take 5 mg by mouth daily.     apixaban (ELIQUIS) 5 MG TABS tablet Take 1 tablet (5 mg total) by mouth 2 (two) times daily. 180 tablet 1   Cholecalciferol (VITAMIN D3) 1000 UNITS CAPS Take 1,000 Units by mouth daily.     hydrochlorothiazide (HYDRODIURIL) 25 MG tablet Take 25 mg by mouth daily.     latanoprost (XALATAN) 0.005 % ophthalmic solution latanoprost 0.005 % eye drops  INSTILL 1 DROP INTO BOTH EYES EVERY DAY     levothyroxine (SYNTHROID, LEVOTHROID) 125 MCG tablet Take 125 mcg by mouth daily.     losartan (COZAAR) 100 MG tablet Take 100 mg by mouth daily.     metFORMIN (  GLUCOPHAGE) 500 MG tablet Take 500-1,000 mg by mouth 2 (two) times daily. 500 mg in the morning & 1000 mg in the evening     rosuvastatin (CRESTOR) 5 MG tablet Take 5 mg by mouth 2 (two) times a week.     No current facility-administered medications for this visit.    Allergies  Allergen Reactions   Other Other (See Comments)   Codeine Other (See Comments)    Severe Headaches with codeine; but not with other opioids   Percocet [Oxycodone-Acetaminophen] Other (See Comments)    hallucinations    Review of Systems negative except from HPI and PMH  Physical Exam BP 128/70   Pulse 66   Ht 5\' 5"  (1.651 m)   Wt 187 lb 9.6 oz (85.1 kg)   SpO2 98%   BMI 31.22 kg/m  Well developed and nourished in no acute distress HENT normal Neck supple with JVP-  flat   Clear Regular rate and rhythm, no murmurs or gallops Abd-soft with active BS No  Clubbing cyanosis edema Skin-warm and dry A & Oriented  Grossly normal sensory and motor function  ECG sinus at 66 13/14/42 Left bundle branch block   Assessment and  Plan  Paroxysmal atrial fibrillation  Left bundle branch block  Hypertension  Palpitations  BP is elevated at home.  Although it is normal today.  I have asked her to take her blood pressure machine to Dr. [id number] office today when she sees him with her husband.  This will allow correlation of her machine and the medical office.  If indeed it is 160, she will need up titration of her medications and would probably begin by increasing her amlodipine from 5--10.  For now we will continue her losartan 100 mg daily and her hydrochlorothiazide at 25.  Her hemoglobin apparently is normal.  We will continue her on Eliquis 5 mg twice daily.  She has had no symptomatic interval atrial fibrillation.  Her palpitations as reviewed above are frequently associated with sinus rhythm and occasionally with isolated ectopics.  Discussion today regarding the distinction between atrial fibrillation left bundle branch block  Palpitations are associated with both sinus rhythm as well as isolated ectopics, reassured no significant arrhythmias

## 2021-09-25 ENCOUNTER — Ambulatory Visit: Payer: Medicare Other | Admitting: Internal Medicine

## 2021-09-25 NOTE — Telephone Encounter (Signed)
Spoke with pt who states previous labs have been faxed.  Pt thanked Charity fundraiser for the call back.

## 2022-02-12 ENCOUNTER — Other Ambulatory Visit: Payer: Self-pay | Admitting: Orthopedic Surgery

## 2022-02-12 DIAGNOSIS — Z01811 Encounter for preprocedural respiratory examination: Secondary | ICD-10-CM

## 2022-02-12 NOTE — Progress Notes (Signed)
Surgery orders requested via Epic inbox. °

## 2022-02-16 ENCOUNTER — Encounter (HOSPITAL_COMMUNITY): Payer: Self-pay

## 2022-02-16 ENCOUNTER — Other Ambulatory Visit: Payer: Self-pay

## 2022-02-16 DIAGNOSIS — Z01812 Encounter for preprocedural laboratory examination: Secondary | ICD-10-CM | POA: Diagnosis present

## 2022-02-16 DIAGNOSIS — I480 Paroxysmal atrial fibrillation: Secondary | ICD-10-CM | POA: Diagnosis not present

## 2022-02-16 NOTE — Progress Notes (Signed)
Destiny Brown  02/16/2022   Your procedure is scheduled on:    02/26/22   Report to [facility] Main  Entrance   Report to admitting at   0700AM     Call this number if you have problems the morning of surgery [phone]    Remember: Do not eat food , candy gum or mints :After Midnight. You may have clear liquids from midnight until __  0640am    CLEAR LIQUID DIET   Foods Allowed                                                                       Coffee and tea, regular and decaf                              Plain Jell-O any favor except red or purple                                            Fruit ices (not with fruit pulp)                                      Iced Popsicles                                     Carbonated beverages, regular and diet                                    Cranberry, grape and apple juices Sports drinks like Gatorade Lightly seasoned clear broth or consume(fat free) Sugar   _____________________________________________________________________    BRUSH YOUR TEETH MORNING OF SURGERY AND RINSE YOUR MOUTH OUT, NO CHEWING GUM CANDY OR MINTS.     Take these medicines the morning of surgery with A SIP OF WATER:  eye drops as usual   DO NOT TAKE ANY DIABETIC MEDICATIONS DAY OF YOUR SURGERY                               You may not have any metal on your body including hair pins and              piercings  Do not wear jewelry, make-up, lotions, powders or perfumes, deodorant             Do not wear nail polish on your fingernails.  Do not shave  48 hours prior to surgery.              Men may shave face and neck.   Do not bring valuables to the hospital. [facility] IS NOT             RESPONSIBLE   FOR VALUABLES.  Contacts,  dentures or bridgework may not be worn into surgery.  Leave suitcase in the car. After surgery it may be brought to your room.     Patients discharged the day of surgery will not be allowed to  drive home. IF YOU ARE HAVING SURGERY AND GOING HOME THE SAME DAY, YOU MUST HAVE AN ADULT TO DRIVE YOU HOME AND BE WITH YOU FOR 24 HOURS. YOU MAY GO HOME BY TAXI OR UBER OR ORTHERWISE, BUT AN ADULT MUST ACCOMPANY YOU HOME AND STAY WITH YOU FOR 24 HOURS.  Name and phone number of your driver:  Special Instructions: N/A              Please read over the following fact sheets you were given: _____________________________________________________________________  [facility] - Preparing for Surgery Before surgery, you can play an important role.  Because skin is not sterile, your skin needs to be as free of germs as possible.  You can reduce the number of germs on your skin by washing with CHG (chlorahexidine gluconate) soap before surgery.  CHG is an antiseptic cleaner which kills germs and bonds with the skin to continue killing germs even after washing. Please DO NOT use if you have an allergy to CHG or antibacterial soaps.  If your skin becomes reddened/irritated stop using the CHG and inform your nurse when you arrive at Short Stay. Do not shave (including legs and underarms) for at least 48 hours prior to the first CHG shower.  You may shave your face/neck. Please follow these instructions carefully:  1.  Shower with CHG Soap the night before surgery and the  morning of Surgery.  2.  If you choose to wash your hair, wash your hair first as usual with your  normal  shampoo.  3.  After you shampoo, rinse your hair and body thoroughly to remove the  shampoo.                           4.  Use CHG as you would any other liquid soap.  You can apply chg directly  to the skin and wash                       Gently with a scrungie or clean washcloth.  5.  Apply the CHG Soap to your body ONLY FROM THE NECK DOWN.   Do not use on face/ open                           Wound or open sores. Avoid contact with eyes, ears mouth and genitals (private parts).                       Wash face,  Genitals (private parts)  with your normal soap.             6.  Wash thoroughly, paying special attention to the area where your surgery  will be performed.  7.  Thoroughly rinse your body with warm water from the neck down.  8.  DO NOT shower/wash with your normal soap after using and rinsing off  the CHG Soap.                9.  Pat yourself dry with a clean towel.            10.  Wear clean pajamas.  11.  Place clean sheets on your bed the night of your first shower and do not  sleep with pets. Day of Surgery : Do not apply any lotions/deodorants the morning of surgery.  Please wear clean clothes to the hospital/surgery center.  FAILURE TO FOLLOW THESE INSTRUCTIONS MAY RESULT IN THE CANCELLATION OF YOUR SURGERY PATIENT SIGNATURE_________________________________  NURSE SIGNATURE__________________________________  ________________________________________________________________________

## 2022-02-16 NOTE — Progress Notes (Signed)
Need orders in epic.  Surgery on 02/26/22.  Proep on 03/17/22.  Thank You.  Unable to get thru on phone lines on 02/16/22.

## 2022-02-16 NOTE — Progress Notes (Addendum)
Anesthesia Review:  PCP: DR Adrian Prince  Cardiologist : DR Sherryl Manges LOV 09/24/21  Chest x-ray : EKG : 09/24/21  Monitor- 05/2021  Echo : Stress test: Cardiac Cath :  Activity level:  unable to do a flgiht of stairs without difficulty  Sleep Study/ CPAP : no cpap in many years per pt  Fasting Blood Sugar :      / Checks Blood Sugar -- times a day:   Blood Thinner/ Instructions /Last Dose: ASA / Instructions/ Last Dose :   Eliquis - pt to check with cardiology and/or surgeon regarding preop instructions.  PT voiced understanding.  Type 2- checks glucose once daily in am  PT ot bring in labs on 02/17/22 of CBC and CMp and hgba1c- done at DR [place] on 01/31/22.   Medical hx and preop instructions reviewed with pt via phone on 02/16/22.  PT voiced understanding.  PT to come in on 02/17/22 topick up instructions and hibiclens and to have pcr completed.   No covid -ambulatory surgeyr  Reqeusted orders on 02/16/22.  No orders in as of time medical hx and proep instructions completed.   PT has had anesthesia issue in past back surgery that blood pressure went " too low" during surgery and pt was in hospital for few extra days per pt.  This back surgery was done at [facility] .  PT reports at preop phone call she will not take any blood pressure meds day of surgery.  Informed pt that [facility], [place] in PST department would be made aware of above incident.  PT has Spinal cord stimulator - to bring remote day of surgery.   CBC and CMp done 01/30/22 on chart  Hgba1c-02/06/22- on chart - 6.1  NO orders at time of preop appt.  Requested on 02/16/22.

## 2022-02-17 ENCOUNTER — Encounter (HOSPITAL_COMMUNITY)
Admission: RE | Admit: 2022-02-17 | Discharge: 2022-02-17 | Disposition: A | Discharge status: Home / Self Care | Payer: Medicare Other | Source: Ambulatory Visit | Attending: Orthopedic Surgery | Admitting: Orthopedic Surgery

## 2022-02-17 VITALS — BP 143/61 | HR 63 | Temp 97.9°F | Resp 18 | Ht 66.0 in | Wt 179.1 lb

## 2022-02-17 DIAGNOSIS — Z01818 Encounter for other preprocedural examination: Secondary | ICD-10-CM

## 2022-02-17 DIAGNOSIS — I480 Paroxysmal atrial fibrillation: Secondary | ICD-10-CM | POA: Insufficient documentation

## 2022-02-17 DIAGNOSIS — Z01812 Encounter for preprocedural laboratory examination: Secondary | ICD-10-CM | POA: Diagnosis not present

## 2022-02-17 LAB — SURGICAL PCR SCREEN
MRSA, PCR: NEGATIVE
Staphylococcus aureus: NEGATIVE

## 2022-02-17 LAB — GLUCOSE, CAPILLARY: Glucose-Capillary: 116 mg/dL — ABNORMAL HIGH (ref 70–99)

## 2022-02-18 ENCOUNTER — Telehealth: Payer: Self-pay | Admitting: *Deleted

## 2022-02-18 NOTE — Telephone Encounter (Signed)
Will route to pharm for input on anticoag, then pt will need call. Last OV 08/2021.

## 2022-02-18 NOTE — Telephone Encounter (Signed)
° °  Pre-operative Risk Assessment    Patient Name: Destiny Brown  DOB: 10-28-1946 MRN: [id number]      Request for Surgical Clearance    Procedure:   RIGHT REVERSE TOTAL SHOULDER ARTHROPLASTY  Date of Surgery:  Clearance 02/26/22                                 Surgeon:  DR. Dorna Leitz Surgeon's Group or Practice Name:  [facility] Phone number:  [phone]  Fax number:  [phone] ATTN: JUDY DANIELS   Type of Clearance Requested:   - Medical  - Pharmacy:  Hold Apixaban (Eliquis)     Type of Anesthesia:   CHOICE   Additional requests/questions:    Jiles Prows   02/18/2022, 2:38 PM

## 2022-02-18 NOTE — Telephone Encounter (Signed)
Patient with diagnosis of afib on Eliquis for anticoagulation.    Procedure: RIGHT REVERSE TOTAL SHOULDER ARTHROPLASTY Date of procedure: 02/26/22  CHA2DS2-VASc Score = 5   This indicates a 7.2% annual risk of stroke. The patient's score is based upon: CHF History: 0 HTN History: 1 Diabetes History: 1 Stroke History: 0 Vascular Disease History: 0 Age Score: 2 Gender Score: 1      CrCl 46 ml/min  Per office protocol, patient can hold Eliquis for 3 days prior to procedure.

## 2022-02-18 NOTE — Telephone Encounter (Signed)
° °  Name: Destiny Brown  DOB: 05-27-1946  MRN: [id number]   Primary Cardiologist: Sherryl Manges, MD  Chart reviewed as part of pre-operative protocol coverage. Patient was contacted 02/18/2022 in reference to pre-operative risk assessment for pending surgery as outlined below.  DANIJELA VESSEY was last seen 08/2021 by Dr. Graciela Husbands, primarily followed for hx of afib, SVT, LBBB. Last echo 2015 EF 50-55%, mild MR, grade 1 DD. No murmur noted in last OV. Nuc 2015 was normal. RCRI 0.4% indicating low CV risk. Called pt to assess sx, got VM. LMTCB.  Laurann Montana, PA-C 02/18/2022, 4:01 PM

## 2022-02-19 NOTE — Progress Notes (Signed)
Anesthesia Chart Review   Case: [id number] Date/Time: 02/26/22 9702   Procedure: REVERSE SHOULDER ARTHROPLASTY (Right: Shoulder)   Anesthesia type: Choice   Pre-op diagnosis: RIGHT SHOULDER ROTATOR CUFF TEAR   Location: [facility] ROOM 07 / [facility]   Surgeons: Jones Broom, MD       DISCUSSION:76 y.o. never smoker with h/o PONV, HTN, DM II diet controlled, A-fib (Eliquis), OSA, LBBB, right shoulder rotator cuff tear scheduled for above procedure 02/26/2022 with Dr. Jones Broom.   Per cardiology preoperative evaluation 02/18/22, "Chart reviewed as part of pre-operative protocol coverage. Patient was contacted 02/18/2022 in reference to pre-operative risk assessment for pending surgery as outlined below.  REILLY MOLCHAN was last seen 08/2021 by Dr. Graciela Husbands, primarily followed for hx of afib, SVT, LBBB. Last echo 2015 EF 50-55%, mild MR, grade 1 DD. No murmur noted in last OV. Nuc 2015 was normal. RCRI 0.4% indicating low CV risk. Called pt to assess sx, got VM. LMTCB."  Pt advised by pharmacist to hold Eliquis 3 days prior to surgery.   Anticipate pt can proceed with planned procedure barring acute status change.   VS: BP (!) 143/61    Pulse 63    Temp 36.6 C (Oral)    Resp 18    Ht 5\' 6"  (1.676 m)    Wt 81.3 kg    SpO2 99%    BMI 28.91 kg/m   PROVIDERS: , MD is PCP   Primary Cardiologist: Adrian Prince, MD  LABS: Labs reviewed: Acceptable for surgery. (all labs ordered are listed, but only abnormal results are displayed)  Labs Reviewed  GLUCOSE, CAPILLARY - Abnormal; Notable for the following components:      Result Value   Glucose-Capillary 116 (*)    All other components within normal limits  SURGICAL PCR SCREEN     IMAGES:   EKG: 09/24/2021 Rate 66 bpm    CV: Echo 09/22/2014 - Left ventricle: The cavity size was normal. There was mild    concentric hypertrophy. Systolic function was normal. The    estimated ejection fraction was in the range of 50% to 55%.  Wall    motion was normal; there were no regional wall motion    abnormalities. Doppler parameters are consistent with abnormal    left ventricular relaxation (grade 1 diastolic dysfunction).  - Aortic valve: Trileaflet; normal thickness leaflets. There was no    regurgitation.  - Aortic root: The aortic root was normal in size.  - Mitral valve: There was mild regurgitation.  - Left atrium: The atrium was normal in size.  - Right ventricle: Systolic function was normal.  - Right atrium: The atrium was normal in size.  - Tricuspid valve: There was trivial regurgitation.  - Pulmonary arteries: Systolic pressure was within the normal    range. Past Medical History:  Diagnosis Date   Arthritis    Atrial fibrillation with RVR (HCC) 09/21/2014   Bronchitis    h/o   Diabetes mellitus    NO MEDS,  DIET CONTROLLED   History of migraines    "when working under alot of stress"   Hypertension    Hypothyroid    LBBB (left bundle branch block) 09/22/2014   OSA (obstructive sleep apnea) 04/09/2015   Moderate OSA with AHI 16.7 events per hour with successful CPAP titration to 8cm H2O   PONV (postoperative nausea and vomiting)    PT DOESNOT WANT TO TAKE HER ATENOLOL     DOS.   " IT  DROPS MY BP TO LOW"    Past Surgical History:  Procedure Laterality Date   ABDOMINAL HYSTERECTOMY  03/2001   APPENDECTOMY     BACK SURGERY     CATARACT EXTRACTION, BILATERAL     WITH IMPLANTS   CERVICAL FUSION  08/2003   CHOLECYSTECTOMY  03/2010   EYE SURGERY     LUMBAR DISC SURGERY     LUMBAR LAMINECTOMY/DECOMPRESSION MICRODISCECTOMY  10/2010   POSTERIOR FUSION LUMBAR SPINE  12/16/11; 12/2005; 07/2004   L2-3; L3-4; L4-5   TOTAL KNEE ARTHROPLASTY Right 03/18/2017   Procedure: RIGHT TOTAL KNEE ARTHROPLASTY;  Surgeon: Eugenia Mcalpine, MD;  Location: [facility];  Service: Orthopedics;  Laterality: Right;    MEDICATIONS:  ACCU-CHEK FASTCLIX LANCETS MISC   ACCU-CHEK SMARTVIEW test strip   amLODipine (NORVASC) 5 MG  tablet   apixaban (ELIQUIS) 5 MG TABS tablet   Cholecalciferol (VITAMIN D3) 1000 UNITS CAPS   hydrochlorothiazide (HYDRODIURIL) 25 MG tablet   latanoprost (XALATAN) 0.005 % ophthalmic solution   levothyroxine (SYNTHROID, LEVOTHROID) 125 MCG tablet   losartan (COZAAR) 100 MG tablet   metFORMIN (GLUCOPHAGE) 1000 MG tablet   rosuvastatin (CRESTOR) 5 MG tablet   No current facility-administered medications for this encounter.    Jodell Cipro Ward, PA-C [facility] [phone]

## 2022-02-19 NOTE — Telephone Encounter (Signed)
I s/w the pt and advised I will have the pre op provider call her back tomorrow for pre op assessment. Pt is thankful for the call back today and letting her know.

## 2022-02-19 NOTE — Telephone Encounter (Signed)
° °  Pt is calling back to f/u °

## 2022-02-19 NOTE — Telephone Encounter (Signed)
Patient was returning call 

## 2022-02-20 NOTE — Telephone Encounter (Signed)
Call should had been routed back to pre op pool provider pool, not call back pool. I will forward to pre op pool .

## 2022-02-20 NOTE — Telephone Encounter (Signed)
Pt returning phone call... please advise  

## 2022-02-23 NOTE — Telephone Encounter (Signed)
° °  Primary Cardiologist: Virl Axe, MD  Chart reviewed as part of pre-operative protocol coverage. Given past medical history and time since last visit, based on ACC/AHA guidelines, KELILA DEVAUL would be at acceptable risk for the planned procedure without further cardiovascular testing.   Patient with diagnosis of afib on Eliquis for anticoagulation.     Procedure: RIGHT REVERSE TOTAL SHOULDER ARTHROPLASTY Date of procedure: 02/26/22   CHA2DS2-VASc Score = 5   This indicates a 7.2% annual risk of stroke. The patient's score is based upon: CHF History: 0 HTN History: 1 Diabetes History: 1 Stroke History: 0 Vascular Disease History: 0 Age Score: 2 Gender Score: 1       CrCl 46 ml/min   Per office protocol, patient can hold Eliquis for 3 days prior to procedure.  Patient was advised that if she develops new symptoms prior to surgery to contact our office to arrange a follow-up appointment.  He verbalized understanding.  I will route this recommendation to the requesting party via Epic fax function and remove from pre-op pool.  Please call with questions.  Jossie Ng. Dynesha Woolen NP-C    02/23/2022, 10:35 AM [facility] [address] Office [phone] Fax [phone]

## 2022-02-26 ENCOUNTER — Encounter (HOSPITAL_COMMUNITY)
Admission: RE | Disposition: A | Discharge status: Home / Self Care | Payer: Self-pay | Source: Home / Self Care | Attending: Orthopedic Surgery

## 2022-02-26 ENCOUNTER — Other Ambulatory Visit: Payer: Self-pay

## 2022-02-26 ENCOUNTER — Encounter (HOSPITAL_COMMUNITY): Payer: Self-pay | Admitting: Orthopedic Surgery

## 2022-02-26 ENCOUNTER — Ambulatory Visit (HOSPITAL_COMMUNITY)
Admission: RE | Admit: 2022-02-26 | Discharge: 2022-02-26 | Disposition: A | Discharge status: Home / Self Care | Payer: Medicare Other | Attending: Orthopedic Surgery | Admitting: Orthopedic Surgery

## 2022-02-26 ENCOUNTER — Ambulatory Visit (HOSPITAL_COMMUNITY): Payer: Medicare Other

## 2022-02-26 ENCOUNTER — Ambulatory Visit (HOSPITAL_COMMUNITY): Payer: Medicare Other | Admitting: Physician Assistant

## 2022-02-26 ENCOUNTER — Ambulatory Visit (HOSPITAL_BASED_OUTPATIENT_CLINIC_OR_DEPARTMENT_OTHER): Payer: Medicare Other | Admitting: Certified Registered Nurse Anesthetist

## 2022-02-26 DIAGNOSIS — E039 Hypothyroidism, unspecified: Secondary | ICD-10-CM | POA: Insufficient documentation

## 2022-02-26 DIAGNOSIS — I4891 Unspecified atrial fibrillation: Secondary | ICD-10-CM | POA: Diagnosis not present

## 2022-02-26 DIAGNOSIS — Z96611 Presence of right artificial shoulder joint: Secondary | ICD-10-CM

## 2022-02-26 DIAGNOSIS — I480 Paroxysmal atrial fibrillation: Secondary | ICD-10-CM

## 2022-02-26 DIAGNOSIS — E119 Type 2 diabetes mellitus without complications: Secondary | ICD-10-CM | POA: Insufficient documentation

## 2022-02-26 DIAGNOSIS — S46011A Strain of muscle(s) and tendon(s) of the rotator cuff of right shoulder, initial encounter: Secondary | ICD-10-CM | POA: Diagnosis not present

## 2022-02-26 DIAGNOSIS — M19011 Primary osteoarthritis, right shoulder: Secondary | ICD-10-CM | POA: Insufficient documentation

## 2022-02-26 DIAGNOSIS — I10 Essential (primary) hypertension: Secondary | ICD-10-CM | POA: Insufficient documentation

## 2022-02-26 DIAGNOSIS — M12811 Other specific arthropathies, not elsewhere classified, right shoulder: Secondary | ICD-10-CM

## 2022-02-26 DIAGNOSIS — Z01811 Encounter for preprocedural respiratory examination: Secondary | ICD-10-CM

## 2022-02-26 DIAGNOSIS — M75101 Unspecified rotator cuff tear or rupture of right shoulder, not specified as traumatic: Secondary | ICD-10-CM

## 2022-02-26 DIAGNOSIS — W19XXXA Unspecified fall, initial encounter: Secondary | ICD-10-CM | POA: Insufficient documentation

## 2022-02-26 DIAGNOSIS — G4733 Obstructive sleep apnea (adult) (pediatric): Secondary | ICD-10-CM | POA: Insufficient documentation

## 2022-02-26 HISTORY — PX: REVERSE SHOULDER ARTHROPLASTY: SHX5054

## 2022-02-26 LAB — GLUCOSE, CAPILLARY: Glucose-Capillary: 125 mg/dL — ABNORMAL HIGH (ref 70–99)

## 2022-02-26 LAB — TYPE AND SCREEN
ABO/RH(D): A POS
Antibody Screen: NEGATIVE

## 2022-02-26 SURGERY — ARTHROPLASTY, SHOULDER, TOTAL, REVERSE
Anesthesia: General | Site: Shoulder | Laterality: Right

## 2022-02-26 MED ORDER — ONDANSETRON HCL 4 MG/2ML IJ SOLN
INTRAMUSCULAR | Status: AC
  Filled 2022-02-26: qty 2

## 2022-02-26 MED ORDER — ONDANSETRON HCL 4 MG/2ML IJ SOLN
INTRAMUSCULAR | Status: DC | PRN
  Administered 2022-02-26: 4 mg via INTRAVENOUS

## 2022-02-26 MED ORDER — PROPOFOL 10 MG/ML IV BOLUS
INTRAVENOUS | Status: AC
  Filled 2022-02-26: qty 20

## 2022-02-26 MED ORDER — EPHEDRINE SULFATE-NACL 50-0.9 MG/10ML-% IV SOSY
PREFILLED_SYRINGE | INTRAVENOUS | Status: DC | PRN
Start: 2022-02-26 — End: 2022-02-26
  Administered 2022-02-26: 10 mg via INTRAVENOUS

## 2022-02-26 MED ORDER — HYDROCODONE-ACETAMINOPHEN 5-325 MG PO TABS: 1.0000 | ORAL_TABLET | Freq: Four times a day (QID) | ORAL | 0 refills | Status: DC | PRN

## 2022-02-26 MED ORDER — PROPOFOL 10 MG/ML IV BOLUS
INTRAVENOUS | Status: DC | PRN
  Administered 2022-02-26: 100 mg via INTRAVENOUS

## 2022-02-26 MED ORDER — MIDAZOLAM HCL 2 MG/2ML IJ SOLN
INTRAMUSCULAR | Status: AC
  Filled 2022-02-26: qty 2

## 2022-02-26 MED ORDER — ORAL CARE MOUTH RINSE: 15.0000 mL | Freq: Once | OROMUCOSAL | Status: AC

## 2022-02-26 MED ORDER — DEXAMETHASONE SODIUM PHOSPHATE 10 MG/ML IJ SOLN
INTRAMUSCULAR | Status: DC | PRN
  Administered 2022-02-26: 10 mg via INTRAVENOUS

## 2022-02-26 MED ORDER — BUPIVACAINE LIPOSOME 1.3 % IJ SUSP
INTRAMUSCULAR | Status: DC | PRN
  Administered 2022-02-26: 10 mL via PERINEURAL

## 2022-02-26 MED ORDER — ROCURONIUM BROMIDE 10 MG/ML (PF) SYRINGE
PREFILLED_SYRINGE | INTRAVENOUS | Status: DC | PRN
  Administered 2022-02-26: 60 mg via INTRAVENOUS

## 2022-02-26 MED ORDER — LACTATED RINGERS IV SOLN
INTRAVENOUS | Status: DC
Start: 2022-02-26 — End: 2022-02-26

## 2022-02-26 MED ORDER — DEXAMETHASONE SODIUM PHOSPHATE 10 MG/ML IJ SOLN
INTRAMUSCULAR | Status: AC
  Filled 2022-02-26: qty 1

## 2022-02-26 MED ORDER — ONDANSETRON HCL 4 MG/2ML IJ SOLN: 4.0000 mg | Freq: Once | INTRAMUSCULAR | Status: DC | PRN

## 2022-02-26 MED ORDER — SUGAMMADEX SODIUM 200 MG/2ML IV SOLN
INTRAVENOUS | Status: DC | PRN
Start: 2022-02-26 — End: 2022-02-26
  Administered 2022-02-26 (×2): 200 mg via INTRAVENOUS

## 2022-02-26 MED ORDER — FENTANYL CITRATE PF 50 MCG/ML IJ SOSY
PREFILLED_SYRINGE | INTRAMUSCULAR | Status: AC
  Administered 2022-02-26: 50 ug
  Filled 2022-02-26: qty 2

## 2022-02-26 MED ORDER — ROCURONIUM BROMIDE 10 MG/ML (PF) SYRINGE
PREFILLED_SYRINGE | INTRAVENOUS | Status: AC
  Filled 2022-02-26: qty 10

## 2022-02-26 MED ORDER — ACETAMINOPHEN 10 MG/ML IV SOLN: 1000.0000 mg | Freq: Once | INTRAVENOUS | Status: DC | PRN

## 2022-02-26 MED ORDER — TRANEXAMIC ACID-NACL 1000-0.7 MG/100ML-% IV SOLN
1000.0000 mg | INTRAVENOUS | Status: AC
  Administered 2022-02-26: 1000 mg via INTRAVENOUS
  Filled 2022-02-26: qty 100

## 2022-02-26 MED ORDER — SODIUM CHLORIDE 0.9 % IR SOLN
Status: DC | PRN
  Administered 2022-02-26: 1000 mL

## 2022-02-26 MED ORDER — LIDOCAINE 2% (20 MG/ML) 5 ML SYRINGE
INTRAMUSCULAR | Status: DC | PRN
  Administered 2022-02-26: 60 mg via INTRAVENOUS

## 2022-02-26 MED ORDER — CEFAZOLIN SODIUM-DEXTROSE 2-4 GM/100ML-% IV SOLN
2.0000 g | INTRAVENOUS | Status: AC
  Administered 2022-02-26: 2 g via INTRAVENOUS
  Filled 2022-02-26: qty 100

## 2022-02-26 MED ORDER — FENTANYL CITRATE PF 50 MCG/ML IJ SOSY
PREFILLED_SYRINGE | INTRAMUSCULAR | Status: AC
  Administered 2022-02-26: 25 ug via INTRAVENOUS
  Filled 2022-02-26: qty 2

## 2022-02-26 MED ORDER — BUPIVACAINE HCL (PF) 0.5 % IJ SOLN
INTRAMUSCULAR | Status: DC | PRN
  Administered 2022-02-26: 15 mL via PERINEURAL

## 2022-02-26 MED ORDER — PHENYLEPHRINE 40 MCG/ML (10ML) SYRINGE FOR IV PUSH (FOR BLOOD PRESSURE SUPPORT)
PREFILLED_SYRINGE | INTRAVENOUS | Status: AC
  Filled 2022-02-26: qty 10

## 2022-02-26 MED ORDER — WATER FOR IRRIGATION, STERILE IR SOLN
Status: DC | PRN
  Administered 2022-02-26: 2000 mL

## 2022-02-26 MED ORDER — TIZANIDINE HCL 2 MG PO TABS: 2.0000 mg | ORAL_TABLET | Freq: Three times a day (TID) | ORAL | 0 refills | Status: DC | PRN

## 2022-02-26 MED ORDER — FENTANYL CITRATE PF 50 MCG/ML IJ SOSY: 25.0000 ug | PREFILLED_SYRINGE | INTRAMUSCULAR | Status: DC | PRN

## 2022-02-26 MED ORDER — 0.9 % SODIUM CHLORIDE (POUR BTL) OPTIME
TOPICAL | Status: DC | PRN
  Administered 2022-02-26: 1000 mL

## 2022-02-26 MED ORDER — CHLORHEXIDINE GLUCONATE 0.12 % MT SOLN
15.0000 mL | Freq: Once | OROMUCOSAL | Status: AC
  Administered 2022-02-26: 15 mL via OROMUCOSAL

## 2022-02-26 SURGICAL SUPPLY — 79 items
AID PSTN UNV HD RSTRNT DISP (MISCELLANEOUS) ×1
BAG COUNTER SPONGE SURGICOUNT (BAG) ×1 IMPLANT
BAG SPEC THK2 15X12 ZIP CLS (MISCELLANEOUS) ×1
BAG SPNG CNTER NS LX DISP (BAG) ×1
BAG ZIPLOCK 12X15 (MISCELLANEOUS) ×3 IMPLANT
BASEPLATE P2 COATD GLND 6.5X30 (Shoulder) IMPLANT
BIT DRILL 1.6MX128 (BIT) IMPLANT
BIT DRILL 2.5 DIA 127 CALI (BIT) ×1 IMPLANT
BIT DRILL 4 DIA CALIBRATED (BIT) ×1 IMPLANT
BLADE SAW SAG 73X25 THK (BLADE) ×1
BLADE SAW SGTL 73X25 THK (BLADE) ×2 IMPLANT
BOOTIES KNEE HIGH SLOAN (MISCELLANEOUS) ×6 IMPLANT
BSPLAT GLND 30 STRL LF SHLDR (Shoulder) ×1 IMPLANT
COOLER ICEMAN CLASSIC (MISCELLANEOUS) IMPLANT
COVER BACK TABLE 60X90IN (DRAPES) ×3 IMPLANT
COVER SURGICAL LIGHT HANDLE (MISCELLANEOUS) ×3 IMPLANT
DRAPE INCISE IOBAN 66X45 STRL (DRAPES) ×3 IMPLANT
DRAPE ORTHO SPLIT 77X108 STRL (DRAPES) ×4
DRAPE POUCH INSTRU U-SHP 10X18 (DRAPES) ×3 IMPLANT
DRAPE SHEET LG 3/4 BI-LAMINATE (DRAPES) ×3 IMPLANT
DRAPE SURG 17X11 SM STRL (DRAPES) ×3 IMPLANT
DRAPE SURG ORHT 6 SPLT 77X108 (DRAPES) ×4 IMPLANT
DRAPE TOP 10253 STERILE (DRAPES) ×3 IMPLANT
DRAPE U-SHAPE 47X51 STRL (DRAPES) ×3 IMPLANT
DRSG AQUACEL AG ADV 3.5X 6 (GAUZE/BANDAGES/DRESSINGS) ×3 IMPLANT
DURAPREP 26ML APPLICATOR (WOUND CARE) ×6 IMPLANT
ELECT BLADE TIP CTD 4 INCH (ELECTRODE) ×3 IMPLANT
ELECT REM PT RETURN 15FT ADLT (MISCELLANEOUS) ×3 IMPLANT
GLOVE SRG 8 PF TXTR STRL LF DI (GLOVE) ×2 IMPLANT
GLOVE SURG ENC MOIS LTX SZ7.5 (GLOVE) ×3 IMPLANT
GLOVE SURG POLYISO LF SZ6.5 (GLOVE) ×3 IMPLANT
GLOVE SURG UNDER POLY LF SZ6.5 (GLOVE) ×3 IMPLANT
GLOVE SURG UNDER POLY LF SZ8 (GLOVE) ×2
GOWN STRL REUS W/TWL LRG LVL3 (GOWN DISPOSABLE) ×3 IMPLANT
GOWN STRL REUS W/TWL XL LVL3 (GOWN DISPOSABLE) ×3 IMPLANT
HANDPIECE INTERPULSE COAX TIP (DISPOSABLE) ×2
HOOD PEEL AWAY FLYTE STAYCOOL (MISCELLANEOUS) ×9 IMPLANT
HUMERA STEM SM SHELL SHOU 10 (Miscellaneous) ×2 IMPLANT
INSERT SMALL SOCKET 32MM NEU (Insert) ×1 IMPLANT
KIT BASIN OR (CUSTOM PROCEDURE TRAY) ×3 IMPLANT
KIT TURNOVER KIT A (KITS) ×1 IMPLANT
MANIFOLD NEPTUNE II (INSTRUMENTS) ×3 IMPLANT
NDL TROCAR POINT SZ 2 1/2 (NEEDLE) IMPLANT
NEEDLE TROCAR POINT SZ 2 1/2 (NEEDLE) IMPLANT
NS IRRIG 1000ML POUR BTL (IV SOLUTION) ×3 IMPLANT
P2 COATDE GLNOID BSEPLT 6.5X30 (Shoulder) ×2 IMPLANT
PACK SHOULDER (CUSTOM PROCEDURE TRAY) ×3 IMPLANT
PAD COLD SHLDR WRAP-ON (PAD) IMPLANT
PROTECTOR NERVE ULNAR (MISCELLANEOUS) IMPLANT
RESTRAINT HEAD UNIVERSAL NS (MISCELLANEOUS) ×1 IMPLANT
RETRIEVER SUT HEWSON (MISCELLANEOUS) IMPLANT
SCREW BONE LOCKING RSP 5.0X14 (Screw) ×4 IMPLANT
SCREW BONE LOCKING RSP 5.0X30 (Screw) ×2 IMPLANT
SCREW BONE RSP LOCK 5X14 (Screw) IMPLANT
SCREW BONE RSP LOCK 5X26 (Screw) IMPLANT
SCREW BONE RSP LOCK 5X30 (Screw) IMPLANT
SCREW BONE RSP LOCKING 5.0X26 (Screw) ×2 IMPLANT
SCREW RETAIN W/HEAD 4MM OFFSET (Shoulder) ×1 IMPLANT
SET HNDPC FAN SPRY TIP SCT (DISPOSABLE) ×2 IMPLANT
SLING ARM IMMOBILIZER LRG (SOFTGOODS) IMPLANT
SLING ARM IMMOBILIZER MED (SOFTGOODS) ×1 IMPLANT
SPONGE T-LAP 18X18 ~~LOC~~+RFID (SPONGE) ×3 IMPLANT
SPONGE T-LAP 4X18 ~~LOC~~+RFID (SPONGE) ×3 IMPLANT
STEM HUMERAL SM SHELL SHOU 10 (Miscellaneous) IMPLANT
STRIP CLOSURE SKIN 1/2X4 (GAUZE/BANDAGES/DRESSINGS) ×6 IMPLANT
SUCTION FRAZIER HANDLE 10FR (MISCELLANEOUS)
SUCTION TUBE FRAZIER 10FR DISP (MISCELLANEOUS) IMPLANT
SUPPORT WRAP ARM LG (MISCELLANEOUS) ×1 IMPLANT
SUT ETHIBOND 2 V 37 (SUTURE) IMPLANT
SUT FIBERWIRE #2 38 REV NDL BL (SUTURE) ×4
SUT MNCRL AB 4-0 PS2 18 (SUTURE) ×3 IMPLANT
SUT VIC AB 2-0 CT1 27 (SUTURE) ×2
SUT VIC AB 2-0 CT1 TAPERPNT 27 (SUTURE) ×2 IMPLANT
SUTURE FIBERWR#2 38 REV NDL BL (SUTURE) IMPLANT
TAPE LABRALWHITE 1.5X36 (TAPE) IMPLANT
TAPE SUT LABRALTAP WHT/BLK (SUTURE) IMPLANT
TOWEL OR 17X26 10 PK STRL BLUE (TOWEL DISPOSABLE) ×3 IMPLANT
TOWEL OR NON WOVEN STRL DISP B (DISPOSABLE) ×3 IMPLANT
WATER STERILE IRR 1000ML POUR (IV SOLUTION) ×3 IMPLANT

## 2022-02-26 NOTE — Anesthesia Postprocedure Evaluation (Signed)
Anesthesia Post Note ? ?Patient: Destiny Brown ? ?Procedure(s) Performed: REVERSE SHOULDER ARTHROPLASTY (Right: Shoulder) ? ?  ? ?Patient location during evaluation: PACU ?Anesthesia Type: General ?Level of consciousness: awake and alert ?Pain management: pain level controlled ?Vital Signs Assessment: post-procedure vital signs reviewed and stable ?Respiratory status: spontaneous breathing, nonlabored ventilation, respiratory function stable and patient connected to nasal cannula oxygen ?Cardiovascular status: blood pressure returned to baseline and stable ?Postop Assessment: no apparent nausea or vomiting ?Anesthetic complications: no ? ? ?No notable events documented. ? ?Last Vitals:  ?Vitals:  ? 02/26/22 1134 02/26/22 1145  ?BP: (!) 150/62 (!) 155/58  ?Pulse: 91 78  ?Resp: 15 14  ?Temp: 36.8 ?C   ?SpO2: 98% 98%  ?  ?Last Pain:  ?Vitals:  ? 02/26/22 1145  ?TempSrc:   ?PainSc: Asleep  ? ? ?  ?  ?  ?  ?  ?  ? ?Maryalice Pasley S ? ? ? ? ?

## 2022-02-26 NOTE — Evaluation (Signed)
Occupational Therapy Evaluation ?Patient Details ?Name: Destiny Brown ?MRN: [id number] ?DOB: 1946/06/18 ?Today's Date: 02/26/2022 ? ? ?History of Present Illness Patient is a 76 year old female s/p R reverse total shoulder arthroplasty. PMH: TKA, multiple back surgeries  ? ?Clinical Impression ?  ?Patient is a 76 year old female s/p shoulder replacement without functional use of right dominant upper extremity secondary to effects of surgery and interscalene block and shoulder precautions. Therapist provided education and instruction to patient and spouse in regards to exercises, precautions, positioning, donning upper extremity clothing and bathing while maintaining shoulder precautions, ice and edema management and donning/doffing sling. Patient and spouse verbalized understanding and demonstrated as needed. Patient needed assistance to donn shirt, underwear, pants, socks and shoes and provided with instruction on compensatory strategies to perform ADLs. Patient to follow up with MD for further therapy needs.  ?  ?   ? ?Recommendations for follow up therapy are one component of a multi-disciplinary discharge planning process, led by the attending physician.  Recommendations may be updated based on patient status, additional functional criteria and insurance authorization.  ? ?Follow Up Recommendations ? Follow physician's recommendations for discharge plan and follow up therapies  ?  ?Assistance Recommended at Discharge Intermittent Supervision/Assistance  ?Patient can return home with the following A little help with bathing/dressing/bathroom ? ?  ?   ?Equipment Recommendations ? None recommended by OT  ?  ?   ?Precautions / Restrictions Precautions ?Precautions: Shoulder ?Type of Shoulder Precautions: A/PROM shoulder NO, AROM elbow. wrist, hand ok ?Shoulder Interventions: Shoulder sling/immobilizer;Off for dressing/bathing/exercises ?Precaution Booklet Issued: Yes (comment) ?Required Braces or Orthoses:  Sling ?Restrictions ?Weight Bearing Restrictions: Yes ?RUE Weight Bearing: Non weight bearing  ? ?  ? ?   ?Balance Overall balance assessment: Independent ?  ?  ?  ?  ?  ?  ?  ?  ?  ?  ?  ?  ?  ?  ?  ?  ?  ?  ?   ? ?ADL either performed or assessed with clinical judgement  ? ?ADL Overall ADL's : Needs assistance/impaired ?  ?  ?Grooming: Set up;Sitting ?  ?Upper Body Bathing: Minimal assistance;Sitting;Standing ?  ?Lower Body Bathing: Minimal assistance;Sit to/from stand ?  ?Upper Body Dressing : Minimal assistance;Standing ?Upper Body Dressing Details (indicate cue type and reason): Assist to thread R UE ?Lower Body Dressing: Minimal assistance;Sit to/from stand ?Lower Body Dressing Details (indicate cue type and reason): Assist to pull up pants on R side ?Toilet Transfer: Independent ?  ?Toileting- Clothing Manipulation and Hygiene: Minimal assistance;Sit to/from stand ?  ?  ?  ?Functional mobility during ADLs: Independent ?General ADL Comments: Patient and spouse educated in shoulder precautions in order to maintain during self care tasks.  ? ? ? ? ?Pertinent Vitals/Pain Pain Assessment ?Pain Assessment: Faces ?Faces Pain Scale: Hurts a little bit ?Pain Location: R UE ?Pain Descriptors / Indicators: Heaviness, Numbness ?Pain Intervention(s): Monitored during session  ? ? ? ?Hand Dominance Right ?  ?Extremity/Trunk Assessment Upper Extremity Assessment ?Upper Extremity Assessment: RUE deficits/detail ?RUE Deficits / Details: + nerve block ?  ?Lower Extremity Assessment ?Lower Extremity Assessment: Overall WFL for tasks assessed ?  ?  ?  ?Communication Communication ?Communication: No difficulties ?  ?Cognition Arousal/Alertness: Awake/alert ?Behavior During Therapy: [facility] for tasks assessed/performed ?Overall Cognitive Status: Within Functional Limits for tasks assessed ?  ?  ?  ?  ?  ?  ?  ?  ?  ?  ?  ?  ?  ?  ?  ?  ?  ?  ?  ?   ?  Exercises Exercises: Shoulder ?  ?Shoulder Instructions Shoulder  Instructions ?Donning/doffing shirt without moving shoulder: Minimal assistance;Caregiver independent with task;Patient able to independently direct caregiver ?Method for sponge bathing under operated UE: Minimal assistance;Caregiver independent with task;Patient able to independently direct caregiver ?Donning/doffing sling/immobilizer: Moderate assistance;Caregiver independent with task;Patient able to independently direct caregiver ?Correct positioning of sling/immobilizer: Independent;Caregiver independent with task;Patient able to independently direct caregiver ?ROM for elbow, wrist and digits of operated UE: Caregiver independent with task;Patient able to independently direct caregiver ?Sling wearing schedule (on at all times/off for ADL's): Caregiver independent with task;Patient able to independently direct caregiver ?Proper positioning of operated UE when showering: Caregiver independent with task;Patient able to independently direct caregiver ?Positioning of UE while sleeping: Caregiver independent with task;Patient able to independently direct caregiver  ? ? ?Home Living Family/patient expects to be discharged to:: Private residence ?Living Arrangements: Spouse/significant other ?Available Help at Discharge: Family ?Type of Home: House ?Home Access: Stairs to enter ?Entrance Stairs-Number of Steps: 3 ?Entrance Stairs-Rails: Right;Left ?Home Layout: One level ?  ?  ?Bathroom Shower/Tub: Tub/shower unit ?  ?Bathroom Toilet: Handicapped height ?  ?  ?Home Equipment: Agricultural consultant (2 wheels) ?  ?  ?  ? ?  ?Prior Functioning/Environment Prior Level of Function : Independent/Modified Independent ?  ?  ?  ?  ?  ?  ?  ?  ?  ? ?  ?  ?OT Problem List: Pain;Impaired UE functional use;Decreased knowledge of precautions ?  ?   ?   ?OT Goals(Current goals can be found in the care plan section) Acute Rehab OT Goals ?Patient Stated Goal: Home with spouse ?OT Goal Formulation: All assessment and education complete, DC  therapy  ? ?AM-PAC OT "6 Clicks" Daily Activity     ?Outcome Measure Help from another person eating meals?: None ?Help from another person taking care of personal grooming?: A Little ?Help from another person toileting, which includes using toliet, bedpan, or urinal?: A Little ?Help from another person bathing (including washing, rinsing, drying)?: A Little ?Help from another person to put on and taking off regular upper body clothing?: A Little ?Help from another person to put on and taking off regular lower body clothing?: A Little ?6 Click Score: 19 ?  ?End of Session Equipment Utilized During Treatment: Other (comment) (sling) ?Nurse Communication: Other (comment) (OT complete) ? ?Activity Tolerance: Patient tolerated treatment well ?Patient left: in chair;with call bell/phone within reach;with family/visitor present ? ?OT Visit Diagnosis: Pain ?Pain - Right/Left: Right ?Pain - part of body: Shoulder  ?              ?Time: 4270-6237 ?OT Time Calculation (min): 23 min ?Charges:  OT General Charges ?$OT Visit: 1 Visit ?OT Evaluation ?$OT Eval Low Complexity: 1 Low ?OT Treatments ?$Self Care/Home Management : 8-22 mins ? ?Marlyce Huge OT ?OT pager: [phone] ? ? ?Carmelia Roller ?02/26/2022, 2:49 PM ?

## 2022-02-26 NOTE — Transfer of Care (Signed)
Immediate Anesthesia Transfer of Care Note ? ?Patient: Destiny Brown ? ?Procedure(s) Performed: REVERSE SHOULDER ARTHROPLASTY (Right: Shoulder) ? ?Patient Location: PACU ? ?Anesthesia Type:GA combined with regional for post-op pain ? ?Level of Consciousness: awake, alert  and patient cooperative ? ?Airway & Oxygen Therapy: Patient Spontanous Breathing and Patient connected to face mask oxygen ? ?Post-op Assessment: Report given to RN and Post -op Vital signs reviewed and stable ? ?Post vital signs: Reviewed and stable ? ?Last Vitals:  ?Vitals Value Taken Time  ?BP 150/62 02/26/22 1134  ?Temp    ?Pulse 86 02/26/22 1135  ?Resp 13 02/26/22 1135  ?SpO2 99 % 02/26/22 1135  ?Vitals shown include unvalidated device data. ? ?Last Pain:  ?Vitals:  ? 02/26/22 0805  ?TempSrc:   ?PainSc: 0-No pain  ?   ? ?  ? ?Complications: No notable events documented. ?

## 2022-02-26 NOTE — Anesthesia Preprocedure Evaluation (Signed)
Anesthesia Evaluation  ?Patient identified by MRN, date of birth, ID band ?Patient awake ? ? ? ?Reviewed: ?Allergy & Precautions, NPO status , Patient's Chart, lab work & pertinent test results ? ?History of Anesthesia Complications ?(+) PONV and history of anesthetic complications ? ?Airway ?Mallampati: II ? ?TM Distance: >3 FB ?Neck ROM: Full ? ? ? Dental ?no notable dental hx. ? ?  ?Pulmonary ?sleep apnea and Continuous Positive Airway Pressure Ventilation ,  ?  ?Pulmonary exam normal ?breath sounds clear to auscultation ? ? ? ? ? ? Cardiovascular ?hypertension, Normal cardiovascular exam+ dysrhythmias Atrial Fibrillation  ?Rhythm:Regular Rate:Normal ? ? ?  ?Neuro/Psych ?negative neurological ROS ? negative psych ROS  ? GI/Hepatic ?negative GI ROS, Neg liver ROS,   ?Endo/Other  ?diabetes, Type 2Hypothyroidism  ? Renal/GU ?negative Renal ROS  ?negative genitourinary ?  ?Musculoskeletal ?negative musculoskeletal ROS ?(+)  ? Abdominal ?  ?Peds ?negative pediatric ROS ?(+)  Hematology ?negative hematology ROS ?(+)   ?Anesthesia Other Findings ? ? Reproductive/Obstetrics ?negative OB ROS ? ?  ? ? ? ? ? ? ? ? ? ? ? ? ? ?  ?  ? ? ? ? ? ? ? ? ?Anesthesia Physical ?Anesthesia Plan ? ?ASA: 3 ? ?Anesthesia Plan: General  ? ?Post-op Pain Management: Regional block*  ? ?Induction: Intravenous ? ?PONV Risk Score and Plan: 4 or greater and Ondansetron, Dexamethasone and Treatment may vary due to age or medical condition ? ?Airway Management Planned: Oral ETT ? ?Additional Equipment:  ? ?Intra-op Plan:  ? ?Post-operative Plan: Extubation in OR ? ?Informed Consent: I have reviewed the patients History and Physical, chart, labs and discussed the procedure including the risks, benefits and alternatives for the proposed anesthesia with the patient or authorized representative who has indicated his/her understanding and acceptance.  ? ? ? ?Dental advisory given ? ?Plan Discussed with: CRNA and  Surgeon ? ?Anesthesia Plan Comments:   ? ? ? ? ? ? ?Anesthesia Quick Evaluation ? ?

## 2022-02-26 NOTE — Progress Notes (Signed)
Assisted Dr. Rose with right, ultrasound guided, interscalene  block. Side rails up, monitors on throughout procedure. See vital signs in flow sheet. Tolerated Procedure well. 

## 2022-02-26 NOTE — Anesthesia Procedure Notes (Signed)
Procedure Name: Intubation ?Date/Time: 02/26/2022 9:59 AM ?Performed by: Gerald Leitz, CRNA ?Pre-anesthesia Checklist: Patient identified, Patient being monitored, Timeout performed, Emergency Drugs available and Suction available ?Patient Re-evaluated:Patient Re-evaluated prior to induction ?Oxygen Delivery Method: Circle system utilized ?Preoxygenation: Pre-oxygenation with 100% oxygen ?Induction Type: IV induction ?Ventilation: Mask ventilation without difficulty ?Laryngoscope Size: Mac and 3 ?Grade View: Grade I ?Tube type: Oral ?Tube size: 7.0 mm ?Number of attempts: 1 ?Placement Confirmation: ETT inserted through vocal cords under direct vision, positive ETCO2 and breath sounds checked- equal and bilateral ?Secured at: 22 cm ?Tube secured with: Tape ?Dental Injury: Teeth and Oropharynx as per pre-operative assessment  ? ? ? ? ?

## 2022-02-26 NOTE — Anesthesia Procedure Notes (Signed)
Anesthesia Procedure Image    

## 2022-02-26 NOTE — H&P (Signed)
Destiny Brown is an 76 y.o. female.   ?Chief Complaint: R shoulder pain and weakness   ?HPI: s/p fall >6 mo ago with ongoing pain and weakness with RCT, failed extensive conservative treatement. ? ?Past Medical History:  ?Diagnosis Date  ? Arthritis   ? Atrial fibrillation with RVR (HCC) 09/21/2014  ? Bronchitis   ? h/o  ? Diabetes mellitus   ? NO MEDS,  DIET CONTROLLED  ? History of migraines   ? "when working under alot of stress"  ? Hypertension   ? Hypothyroid   ? LBBB (left bundle branch block) 09/22/2014  ? OSA (obstructive sleep apnea) 04/09/2015  ? Moderate OSA with AHI 16.7 events per hour with successful CPAP titration to 8cm H2O  ? PONV (postoperative nausea and vomiting)   ? PT DOESNOT WANT TO TAKE HER ATENOLOL     DOS.   " IT DROPS MY BP TO LOW"  ? ? ?Past Surgical History:  ?Procedure Laterality Date  ? ABDOMINAL HYSTERECTOMY  03/2001  ? APPENDECTOMY    ? BACK SURGERY    ? CATARACT EXTRACTION, BILATERAL    ? WITH IMPLANTS  ? CERVICAL FUSION  08/2003  ? CHOLECYSTECTOMY  03/2010  ? EYE SURGERY    ? LUMBAR DISC SURGERY    ? LUMBAR LAMINECTOMY/DECOMPRESSION MICRODISCECTOMY  10/2010  ? POSTERIOR FUSION LUMBAR SPINE  12/16/11; 12/2005; 07/2004  ? L2-3; L3-4; L4-5  ? TOTAL KNEE ARTHROPLASTY Right 03/18/2017  ? Procedure: RIGHT TOTAL KNEE ARTHROPLASTY;  Surgeon: Eugenia Mcalpine, MD;  Location: [facility];  Service: Orthopedics;  Laterality: Right;  ? ? ?Family History  ?Adopted: Yes  ?Family history unknown: Yes  ? ?Social History:  reports that she has never smoked. She has never used smokeless tobacco. She reports that she does not drink alcohol and does not use drugs. ? ?Allergies:  ?Allergies  ?Allergen Reactions  ? Codeine Other (See Comments)  ?  Severe Headaches with codeine; but not with other opioids  ? Percocet [Oxycodone-Acetaminophen] Other (See Comments)  ?  hallucinations  ? ? ?Medications Prior to Admission  ?Medication Sig Dispense Refill  ? ACCU-CHEK FASTCLIX LANCETS MISC 1 each by Other route 2 (two)  times daily. Use 1 lancet bid    ? ACCU-CHEK SMARTVIEW test strip 1 strip by Other route 2 (two) times daily. Use 1 strip to check glucose twice a day    ? amLODipine (NORVASC) 5 MG tablet Take 5 mg by mouth daily.    ? apixaban (ELIQUIS) 5 MG TABS tablet Take 1 tablet (5 mg total) by mouth 2 (two) times daily. 180 tablet 1  ? Cholecalciferol (VITAMIN D3) 1000 UNITS CAPS Take 1,000 Units by mouth daily.    ? hydrochlorothiazide (HYDRODIURIL) 25 MG tablet Take 25 mg by mouth daily.    ? latanoprost (XALATAN) 0.005 % ophthalmic solution Place 1 drop into both eyes in the morning.    ? levothyroxine (SYNTHROID, LEVOTHROID) 125 MCG tablet Take 125 mcg by mouth daily.    ? losartan (COZAAR) 100 MG tablet Take 100 mg by mouth daily.    ? metFORMIN (GLUCOPHAGE) 1000 MG tablet Take 1,000 mg by mouth 2 (two) times daily.    ? rosuvastatin (CRESTOR) 5 MG tablet Take 5 mg by mouth 2 (two) times a week.    ? ? ?Results for orders placed or performed during the hospital encounter of 02/26/22 (from the past 48 hour(s))  ?Glucose, capillary     Status: Abnormal  ? Collection Time:  02/26/22  7:45 AM  ?Result Value Ref Range  ? Glucose-Capillary 125 (H) 70 - 99 mg/dL  ?  Comment: Glucose reference range applies only to samples taken after fasting for at least 8 hours.  ? Comment 1 Notify RN   ? Comment 2 Document in Chart   ?Type and screen Order type and screen if day of surgery is less than 15 days from draw of preadmission visit or order morning of surgery if day of surgery is greater than 6 days from preadmission visit.     Status: None (Preliminary result)  ? Collection Time: 02/26/22  7:59 AM  ?Result Value Ref Range  ? ABO/RH(D) PENDING   ? Antibody Screen PENDING   ? Sample Expiration    ?  03/01/2022,2359 ?Performed at [facility], [address] [place], [place] [address] ?  ? ?No results found. ? ?Review of Systems  ?All other systems reviewed and are negative. ? ?Blood pressure (!) 158/65, pulse 66,  temperature 98.3 ?F (36.8 ?C), temperature source Oral, resp. rate 18, height 5\' 6"  (1.676 m), weight 81.3 kg, SpO2 99 %. ?Physical Exam ?HENT:  ?   Head: Atraumatic.  ?Eyes:  ?   Extraocular Movements: Extraocular movements intact.  ?Cardiovascular:  ?   Pulses: Normal pulses.  ?Pulmonary:  ?   Breath sounds: Normal breath sounds.  ?Musculoskeletal:  ?   Comments: R shoulder pain and weakness with RC testing.  ?Neurological:  ?   Mental Status: She is alert.  ?Psychiatric:     ?   Mood and Affect: Mood normal.  ?  ? ?Assessment/Plan ?s/p fall >6 mo ago with ongoing pain and weakness with RCT, failed extensive conservative treatement. ?Plan R reverse TSA ?Risks / benefits of surgery discussed ?Consent on chart  ?NPO for OR ?Preop antibiotics ? ? ? , MD ?02/26/2022, 8:49 AM ? ? ? ?

## 2022-02-26 NOTE — Discharge Instructions (Addendum)

## 2022-02-26 NOTE — Op Note (Signed)
Procedure(s): ?REVERSE SHOULDER ARTHROPLASTY Procedure Note ? ?Destiny Brown ?female ?76 y.o. ?02/26/2022 ? ? 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 ?Postoperative diagnosis: Same ? ?Procedure(s) and Anesthesia Type: ?   * REVERSE SHOULDER ARTHROPLASTY - Choice ? ? ?Indications:  76 y.o. female status post fall greater than 6 months ago with presumed rotator cuff tear.  Unable to get an MRI.  She had persistent dysfunction with weakness and ongoing severe pain limiting her sleep and quality of life.  Ultimately indicated for reverse total shoulder arthroplasty to try and improve function and decrease pain. ?    ?Surgeon: Rhae Hammock  ? ?Assistants: Forensic psychologist was present and scrubbed throughout the procedure and was essential in positioning, retraction, exposure, and closure) ? ?Anesthesia: General endotracheal anesthesia with preoperative interscalene block given by the attending anesthesiologist ? ? ? ?Procedure Detail ? ?REVERSE SHOULDER ARTHROPLASTY ? ? ?Estimated Blood Loss:  200 mL ?        ?Drains: none ? ?Blood Given: none  ?        ?Specimens: none ?       ?Complications:  * No complications entered in OR log * ?        ?Disposition: PACU - hemodynamically stable. ?        ?Condition: stable ?   ? ? ?OPERATIVE FINDINGS:  ?A DJO Altivate pressfit reverse total shoulder arthroplasty was placed with a  ?size 10 stem, a 32-4 glenosphere, and a standard-mm poly insert. The base plate  ?fixation was good. ? ?PROCEDURE: The patient was identified in the preoperative holding area  ?where I personally marked the operative site after verifying site, side,  ?and procedure with the patient. An interscalene block given by  ?the attending anesthesiologist in the holding area and the patient was taken back to the operating room where all extremities were  ?carefully padded in position after general anesthesia was induced. She  ?was placed in a beach-chair  position and the operative upper extremity was  ?prepped and draped in a standard sterile fashion. An approximately 10-  ?cm incision was made from the tip of the coracoid process to the center  ?point of the humerus at the level of the axilla. Dissection was carried  ?down through subcutaneous tissues to the level of the cephalic vein  ?which was taken laterally with the deltoid. The pectoralis major was  ?retracted medially. The subdeltoid space was developed and the lateral  ?edge of the conjoined tendon was identified. The undersurface of  ?conjoined tendon was palpated and the musculocutaneous nerve was not in  ?the field. Retractor was placed underneath the conjoined and second  ?retractor was placed lateral into the deltoid. The circumflex humeral  ?artery and vessels were identified and clamped and coagulated. The  ?biceps tendon was tenotomized.  The subscapularis was taken down as a peel with the underlying capsule.  The  ?joint was then gently externally rotated while the capsule was released  ?from the humeral neck around to just beyond the 6 o'clock position. At  ?this point, the joint was dislocated and the humeral head was presented  ?into the wound. The excessive osteophyte formation was removed with a  ?large rongeur.  The cutting guide was used to make the appropriate  ?head cut and the head was saved for potentially bone grafting.  The glenoid was exposed with the arm in an  ?abducted extended position. The anterior and posterior labrum were  ?completely  excised and the capsule was released circumferentially to  ?allow for exposure of the glenoid for preparation. The 2.5 mm drill was  ?placed using the guide in 5-10? inferior angulation and the tap was then advanced in the same hole. Small and large reamers were then used. The tap was then removed and the Metaglene was then screwed in with good purchase.  The peripheral guide was then used to drilled measured and filled peripheral locking screws.  The size 32-4 glenosphere was then impacted on the [facility] taper and the central screw was placed. The humerus was then again exposed and the diaphyseal reamers were used followed by the metaphyseal reamers. The final broach was left in place in the proximal trial was placed. The joint was reduced and with this implant it was felt that soft tissue tensioning was appropriate with excellent stability and excellent range of motion. Therefore, final humeral stem was placed press-fit.  And then the trial polyethylene inserts were tested again and the above implant was felt to be the most appropriate for final insertion. The joint was reduced taken through full range of motion and felt to be stable. Soft tissue tension was appropriate.  ?The joint was then copiously irrigated with pulse  ?lavage and the wound was then closed. The subscapularis was repaired loosely with 1 FiberWire suture.  Skin was closed with 2-0 Vicryl in a deep dermal layer and 4-0  ?Monocryl for skin closure. Steri-Strips were applied. Sterile  ?dressings were then applied as well as a sling. The patient was allowed  ?to awaken from general anesthesia, transferred to stretcher, and taken  ?to recovery room in stable condition.  ? ?POSTOPERATIVE PLAN: The patient will be observed in the recovery room and if her pain is well controlled with the regional anesthetic and she is hemodynamically stable she could be discharged home today with family. ?

## 2022-02-26 NOTE — Anesthesia Procedure Notes (Signed)
Anesthesia Regional Block: Interscalene brachial plexus block  ? ?Pre-Anesthetic Checklist: , timeout performed,  Correct Patient, Correct Site, Correct Laterality,  Correct Procedure, Correct Position, site marked,  Risks and benefits discussed,  Surgical consent,  Pre-op evaluation,  At surgeon's request and post-op pain management ? ?Laterality: Right ? ?Prep: chloraprep     ?  ?Needles:  ?Injection technique: Single-shot ? ?Needle Type: Echogenic Needle   ? ? ?Needle Length: 9cm  ? ? ? ? ?Additional Needles: ? ? ?Procedures:,,,, ultrasound used (permanent image in chart),,    ?Narrative:  ?Start time: 02/26/2022 8:50 AM ?End time: 02/26/2022 8:58 AM ?Injection made incrementally with aspirations every 5 mL. ? ?Performed by: Personally  ?Anesthesiologist: Eilene Ghazi, MD ? ?Additional Notes: ?Patient tolerated the procedure well without complications ? ? ? ?

## 2022-02-27 LAB — GLUCOSE, CAPILLARY: Glucose-Capillary: 179 mg/dL — ABNORMAL HIGH (ref 70–99)

## 2022-03-02 ENCOUNTER — Encounter (HOSPITAL_COMMUNITY): Payer: Self-pay | Admitting: Orthopedic Surgery

## 2022-05-21 ENCOUNTER — Other Ambulatory Visit: Payer: Self-pay | Admitting: Endocrinology

## 2022-05-21 DIAGNOSIS — Z1231 Encounter for screening mammogram for malignant neoplasm of breast: Secondary | ICD-10-CM

## 2022-07-02 ENCOUNTER — Ambulatory Visit
Admission: RE | Admit: 2022-07-02 | Discharge: 2022-07-02 | Disposition: A | Discharge status: Home / Self Care | Payer: Medicare Other | Source: Ambulatory Visit | Attending: Endocrinology | Admitting: Endocrinology

## 2022-07-02 DIAGNOSIS — Z1231 Encounter for screening mammogram for malignant neoplasm of breast: Secondary | ICD-10-CM

## 2022-11-21 ENCOUNTER — Ambulatory Visit (HOSPITAL_COMMUNITY)
Admission: EM | Admit: 2022-11-21 | Discharge: 2022-11-21 | Disposition: A | Discharge status: Home / Self Care | Payer: Medicare Other | Attending: Emergency Medicine | Admitting: Emergency Medicine

## 2022-11-21 ENCOUNTER — Encounter (HOSPITAL_COMMUNITY): Payer: Self-pay

## 2022-11-21 DIAGNOSIS — J45901 Unspecified asthma with (acute) exacerbation: Secondary | ICD-10-CM | POA: Diagnosis not present

## 2022-11-21 DIAGNOSIS — H109 Unspecified conjunctivitis: Secondary | ICD-10-CM | POA: Diagnosis not present

## 2022-11-21 DIAGNOSIS — J302 Other seasonal allergic rhinitis: Secondary | ICD-10-CM

## 2022-11-21 DIAGNOSIS — B9689 Other specified bacterial agents as the cause of diseases classified elsewhere: Secondary | ICD-10-CM | POA: Diagnosis not present

## 2022-11-21 DIAGNOSIS — J0100 Acute maxillary sinusitis, unspecified: Secondary | ICD-10-CM

## 2022-11-21 DIAGNOSIS — J309 Allergic rhinitis, unspecified: Secondary | ICD-10-CM

## 2022-11-21 MED ORDER — ALBUTEROL SULFATE HFA 108 (90 BASE) MCG/ACT IN AERS: 2.0000 | INHALATION_SPRAY | Freq: Four times a day (QID) | RESPIRATORY_TRACT | 2 refills | Status: AC | PRN

## 2022-11-21 MED ORDER — ALBUTEROL SULFATE (2.5 MG/3ML) 0.083% IN NEBU
INHALATION_SOLUTION | RESPIRATORY_TRACT | Status: AC
  Filled 2022-11-21: qty 3

## 2022-11-21 MED ORDER — OLOPATADINE HCL 0.2 % OP SOLN: 1.0000 [drp] | Freq: Every day | OPHTHALMIC | 1 refills | Status: DC

## 2022-11-21 MED ORDER — CIPROFLOXACIN HCL 0.3 % OP SOLN: OPHTHALMIC | 0 refills | Status: DC

## 2022-11-21 MED ORDER — ALBUTEROL SULFATE (2.5 MG/3ML) 0.083% IN NEBU
2.5000 mg | INHALATION_SOLUTION | Freq: Once | RESPIRATORY_TRACT | Status: AC
  Administered 2022-11-21: 2.5 mg via RESPIRATORY_TRACT

## 2022-11-21 MED ORDER — GUAIFENESIN 400 MG PO TABS: ORAL_TABLET | ORAL | 0 refills | Status: DC

## 2022-11-21 MED ORDER — FLUTICASONE PROPIONATE 50 MCG/ACT NA SUSP: 1.0000 | Freq: Every day | NASAL | 2 refills | Status: DC

## 2022-11-21 MED ORDER — AMOXICILLIN-POT CLAVULANATE 875-125 MG PO TABS: 1.0000 | ORAL_TABLET | Freq: Two times a day (BID) | ORAL | 0 refills | Status: AC

## 2022-11-21 MED ORDER — FEXOFENADINE HCL 180 MG PO TABS: 180.0000 mg | ORAL_TABLET | Freq: Every day | ORAL | 2 refills | Status: DC

## 2022-11-21 MED ORDER — TRIAMCINOLONE ACETONIDE 40 MG/ML IJ SUSP
INTRAMUSCULAR | Status: AC
  Filled 2022-11-21: qty 1

## 2022-11-21 MED ORDER — PROMETHAZINE-DM 6.25-15 MG/5ML PO SYRP: 5.0000 mL | ORAL_SOLUTION | Freq: Every evening | ORAL | 0 refills | Status: DC | PRN

## 2022-11-21 MED ORDER — IPRATROPIUM BROMIDE 0.06 % NA SOLN: 2.0000 | Freq: Three times a day (TID) | NASAL | 1 refills | Status: DC

## 2022-11-21 MED ORDER — AEROCHAMBER PLUS FLO-VU LARGE MISC: 1.0000 | Freq: Once | 0 refills | Status: AC

## 2022-11-21 MED ORDER — METHYLPREDNISOLONE 4 MG PO TBPK: ORAL_TABLET | ORAL | 0 refills | Status: DC

## 2022-11-21 MED ORDER — TRIAMCINOLONE ACETONIDE 40 MG/ML IJ SUSP
40.0000 mg | Freq: Once | INTRAMUSCULAR | Status: AC
  Administered 2022-11-21: 40 mg via INTRAMUSCULAR

## 2022-11-21 NOTE — Discharge Instructions (Addendum)
Please read below to learn more about the medications, dosages and frequencies that I recommend to help alleviate your symptoms and to get you feeling better soon:   Augmentin (amoxicillin - clavulanic acid): To treat what is most likely bacterial sinusitis, please take 1 tablet twice daily for 7 days, you can take it with or without food.  This antibiotic can cause upset stomach, this will resolve once antibiotics are complete.  You are welcome to use a probiotic, eat yogurt, take Imodium while taking this medication.  Please avoid other systemic medications such as Maalox, Pepto-Bismol or milk of magnesia as they can interfere with your body's ability to absorb the antibiotics.  Ciloxan (ciprofloxacin): To treat the infection and inflammation of both of your eyes, please instill 1 drop into the affected eye(s) every 2 hours while awake for days 1 and 2 then instill 1 drop into the affected eye(s) every 4 hours while awake for days 3 through 7.  Please be sure that you finish the full 7-day treatment.  Pataday (olopatadine): This is an antihistamine eyedrop that can be used once daily to help relieve dry eyes, itchy eyes and red eyes.  This antihistamine drop not only works for allergic conjunctivitis but is also very helpful with viral conjunctivitis.  Please do not use this drop more than once a day, for best relief please use this in the morning.        Your symptoms and my physical exam findings are concerning for exacerbation of your underlying allergies.   To avoid catching frequent respiratory infections, having skin reactions, dealing with eye irritation, losing sleep, missing work, etc., due to uncontrolled allergies, it is important that you begin/continue your allergy regimen and are consistent with taking your meds exactly as prescribed.   Kenalog IM (triamcinolone):  To quickly address your significant respiratory inflammation, you were provided with an injection of Kenalog in the office  today.  You should continue to feel the full benefit of the steroid for the next 24-36 hours.    Medrol Dosepak (methylprednisolone): This is a steroid that will significantly calm your upper and lower airways, please take one full row of tablets daily with your breakfast meal starting tomorrow morning until the prescription is complete.      ProAir, Ventolin, Proventil (albuterol): This inhaled medication contains a short acting beta agonist bronchodilator.  This medication works on the smooth muscle that opens and constricts of your airways by relaxing the muscle.  The result of relaxation of the smooth muscle is increased air movement and improved work of breathing.  This is a short acting medication that can be used every 4-6 hours as needed for increased work of breathing, shortness of breath, wheezing and excessive coughing.  I would like for you to inhale 2 puffs 4 times daily for the next 3 days using the spacer provided.    Robitussin, Mucinex (guaifenesin): This is an expectorant.  This helps break up chest congestion and loosen up thick nasal drainage making phlegm and drainage more liquid and therefore easier to remove.  I recommend aching 400 mg three times daily as needed.      Promethazine DM: Promethazine is both a nasal decongestant and an antinausea medication that makes most patients feel fairly sleepy.  The DM is dextromethorphan, a cough suppressant found in many over-the-counter cough medications.  Please take 5 mL before bedtime to minimize your cough which will help you sleep better.     Atrovent (ipratropium): This is  an excellent nasal decongestant spray I have added to your recommended nasal steroid that will not cause rebound congestion, please instill 2 sprays into each nare with each use.  Because nasal steroids can take several days before they begin to provide full benefit, I recommend that you use this spray in addition to the nasal steroid prescribed for you.  Please use  it after you have used your nasal steroid and repeat up to 4 times daily as needed.        Flonase (fluticasone): This is a steroid nasal spray that you use once daily, 1 spray in each nare.  This medication does not work well if you decide to use it only used as you feel you need to, it works best used on a daily basis.  After 3 to 5 days of use, you will notice significant reduction of the inflammation and mucus production that is currently being caused by exposure to allergens, whether seasonal or environmental.  The most common side effect of this medication is nosebleeds.  If you experience a nosebleed, please discontinue use for 1 week, then feel free to resume.  I have provided you with a prescription.  I recommend that you stay on this medication throughout the winter and to decrease the risk of future respiratory infections.   Allegra (fexofenadine): This is an excellent second-generation antihistamine that helps to reduce respiratory inflammatory response to environmental allergens.  This medication is not known to cause daytime sleepiness so it can be taken in the daytime.  If you find that it does make you sleepy, please feel free to take it at bedtime.  I recommend that you stay on this medication throughout the winter to decrease your risk of future respiratory infections.   If you find that you have not had evidence improvement of your symptoms in the next 3 days, please follow-up with your primary care provider or Come to [facility] at [place] for repeat evaluation and further recommendations.  Thank you for visiting urgent care today.  We appreciate the opportunity to participate in your care.  If you find that your health insurance will not pay for allergy medications, please consider downloading the GoodRx app and using to get a better price than the "off the shelf" price.        Other options for anticoagulation in patients with atrial fibrillation include  Xarelto, Pradaxa, Savaysa and Lixiana.  Please speak with your doctor about patient assistance with these other medications to see if you can reduce your monthly cost below $155.

## 2022-11-21 NOTE — ED Provider Notes (Addendum)
[facility]    CSN: [id number] Arrival date & time: 11/21/22  1005    HISTORY   Chief Complaint  Patient presents with   Cough   Nasal Congestion   Fever   HPI Destiny Brown is a pleasant, 76 y.o. female who presents to urgent care today. Patient complains of a cough and congestion that began on November 17.  Patient states has been taking over-the-counter medication with no relief.  Patient states she typically gets bronchitis this time of year but has been a few years since last time she had it.  Patient reports a history of seasonal allergies but not currently take any allergy medication.  The history is provided by the patient.   Past Medical History:  Diagnosis Date   Arthritis    Atrial fibrillation with RVR (HCC) 09/21/2014   Bronchitis    h/o   Diabetes mellitus    NO MEDS,  DIET CONTROLLED   History of migraines    "when working under alot of stress"   Hypertension    Hypothyroid    LBBB (left bundle branch block) 09/22/2014   OSA (obstructive sleep apnea) 04/09/2015   Moderate OSA with AHI 16.7 events per hour with successful CPAP titration to 8cm H2O   PONV (postoperative nausea and vomiting)    PT DOESNOT WANT TO TAKE HER ATENOLOL     DOS.   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   Past Surgical History:  Procedure Laterality Date   ABDOMINAL HYSTERECTOMY  03/2001   APPENDECTOMY     BACK SURGERY     CATARACT EXTRACTION, BILATERAL     WITH IMPLANTS   CERVICAL FUSION  08/2003    CHOLECYSTECTOMY  03/2010   EYE SURGERY     LUMBAR DISC SURGERY     LUMBAR LAMINECTOMY/DECOMPRESSION MICRODISCECTOMY  10/2010   POSTERIOR FUSION LUMBAR SPINE  12/16/11; 12/2005; 07/2004   L2-3; L3-4; L4-5   REVERSE SHOULDER ARTHROPLASTY Right 02/26/2022   Procedure: REVERSE SHOULDER ARTHROPLASTY;  Surgeon: Jones Broom, MD;  Location: [facility];  Service: Orthopedics;  Laterality: Right;   TOTAL KNEE ARTHROPLASTY Right 03/18/2017   Procedure: RIGHT TOTAL KNEE ARTHROPLASTY;  Surgeon: Eugenia Mcalpine, MD;  Location: [facility];  Service: Orthopedics;  Laterality: Right;   OB History   No obstetric history on file.    Home Medications    Prior to Admission medications   Medication Sig Start Date End Date Taking? Authorizing Provider  albuterol (VENTOLIN HFA) 108 (90 Base) MCG/ACT inhaler Inhale 2 puffs into the lungs every 6 (six) hours as needed for wheezing or shortness of breath (Cough). 11/21/22  Yes Theadora Rama Scales, PA-C  amoxicillin-clavulanate (AUGMENTIN) 875-125 MG tablet Take 1 tablet by mouth 2 (two) times daily for 7 days. 11/21/22 11/28/22 Yes Theadora Rama Scales, PA-C  ciprofloxacin (CILOXAN) 0.3 % ophthalmic solution Administer 1 drop, every 2 hours, while awake, for 2 days. Then 1 drop, every 4 hours, while awake, for the next 5 days. 11/21/22  Yes Lequita Halt,  Rebecka Oelkers Scales, PA-C  fexofenadine (ALLEGRA) 180 MG tablet Take 1 tablet (180 mg total) by mouth daily. 11/21/22 02/19/23 Yes Theadora Rama Scales, PA-C  fluticasone (FLONASE) 50 MCG/ACT nasal spray Place 1 spray into both nostrils daily. Begin by using 2 sprays in each nare daily for 3 to 5 days, then decrease to 1 spray in each nare daily. 11/21/22  Yes Theadora Rama Scales, PA-C  guaifenesin (HUMIBID E) 400 MG TABS tablet Take 1 tablet 3 times daily as needed for chest congestion and cough 11/21/22  Yes Theadora Rama Scales, PA-C  ipratropium (ATROVENT) 0.06 % nasal spray Place 2 sprays into both nostrils 3 (three) times  daily. As needed for nasal congestion, runny nose 11/21/22  Yes Theadora Rama Scales, PA-C  methylPREDNISolone (MEDROL DOSEPAK) 4 MG TBPK tablet Take 24 mg on day 1, 20 mg on day 2, 16 mg on day 3, 12 mg on day 4, 8 mg on day 5, 4 mg on day 6.  Take all tablets in each row at once, do not spread tablets out throughout the day. 11/21/22  Yes Theadora Rama Scales, PA-C  Olopatadine HCl (PATADAY) 0.2 % SOLN Apply 1 drop to eye daily. 11/21/22  Yes Theadora Rama Scales, PA-C  promethazine-dextromethorphan (PROMETHAZINE-DM) 6.25-15 MG/5ML syrup Take 5 mLs by mouth at bedtime as needed for cough. 11/21/22  Yes Theadora Rama Scales, PA-C  Spacer/Aero-Holding Chambers (AEROCHAMBER PLUS FLO-VU LARGE) MISC 1 each by Other route once for 1 dose. 11/21/22 11/21/22 Yes Theadora Rama Scales, PA-C  ACCU-CHEK FASTCLIX LANCETS MISC 1 each by Other route 2 (two) times daily. Use 1 lancet bid 10/21/15   [provider]  ACCU-CHEK SMARTVIEW test strip 1 strip by Other route 2 (two) times daily. Use 1 strip to check glucose twice a day 10/21/15   [provider]  amLODipine (NORVASC) 5 MG tablet Take 5 mg by mouth daily. 12/14/16   [provider]  apixaban (ELIQUIS) 5 MG TABS tablet Take 1 tablet (5 mg total) by mouth 2 (two) times daily. 01/03/15   Duke Salvia, MD  Cholecalciferol (VITAMIN D3) 1000 UNITS CAPS Take 1,000 Units by mouth daily.    [provider]  hydrochlorothiazide (HYDRODIURIL) 25 MG tablet Take 25 mg by mouth daily.    [provider]  latanoprost (XALATAN) 0.005 % ophthalmic solution Place 1 drop into both eyes in the morning.    [provider]  levothyroxine (SYNTHROID, LEVOTHROID) 125 MCG tablet Take 125 mcg by mouth daily.    [provider]  losartan (COZAAR) 100 MG tablet Take 100 mg by mouth daily.    [provider]  metFORMIN (GLUCOPHAGE) 1000 MG tablet Take 1,000 mg by mouth 2 (two) times daily.    [provider]  rosuvastatin (CRESTOR) 5 MG tablet Take 5 mg by mouth 2 (two) times a week.    [provider]    Family History Family History  Adopted: Yes  Family history unknown: Yes   Social History Social History   Tobacco Use   Smoking status: Never   Smokeless tobacco: Never  Vaping Use   Vaping Use: Never used  Substance Use Topics   Alcohol use: No   Drug use: No   Allergies   Codeine and Percocet [oxycodone-acetaminophen]  Review of Systems Review of Systems Pertinent findings revealed after performing a 14 point review of systems has been noted in the history of present illness.  Physical Exam Triage Vital Signs ED Triage Vitals  Enc Vitals Group     BP 10/24/21 0827 (!) 147/82     Pulse Rate 10/24/21 0827 72     Resp 10/24/21 0827 18     Temp 10/24/21 0827 98.3 F (36.8 C)     Temp Source 10/24/21 0827 Oral     SpO2 10/24/21 0827 98 %     Weight --      Height --      Head Circumference --      Peak Flow --      Pain Score 10/24/21 0826 5     Pain Loc --      Pain Edu? --      Excl. in GC? --   No data found.  Updated Vital Signs BP 123/73 (BP Location: Left Arm)   Pulse 82   Temp 98.7 F (37.1 C) (Oral)   Resp 18   SpO2 99%   Physical Exam Vitals and nursing note reviewed.  Constitutional:      General: She is not in acute distress.    Appearance: Normal appearance. She is not ill-appearing.  HENT:     Head: Normocephalic and atraumatic.     Salivary Glands: Right salivary gland is not diffusely enlarged or tender. Left salivary gland is not diffusely enlarged or tender.     Right Ear: Ear canal and external ear normal. No drainage. A middle ear effusion is present. There is no impacted cerumen. Tympanic membrane is bulging. Tympanic membrane is not injected or erythematous.     Left Ear: Ear canal and external ear normal. No drainage. A middle ear effusion is present. There is no impacted cerumen. Tympanic membrane is bulging.  Tympanic membrane is not injected or erythematous.     Ears:     Comments: Bilateral EACs normal, both TMs bulging with clear fluid    Nose: Rhinorrhea present. No nasal deformity, septal deviation, signs of injury, nasal tenderness, mucosal edema or congestion. Rhinorrhea is clear.     Right Nostril: Occlusion present. No foreign body, epistaxis or septal hematoma.     Left Nostril: Occlusion present. No foreign body, epistaxis or septal hematoma.     Right Turbinates: Enlarged, swollen and pale.     Left Turbinates: Enlarged, swollen and pale.     Right Sinus: No maxillary sinus tenderness or frontal sinus tenderness.     Left Sinus: No maxillary sinus tenderness or frontal sinus tenderness.     Mouth/Throat:     Lips: Pink. No lesions.     Mouth: Mucous membranes are moist. No oral lesions.     Pharynx: Oropharynx is clear. Uvula midline. No posterior oropharyngeal erythema or uvula swelling.     Tonsils: No tonsillar exudate. 0 on the right. 0 on the left.     Comments: Postnasal drip Eyes:     General:        Right eye: No discharge.        Left eye: No discharge.     Extraocular Movements: Extraocular movements intact.     Conjunctiva/sclera:     Right eye: Right conjunctiva is injected. Exudate present. No chemosis or hemorrhage.    Left eye: Left conjunctiva is injected. Exudate present. No chemosis or hemorrhage.    Pupils: Pupils are equal, round, and reactive to light.     Comments: Bilateral upper eyelids with erythema without scale or swelling  Neck:     Trachea: Trachea and phonation normal.  Cardiovascular:     Rate and Rhythm:  Normal rate and regular rhythm.     Pulses: Normal pulses.     Heart sounds: Normal heart sounds. No murmur heard.    No friction rub. No gallop.  Pulmonary:     Effort: Pulmonary effort is normal. No tachypnea, bradypnea, accessory muscle usage, prolonged expiration, respiratory distress or retractions.     Breath sounds: No stridor,  decreased air movement or transmitted upper airway sounds. Examination of the right-upper field reveals decreased breath sounds. Examination of the left-upper field reveals decreased breath sounds. Examination of the right-middle field reveals decreased breath sounds. Examination of the left-middle field reveals decreased breath sounds. Examination of the right-lower field reveals decreased breath sounds. Examination of the left-lower field reveals decreased breath sounds. Decreased breath sounds present. No wheezing, rhonchi or rales.     Comments: Repeat auscultation post nebulized bronchodilator revealed more audible breath sounds and mild wheezing in right middle lung fields Chest:     Chest wall: No tenderness.  Musculoskeletal:        General: Normal range of motion.     Cervical back: Normal range of motion and neck supple. Normal range of motion.  Lymphadenopathy:     Cervical: No cervical adenopathy.  Skin:    General: Skin is warm and dry.     Findings: No erythema or rash.  Neurological:     General: No focal deficit present.     Mental Status: She is alert and oriented to person, place, and time.  Psychiatric:        Mood and Affect: Mood normal.        Behavior: Behavior normal.     Visual Acuity Right Eye Distance:   Left Eye Distance:   Bilateral Distance:    Right Eye Near:   Left Eye Near:    Bilateral Near:     UC Couse / Diagnostics / Procedures:     Radiology No results found.  Procedures Procedures (including critical care time) EKG  Pending results:  Labs Reviewed - No data to display  Medications Ordered in UC: Medications  albuterol (PROVENTIL) (2.5 MG/3ML) 0.083% nebulizer solution 2.5 mg (2.5 mg Nebulization Given 11/21/22 1110)  triamcinolone acetonide (KENALOG-40) injection 40 mg (40 mg Intramuscular Given 11/21/22 1141)    UC Diagnoses / Final Clinical Impressions(s)   I have reviewed the triage vital signs and the nursing  notes.  Pertinent labs & imaging results that were available during my care of the patient were reviewed by me and considered in my medical decision making (see chart for details).    Final diagnoses:  Bacterial conjunctivitis of both eyes  Bronchitis, allergic, unspecified asthma severity, with acute exacerbation  Acute maxillary sinusitis, recurrence not specified  Allergic rhinitis with postnasal drip  Seasonal allergies   Patient provided with a Kenalog injection to continue to improve work of breathing and address wheezing appreciated on exam.  Patient provided with allergy medications given annual recurrence of bronchitis and upper respiratory symptoms this time of year.  Patient advised to begin tapering dose of methylprednisolone along with Augmentin for presumed bacterial sinusitis.  Patient advised to use albuterol 4 times daily and again as needed for cough and shortness of breath.  Patient also advised to take guaifenesin in the daytime to promote mucus production and Promethazine DM for nighttime so she can get some sleep.  Return precautions advised.  ED Prescriptions     Medication Sig Dispense Auth. Provider   fexofenadine (ALLEGRA) 180 MG tablet Take 1 tablet (  180 mg total) by mouth daily. 30 tablet Theadora Rama Scales, PA-C   ipratropium (ATROVENT) 0.06 % nasal spray Place 2 sprays into both nostrils 3 (three) times daily. As needed for nasal congestion, runny nose 15 mL Theadora Rama Scales, PA-C   fluticasone (FLONASE) 50 MCG/ACT nasal spray Place 1 spray into both nostrils daily. Begin by using 2 sprays in each nare daily for 3 to 5 days, then decrease to 1 spray in each nare daily. 15.8 mL Theadora Rama Scales, PA-C   amoxicillin-clavulanate (AUGMENTIN) 875-125 MG tablet Take 1 tablet by mouth 2 (two) times daily for 7 days. 14 tablet Theadora Rama Scales, PA-C   albuterol (VENTOLIN HFA) 108 (90 Base) MCG/ACT inhaler Inhale 2 puffs into the lungs every 6 (six) hours  as needed for wheezing or shortness of breath (Cough). 18 g Theadora Rama Scales, PA-C   methylPREDNISolone (MEDROL DOSEPAK) 4 MG TBPK tablet Take 24 mg on day 1, 20 mg on day 2, 16 mg on day 3, 12 mg on day 4, 8 mg on day 5, 4 mg on day 6.  Take all tablets in each row at once, do not spread tablets out throughout the day. 21 tablet Theadora Rama Scales, PA-C   guaifenesin (HUMIBID E) 400 MG TABS tablet Take 1 tablet 3 times daily as needed for chest congestion and cough 21 tablet Theadora Rama Scales, PA-C   promethazine-dextromethorphan (PROMETHAZINE-DM) 6.25-15 MG/5ML syrup Take 5 mLs by mouth at bedtime as needed for cough. 60 mL Theadora Rama Scales, PA-C   Olopatadine HCl (PATADAY) 0.2 % SOLN Apply 1 drop to eye daily. 2.5 mL Theadora Rama Scales, PA-C   ciprofloxacin (CILOXAN) 0.3 % ophthalmic solution Administer 1 drop, every 2 hours, while awake, for 2 days. Then 1 drop, every 4 hours, while awake, for the next 5 days. 5 mL Theadora Rama Scales, PA-C   Spacer/Aero-Holding Chambers (AEROCHAMBER PLUS FLO-VU LARGE) MISC 1 each by Other route once for 1 dose. 1 each Theadora Rama Scales, PA-C      PDMP not reviewed this encounter.  Disposition Upon Discharge:  Condition: stable for discharge home Home: take medications as prescribed; routine discharge instructions as discussed; follow up as advised.  Patient presented with an acute illness with associated systemic symptoms and significant discomfort requiring urgent management. In my opinion, this is a condition that a prudent lay person (someone who possesses an average knowledge of health and medicine) may potentially expect to result in complications if not addressed urgently such as respiratory distress, impairment of bodily function or dysfunction of bodily organs.   Routine symptom specific, illness specific and/or disease specific instructions were discussed with the patient and/or caregiver at length.   As such, the  patient has been evaluated and assessed, work-up was performed and treatment was provided in alignment with urgent care protocols and evidence based medicine.  Patient/parent/caregiver has been advised that the patient may require follow up for further testing and treatment if the symptoms continue in spite of treatment, as clinically indicated and appropriate.  If the patient was tested for COVID-19, Influenza and/or RSV, then the patient/parent/guardian was advised to isolate at home pending the results of his/her diagnostic coronavirus test and potentially longer if they're positive. I have also advised pt that if his/her COVID-19 test returns positive, it's recommended to self-isolate for at least 10 days after symptoms first appeared AND until fever-free for 24 hours without fever reducer AND other symptoms have improved or resolved. Discussed self-isolation recommendations as  well as instructions for household member/close contacts as per the CDC and [place] DHHS, and also gave patient the COVID packet with this information.  Patient/parent/caregiver has been advised to return to the [facility] or PCP in 3-5 days if no better; to PCP or the Emergency Department if new signs and symptoms develop, or if the current signs or symptoms continue to change or worsen for further workup, evaluation and treatment as clinically indicated and appropriate  The patient will follow up with their current PCP if and as advised. If the patient does not currently have a PCP we will assist them in obtaining one.   The patient may need specialty follow up if the symptoms continue, in spite of conservative treatment and management, for further workup, evaluation, consultation and treatment as clinically indicated and appropriate.  Patient/parent/caregiver verbalized understanding and agreement of plan as discussed.  All questions were addressed during visit.  Please see discharge instructions below for further details of  plan.  Discharge Instructions:   Discharge Instructions      Please read below to learn more about the medications, dosages and frequencies that I recommend to help alleviate your symptoms and to get you feeling better soon:   Augmentin (amoxicillin - clavulanic acid): To treat what is most likely bacterial sinusitis, please take 1 tablet twice daily for 7 days, you can take it with or without food.  This antibiotic can cause upset stomach, this will resolve once antibiotics are complete.  You are welcome to use a probiotic, eat yogurt, take Imodium while taking this medication.  Please avoid other systemic medications such as Maalox, Pepto-Bismol or milk of magnesia as they can interfere with your body's ability to absorb the antibiotics.  Ciloxan (ciprofloxacin): To treat the infection and inflammation of both of your eyes, please instill 1 drop into the affected eye(s) every 2 hours while awake for days 1 and 2 then instill 1 drop into the affected eye(s) every 4 hours while awake for days 3 through 7.  Please be sure that you finish the full 7-day treatment.  Pataday (olopatadine): This is an antihistamine eyedrop that can be used once daily to help relieve dry eyes, itchy eyes and red eyes.  This antihistamine drop not only works for allergic conjunctivitis but is also very helpful with viral conjunctivitis.  Please do not use this drop more than once a day, for best relief please use this in the morning.        Your symptoms and my physical exam findings are concerning for exacerbation of your underlying allergies.   To avoid catching frequent respiratory infections, having skin reactions, dealing with eye irritation, losing sleep, missing work, etc., due to uncontrolled allergies, it is important that you begin/continue your allergy regimen and are consistent with taking your meds exactly as prescribed.   Kenalog IM (triamcinolone):  To quickly address your significant respiratory  inflammation, you were provided with an injection of Kenalog in the office today.  You should continue to feel the full benefit of the steroid for the next 24-36 hours.    Medrol Dosepak (methylprednisolone): This is a steroid that will significantly calm your upper and lower airways, please take one full row of tablets daily with your breakfast meal starting tomorrow morning until the prescription is complete.      ProAir, Ventolin, Proventil (albuterol): This inhaled medication contains a short acting beta agonist bronchodilator.  This medication works on the smooth muscle that opens and constricts of your airways  by relaxing the muscle.  The result of relaxation of the smooth muscle is increased air movement and improved work of breathing.  This is a short acting medication that can be used every 4-6 hours as needed for increased work of breathing, shortness of breath, wheezing and excessive coughing.  I would like for you to inhale 2 puffs 4 times daily for the next 3 days using the spacer provided.    Robitussin, Mucinex (guaifenesin): This is an expectorant.  This helps break up chest congestion and loosen up thick nasal drainage making phlegm and drainage more liquid and therefore easier to remove.  I recommend aching 400 mg three times daily as needed.      Promethazine DM: Promethazine is both a nasal decongestant and an antinausea medication that makes most patients feel fairly sleepy.  The DM is dextromethorphan, a cough suppressant found in many over-the-counter cough medications.  Please take 5 mL before bedtime to minimize your cough which will help you sleep better.     Atrovent (ipratropium): This is an excellent nasal decongestant spray I have added to your recommended nasal steroid that will not cause rebound congestion, please instill 2 sprays into each nare with each use.  Because nasal steroids can take several days before they begin to provide full benefit, I recommend that you use  this spray in addition to the nasal steroid prescribed for you.  Please use it after you have used your nasal steroid and repeat up to 4 times daily as needed.        Flonase (fluticasone): This is a steroid nasal spray that you use once daily, 1 spray in each nare.  This medication does not work well if you decide to use it only used as you feel you need to, it works best used on a daily basis.  After 3 to 5 days of use, you will notice significant reduction of the inflammation and mucus production that is currently being caused by exposure to allergens, whether seasonal or environmental.  The most common side effect of this medication is nosebleeds.  If you experience a nosebleed, please discontinue use for 1 week, then feel free to resume.  I have provided you with a prescription.  I recommend that you stay on this medication throughout the winter and to decrease the risk of future respiratory infections.   Allegra (fexofenadine): This is an excellent second-generation antihistamine that helps to reduce respiratory inflammatory response to environmental allergens.  This medication is not known to cause daytime sleepiness so it can be taken in the daytime.  If you find that it does make you sleepy, please feel free to take it at bedtime.  I recommend that you stay on this medication throughout the winter to decrease your risk of future respiratory infections.   If you find that you have not had evidence improvement of your symptoms in the next 3 days, please follow-up with your primary care provider or Come to [facility] at [address] for repeat evaluation and further recommendations.  Thank you for visiting urgent care today.  We appreciate the opportunity to participate in your care.  If you find that your health insurance will not pay for allergy medications, please consider downloading the GoodRx app and using to get a better price than the "off the shelf" price.         Other options for anticoagulation in patients with atrial fibrillation include Xarelto, Pradaxa, Savaysa and Lixiana.  Please speak with your doctor about patient assistance with these other medications to see if you can reduce your monthly cost below $155.    This office note has been dictated using Teaching laboratory technician.  Unfortunately, this method of dictation can sometimes lead to typographical or grammatical errors.  I apologize for your inconvenience in advance if this occurs.  Please do not hesitate to reach out to me if clarification is needed.      Theadora Rama Scales, PA-C 11/21/22 1453    Theadora Rama Scales, PA-C 11/24/22 1003    Theadora Rama [place], [place] 11/24/22 1004

## 2022-11-21 NOTE — ED Triage Notes (Signed)
Pt reports cough and congestion that started on 11/17. She has been taking OTC medication with no relief.

## 2022-11-24 ENCOUNTER — Ambulatory Visit
Admission: EM | Admit: 2022-11-24 | Discharge: 2022-11-24 | Disposition: A | Discharge status: Home / Self Care | Payer: Medicare Other

## 2022-11-24 DIAGNOSIS — J209 Acute bronchitis, unspecified: Secondary | ICD-10-CM

## 2022-11-24 NOTE — Discharge Instructions (Signed)
Please continue all of your current medications as prescribed.  At this point, I think time in patients are going to be the key to resolution of this case of bronchitis.  Please do follow-up in the next 3 to 5 days if you feel your symptoms are getting worse and not better.  Otherwise, deep breathe, sing and do your best to go about your normal activities.  Thank you for visiting [place] here at urgent care.

## 2022-11-24 NOTE — ED Triage Notes (Signed)
Pt states cough and congestion for the past week. Seen here for the same last week. Has been taking prescribed medications with a little relief.

## 2022-11-24 NOTE — ED Provider Notes (Signed)
[facility]    CSN: [id number] Arrival date & time: 11/24/22  [id number]    HISTORY   Chief Complaint  Patient presents with   Cough   HPI Destiny Brown is a pleasant, 76 y.o. female who presents to urgent care today. Patient returns for follow-up of bronchitis.  Patient was seen by me at another location on November 25.  Patient states she has had a little relief of her bronchitis, states she is coughing less frequently but cough is still worse at night, particularly if she goes out at night such as to choir practice or church meetings.  Patient states cough is not productive at this point.  Patient states that she no longer has significant drainage from her eyes and the redness has resolved as well.  Patient reports decrease in rhinorrhea.  States she continues to use the albuterol inhaler 3 times daily.  The history is provided by the patient.   Past Medical History:  Diagnosis Date   Arthritis    Atrial fibrillation with RVR (HCC) 09/21/2014   Bronchitis    h/o   Diabetes mellitus    NO MEDS,  DIET CONTROLLED   History of migraines    "when working under alot of stress"   Hypertension    Hypothyroid    LBBB (left bundle branch block) 09/22/2014   OSA (obstructive sleep apnea) 04/09/2015   Moderate OSA with AHI 16.7 events per hour with successful CPAP titration to 8cm H2O   PONV (postoperative nausea and vomiting)    PT DOESNOT WANT TO TAKE HER ATENOLOL     DOS.   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   Past  Surgical History:  Procedure Laterality Date   ABDOMINAL HYSTERECTOMY  03/2001   APPENDECTOMY     BACK SURGERY     CATARACT EXTRACTION, BILATERAL     WITH IMPLANTS   CERVICAL FUSION  08/2003   CHOLECYSTECTOMY  03/2010   EYE SURGERY     LUMBAR DISC SURGERY     LUMBAR LAMINECTOMY/DECOMPRESSION MICRODISCECTOMY  10/2010   POSTERIOR FUSION LUMBAR SPINE  12/16/11; 12/2005; 07/2004   L2-3; L3-4; L4-5   REVERSE SHOULDER ARTHROPLASTY Right 02/26/2022   Procedure: REVERSE SHOULDER ARTHROPLASTY;  Surgeon: Jones Broomhandler, Justin, MD;  Location: [facility];  Service: Orthopedics;  Laterality: Right;   TOTAL KNEE ARTHROPLASTY Right 03/18/2017   Procedure: RIGHT TOTAL KNEE ARTHROPLASTY;  Surgeon: Eugenia Mcalpineobert Collins, MD;  Location: [facility];  Service: Orthopedics;  Laterality: Right;   OB History   No obstetric history on file.    Home Medications    Prior to Admission medications   Medication Sig Start Date End Date Taking? Authorizing Provider  ACCU-CHEK FASTCLIX LANCETS MISC 1 each by Other route 2 (two) times daily. Use 1 lancet bid 10/21/15   [provider]  ACCU-CHEK SMARTVIEW test strip 1 strip by Other route 2 (two) times daily. Use 1 strip to check glucose twice a day 10/21/15   [provider]  albuterol (VENTOLIN HFA)  108 (90 Base) MCG/ACT inhaler Inhale 2 puffs into the lungs every 6 (six) hours as needed for wheezing or shortness of breath (Cough). 11/21/22   Theadora Rama Scales, PA-C  amLODipine (NORVASC) 5 MG tablet Take 5 mg by mouth daily. 12/14/16   [provider]  amoxicillin-clavulanate (AUGMENTIN) 875-125 MG tablet Take 1 tablet by mouth 2 (two) times daily for 7 days. 11/21/22 11/28/22  Theadora Rama Scales, PA-C  apixaban (ELIQUIS) 5 MG TABS tablet Take 1 tablet (5 mg total) by mouth 2 (two) times daily. 01/03/15   Duke Salvia, MD  Cholecalciferol (VITAMIN D3) 1000 UNITS CAPS Take 1,000 Units by mouth daily.    [provider]  ciprofloxacin (CILOXAN) 0.3  % ophthalmic solution Administer 1 drop, every 2 hours, while awake, for 2 days. Then 1 drop, every 4 hours, while awake, for the next 5 days. 11/21/22   Theadora Rama Scales, PA-C  fexofenadine (ALLEGRA) 180 MG tablet Take 1 tablet (180 mg total) by mouth daily. 11/21/22 02/19/23  Theadora Rama Scales, PA-C  fluticasone (FLONASE) 50 MCG/ACT nasal spray Place 1 spray into both nostrils daily. Begin by using 2 sprays in each nare daily for 3 to 5 days, then decrease to 1 spray in each nare daily. 11/21/22   Theadora Rama Scales, PA-C  guaifenesin (HUMIBID E) 400 MG TABS tablet Take 1 tablet 3 times daily as needed for chest congestion and cough 11/21/22   Theadora Rama Scales, PA-C  hydrochlorothiazide (HYDRODIURIL) 25 MG tablet Take 25 mg by mouth daily.    [provider]  ipratropium (ATROVENT) 0.06 % nasal spray Place 2 sprays into both nostrils 3 (three) times daily. As needed for nasal congestion, runny nose 11/21/22   Theadora Rama Scales, PA-C  latanoprost (XALATAN) 0.005 % ophthalmic solution Place 1 drop into both eyes in the morning.    [provider]  levothyroxine (SYNTHROID, LEVOTHROID) 125 MCG tablet Take 125 mcg by mouth daily.    [provider]  losartan (COZAAR) 100 MG tablet Take 100 mg by mouth daily.    [provider]  metFORMIN (GLUCOPHAGE) 1000 MG tablet Take 1,000 mg by mouth 2 (two) times daily.    [provider]  methylPREDNISolone (MEDROL DOSEPAK) 4 MG TBPK tablet Take 24 mg on day 1, 20 mg on day 2, 16 mg on day 3, 12 mg on day 4, 8 mg on day 5, 4 mg on day 6.  Take all tablets in each row at once, do not spread tablets out throughout the day. 11/21/22   Theadora Rama Scales, PA-C  Olopatadine HCl (PATADAY) 0.2 % SOLN Apply 1 drop to eye daily. 11/21/22   Theadora Rama Scales, PA-C  promethazine-dextromethorphan (PROMETHAZINE-DM) 6.25-15 MG/5ML syrup Take 5 mLs by mouth at bedtime as needed for cough. 11/21/22    Theadora Rama Scales, PA-C  rosuvastatin (CRESTOR) 5 MG tablet Take 5 mg by mouth 2 (two) times a week.    [provider]    Family History Family History  Adopted: Yes  Family history unknown: Yes   Social History Social History   Tobacco Use   Smoking status: Never   Smokeless tobacco: Never  Vaping Use   Vaping Use: Never used  Substance Use Topics   Alcohol use: No   Drug use: No   Allergies   Codeine and Percocet [oxycodone-acetaminophen]  Review of Systems Review of Systems Pertinent findings revealed after performing a 14 point review of systems has been noted in  the history of present illness.  Physical Exam Triage Vital Signs ED Triage Vitals  Enc Vitals Group     BP 10/24/21 0827 (!) 147/82     Pulse Rate 10/24/21 0827 72     Resp 10/24/21 0827 18     Temp 10/24/21 0827 98.3 F (36.8 C)     Temp Source 10/24/21 0827 Oral     SpO2 10/24/21 0827 98 %     Weight --      Height --      Head Circumference --      Peak Flow --      Pain Score 10/24/21 0826 5     Pain Loc --      Pain Edu? --      Excl. in GC? --   No data found.  Updated Vital Signs BP (!) 144/70 (BP Location: Left Arm)   Pulse 83   Temp 97.9 F (36.6 C) (Oral)   Resp 16   SpO2 96%   Physical Exam Vitals and nursing note reviewed.  Constitutional:      General: She is awake. She is not in acute distress.    Appearance: Normal appearance. She is well-developed and well-groomed. She is not ill-appearing.  HENT:     Head: Normocephalic and atraumatic.     Salivary Glands: Right salivary gland is not diffusely enlarged or tender. Left salivary gland is not diffusely enlarged or tender.     Right Ear: Hearing, ear canal and external ear normal. No drainage. No middle ear effusion. There is no impacted cerumen. Tympanic membrane is not injected, erythematous or bulging.     Left Ear: Hearing, ear canal and external ear normal. No drainage.  No middle ear effusion. There is  no impacted cerumen. Tympanic membrane is not injected, erythematous or bulging.     Nose: No nasal deformity, septal deviation, signs of injury, nasal tenderness, mucosal edema, congestion or rhinorrhea.     Right Nostril: No foreign body, epistaxis, septal hematoma or occlusion.     Left Nostril: No foreign body, epistaxis, septal hematoma or occlusion.     Right Turbinates: Pale. Not enlarged or swollen.     Left Turbinates: Pale. Not enlarged or swollen.     Right Sinus: No maxillary sinus tenderness or frontal sinus tenderness.     Left Sinus: No maxillary sinus tenderness or frontal sinus tenderness.     Mouth/Throat:     Lips: Pink. No lesions.     Mouth: Mucous membranes are moist. No oral lesions.     Pharynx: Oropharynx is clear. Uvula midline. No pharyngeal swelling, oropharyngeal exudate, posterior oropharyngeal erythema or uvula swelling.     Tonsils: No tonsillar exudate. 0 on the right. 0 on the left.     Comments: Postnasal drip Eyes:     General: Lids are normal.        Right eye: No discharge.        Left eye: No discharge.     Extraocular Movements: Extraocular movements intact.     Conjunctiva/sclera: Conjunctivae normal.     Right eye: Right conjunctiva is not injected.     Left eye: Left conjunctiva is not injected.     Pupils: Pupils are equal, round, and reactive to light.  Neck:     Trachea: Trachea and phonation normal.  Cardiovascular:     Rate and Rhythm: Normal rate and regular rhythm.     Pulses: Normal pulses.     Heart sounds:  Normal heart sounds. No murmur heard.    No friction rub. No gallop.  Pulmonary:     Effort: Pulmonary effort is normal. No tachypnea, bradypnea, accessory muscle usage, prolonged expiration, respiratory distress or retractions.     Breath sounds: Normal breath sounds and air entry. No stridor, decreased air movement or transmitted upper airway sounds. No decreased breath sounds, wheezing, rhonchi or rales.  Chest:     Chest  wall: No tenderness.  Musculoskeletal:        General: Normal range of motion.     Cervical back: Full passive range of motion without pain, normal range of motion and neck supple. Normal range of motion.  Lymphadenopathy:     Cervical: No cervical adenopathy.  Skin:    General: Skin is warm and dry.     Findings: No erythema or rash.  Neurological:     General: No focal deficit present.     Mental Status: She is alert and oriented to person, place, and time.  Psychiatric:        Mood and Affect: Mood normal.        Behavior: Behavior normal. Behavior is cooperative.     Visual Acuity Right Eye Distance:   Left Eye Distance:   Bilateral Distance:    Right Eye Near:   Left Eye Near:    Bilateral Near:     UC Couse / Diagnostics / Procedures:     Radiology No results found.  Procedures Procedures (including critical care time) EKG  Pending results:  Labs Reviewed - No data to display  Medications Ordered in UC: Medications - No data to display  UC Diagnoses / Final Clinical Impressions(s)   I have reviewed the triage vital signs and the nursing notes.  Pertinent labs & imaging results that were available during my care of the patient were reviewed by me and considered in my medical decision making (see chart for details).    Final diagnoses:  Acute bronchitis, unspecified organism   Patient advised to continue current course of care, no changes recommended at this time given her significant progress over the past 3 days.  Return precautions advised.  ED Prescriptions   None    PDMP not reviewed this encounter.  Disposition Upon Discharge:  Condition: stable for discharge home Home: take medications as prescribed; routine discharge instructions as discussed; follow up as advised.  Patient presented with an acute illness with associated systemic symptoms and significant discomfort requiring urgent management. In my opinion, this is a condition that a prudent  lay person (someone who possesses an average knowledge of health and medicine) may potentially expect to result in complications if not addressed urgently such as respiratory distress, impairment of bodily function or dysfunction of bodily organs.   Routine symptom specific, illness specific and/or disease specific instructions were discussed with the patient and/or caregiver at length.   As such, the patient has been evaluated and assessed, work-up was performed and treatment was provided in alignment with urgent care protocols and evidence based medicine.  Patient/parent/caregiver has been advised that the patient may require follow up for further testing and treatment if the symptoms continue in spite of treatment, as clinically indicated and appropriate.  If the patient was tested for COVID-19, Influenza and/or RSV, then the patient/parent/guardian was advised to isolate at home pending the results of his/her diagnostic coronavirus test and potentially longer if they're positive. I have also advised pt that if his/her COVID-19 test returns positive, it's recommended to  self-isolate for at least 10 days after symptoms first appeared AND until fever-free for 24 hours without fever reducer AND other symptoms have improved or resolved. Discussed self-isolation recommendations as well as instructions for household member/close contacts as per the [facility] and [place] DHHS, and also gave patient the COVID packet with this information.  Patient/parent/caregiver has been advised to return to the [facility] or PCP in 3-5 days if no better; to PCP or the Emergency Department if new signs and symptoms develop, or if the current signs or symptoms continue to change or worsen for further workup, evaluation and treatment as clinically indicated and appropriate  The patient will follow up with their current PCP if and as advised. If the patient does not currently have a PCP we will assist them in obtaining one.   The patient may  need specialty follow up if the symptoms continue, in spite of conservative treatment and management, for further workup, evaluation, consultation and treatment as clinically indicated and appropriate.  Patient/parent/caregiver verbalized understanding and agreement of plan as discussed.  All questions were addressed during visit.  Please see discharge instructions below for further details of plan.  Discharge Instructions:   Discharge Instructions      Please continue all of your current medications as prescribed.  At this point, I think time in patients are going to be the key to resolution of this case of bronchitis.  Please do follow-up in the next 3 to 5 days if you feel your symptoms are getting worse and not better.  Otherwise, deep breathe, sing and do your best to go about your normal activities.  Thank you for visiting [place] here at urgent care.    This office note has been dictated using Teaching laboratory technician.  Unfortunately, this method of dictation can sometimes lead to typographical or grammatical errors.  I apologize for your inconvenience in advance if this occurs.  Please do not hesitate to reach out to me if clarification is needed.      Theadora Rama Scales, PA-C 11/24/22 1004

## 2022-12-29 ENCOUNTER — Ambulatory Visit: Payer: Medicare Other | Admitting: Internal Medicine

## 2023-01-16 ENCOUNTER — Encounter: Payer: Self-pay | Admitting: Internal Medicine

## 2023-02-01 LAB — LAB REPORT - SCANNED
A1c: 5.9
Albumin, Urine POC: 4.4
Creatinine, POC: 81 mg/dL
Microalb Creat Ratio: 5

## 2023-02-02 DIAGNOSIS — R002 Palpitations: Secondary | ICD-10-CM | POA: Insufficient documentation

## 2023-02-03 ENCOUNTER — Ambulatory Visit: Payer: Medicare Other | Attending: Internal Medicine | Admitting: Internal Medicine

## 2023-02-03 ENCOUNTER — Encounter: Payer: Self-pay | Admitting: Internal Medicine

## 2023-02-03 VITALS — BP 119/65 | HR 90 | Ht 66.0 in | Wt 184.8 lb

## 2023-02-03 DIAGNOSIS — R0609 Other forms of dyspnea: Secondary | ICD-10-CM

## 2023-02-03 DIAGNOSIS — R072 Precordial pain: Secondary | ICD-10-CM

## 2023-02-03 DIAGNOSIS — R002 Palpitations: Secondary | ICD-10-CM | POA: Diagnosis not present

## 2023-02-03 DIAGNOSIS — I447 Left bundle-branch block, unspecified: Secondary | ICD-10-CM | POA: Diagnosis not present

## 2023-02-03 DIAGNOSIS — I480 Paroxysmal atrial fibrillation: Secondary | ICD-10-CM | POA: Diagnosis not present

## 2023-02-03 DIAGNOSIS — Z01812 Encounter for preprocedural laboratory examination: Secondary | ICD-10-CM

## 2023-02-03 LAB — BASIC METABOLIC PANEL
BUN/Creatinine Ratio: 20 (ref 12–28)
BUN: 26 mg/dL (ref 8–27)
CO2: 19 mmol/L — ABNORMAL LOW (ref 20–29)
Calcium: 9.4 mg/dL (ref 8.7–10.3)
Chloride: 92 mmol/L — ABNORMAL LOW (ref 96–106)
Creatinine, Ser: 1.27 mg/dL — ABNORMAL HIGH (ref 0.57–1.00)
Glucose: 90 mg/dL (ref 70–99)
Potassium: 3.9 mmol/L (ref 3.5–5.2)
Sodium: 130 mmol/L — ABNORMAL LOW (ref 134–144)
eGFR: 43 mL/min/{1.73_m2} — ABNORMAL LOW (ref >59)

## 2023-02-03 MED ORDER — METOPROLOL TARTRATE 100 MG PO TABS: ORAL_TABLET | ORAL | 0 refills | Status: DC

## 2023-02-03 NOTE — Progress Notes (Signed)
Patient Care Team: Reynold Bowen, MD as PCP - General (Endocrinology) Deboraha Sprang, MD as PCP - Cardiology (Cardiology) Clent Jacks, MD as Consulting Physician (Ophthalmology)   HPI  Destiny Brown is a 77 y.o. female Seen in followup for PAF presenting with RVR 9/15   She has LBBB.  HTN DM  EF 55%;  myoview neg for ischemia . Increasing palpitations >> ZIO 3/22>> symptomatic palpitations associated with sinus rhythm frequently but also occasionally with isolated ectopics.  There were a number of episodes of asymptomatic SVT  Has had a cough now for about 4 months.  Pulmonary consultation is pending.  Triggered by flu.  Dyspnea on exertion, sometimes accompanied by chest discomfort more problematic over the last 3 to 4 months.  Occasional swelling.  No nocturnal dyspnea.  No palpitations.  Has had a couple of episodes while standing of nausea lightheadedness described by her husband is diaphoretic and pale.  Resolved with time.  These are new over the last few months.  under a great deal of stress with her husband who got some memory issues and 2 grandsons with whom they live for whom a care 1 of whom has modest functional autism   DATE TEST EF   9/15 Echo  50- 55 %   9/15 MYOVIEW   55 % No ischemia/infarcts                Date Cr K Hgb  3/18 1.19  11.4  6/21  1.07    1/24  1.07 (10/23) 4.0 (10/23) 25.3   Thromboembolic risk factors ( age -39 HTN-1, DM-1, Gender-1) for a CHADSVASc Score >5       Past Medical History:  Diagnosis Date   Arthritis    Atrial fibrillation with RVR ([facility]) 09/21/2014   Bronchitis    h/o   Diabetes mellitus    NO MEDS,  DIET CONTROLLED   History of migraines    "when working under alot of stress"   Hypertension    Hypothyroid    LBBB (left bundle branch block) 09/22/2014   OSA (obstructive sleep apnea) 04/09/2015   Moderate OSA with AHI 16.7 events per hour with successful CPAP titration to 8cm H2O   PONV (postoperative  nausea and vomiting)    PT DOESNOT WANT TO TAKE HER ATENOLOL     DOS.   " IT DROPS MY BP TO LOW"    Past Surgical History:  Procedure Laterality Date   ABDOMINAL HYSTERECTOMY  03/2001   APPENDECTOMY     BACK SURGERY     CATARACT EXTRACTION, BILATERAL     WITH IMPLANTS   CERVICAL FUSION  08/2003   CHOLECYSTECTOMY  03/2010   EYE SURGERY     LUMBAR DISC SURGERY     LUMBAR LAMINECTOMY/DECOMPRESSION MICRODISCECTOMY  10/2010   POSTERIOR FUSION LUMBAR SPINE  12/16/11; 12/2005; 07/2004   L2-3; L3-4; L4-5   REVERSE SHOULDER ARTHROPLASTY Right 02/26/2022   Procedure: REVERSE SHOULDER ARTHROPLASTY;  Surgeon: Tania Ade, MD;  Location: [facility];  Service: Orthopedics;  Laterality: Right;   TOTAL KNEE ARTHROPLASTY Right 03/18/2017   Procedure: RIGHT TOTAL KNEE ARTHROPLASTY;  Surgeon: Sydnee Cabal, MD;  Location: [facility];  Service: Orthopedics;  Laterality: Right;    Current Outpatient Medications  Medication Sig Dispense Refill   ACCU-CHEK FASTCLIX LANCETS MISC 1 each by Other route 2 (two) times daily. Use 1 lancet bid     ACCU-CHEK SMARTVIEW test strip 1 strip by Other route  2 (two) times daily. Use 1 strip to check glucose twice a day     albuterol (VENTOLIN HFA) 108 (90 Base) MCG/ACT inhaler Inhale 2 puffs into the lungs every 6 (six) hours as needed for wheezing or shortness of breath (Cough). 18 g 2   amLODipine (NORVASC) 5 MG tablet Take 5 mg by mouth daily.     apixaban (ELIQUIS) 5 MG TABS tablet Take 1 tablet (5 mg total) by mouth 2 (two) times daily. 180 tablet 1   Cholecalciferol (VITAMIN D3) 1000 UNITS CAPS Take 1,000 Units by mouth daily.     hydrochlorothiazide (HYDRODIURIL) 25 MG tablet Take 25 mg by mouth daily.     levothyroxine (SYNTHROID, LEVOTHROID) 125 MCG tablet Take 125 mcg by mouth daily.     losartan (COZAAR) 100 MG tablet Take 100 mg by mouth daily.     metFORMIN (GLUCOPHAGE) 1000 MG tablet Take 1,000 mg by mouth 2 (two) times daily.     methylPREDNISolone (MEDROL  DOSEPAK) 4 MG TBPK tablet Take 24 mg on day 1, 20 mg on day 2, 16 mg on day 3, 12 mg on day 4, 8 mg on day 5, 4 mg on day 6.  Take all tablets in each row at once, do not spread tablets out throughout the day. 21 tablet 0   rosuvastatin (CRESTOR) 5 MG tablet Take 5 mg by mouth 2 (two) times a week.     ciprofloxacin (CILOXAN) 0.3 % ophthalmic solution Administer 1 drop, every 2 hours, while awake, for 2 days. Then 1 drop, every 4 hours, while awake, for the next 5 days. 5 mL 0   fexofenadine (ALLEGRA) 180 MG tablet Take 1 tablet (180 mg total) by mouth daily. 30 tablet 2   fluticasone (FLONASE) 50 MCG/ACT nasal spray Place 1 spray into both nostrils daily. Begin by using 2 sprays in each nare daily for 3 to 5 days, then decrease to 1 spray in each nare daily. 15.8 mL 2   No current facility-administered medications for this visit.    Allergies  Allergen Reactions   Codeine Other (See Comments)    Severe Headaches with codeine; but not with other opioids   Percocet [Oxycodone-Acetaminophen] Other (See Comments)    hallucinations    Review of Systems negative except from HPI and PMH  Physical Exam BP 122/70   Pulse 77   Ht 5\' 6"  (1.676 m)   Wt 184 lb 12.8 oz (83.8 kg)   SpO2 99%   BMI 29.83 kg/m  Well developed and nourished in no acute distress HENT normal Neck supple with JVP-  flat   Clear Regular rate and rhythm, no murmurs or gallops Abd-soft with active BS No Clubbing cyanosis edema Skin-warm and dry A & Oriented  Grossly normal sensory and motor function  ECG sinus at 88 Intervals 13/14/38 Left bundle branch block  Assessment and  Plan  Paroxysmal atrial fibrillation  Left bundle branch block  Hypertension  Palpitations--with and without ectopy   Exertional dyspnea and chest discomfort  Cough-persistent post influenza  Presyncope  Blood pressure well-controlled.  Continue amlodipine and losartan and HydroDIURIL.  Electrolyte surveillance recent  normal  No significant bleeding, hemoglobin is down a little bit,  Episodes of presyncope accompanied by pallor suggest a neurally mediated event 1 post commode the other with prolonged standing.  Not quite sure why these are emergent at this time although they are being accompanied by chest pain raises the possibility of a Bezold-Jarisch reflex.  She  is also having dyspnea on exertion accompanied by chest discomfort with a positive Levine sign.  Reproducible.  Will undertake coronary CTA as well as echocardiogram in the context of a prolonged left bundle branch block  Not withstanding her orthostatic symptoms, orthostatic vital signs were unrevealing

## 2023-02-03 NOTE — Patient Instructions (Signed)
Medication Instructions:  Your physician recommends that you continue on your current medications as directed. Please refer to the Current Medication list given to you today.  *If you need a refill on your cardiac medications before your next appointment, please call your pharmacy*   Lab Work:  BMET today  If you have labs (blood work) drawn today and your tests are completely normal, you will receive your results only by: [place] (if you have MyChart) OR A paper copy in the mail If you have any lab test that is abnormal or we need to change your treatment, we will call you to review the results.   Testing/Procedures: Your physician has requested that you have cardiac CT. Cardiac computed tomography (CT) is a painless test that uses an x-ray machine to take clear, detailed pictures of your heart. For further information please visit [email or web address]. Please follow instruction sheet as given.  Your physician has requested that you have an echocardiogram. Echocardiography is a painless test that uses sound waves to create images of your heart. It provides your doctor with information about the size and shape of your heart and how well your heart's chambers and valves are working. This procedure takes approximately one hour. There are no restrictions for this procedure. Please do NOT wear cologne, perfume, aftershave, or lotions (deodorant is allowed). Please arrive 15 minutes prior to your appointment time.      Follow-Up: At [facility], you and your health needs are our priority.  As part of our continuing mission to provide you with exceptional heart care, we have created designated Provider Care Teams.  These Care Teams include your primary Cardiologist (physician) and Advanced Practice Providers (APPs -  Physician Assistants and Nurse Practitioners) who all work together to provide you with the care you need, when you need it.  We recommend signing up for the  patient portal called "MyChart".  Sign up information is provided on this After Visit Summary.  MyChart is used to connect with patients for Virtual Visits (Telemedicine).  Patients are able to view lab/test results, encounter notes, upcoming appointments, etc.  Non-urgent messages can be sent to your provider as well.   To learn more about what you can do with MyChart, go to [email or web address].    Your next appointment:   12 months with Dr Caryl Comes  Other Instructions   Your cardiac CT will be scheduled at one of the below locations:   [facility] [address] [place], [place] [address] [phone]   If scheduled at [facility], please arrive at the [facility] and Children's Entrance (Entrance C2) of [facility] 30 minutes prior to test start time. You can use the FREE valet parking offered at entrance C (encouraged to control the heart rate for the test)  Proceed to the [facility] Radiology Department (first floor) to check-in and test prep.  All radiology patients and guests should use entrance C2 at [facility], accessed from [facility], even though the hospital's physical address listed is [address].      Please follow these instructions carefully (unless otherwise directed):  Hold all erectile dysfunction medications at least 3 days (72 hrs) prior to test. (Ie viagra, cialis, sildenafil, tadalafil, etc) We will administer nitroglycerin during this exam.   On the Night Before the Test: Be sure to Drink plenty of water. Do not consume any caffeinated/decaffeinated beverages or chocolate 12 hours prior to your test. Do not take  any antihistamines 12 hours prior to your test.  On the Day of the Test: Drink plenty of water until 1 hour prior to the test. Do not eat any food 1 hour prior to test. You may take your regular medications prior to the test.  Take metoprolol (Lopressor)100mg  - 1 tablet by mouth two  hours prior to test. If you take Furosemide/Hydrochlorothiazide/Spironolactone, please HOLD on the morning of the test. FEMALES- please wear underwire-free bra if available, avoid dresses & tight clothing       After the Test: Drink plenty of water. After receiving IV contrast, you may experience a mild flushed feeling. This is normal. On occasion, you may experience a mild rash up to 24 hours after the test. This is not dangerous. If this occurs, you can take Benadryl 25 mg and increase your fluid intake. If you experience trouble breathing, this can be serious. If it is severe call 911 IMMEDIATELY. If it is mild, please call our office. If you take any of these medications: Glipizide/Metformin, Avandament, Glucavance, please do not take 48 hours after completing test unless otherwise instructed.  We will call to schedule your test 2-4 weeks out understanding that some insurance companies will need an authorization prior to the service being performed.   For non-scheduling related questions, please contact the cardiac imaging nurse navigator should you have any questions/concerns: Marchia Bond, Cardiac Imaging Nurse Navigator Gordy Clement, Cardiac Imaging Nurse Navigator [facility] Direct Office Dial: [phone]   For scheduling needs, including cancellations and rescheduling, please call [place], [phone].

## 2023-02-04 ENCOUNTER — Encounter: Payer: Self-pay | Admitting: Pulmonary Disease

## 2023-02-04 ENCOUNTER — Ambulatory Visit: Payer: Medicare Other | Admitting: Pulmonary Disease

## 2023-02-04 VITALS — HR 82 | Ht 65.0 in | Wt 186.4 lb

## 2023-02-04 DIAGNOSIS — R053 Chronic cough: Secondary | ICD-10-CM

## 2023-02-04 MED ORDER — BREZTRI AEROSPHERE 160-9-4.8 MCG/ACT IN AERO: 2.0000 | INHALATION_SPRAY | Freq: Two times a day (BID) | RESPIRATORY_TRACT | 0 refills | Status: DC

## 2023-02-04 MED ORDER — PANTOPRAZOLE SODIUM 40 MG PO TBEC: 40.0000 mg | DELAYED_RELEASE_TABLET | Freq: Every day | ORAL | 3 refills | Status: DC

## 2023-02-04 NOTE — Patient Instructions (Signed)
Nice to meet you  I think the cough could be related to several things  My most suspicious for a postviral cough and activation of something like asthma given the persistence of your symptoms  While prednisone did not help, it is possible that there is still ongoing bronchospasm and different inhalers could help stop the bronchospasm and help with the cough  I provided samples of Breztri, use 2 puffs twice a day every day.  Rinse your  mouth out with water after every use.  It is okay to use lozenges for relief but please use sugar-free and mint free and menthol free, I often recommend using sugar-free [employer].  Try to rest your voice, minimize talking the next 2 or 3 days.  Try to suppress cough as you can.  With your reflux symptoms, this could also irritate the back of the throat and cause cough.  Recommend using pantoprazole 1 pill in the morning before breakfast, once daily.  New prescription today.  Return to clinic in 4 to 5 weeks or sooner as needed with Dr. Silas Flood

## 2023-02-08 ENCOUNTER — Telehealth (HOSPITAL_COMMUNITY): Payer: Self-pay | Admitting: Emergency Medicine

## 2023-02-08 NOTE — Telephone Encounter (Signed)
Reaching out to patient to offer assistance regarding upcoming cardiac imaging study; pt verbalizes understanding of appt date/time, parking situation and where to check in, pre-test NPO status and medications ordered, and verified current allergies; name and call back number provided for further questions should they arise Destiny Bond RN Navigator Cardiac Imaging [facility] [phone] office [phone] cell  Arrival 830 [address] entrance Hold hctz 183m metoprolol tartrate Denies iv issues Aware contrast/ nitro

## 2023-02-08 NOTE — Progress Notes (Signed)
$@Patientp$  ID: Destiny Brown, female    DOB: 11/05/1946, 77 y.o.   MRN: [id number]  Chief Complaint  Patient presents with   Consult    Pt is here for consult for cough. Pt states she has had the cough since October. Pt states it is productive with clear mucus. She states its thick. Pt states nothing OTC helps with the cough.     Referring provider: Reynold Bowen, MD  HPI:   77 y.o. woman whom we are seeing in evaluation of chronic cough. PCP note reviewed. Most recent cardiology note reviewed. ED note x 2 10/2022 reviewed.  She contracted what sounds like viral illness 09/2022. Most symptoms improved but cough has persisted. Seems worse in evenings. Seen via PCP office numerous times per her report. Reports at least 1 CXR, told was clear, maybe bronchitis? Can not view report nor images. Treated with various things, but not much has helped. Denies any significant improvement with medrol dosepack. Albuterol helps but time it helps is fleeting or short.   On further questioning, she uses lozenges often. They help a bit. Menthol in them. Speaking seems to irritate cough. Like when she talks a lot in a day her cough seems worse. Cough seem spasmodic. Deep breaths can trigger coughing spell. Cough largely dry. She does endorse GERD symptoms.    Questionaires / Pulmonary Flowsheets:   ACT:      No data to display          MMRC:     No data to display          Epworth:      No data to display          Tests:   FENO:  No results found for: "NITRICOXIDE"  PFT:     No data to display          WALK:      No data to display          Imaging: Personally reviewed and as per EMR No results found.  Lab Results: Personally reviewed CBC    Component Value Date/Time   WBC 5.6 03/18/2018 1003   WBC 11.8 (H) 03/20/2017 0412   RBC 4.23 03/18/2018 1003   RBC 3.87 03/20/2017 0412   HGB 12.2 03/18/2018 1003   HCT 37.9 03/18/2018 1003   PLT 244 03/18/2018  1003   MCV 90 03/18/2018 1003   MCH 28.8 03/18/2018 1003   MCH 29.5 03/20/2017 0412   MCHC 32.2 03/18/2018 1003   MCHC 32.9 03/20/2017 0412   RDW 14.3 03/18/2018 1003   LYMPHSABS 1.1 03/18/2018 1003   MONOABS 0.3 10/11/2014 1220   EOSABS 0.1 03/18/2018 1003   BASOSABS 0.0 03/18/2018 1003    BMET    Component Value Date/Time   NA 130 (L) 02/03/2023 0938   K 3.9 02/03/2023 0938   CL 92 (L) 02/03/2023 0938   CO2 19 (L) 02/03/2023 0938   GLUCOSE 90 02/03/2023 0938   GLUCOSE 173 (H) 03/20/2017 0412   BUN 26 02/03/2023 0938   CREATININE 1.27 (H) 02/03/2023 0938   CALCIUM 9.4 02/03/2023 0938   GFRNONAA 51 (L) 06/17/2020 0956   GFRAA 59 (L) 06/17/2020 0956    BNP No results found for: "BNP"  ProBNP    Component Value Date/Time   PROBNP 365.5 (H) 10/11/2014 1237    Specialty Problems       Pulmonary Problems   OSA (obstructive sleep apnea)    Moderate OSA with AHI  16.7 events per hour with successful CPAP titration to 8cm H2O       Allergies  Allergen Reactions   Codeine Other (See Comments)    Severe Headaches with codeine; but not with other opioids   Percocet [Oxycodone-Acetaminophen] Other (See Comments)    hallucinations    Immunization History  Administered Date(s) Administered   Influenza, High Dose Seasonal PF 10/01/2017, 08/27/2018, 08/08/2019   Influenza-Unspecified 10/19/2022   Zoster Recombinat (Shingrix) 08/27/2018, 10/13/2018    Past Medical History:  Diagnosis Date   Arthritis    Atrial fibrillation with RVR ([place]) 09/21/2014   Bronchitis    h/o   Diabetes mellitus    NO MEDS,  DIET CONTROLLED   History of migraines    "when working under alot of stress"   Hypertension    Hypothyroid    LBBB (left bundle branch block) 09/22/2014   OSA (obstructive sleep apnea) 04/09/2015   Moderate OSA with AHI 16.7 events per hour with successful CPAP titration to 8cm H2O   PONV (postoperative nausea and vomiting)    PT DOESNOT WANT TO TAKE HER  ATENOLOL     DOS.   " IT DROPS MY BP TO LOW"    Tobacco History: Social History   Tobacco Use  Smoking Status Never  Smokeless Tobacco Never   Counseling given: Not Answered   Continue to not smoke  Outpatient Encounter Medications as of 02/04/2023  Medication Sig   ACCU-CHEK FASTCLIX LANCETS MISC 1 each by Other route 2 (two) times daily. Use 1 lancet bid   ACCU-CHEK SMARTVIEW test strip 1 strip by Other route 2 (two) times daily. Use 1 strip to check glucose twice a day   albuterol (VENTOLIN HFA) 108 (90 Base) MCG/ACT inhaler Inhale 2 puffs into the lungs every 6 (six) hours as needed for wheezing or shortness of breath (Cough).   amLODipine (NORVASC) 5 MG tablet Take 5 mg by mouth daily.   apixaban (ELIQUIS) 5 MG TABS tablet Take 1 tablet (5 mg total) by mouth 2 (two) times daily.   Budeson-Glycopyrrol-Formoterol (BREZTRI AEROSPHERE) 160-9-4.8 MCG/ACT AERO Inhale 2 puffs into the lungs in the morning and at bedtime.   Cholecalciferol (VITAMIN D3) 1000 UNITS CAPS Take 1,000 Units by mouth daily.   hydrochlorothiazide (HYDRODIURIL) 25 MG tablet Take 25 mg by mouth daily.   levothyroxine (SYNTHROID, LEVOTHROID) 125 MCG tablet Take 125 mcg by mouth daily.   losartan (COZAAR) 100 MG tablet Take 100 mg by mouth daily.   metFORMIN (GLUCOPHAGE) 1000 MG tablet Take 1,000 mg by mouth 2 (two) times daily.   metoprolol tartrate (LOPRESSOR) 100 MG tablet Take one tablet 2 hours prior to cardiac CT   pantoprazole (PROTONIX) 40 MG tablet Take 1 tablet (40 mg total) by mouth daily.   rosuvastatin (CRESTOR) 5 MG tablet Take 5 mg by mouth 2 (two) times a week.   [DISCONTINUED] ciprofloxacin (CILOXAN) 0.3 % ophthalmic solution Administer 1 drop, every 2 hours, while awake, for 2 days. Then 1 drop, every 4 hours, while awake, for the next 5 days.   [DISCONTINUED] fexofenadine (ALLEGRA) 180 MG tablet Take 1 tablet (180 mg total) by mouth daily.   [DISCONTINUED] fluticasone (FLONASE) 50 MCG/ACT nasal  spray Place 1 spray into both nostrils daily. Begin by using 2 sprays in each nare daily for 3 to 5 days, then decrease to 1 spray in each nare daily.   [DISCONTINUED] methylPREDNISolone (MEDROL DOSEPAK) 4 MG TBPK tablet Take 24 mg on day 1, 20 mg  on day 2, 16 mg on day 3, 12 mg on day 4, 8 mg on day 5, 4 mg on day 6.  Take all tablets in each row at once, do not spread tablets out throughout the day.   No facility-administered encounter medications on file as of 02/04/2023.     Review of Systems  Review of Systems  No chest pain with exertion, no orthopnea or pnd. Comprehensive ROS otherwise negative.  Physical Exam  Pulse 82   Ht 5' 5"$  (1.651 m)   Wt 186 lb 6.4 oz (84.6 kg)   SpO2 99%   BMI 31.02 kg/m   Wt Readings from Last 5 Encounters:  02/04/23 186 lb 6.4 oz (84.6 kg)  02/03/23 184 lb 12.8 oz (83.8 kg)  02/26/22 179 lb 2 oz (81.3 kg)  02/17/22 179 lb 2 oz (81.3 kg)  09/24/21 187 lb 9.6 oz (85.1 kg)    BMI Readings from Last 5 Encounters:  02/04/23 31.02 kg/m  02/03/23 29.83 kg/m  02/26/22 28.91 kg/m  02/17/22 28.91 kg/m  09/24/21 31.22 kg/m     Physical Exam General: sitting in chair, in NAD Eyes: EOMI, no icterus Neck: supple, no JVP appreciated Pulmonary: clear, NWOB on RA CV: RRR, no murmur, no edema Abdomen: ND, BS present MSK: no synovitis, no joint effusion Neuro:norma; gait, no weakness Psych: normal mood, full affect  Assessment & Plan:   Chronic cough. Present 4-5 months. Reports clear CXR at PCP. Started with what sounds like viral illness. Suspicion for asthma. Not improved with oral steroids but sounds like bronchospasm based on description and observed cough in exam room. Trial Breztri 2 puff BID as suspect would benefit from bronchodilators. She is at risk for other contributors including upper airway cough syndrome with positive GERD symptoms. Advised voice rest for 2-3 days, use of sugar free, mint and menthol free lozenges. PPI daily  prescribed, as well.    Return in about 4 weeks (around 03/04/2023).   Lanier Clam, MD 02/08/2023   This appointment required 61 minutes of patient care (this includes precharting, chart review, review of results, face-to-face care, etc.).

## 2023-02-10 ENCOUNTER — Ambulatory Visit (HOSPITAL_COMMUNITY)
Admission: RE | Admit: 2023-02-10 | Discharge: 2023-02-10 | Disposition: A | Discharge status: Home / Self Care | Payer: Medicare Other | Source: Ambulatory Visit | Attending: Internal Medicine | Admitting: Internal Medicine

## 2023-02-10 ENCOUNTER — Other Ambulatory Visit: Payer: Self-pay | Admitting: Cardiovascular Disease

## 2023-02-10 ENCOUNTER — Ambulatory Visit (HOSPITAL_BASED_OUTPATIENT_CLINIC_OR_DEPARTMENT_OTHER)
Admission: RE | Admit: 2023-02-10 | Discharge: 2023-02-10 | Disposition: A | Discharge status: Home / Self Care | Payer: Medicare Other | Source: Ambulatory Visit | Attending: Cardiovascular Disease | Admitting: Cardiovascular Disease

## 2023-02-10 DIAGNOSIS — R931 Abnormal findings on diagnostic imaging of heart and coronary circulation: Secondary | ICD-10-CM | POA: Insufficient documentation

## 2023-02-10 DIAGNOSIS — I2510 Atherosclerotic heart disease of native coronary artery without angina pectoris: Secondary | ICD-10-CM

## 2023-02-10 DIAGNOSIS — R072 Precordial pain: Secondary | ICD-10-CM | POA: Insufficient documentation

## 2023-02-10 MED ORDER — NITROGLYCERIN 0.4 MG SL SUBL
0.8000 mg | SUBLINGUAL_TABLET | Freq: Once | SUBLINGUAL | Status: AC
Start: 2023-02-10 — End: 2023-02-10

## 2023-02-10 MED ORDER — NITROGLYCERIN 0.4 MG SL SUBL
SUBLINGUAL_TABLET | SUBLINGUAL | Status: AC
  Administered 2023-02-10: 0.8 mg via SUBLINGUAL
  Filled 2023-02-10: qty 2

## 2023-02-10 MED ORDER — IOHEXOL 350 MG/ML SOLN
100.0000 mL | Freq: Once | INTRAVENOUS | Status: AC | PRN
  Administered 2023-02-10: 100 mL via INTRAVENOUS

## 2023-02-15 ENCOUNTER — Telehealth: Payer: Self-pay | Admitting: Pulmonary Disease

## 2023-02-15 NOTE — Telephone Encounter (Signed)
Called and spoke with patient. Patient stated that she has had this cough since October. She stated that she's coughed yellow and clear phlegm up. Patient stated that nothing is helping her with the cough and the cough has been waking her up out of her sleep. Patient wants to know if something could be called in to her pharmacy for her cough.   BW, please advise.

## 2023-02-15 NOTE — Telephone Encounter (Signed)
Called and spoke with patient. Patient scheduled an OV to be seen. Patient verbalized understanding.   Nothing further needed.

## 2023-02-15 NOTE — Telephone Encounter (Signed)
Needs OV in person since this appears chronic. Can not assess via telemedicine. Would recommend take mucinex-dm 641m twice daily and try over the counter nasal spray such as fluticasone (Flonase) once daily

## 2023-02-19 ENCOUNTER — Encounter: Payer: Self-pay | Admitting: Primary Care

## 2023-02-19 ENCOUNTER — Ambulatory Visit: Payer: Medicare Other | Admitting: Primary Care

## 2023-02-19 VITALS — BP 118/62 | HR 92 | Temp 97.1°F | Ht 65.0 in | Wt 186.0 lb

## 2023-02-19 DIAGNOSIS — R053 Chronic cough: Secondary | ICD-10-CM

## 2023-02-19 MED ORDER — AZELASTINE-FLUTICASONE 137-50 MCG/ACT NA SUSP: 2.0000 | Freq: Two times a day (BID) | NASAL | 1 refills | Status: DC

## 2023-02-19 NOTE — Patient Instructions (Addendum)
Stop Breztri Aerosphere  Continue Albuterol 2 puffs every 4-6 hours as needed for shortness of breath Continue Portonix daily Continue Mucinex- DM '600mg'$  tablet once a day Start Dymista (Azelastine-Fluticasone) - use 1-2 sprays per nostril twice daily Start Zyretc (cetirizine) '5mg'$  daily (children dose) Stop other medication that you got over the counter   Follow-up: Keep apt with Dr. Silas Flood as scheduled

## 2023-02-19 NOTE — Assessment & Plan Note (Addendum)
-   Cough started after viral illness in October 2023. No improvement with Medrol steriod dose pack or Breztri Aerosphere. She has a lot of post nasal drip symptoms which are likely exacerbating her cough. Lungs were clear on exam. Recommend patient start Childrens Zyrtec 71m daily and Dymista nasal spray twice daily. Continue Mucinex-dm 6070monce daily to loosen chest congestion. Advised she can stop BrLibrarian, academicContinue Albuterol hfa 2 puffs every 4-6 hours for sob/chest tightness. FU as scheduled with Dr. HuSilas Flood

## 2023-02-19 NOTE — Progress Notes (Signed)
$'@Patient'U$  ID: Destiny Brown, female    DOB: [birth year], 77 y.o.   MRN: [id number]  Chief Complaint  Patient presents with   Follow-up    Chronic cough     Referring provider: Reynold Bowen, MD  HPI: 77 year old female, never smoked. PMH significant for afib, HTN, OSA, type 2 diabetes, obesity.  02/19/2023 Patient presents today chronic cough.  Patient was seen by Dr. Silas Flood on 02/04/2023 for consult due to chronic cough.  She contracted what sounds like a viral illness in October 2023.  Most symptoms improved but cough persisted.  No improvement with Medrol Dosepak. Albuterol helps at times for short period.   She has seen no improvement in her cough since last visit. She has been using SunGard two puffs twice daily as prescribed. She has coughing spells. She coughs less when laying down at night. Speaking seems to irritate her cough. She is taking pantoprazole once daily. Does not feel reflux is contributing to cough. She tried Mucinex-dm once a day and has been taking something over the counter that has been making her tired but she is unsure of the name. She has been using jolly ranchers.    Allergies  Allergen Reactions   Oxycodone Other (See Comments)   Codeine Other (See Comments)    Severe Headaches with codeine; but not with other opioids   Percocet [Oxycodone-Acetaminophen] Other (See Comments)    hallucinations    Immunization History  Administered Date(s) Administered   Influenza, High Dose Seasonal PF 10/01/2017, 08/27/2018, 08/08/2019   Influenza-Unspecified 10/19/2022   Zoster Recombinat (Shingrix) 08/27/2018, 10/13/2018    Past Medical History:  Diagnosis Date   Arthritis    Atrial fibrillation with RVR ([place]) 09/21/2014   Bronchitis    h/o   Diabetes mellitus    NO MEDS,  DIET CONTROLLED   History of migraines    "when working under alot of stress"   Hypertension    Hypothyroid    LBBB (left bundle branch block) 09/22/2014   OSA  (obstructive sleep apnea) 04/09/2015   Moderate OSA with AHI 16.7 events per hour with successful CPAP titration to 8cm H2O   PONV (postoperative nausea and vomiting)    PT DOESNOT WANT TO TAKE HER ATENOLOL     DOS.   " IT DROPS MY BP TO LOW"    Tobacco History: Social History   Tobacco Use  Smoking Status Never  Smokeless Tobacco Never   Counseling given: Not Answered   Outpatient Medications Prior to Visit  Medication Sig Dispense Refill   ACCU-CHEK FASTCLIX LANCETS MISC 1 each by Other route 2 (two) times daily. Use 1 lancet bid     ACCU-CHEK SMARTVIEW test strip 1 strip by Other route 2 (two) times daily. Use 1 strip to check glucose twice a day     albuterol (VENTOLIN HFA) 108 (90 Base) MCG/ACT inhaler Inhale 2 puffs into the lungs every 6 (six) hours as needed for wheezing or shortness of breath (Cough). 18 g 2   amLODipine (NORVASC) 5 MG tablet Take 5 mg by mouth daily.     apixaban (ELIQUIS) 5 MG TABS tablet Take 1 tablet (5 mg total) by mouth 2 (two) times daily. 180 tablet 1   Budeson-Glycopyrrol-Formoterol (BREZTRI AEROSPHERE) 160-9-4.8 MCG/ACT AERO Inhale 2 puffs into the lungs in the morning and at bedtime. 1 each 0   Cholecalciferol (VITAMIN D3) 1000 UNITS CAPS Take 1,000 Units by mouth daily.     hydrochlorothiazide (HYDRODIURIL) 25  MG tablet Take 25 mg by mouth daily.     levothyroxine (SYNTHROID, LEVOTHROID) 125 MCG tablet Take 125 mcg by mouth daily.     losartan (COZAAR) 100 MG tablet Take 100 mg by mouth daily.     metFORMIN (GLUCOPHAGE) 1000 MG tablet Take 1,000 mg by mouth 2 (two) times daily.     metoprolol tartrate (LOPRESSOR) 100 MG tablet Take one tablet 2 hours prior to cardiac CT 1 tablet 0   pantoprazole (PROTONIX) 40 MG tablet Take 1 tablet (40 mg total) by mouth daily. 30 tablet 3   rosuvastatin (CRESTOR) 5 MG tablet Take 5 mg by mouth 2 (two) times a week.     No facility-administered medications prior to visit.    Review of Systems  Review of  Systems  Constitutional: Negative.   HENT: Negative.    Respiratory:  Positive for cough. Negative for chest tightness, shortness of breath and wheezing.   Cardiovascular: Negative.     Physical Exam  BP 118/62 (BP Location: Left Arm, Patient Position: Sitting, Cuff Size: Normal)   Pulse 92   Temp (!) 97.1 F (36.2 C) (Temporal)   Ht '5\' 5"'$  (1.651 m)   Wt 186 lb (84.4 kg)   SpO2 99%   BMI 30.95 kg/m  Physical Exam Constitutional:      Appearance: Normal appearance.  HENT:     Head: Normocephalic and atraumatic.     Right Ear: Tympanic membrane normal. There is no impacted cerumen.     Left Ear: Tympanic membrane normal. There is no impacted cerumen.     Nose:     Comments: Audible nasal congestion     Mouth/Throat:     Mouth: Mucous membranes are moist.     Pharynx: Oropharynx is clear. No oropharyngeal exudate or posterior oropharyngeal erythema.  Cardiovascular:     Rate and Rhythm: Normal rate and regular rhythm.  Pulmonary:     Effort: Pulmonary effort is normal.     Breath sounds: Normal breath sounds. No wheezing, rhonchi or rales.     Comments: Congested cough; Lungs clear to auscultration.  Musculoskeletal:     Cervical back: Normal range of motion and neck supple.  Skin:    General: Skin is warm and dry.  Neurological:     General: No focal deficit present.     Mental Status: She is alert and oriented to person, place, and time. Mental status is at baseline.  Psychiatric:        Mood and Affect: Mood normal.        Behavior: Behavior normal.        Thought Content: Thought content normal.        Judgment: Judgment normal.      Lab Results:  CBC    Component Value Date/Time   WBC 5.6 03/18/2018 1003   WBC 11.8 (H) 03/20/2017 0412   RBC 4.23 03/18/2018 1003   RBC 3.87 03/20/2017 0412   HGB 12.2 03/18/2018 1003   HCT 37.9 03/18/2018 1003   PLT 244 03/18/2018 1003   MCV 90 03/18/2018 1003   MCH 28.8 03/18/2018 1003   MCH 29.5 03/20/2017 0412    MCHC 32.2 03/18/2018 1003   MCHC 32.9 03/20/2017 0412   RDW 14.3 03/18/2018 1003   LYMPHSABS 1.1 03/18/2018 1003   MONOABS 0.3 10/11/2014 1220   EOSABS 0.1 03/18/2018 1003   BASOSABS 0.0 03/18/2018 1003    BMET    Component Value Date/Time   NA 130 (L) 02/03/2023  [id number]   K 3.9 02/03/2023 0938   CL 92 (L) 02/03/2023 0938   CO2 19 (L) 02/03/2023 0938   GLUCOSE 90 02/03/2023 0938   GLUCOSE 173 (H) 03/20/2017 0412   BUN 26 02/03/2023 0938   CREATININE 1.27 (H) 02/03/2023 0938   CALCIUM 9.4 02/03/2023 0938   GFRNONAA 51 (L) 06/17/2020 0956   GFRAA 59 (L) 06/17/2020 0956    BNP No results found for: "BNP"  ProBNP    Component Value Date/Time   PROBNP 365.5 (H) 10/11/2014 1237    Imaging: CT CORONARY MORPH W/CTA COR W/SCORE W/CA W/CM &/OR WO/CM  Addendum Date: 02/13/2023   ADDENDUM REPORT: 02/13/2023 10:35 ADDENDUM: OVER-READ INTERPRETATION  CT CHEST The following report is an over-read performed by radiologist Dr. Dillard Cannon III MD of [facility] on Creation date. This over-read does not include interpretation of cardiac or coronary anatomy or pathology. The CTA interpretation by the cardiologist is attached. Blood pool is hypodense compared to the interventricular septum suggesting anemia. No mediastinal mass or adenopathy. No pleural effusion. No pneumothorax. Right shoulder arthroplasty. Paired dorsal stimulator catheters. Lumbar fusion hardware. Cervical fixation hardware. Cholecystectomy clips. IMPRESSION: No acute ancillary findings Electronically Signed   By: Lucrezia Europe M.D.   On: 02/13/2023 10:35   Result Date: 02/13/2023 CLINICAL DATA:  Chest pain EXAM: Cardiac CTA MEDICATIONS: Sub lingual nitro. '4mg'$  and lopressor '100mg'$  TECHNIQUE: The patient was scanned on a Siemens Force [id number] slice scanner. Gantry rotation speed was 250 msecs. Collimation was .6 mm. A 100 kV prospective scan was triggered in the ascending thoracic aorta at 140 HU's Full mA was used  between 35% and 75% of the R-R interval. Average HR during the scan was 62 bpm. The 3D data set was interpreted on a dedicated work station using MPR, MIP and VRT modes. A total of 80cc of contrast was used. FINDINGS: Non-cardiac: See separate report from [facility]. No significant findings on limited lung and soft tissue windows. Calcium Score: 3 vessel calcium noted LM 27 LAD 210 LCX 145 RCA 74.8 Total 457 which is 16 st percentile for age/sex Coronary Arteries: Right dominant with no anomalies LM: 1-24% ostial stenosis LAD: > 70% long area of calcified plaque in proximal vessel extending near ostium of D1 D1: Normal D2: Normal Circumflex: 50-69% % calcified plaque in proximal and mid vessel OM1: 50-69% % mixed plaque in proximal vessel OM2: Normal RCA: 1-24% mixed plaque in ostium PDA: Normal PLA: Normal IMPRESSION: 1. 3 vessel calcium noted with score 457 which is 81 st percentile for age/sex 2.  Normal ascending thoracic aorta 3.4 cm 3. CAD RADS 4 obstructive disease in proximal LAD Study sent for [facility] [place] Electronically Signed: By: Jenkins Rouge M.D. On: 02/10/2023 10:38   CT CORONARY FRACTIONAL FLOW RESERVE FLUID ANALYSIS  Result Date: 02/10/2023 CLINICAL DATA:  CAD EXAM: FFR CT TECHNIQUE: The best systolic and diastolic phases of the patients gated cardiac CTA sent to [facility] for hemodynamic analysis FINDINGS: FFR CT positive in mid/distal LAD 0.80 and 0.75 FFR CT positive in mid LXC/OM1 0.76 IMPRESSION: FFR suggests hemodynamically significant stenosis in LAD and LCX [place] Electronically Signed   By: Jenkins Rouge M.D.   On: 02/10/2023 10:40     Assessment & Plan:   Chronic cough - Cough started after viral illness in October 2023. No improvement with Medrol steriod dose pack or Breztri Aerosphere. She has a lot of post nasal drip symptoms which are likely exacerbating her  cough. Lungs were clear on exam. Recommend patient start Childrens Zyrtec '5mg'$  daily and Dymista  nasal spray twice daily. Continue Mucinex-dm '600mg'$  once daily to loosen chest congestion. 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 Continue Albuterol hfa 2 puffs every 4-6 hours for sob/chest tightness. FU as scheduled with Dr. Silas Flood.    Martyn Ehrich, NP 02/19/2023

## 2023-02-24 ENCOUNTER — Ambulatory Visit (HOSPITAL_COMMUNITY): Payer: Medicare Other | Attending: Internal Medicine

## 2023-02-24 DIAGNOSIS — R0609 Other forms of dyspnea: Secondary | ICD-10-CM

## 2023-02-24 DIAGNOSIS — R072 Precordial pain: Secondary | ICD-10-CM

## 2023-02-24 LAB — ECHOCARDIOGRAM COMPLETE
AR max vel: 1.6 cm2
AV Area VTI: 1.76 cm2
AV Area mean vel: 1.64 cm2
AV Mean grad: 7 mmHg
AV Peak grad: 12.7 mmHg
Ao pk vel: 1.78 m/s
Area-P 1/2: 3.34 cm2
S' Lateral: 3.53 cm

## 2023-03-04 ENCOUNTER — Encounter: Payer: Self-pay | Admitting: Internal Medicine

## 2023-03-04 ENCOUNTER — Telehealth: Payer: Self-pay

## 2023-03-04 ENCOUNTER — Ambulatory Visit: Payer: Medicare Other | Attending: Internal Medicine | Admitting: Internal Medicine

## 2023-03-04 ENCOUNTER — Encounter: Payer: Self-pay | Admitting: Pulmonary Disease

## 2023-03-04 ENCOUNTER — Ambulatory Visit: Payer: Medicare Other | Admitting: Pulmonary Disease

## 2023-03-04 VITALS — BP 122/68 | HR 77 | Ht 65.0 in | Wt 179.6 lb

## 2023-03-04 DIAGNOSIS — R072 Precordial pain: Secondary | ICD-10-CM

## 2023-03-04 DIAGNOSIS — R053 Chronic cough: Secondary | ICD-10-CM | POA: Diagnosis not present

## 2023-03-04 DIAGNOSIS — I480 Paroxysmal atrial fibrillation: Secondary | ICD-10-CM | POA: Diagnosis not present

## 2023-03-04 MED ORDER — AZELASTINE HCL 0.1 % NA SOLN
2.0000 | Freq: Two times a day (BID) | NASAL | 2 refills | Status: DC
Start: 2023-03-04 — End: 2023-04-20

## 2023-03-04 MED ORDER — FLUTICASONE PROPIONATE 50 MCG/ACT NA SUSP: 1.0000 | Freq: Every day | NASAL | 2 refills | Status: DC

## 2023-03-04 NOTE — Progress Notes (Addendum)
Electrophysiology TeleHealth Note      Date:  03/04/2023   ID:  Destiny Brown, Destiny Brown Mar 17, 1946, MRN [id number]  Location: patient's home  Provider location: [address] [place]  Evaluation Performed: Follow-up visit  PCP:  Reynold Bowen, MD  Cardiologist:   SK Electrophysiologist:  SK   Chief Complaint:  chest pain and shortness of breath   History of Present Illness:    Destiny Brown is a 77 y.o. female who presents via audio/video conferencing for a telehealth visit today.  Since last being seen in our clinic for worsening dyspnea and chest discomfort in the context of hypertensive heart disease chronic left bundle branch block with prior atrial fibrillation and chronic cough following the flu the patient reports struggling with dyspnea and seeoing pulm  Her CTA undertaken for above complaints came back with +FFR on LCx and LAD     The patient denies symptoms of fevers, chills, cough, or new SOB worrisome for COVID 19.    Past Medical History:  Diagnosis Date   Arthritis    Atrial fibrillation with RVR ([place]) 09/21/2014   Bronchitis    h/o   Diabetes mellitus    NO MEDS,  DIET CONTROLLED   History of migraines    "when working under alot of stress"   Hypertension    Hypothyroid    LBBB (left bundle branch block) 09/22/2014   OSA (obstructive sleep apnea) 04/09/2015   Moderate OSA with AHI 16.7 events per hour with successful CPAP titration to 8cm H2O   PONV (postoperative nausea and vomiting)    PT DOESNOT WANT TO TAKE HER ATENOLOL     DOS.   " IT DROPS MY BP TO LOW"    Past Surgical History:  Procedure Laterality Date   ABDOMINAL HYSTERECTOMY  03/2001   APPENDECTOMY     BACK SURGERY     CATARACT EXTRACTION, BILATERAL     WITH IMPLANTS   CERVICAL FUSION  08/2003   CHOLECYSTECTOMY  03/2010   EYE SURGERY     LUMBAR DISC SURGERY     LUMBAR LAMINECTOMY/DECOMPRESSION MICRODISCECTOMY  10/2010   POSTERIOR FUSION LUMBAR SPINE  12/16/11;  12/2005; 07/2004   L2-3; L3-4; L4-5   REVERSE SHOULDER ARTHROPLASTY Right 02/26/2022   Procedure: REVERSE SHOULDER ARTHROPLASTY;  Surgeon: Tania Ade, MD;  Location: [facility];  Service: Orthopedics;  Laterality: Right;   TOTAL KNEE ARTHROPLASTY Right 03/18/2017   Procedure: RIGHT TOTAL KNEE ARTHROPLASTY;  Surgeon: Sydnee Cabal, MD;  Location: [facility];  Service: Orthopedics;  Laterality: Right;    Current Outpatient Medications  Medication Sig Dispense Refill   ACCU-CHEK FASTCLIX LANCETS MISC 1 each by Other route 2 (two) times daily. Use 1 lancet bid     ACCU-CHEK SMARTVIEW test strip 1 strip by Other route 2 (two) times daily. Use 1 strip to check glucose twice a day     albuterol (VENTOLIN HFA) 108 (90 Base) MCG/ACT inhaler Inhale 2 puffs into the lungs every 6 (six) hours as needed for wheezing or shortness of breath (Cough). 18 g 2   amLODipine (NORVASC) 5 MG tablet Take 5 mg by mouth daily.     apixaban (ELIQUIS) 5 MG TABS tablet Take 1 tablet (5 mg total) by mouth 2 (two) times daily. 180 tablet 1   azelastine (ASTELIN) 0.1 % nasal spray Place 2 sprays into both nostrils 2 (two) times daily. Use in each nostril as directed 30 mL 2   Cholecalciferol (VITAMIN  D3) 1000 UNITS CAPS Take 1,000 Units by mouth daily.     fluticasone (FLONASE) 50 MCG/ACT nasal spray Place 1 spray into both nostrils daily. 16 g 2   hydrochlorothiazide (HYDRODIURIL) 25 MG tablet Take 25 mg by mouth daily.     levothyroxine (SYNTHROID, LEVOTHROID) 125 MCG tablet Take 125 mcg by mouth daily.     losartan (COZAAR) 100 MG tablet Take 100 mg by mouth daily.     metFORMIN (GLUCOPHAGE) 1000 MG tablet Take 1,000 mg by mouth 2 (two) times daily.     metoprolol tartrate (LOPRESSOR) 100 MG tablet Take one tablet 2 hours prior to cardiac CT 1 tablet 0   pantoprazole (PROTONIX) 40 MG tablet Take 1 tablet (40 mg total) by mouth daily. 30 tablet 3   rosuvastatin (CRESTOR) 5 MG tablet Take 5 mg by mouth 2 (two) times a week.      No current facility-administered medications for this visit.    Allergies:   Oxycodone, Codeine, and Percocet [oxycodone-acetaminophen]   Social History:  The patient  reports that she has never smoked. She has never used smokeless tobacco. She reports that she does not drink alcohol and does not use drugs.   Family History:  The patient's   She was adopted. Family history is unknown by patient.   ROS:  Please see the history of present illness.   All other systems are personally reviewed and negative.    Exam:    Vital Signs:  BP 122/68   Pulse 77   Ht 5\' 5"  (1.651 m)   Wt 179 lb 9.6 oz (81.5 kg)   SpO2 97%   BMI 29.89 kg/m         Labs/Other Tests and Data Reviewed:    Recent Labs: 02/03/2023: BUN 26; Creatinine, Ser 1.27; Potassium 3.9; Sodium 130   Wt Readings from Last 3 Encounters:  03/04/23 179 lb 9.6 oz (81.5 kg)  03/04/23 179 lb 9.6 oz (81.5 kg)  02/19/23 186 lb (84.4 kg)     Other studies personally reviewed: Additional studies/ records that were reviewed today include: (As above)   Review of the above records today demonstrates: incl pulm notes Prior radiographs:     ASSESSMENT & PLAN:    Paroxysmal atrial fibrillation   Left bundle branch block   Hypertension   Palpitations--with and without ectopy    Exertional dyspnea and chest discomfort   Cough-persistent post influenza   Presyncope  Chest pain and dyspnea>> + CTA with + FFR of Cx and LAD  Have recommended the initiation of ASA, continue statin  Also discussed cardiac catheterization. Reveiewed risks and benefits, no contrast allergy    Follow-up:  68m     Current medicines are reviewed at length with the patient today.   The patient does not have concerns regarding her medicines.  The following changes were made today:  add ASA  Labs/ tests ordered today include:   Orders Placed This Encounter  Procedures   Basic metabolic panel   CBC      Today, I have spent 22  minutes  with the patient with telehealth technology discussing the above.  Signed, Virl Axe, MD  03/04/2023 8:38 PM     [place] [address] [place] [address] [phone] (office) [phone] (fax)

## 2023-03-04 NOTE — Telephone Encounter (Signed)
Spoke with the patient and scheduled her for cath.

## 2023-03-04 NOTE — Patient Instructions (Signed)
Nice to see you again  Continue the heartburn medicine  I sent prescription for azelastine nasal spray, 2 sprays each nostril twice a day every day.   Progression for Flonase nasal spray, 1 spray each nostril twice a day for 1 week then decrease to 1 spray each nostril once daily  You can get the cetirizine tablet, this is a antihistamine.  Over-the-counter.  Take it 1 tablet at night.  Return to clinic in 3 months or sooner as needed, please send a message in the coming weeks if things or not improving.  We can troubleshoot and try new things in the interim if not getting better.

## 2023-03-04 NOTE — Patient Instructions (Addendum)
Medication Instructions:  Your physician recommends that you continue on your current medications as directed. Please refer to the Current Medication list given to you today.  *If you need a refill on your cardiac medications before your next appointment, please call your pharmacy*  Lab Work: BMET and CBC on 3/11 anytime between 7:30am and 5:00pm  Testing/Procedures: Your physician has requested that you have a cardiac catheterization. Cardiac catheterization is used to diagnose and/or treat various heart conditions. Doctors may recommend this procedure for a number of different reasons. The most common reason is to evaluate chest pain. Chest pain can be a symptom of coronary artery disease (CAD), and cardiac catheterization can show whether plaque is narrowing or blocking your heart's arteries. This procedure is also used to evaluate the valves, as well as measure the blood flow and oxygen levels in different parts of your heart. For further information please visit [email or web address][employer][email or web address]. Please follow instruction sheet, as given.  Follow-Up: At [facility], you and your health needs are our priority.  As part of our continuing mission to provide you with exceptional heart care, we have created designated Provider Care Teams.  These Care Teams include your primary Cardiologist (physician) and Advanced Practice Providers (APPs -  Physician Assistants and Nurse Practitioners) who all work together to provide you with the care you need, when you need it.  Other Instructions   [place] [facility] OF [facility] [place], [place] [id number] [place] [address] Dept: [phone] Loc: [phone]  Destiny Brown  03/04/2023  You are scheduled for a Cardiac Catheterization on Monday, March 18 with Dr. Glenetta Hew.  1. Please arrive at the [facility] (Main Entrance A) at [facility]: [address] [place], [place] [address] at 5:30 AM (This time is two hours before your procedure to ensure your preparation). Free valet parking service is available.   Special note: Every effort is made to have your procedure done on time. Please understand that emergencies sometimes delay scheduled procedures.  2. Diet: Do not eat solid foods after midnight.  The patient may have clear liquids until 5am upon the day of the procedure.  3. Labs: You will need to have blood drawn on Monday, March 11 at [facility]. [address]  Open: 7:30am - 5pm    Phone: [phone]. You do not need to be fasting.  4. Medication instructions in preparation for your procedure:   Contrast Allergy: No  Stop taking Eliquis (Apixiban) on Friday, March 15.  Stop taking, HTCZ (Hydrochlorothiazide) Sunday, March 17,  Do not take Diabetes Med Glucophage (Metformin) on the day of the procedure and HOLD 48 HOURS AFTER THE PROCEDURE.  On the morning of your procedure, take your Aspirin 81 mg and any morning medicines NOT listed above.  You may use sips of water.  5. Plan for one night stay--bring personal belongings. 6. Bring a current list of your medications and current insurance cards. 7. You MUST have a responsible person to drive you home. 8. Someone MUST be with you the first 24 hours after you arrive home or your discharge will be delayed. 9. Please wear clothes that are easy to get on and off and wear slip-on shoes.  Thank you for allowing [place] to care for you!   -- [facility]

## 2023-03-04 NOTE — Telephone Encounter (Signed)
..   Pt understands that although there may be some limitations with this type of visit, we will take all precautions to reduce any security or privacy concerns.  Pt understands that this will be treated like an in office visit and we will file with pt's insurance, and there may be a patient responsible charge related to this service. ? ?

## 2023-03-04 NOTE — Progress Notes (Signed)
$'@Patient'M$  ID: Destiny Brown, female    DOB: 09-26-1946, 77 y.o.   MRN: [id number]  Chief Complaint  Patient presents with   Follow-up    Pt is here for follow up for cough. Pt states the cough is still there. She did not get the spray due to the cost. She states she was taken off of breztri but the cough has not changes. Echocardiogram done 02/24/2023    Referring provider: Reynold Bowen, MD  HPI:   77 y.o. woman whom we are seeing in evaluation of chronic cough.  Most recent note from Derl Barrow, NP in pulmonary clinic reviewed.  At last visit, concern for postviral syndrome, possible asthma with ongoing cough triggered by what sounds like viral illness.  Placed on Breztri.  Also placed on pantoprazole given reflux symptoms.  No improvement.  Denies improved with steroids in the past once again.  At most recent pulmonary visit she was placed on nasal regimen, azelastine fluticasone and started on Zyrtec.  Breztri was stopped.  Cough no better or worse since stopping.  She has not taken the Zyrtec nor the nasal sprays prescribed last visit.  Nasal spray combination was too expensive.  Discussed trying an individual parks to see if cheaper.  Also instructed to start Zyrtec.  She does endorse significant nasal congestion, forehead pressure, runny nose.  HPI at initial visit: She contracted what sounds like viral illness 09/2022. Most symptoms improved but cough has persisted. Seems worse in evenings. Seen via PCP office numerous times per her report. Reports at least 1 CXR, told was clear, maybe bronchitis? Can not view report nor images. Treated with various things, but not much has helped. Denies any significant improvement with medrol dosepack. Albuterol helps but time it helps is fleeting or short.   On further questioning, she uses lozenges often. They help a bit. Menthol in them. Speaking seems to irritate cough. Like when she talks a lot in a day her cough seems worse. Cough seem  spasmodic. Deep breaths can trigger coughing spell. Cough largely dry. She does endorse GERD symptoms.    Questionaires / Pulmonary Flowsheets:   ACT:      No data to display          MMRC:     No data to display          Epworth:      No data to display          Tests:   FENO:  No results found for: "NITRICOXIDE"  PFT:     No data to display          WALK:      No data to display          Imaging: Personally reviewed and as per EMR ECHOCARDIOGRAM COMPLETE  Result Date: 02/24/2023    ECHOCARDIOGRAM REPORT   Patient Name:   Destiny Brown Date of Exam: 02/24/2023 Medical Rec #:  [id number]        Height:       65.0 in Accession #:    [id number]       Weight:       186.0 lb Date of Birth:  Sep 20, 1946        BSA:          1.918 m Patient Age:    54 years         BP:           118/62 mmHg Patient Gender: F  HR:           97 bpm. Exam Location:  [address] Procedure: 2D Echo, 3D Echo, Cardiac Doppler and Color Doppler Indications:     R06.00 Dyspnea  History:         Patient has prior history of Echocardiogram examinations, most                  recent 09/22/2014. Arrythmias:LBBB and Atrial Fibrillation,                  Signs/Symptoms:Dyspnea and Chest Pain; Risk Factors:Sleep                  Apnea, Hypertension and Diabetes. Obesity, Chronic Cough.  Sonographer:     Deliah Boston RDCS Referring Phys:  Deboraha Sprang Diagnosing Phys: Glori Bickers MD IMPRESSIONS  1. Left ventricular ejection fraction, by estimation, is 55 to 60%. The left ventricle has normal function. The left ventricle demonstrates regional wall motion abnormalities (see scoring diagram/findings for description). There is mild concentric left ventricular hypertrophy. Left ventricular diastolic parameters are consistent with Grade I diastolic dysfunction (impaired relaxation). There is hypokinesis of the left ventricular, basal-mid inferior wall.  2. Right ventricular  systolic function is normal. The right ventricular size is normal.  3. Left atrial size was mildly dilated.  4. The mitral valve is normal in structure. Trivial mitral valve regurgitation. No evidence of mitral stenosis.  5. The aortic valve is tricuspid. There is mild calcification of the aortic valve. Aortic valve regurgitation is not visualized. Aortic valve sclerosis/calcification is present, without any evidence of aortic stenosis. Aortic valve area, by VTI measures  1.76 cm. Aortic valve mean gradient measures 7.0 mmHg. Aortic valve Vmax measures 1.78 m/s.  6. The inferior vena cava is normal in size with greater than 50% respiratory variability, suggesting right atrial pressure of 3 mmHg. FINDINGS  Left Ventricle: Left ventricular ejection fraction, by estimation, is 55 to 60%. The left ventricle has normal function. The left ventricle demonstrates regional wall motion abnormalities. The left ventricular internal cavity size was normal in size. There is mild concentric left ventricular hypertrophy. Left ventricular diastolic parameters are consistent with Grade I diastolic dysfunction (impaired relaxation). Right Ventricle: The right ventricular size is normal. No increase in right ventricular wall thickness. Right ventricular systolic function is normal. Left Atrium: Left atrial size was mildly dilated. Right Atrium: Right atrial size was normal in size. Pericardium: There is no evidence of pericardial effusion. Mitral Valve: The mitral valve is normal in structure. Trivial mitral valve regurgitation. No evidence of mitral valve stenosis. Tricuspid Valve: The tricuspid valve is normal in structure. Tricuspid valve regurgitation is trivial. No evidence of tricuspid stenosis. Aortic Valve: The aortic valve is tricuspid. There is mild calcification of the aortic valve. Aortic valve regurgitation is not visualized. Aortic valve sclerosis/calcification is present, without any evidence of aortic stenosis. Aortic  valve mean gradient measures 7.0 mmHg. Aortic valve peak gradient measures 12.7 mmHg. Aortic valve area, by VTI measures 1.76 cm. Pulmonic Valve: The pulmonic valve was normal in structure. Pulmonic valve regurgitation is mild. No evidence of pulmonic stenosis. Aorta: The aortic root is normal in size and structure. Venous: The inferior vena cava is normal in size with greater than 50% respiratory variability, suggesting right atrial pressure of 3 mmHg. IAS/Shunts: No atrial level shunt detected by color flow Doppler.  LEFT VENTRICLE PLAX 2D LVIDd:         4.40 cm   Diastology  LVIDs:         3.53 cm   LV e' medial:    5.33 cm/s LV PW:         0.93 cm   LV E/e' medial:  10.9 LV IVS:        1.07 cm   LV e' lateral:   6.25 cm/s LVOT diam:     2.00 cm   LV E/e' lateral: 9.3 LV SV:         60 LV SV Index:   31 LVOT Area:     3.14 cm                           3D Volume EF:                          3D EF:        67 %                          LV EDV:       112 ml                          LV ESV:       37 ml                          LV SV:        75 ml RIGHT VENTRICLE RV Basal diam:  3.10 cm RV S prime:     14.60 cm/s TAPSE (M-mode): 2.1 cm RVSP:           37.6 mmHg LEFT ATRIUM             Index        RIGHT ATRIUM           Index LA diam:        4.20 cm 2.19 cm/m   RA Pressure: 3.00 mmHg LA Vol (A2C):   42.8 ml 22.31 ml/m  RA Area:     12.20 cm LA Vol (A4C):   48.4 ml 25.23 ml/m  RA Volume:   28.60 ml  14.91 ml/m LA Biplane Vol: 46.3 ml 24.14 ml/m  AORTIC VALVE AV Area (Vmax):    1.60 cm AV Area (Vmean):   1.64 cm AV Area (VTI):     1.76 cm AV Vmax:           178.00 cm/s AV Vmean:          120.500 cm/s AV VTI:            0.339 m AV Peak Grad:      12.7 mmHg AV Mean Grad:      7.0 mmHg LVOT Vmax:         90.40 cm/s LVOT Vmean:        62.900 cm/s LVOT VTI:          0.190 m LVOT/AV VTI ratio: 0.56  AORTA Ao Root diam: 2.70 cm Ao Asc diam:  3.30 cm MITRAL VALVE                TRICUSPID VALVE MV Area (PHT)  cm           TR Peak grad:   34.6 mmHg MV Decel Time: 227 msec     TR Vmax:  294.00 cm/s MV E velocity: 57.90 cm/s   Estimated RAP:  3.00 mmHg MV A velocity: 104.00 cm/s  RVSP:           37.6 mmHg MV E/A ratio:  0.56                             SHUNTS                             Systemic VTI:  0.19 m                             Systemic Diam: 2.00 cm Glori Bickers MD Electronically signed by Glori Bickers MD Signature Date/Time: 02/24/2023/4:24:15 PM    Final (Updated)    CT CORONARY MORPH W/CTA COR W/SCORE Lewanda Rife W/CM &/OR WO/CM  Addendum Date: 02/13/2023   ADDENDUM REPORT: 02/13/2023 10:35 ADDENDUM: OVER-READ INTERPRETATION  CT CHEST The following report is an over-read performed by radiologist Dr. Dillard Cannon III MD of [facility] on Creation date. This over-read does not include interpretation of cardiac or coronary anatomy or pathology. The CTA interpretation by the cardiologist is attached. Blood pool is hypodense compared to the interventricular septum suggesting anemia. No mediastinal mass or adenopathy. No pleural effusion. No pneumothorax. Right shoulder arthroplasty. Paired dorsal stimulator catheters. Lumbar fusion hardware. Cervical fixation hardware. Cholecystectomy clips. IMPRESSION: No acute ancillary findings Electronically Signed   By: Lucrezia Europe M.D.   On: 02/13/2023 10:35   Result Date: 02/13/2023 CLINICAL DATA:  Chest pain EXAM: Cardiac CTA MEDICATIONS: Sub lingual nitro. '4mg'$  and lopressor '100mg'$  TECHNIQUE: The patient was scanned on a Siemens Force [id number] slice scanner. Gantry rotation speed was 250 msecs. Collimation was .6 mm. A 100 kV prospective scan was triggered in the ascending thoracic aorta at 140 HU's Full mA was used between 35% and 75% of the R-R interval. Average HR during the scan was 62 bpm. The 3D data set was interpreted on a dedicated work station using MPR, MIP and VRT modes. A total of 80cc of contrast was used. FINDINGS: Non-cardiac: See separate report  from [facility]. No significant findings on limited lung and soft tissue windows. Calcium Score: 3 vessel calcium noted LM 27 LAD 210 LCX 145 RCA 74.8 Total 457 which is 39 st percentile for age/sex Coronary Arteries: Right dominant with no anomalies LM: 1-24% ostial stenosis LAD: > 70% long area of calcified plaque in proximal vessel extending near ostium of D1 D1: Normal D2: Normal Circumflex: 50-69% % calcified plaque in proximal and mid vessel OM1: 50-69% % mixed plaque in proximal vessel OM2: Normal RCA: 1-24% mixed plaque in ostium PDA: Normal PLA: Normal IMPRESSION: 1. 3 vessel calcium noted with score 457 which is 81 st percentile for age/sex 2.  Normal ascending thoracic aorta 3.4 cm 3. CAD RADS 4 obstructive disease in proximal LAD Study sent for [facility] [place] Electronically Signed: By: [place] M.D. On: 02/10/2023 10:38   CT CORONARY FRACTIONAL FLOW RESERVE FLUID ANALYSIS  Result Date: 02/10/2023 CLINICAL DATA:  CAD EXAM: FFR CT TECHNIQUE: The best systolic and diastolic phases of the patients gated cardiac CTA sent to [facility] for hemodynamic analysis FINDINGS: FFR CT positive in mid/distal LAD 0.80 and 0.75 FFR CT positive in mid LXC/OM1 0.76 IMPRESSION: FFR suggests hemodynamically significant stenosis in LAD and LCX [place]  Electronically Signed   By: Jenkins Rouge M.D.   On: 02/10/2023 10:40    Lab Results: Personally reviewed CBC    Component Value Date/Time   WBC 5.6 03/18/2018 1003   WBC 11.8 (H) 03/20/2017 0412   RBC 4.23 03/18/2018 1003   RBC 3.87 03/20/2017 0412   HGB 12.2 03/18/2018 1003   HCT 37.9 03/18/2018 1003   PLT 244 03/18/2018 1003   MCV 90 03/18/2018 1003   MCH 28.8 03/18/2018 1003   MCH 29.5 03/20/2017 0412   MCHC 32.2 03/18/2018 1003   MCHC 32.9 03/20/2017 0412   RDW 14.3 03/18/2018 1003   LYMPHSABS 1.1 03/18/2018 1003   MONOABS 0.3 10/11/2014 1220   EOSABS 0.1 03/18/2018 1003   BASOSABS 0.0 03/18/2018 1003    BMET    Component  Value Date/Time   NA 130 (L) 02/03/2023 0938   K 3.9 02/03/2023 0938   CL 92 (L) 02/03/2023 0938   CO2 19 (L) 02/03/2023 0938   GLUCOSE 90 02/03/2023 0938   GLUCOSE 173 (H) 03/20/2017 0412   BUN 26 02/03/2023 0938   CREATININE 1.27 (H) 02/03/2023 0938   CALCIUM 9.4 02/03/2023 0938   GFRNONAA 51 (L) 06/17/2020 0956   GFRAA 59 (L) 06/17/2020 0956    BNP No results found for: "BNP"  ProBNP    Component Value Date/Time   PROBNP 365.5 (H) 10/11/2014 1237    Specialty Problems       Pulmonary Problems   OSA (obstructive sleep apnea)    Moderate OSA with AHI 16.7 events per hour with successful CPAP titration to 8cm H2O      Chronic cough    Allergies  Allergen Reactions   Oxycodone Other (See Comments)   Codeine Other (See Comments)    Severe Headaches with codeine; but not with other opioids   Percocet [Oxycodone-Acetaminophen] Other (See Comments)    hallucinations    Immunization History  Administered Date(s) Administered   Influenza, High Dose Seasonal PF 10/01/2017, 08/27/2018, 08/08/2019   Influenza-Unspecified 10/19/2022   Zoster Recombinat (Shingrix) 08/27/2018, 10/13/2018    Past Medical History:  Diagnosis Date   Arthritis    Atrial fibrillation with RVR ([place]) 09/21/2014   Bronchitis    h/o   Diabetes mellitus    NO MEDS,  DIET CONTROLLED   History of migraines    "when working under alot of stress"   Hypertension    Hypothyroid    LBBB (left bundle branch block) 09/22/2014   OSA (obstructive sleep apnea) 04/09/2015   Moderate OSA with AHI 16.7 events per hour with successful CPAP titration to 8cm H2O   PONV (postoperative nausea and vomiting)    PT DOESNOT WANT TO TAKE HER ATENOLOL     DOS.   " IT DROPS MY BP TO LOW"    Tobacco History: Social History   Tobacco Use  Smoking Status Never  Smokeless Tobacco Never   Counseling given: Not Answered   Continue to not smoke  Outpatient Encounter Medications as of 03/04/2023  Medication Sig    ACCU-CHEK FASTCLIX LANCETS MISC 1 each by Other route 2 (two) times daily. Use 1 lancet bid   ACCU-CHEK SMARTVIEW test strip 1 strip by Other route 2 (two) times daily. Use 1 strip to check glucose twice a day   albuterol (VENTOLIN HFA) 108 (90 Base) MCG/ACT inhaler Inhale 2 puffs into the lungs every 6 (six) hours as needed for wheezing or shortness of breath (Cough).   amLODipine (NORVASC) 5 MG  tablet Take 5 mg by mouth daily.   apixaban (ELIQUIS) 5 MG TABS tablet Take 1 tablet (5 mg total) by mouth 2 (two) times daily.   azelastine (ASTELIN) 0.1 % nasal spray Place 2 sprays into both nostrils 2 (two) times daily. Use in each nostril as directed   Cholecalciferol (VITAMIN D3) 1000 UNITS CAPS Take 1,000 Units by mouth daily.   fluticasone (FLONASE) 50 MCG/ACT nasal spray Place 1 spray into both nostrils daily.   hydrochlorothiazide (HYDRODIURIL) 25 MG tablet Take 25 mg by mouth daily.   levothyroxine (SYNTHROID, LEVOTHROID) 125 MCG tablet Take 125 mcg by mouth daily.   losartan (COZAAR) 100 MG tablet Take 100 mg by mouth daily.   metFORMIN (GLUCOPHAGE) 1000 MG tablet Take 1,000 mg by mouth 2 (two) times daily.   metoprolol tartrate (LOPRESSOR) 100 MG tablet Take one tablet 2 hours prior to cardiac CT   pantoprazole (PROTONIX) 40 MG tablet Take 1 tablet (40 mg total) by mouth daily.   rosuvastatin (CRESTOR) 5 MG tablet Take 5 mg by mouth 2 (two) times a week.   [DISCONTINUED] Azelastine-Fluticasone 137-50 MCG/ACT SUSP Place 2 sprays into the nose in the morning and at bedtime.   No facility-administered encounter medications on file as of 03/04/2023.     Review of Systems  Review of Systems  No chest pain with exertion, no orthopnea or pnd. Comprehensive ROS otherwise negative.  Physical Exam  BP 122/68 (BP Location: Left Arm, Patient Position: Sitting, Cuff Size: Normal)   Pulse 77   Ht '5\' 5"'$  (1.651 m)   Wt 179 lb 9.6 oz (81.5 kg)   SpO2 97%   BMI 29.89 kg/m   Wt Readings from  Last 5 Encounters:  03/04/23 179 lb 9.6 oz (81.5 kg)  02/19/23 186 lb (84.4 kg)  02/04/23 186 lb 6.4 oz (84.6 kg)  02/03/23 184 lb 12.8 oz (83.8 kg)  02/26/22 179 lb 2 oz (81.3 kg)    BMI Readings from Last 5 Encounters:  03/04/23 29.89 kg/m  02/19/23 30.95 kg/m  02/04/23 31.02 kg/m  02/03/23 29.83 kg/m  02/26/22 28.91 kg/m     Physical Exam General: sitting in chair, in NAD Eyes: EOMI, no icterus Neck: supple, no JVP appreciated Pulmonary: clear, NWOB on RA CV: RRR, no murmur, no edema Abdomen: ND, BS present MSK: no synovitis, no joint effusion Neuro:norma; gait, no weakness Psych: normal mood, full affect  Assessment & Plan:   Chronic cough. Present for months. Reports clear CXR at PCP. Started with what sounds like viral illness. Suspicion for asthma. Not improved with oral steroids.  No better with Breztri.  On PPI with minimal to no improvement.  Endorses postnasal drip.  Azelastine and Flonase sprays twice daily this time and individual components to help with cost.  Instructed to start cetirizine nightly.  Assess response.   Return in about 3 months (around 06/04/2023).   Lanier Clam, MD 03/04/2023

## 2023-03-04 NOTE — Telephone Encounter (Signed)
Patient had a televisit with Dr. Caryl Comes today. She needs to be set up for a left heart cath. Left message for patient to call back.

## 2023-03-08 ENCOUNTER — Ambulatory Visit: Payer: Medicare Other | Attending: Internal Medicine

## 2023-03-08 DIAGNOSIS — R072 Precordial pain: Secondary | ICD-10-CM

## 2023-03-08 DIAGNOSIS — I480 Paroxysmal atrial fibrillation: Secondary | ICD-10-CM

## 2023-03-08 LAB — CBC
Hematocrit: 29.5 % — ABNORMAL LOW (ref 34.0–46.6)
Hemoglobin: 9.6 g/dL — ABNORMAL LOW (ref 11.1–15.9)
MCH: 29.4 pg (ref 26.6–33.0)
MCHC: 32.5 g/dL (ref 31.5–35.7)
MCV: 91 fL (ref 79–97)
Platelets: 150 10*3/uL (ref 150–450)
RBC: 3.26 x10E6/uL — ABNORMAL LOW (ref 3.77–5.28)
RDW: 19 % — ABNORMAL HIGH (ref 11.7–15.4)
WBC: 2.7 10*3/uL — ABNORMAL LOW (ref 3.4–10.8)

## 2023-03-08 LAB — BASIC METABOLIC PANEL
BUN/Creatinine Ratio: 11 — ABNORMAL LOW (ref 12–28)
BUN: 18 mg/dL (ref 8–27)
CO2: 22 mmol/L (ref 20–29)
Calcium: 8.8 mg/dL (ref 8.7–10.3)
Chloride: 102 mmol/L (ref 96–106)
Creatinine, Ser: 1.68 mg/dL — ABNORMAL HIGH (ref 0.57–1.00)
Glucose: 92 mg/dL (ref 70–99)
Potassium: 4.2 mmol/L (ref 3.5–5.2)
Sodium: 135 mmol/L (ref 134–144)
eGFR: 31 mL/min/{1.73_m2} — ABNORMAL LOW (ref >59)

## 2023-03-10 ENCOUNTER — Telehealth: Payer: Self-pay | Admitting: Internal Medicine

## 2023-03-10 ENCOUNTER — Telehealth: Payer: Self-pay

## 2023-03-10 MED ORDER — LOSARTAN POTASSIUM 100 MG PO TABS: 50.0000 mg | ORAL_TABLET | Freq: Every day | ORAL | 3 refills | Status: DC

## 2023-03-10 NOTE — Telephone Encounter (Signed)
Tisha,  CMA for Dr. Forde Dandy called wanting to know if we are going to redraw labs, a CBD and iron on patient after her cath that is scheduled for next week.

## 2023-03-10 NOTE — Telephone Encounter (Signed)
Per Dr Caryl Comes - cancel cath and he will contact pt re: next steps.  Heart cath canceled as requested.

## 2023-03-10 NOTE — Telephone Encounter (Signed)
Spoke with Judie Petit who states she is going to fax pt's most recent labs from 12/2022 for Dr Olin Pia review.  She states Dr Forde Dandy has recommended repeat labs next week at his office.  Judie Petit states pt has also been advised to contact rheumatology to stop methotrexate.

## 2023-03-10 NOTE — Telephone Encounter (Signed)
-----   Message from Deboraha Sprang, MD sent at 03/08/2023  6:44 PM EDT ----- Please Inform Patient  Labs are abnormal showing recent worsening in renal function since J February as well as a new anemia since last checked 3/19 associated with a leukopenia.  I have reached out to Dr. Forde Dandy to see if he has more recent blood work and once we have some information from him, we can decide what organ to do about the catheterization which we have talked about In the interim, why do not we not have her decrease her Cozaar from 100---50  Thanks

## 2023-03-10 NOTE — Telephone Encounter (Signed)
Patient called to see if she can go through her procedure. Please advise

## 2023-03-10 NOTE — Telephone Encounter (Signed)
Dr Caryl Comes has contacted pt to personally discuss lab results.  I have contacted pt to advise per Dr Caryl Comes she should decrease her Losartan to '50mg'$  (1/2 tablet) daily.  Pt verbalizes understanding and agrees with current plan.

## 2023-03-10 NOTE — Telephone Encounter (Signed)
Attempted phone call Dr Baldwin Crown office and requested to speak with Tisha.  Connected to voicemail of Maudie Mercury and left voicemail message to contact RN at [phone].

## 2023-03-15 ENCOUNTER — Encounter (HOSPITAL_COMMUNITY): Admission: RE | Payer: Self-pay | Source: Home / Self Care

## 2023-03-15 ENCOUNTER — Ambulatory Visit (HOSPITAL_COMMUNITY): Admission: RE | Admit: 2023-03-15 | Payer: Medicare Other | Source: Home / Self Care | Admitting: Cardiology

## 2023-03-15 SURGERY — LEFT HEART CATH AND CORONARY ANGIOGRAPHY
Anesthesia: LOCAL

## 2023-03-17 ENCOUNTER — Encounter: Payer: Self-pay | Admitting: Endocrinology

## 2023-03-27 NOTE — Telephone Encounter (Signed)
Lets please schedulre her for cath with abnormal CT angio Thanks SK

## 2023-04-01 ENCOUNTER — Encounter: Payer: Self-pay | Admitting: Internal Medicine

## 2023-04-01 LAB — LAB REPORT - SCANNED: EGFR: 33.6

## 2023-04-01 NOTE — Progress Notes (Signed)
Spoke with Dr. Forde Dandy today.  Her hemoglobin has continued to go down, repeat chemistries will be repeated today as well as another hemoglobin.  Referral for evaluation of her anemia but I think in the context of the anemia being so profound at this point DAPT would not be indicated in the absence of more significant symptoms, and so we will hold off on catheterization until the waters have cleared related to her chemistries as well as her anemia.  We will call her and let her know that she will be hearing from Dr. Forde Dandy and that we will wait to hear also from Dr. Pasty Arch before we take the neck step as long as her symptoms remain stable

## 2023-04-01 NOTE — Telephone Encounter (Signed)
Destiny Sprang, MD (Physician)             Spoke with Dr. Forde Dandy today.  Her hemoglobin has continued to go down, repeat chemistries will be repeated today as well as another hemoglobin.  Referral for evaluation of her anemia but I think in the context of the anemia being so profound at this point DAPT would not be indicated in the absence of more significant symptoms, and so we will hold off on catheterization until the waters have cleared related to her chemistries as well as her anemia.   We will call her and let her know that she will be hearing from Dr. Forde Dandy and that we will wait to hear also from Dr. Pasty Arch before we take the neck step as long as her symptoms remain stable

## 2023-04-01 NOTE — H&P (View-Only) (Signed)
Spoke with Dr. South today.  Her hemoglobin has continued to go down, repeat chemistries will be repeated today as well as another hemoglobin.  Referral for evaluation of her anemia but I think in the context of the anemia being so profound at this point DAPT would not be indicated in the absence of more significant symptoms, and so we will hold off on catheterization until the waters have cleared related to her chemistries as well as her anemia.  We will call her and let her know that she will be hearing from Dr. South and that we will wait to hear also from Dr. Self before we take the neck step as long as her symptoms remain stable 

## 2023-04-02 NOTE — Telephone Encounter (Signed)
Spoke with pt and advised of information below per Dr Graciela Husbands.  Pt states she has been able to schedule an appointment with GI and has had blood work repeated with Dr Rinaldo Cloud office. Pt states she will await to hear next steps from PCP.

## 2023-04-02 NOTE — Telephone Encounter (Signed)
Attempted phone call to pt and left voicemail message to contact RN at [phone]. 

## 2023-04-05 ENCOUNTER — Telehealth: Payer: Self-pay | Admitting: *Deleted

## 2023-04-05 NOTE — Telephone Encounter (Signed)
   Pre-operative Risk Assessment    Patient Name: Destiny Brown  DOB: 1946/10/23 MRN: [id number]      Request for Surgical Clearance    Procedure:   EGD AND COLONOSCOPY  Date of Surgery:  Clearance 04/23/23                                 Surgeon:  DR. HUNG Surgeon's Group or Practice Name: [facility] Phone number:  [phone] Fax number:  [phone]   Type of Clearance Requested:   - Medical  - Pharmacy:  Hold Apixaban (Eliquis)     Type of Anesthesia:   PROPOFOL   Additional requests/questions:    Elpidio Anis   04/05/2023, 5:40 PM

## 2023-04-06 NOTE — Telephone Encounter (Signed)
Patient with diagnosis of atrial fibrillation on Eliquis for anticoagulation.    Procedure: EGD and colonoscopy Date of procedure: 04/23/23   CHA2DS2-VASc Score = 5   This indicates a 7.2% annual risk of stroke. The patient's score is based upon: CHF History: 0 HTN History: 1 Diabetes History: 1 Stroke History: 0 Vascular Disease History: 0 Age Score: 2 Gender Score: 1    CrCl 36 Platelet count 150  Per office protocol, patient can hold Eliquis for 2 days prior to procedure.   Patient will not need bridging with Lovenox (enoxaparin) around procedure.  **This guidance is not considered finalized until pre-operative APP has relayed final recommendations.**

## 2023-04-06 NOTE — Telephone Encounter (Signed)
Dr. Graciela Husbands, you recently saw Ms. Bordley in the clinic on 03/04/2023.  We received a preoperative cardiac clearance request for EGD and colonoscopy.   She has PMH of atrial fibrillation with RVR, diabetes, hypertension, hypothyroid, LBBB, OSA, and palpitations.  Echocardiogram 02/24/2023 showed an LVEF of 55-60%, G1 DD, mildly dilated left atria, trivial mitral valve regurgitation, mild aortic valve calcification and no other significant valvular abnormalities.  During her clinic evaluation she reported chest pain and dyspnea.  Cardiac catheterization was also discussed.   Please advise on preoperative cardiac risk.  Please direct your response to CV DIV preop pool.    Thank you for your help.  Thomasene Ripple. Shahmeer Bunn NP-C     04/06/2023, 4:48 PM [facility] [address] Office [phone] Fax [phone]

## 2023-04-12 ENCOUNTER — Other Ambulatory Visit: Payer: Self-pay | Admitting: Gastroenterology

## 2023-04-12 NOTE — Telephone Encounter (Signed)
Ongoing issues with PCP and  there is need for cath prior to procedures

## 2023-04-13 NOTE — Telephone Encounter (Signed)
I will update the requesting office to see notes per Dr. Graciela Husbands pt needs in office appt to discuss a heart cath before the pt can be cleared for her procedure.

## 2023-04-13 NOTE — Telephone Encounter (Signed)
I will update all parties involved.  

## 2023-04-13 NOTE — Telephone Encounter (Signed)
See notes pt has appt with Dr. Graciela Husbands 05/03/23. Pt will need to post pone procedure until she has been seen by Dr. Graciela Husbands. Please be sure to read all notes. I will update all parties involved.

## 2023-04-13 NOTE — Telephone Encounter (Signed)
I will send a message to EP scheduler [place] to reach out to the pt with an appt with EP for pre op clearance and to discuss about a cath per Dr. Graciela Husbands.

## 2023-04-13 NOTE — Telephone Encounter (Signed)
FYI-    FYI.  I will be a endoscopy/colonoscopy on April 26th at [facility]. On April 6th I have a visit with Dr. Graciela Husbands to decide the next step regarding the Catherization.   I am doing well on the coughing and the other issues Dr. Judeth Horn saw me about.  I will not be back for a checkup until after all these major issues are resolved.

## 2023-04-14 NOTE — Telephone Encounter (Signed)
Dr. Graciela Husbands the most recent lab work is from 03/08/23 that you had ordered. Please advise if we need new lab work.

## 2023-04-14 NOTE — Telephone Encounter (Signed)
Can we get from [facility] or [facility] most recent blood work Thanks SK

## 2023-04-16 ENCOUNTER — Telehealth: Payer: Self-pay | Admitting: Internal Medicine

## 2023-04-16 ENCOUNTER — Encounter (HOSPITAL_COMMUNITY): Payer: Self-pay | Admitting: Gastroenterology

## 2023-04-16 NOTE — Telephone Encounter (Signed)
Dr. Graciela Husbands, Dr. Elnoria Howard is requesting clearance to proceed with colonoscopy due to anemia. Could you please provide guidance regarding next steps? Does she need cardiology office visit to discuss chest pain prior to proceeding with colonscopy?  Thank you, Marcelino Duster

## 2023-04-16 NOTE — Telephone Encounter (Signed)
Pt would like a callback regarding the process of the medical clearance due to her procedure being 04/23/23. Please advise

## 2023-04-16 NOTE — Telephone Encounter (Signed)
Pre op APP today was reaching out to Dr. Graciela Husbands

## 2023-04-19 ENCOUNTER — Telehealth: Payer: Self-pay | Admitting: Internal Medicine

## 2023-04-19 ENCOUNTER — Encounter: Payer: Self-pay | Admitting: Internal Medicine

## 2023-04-19 NOTE — Telephone Encounter (Signed)
I will send to pre op pool and to Dr. Graciela Husbands. Pt's procedure is scheduled for 04/23/23

## 2023-04-19 NOTE — Telephone Encounter (Signed)
Patient is calling to check on the status of her pre op clearance from Dr. Graciela Husbands, she said she called last week and is waiting to hear back.

## 2023-04-19 NOTE — Telephone Encounter (Signed)
I called and left patient a message that we are awaiting Dr. Odessa Fleming return to the office for follow-up. Unfortunately there is nothing further that I can do today. I did advise that a 2-day hold is all that is required for her Eliquis for upcoming colonoscopy, she does not need to hold Eliquis tomorrow 4/23.  Levi Aland, NP-C  04/19/2023, 4:11 PM [address], [address] Office [phone] Fax [phone]

## 2023-04-20 ENCOUNTER — Ambulatory Visit: Payer: Medicare Other | Attending: Physician Assistant | Admitting: Physician Assistant

## 2023-04-20 ENCOUNTER — Encounter: Payer: Self-pay | Admitting: Physician Assistant

## 2023-04-20 VITALS — BP 140/74 | HR 75 | Ht 65.0 in | Wt 189.4 lb

## 2023-04-20 DIAGNOSIS — I2510 Atherosclerotic heart disease of native coronary artery without angina pectoris: Secondary | ICD-10-CM | POA: Insufficient documentation

## 2023-04-20 DIAGNOSIS — Z01810 Encounter for preprocedural cardiovascular examination: Secondary | ICD-10-CM | POA: Insufficient documentation

## 2023-04-20 DIAGNOSIS — I10 Essential (primary) hypertension: Secondary | ICD-10-CM

## 2023-04-20 DIAGNOSIS — I480 Paroxysmal atrial fibrillation: Secondary | ICD-10-CM

## 2023-04-20 DIAGNOSIS — I25119 Atherosclerotic heart disease of native coronary artery with unspecified angina pectoris: Secondary | ICD-10-CM

## 2023-04-20 NOTE — Progress Notes (Cosign Needed Addendum)
Cardiology Office Note:    Date:  04/20/2023  ID:  [name], DOB 1946/09/18, MRN [id number] PCP: Adrian Prince, MD  [facility] Providers Cardiologist:  Sherryl Manges, MD       Patient Profile:   Paroxysmal Atrial fibrillation  Coronary artery disease TTE 02/24/2023: EF 55-60, mild LVH, inferior HK, G1 DD, mild LAE, trivial MR, AV sclerosis, RAP 3 CCTA 02/13/2023: Proximal LAD >70, proximal LCx 50-69, proximal OM1 50-69; CAC score 457 (81st percentile); FFR LAD 0.8, 0.75; FFR LCx/OM 0.76 Left Bundle Branch Block OSA  Hypertension  Diabetes mellitus  Hypothyroidism  Rheumatoid arthritis  Asthma     History of Present Illness:   Destiny Brown is a 77 y.o. female who is added on to my schedule today to arrange cardiac catheterization. She was seen by Dr. Graciela Husbands in Feb 2024 and noted chest pain and shortness of breath. CCTA was obtained and demonstrated hemodynamically significant stenoses in the LAD and LCx by FFR. Cardiac catheterization was arranged in March, but her Hgb was lower and her SCr was higher. Her cardiac catheterization was postponed. She has been evaluated by GI with plans to proceed with colonoscopy. Dr. Graciela Husbands has recommended proceeding with cardiac catheterization prior to proceeding with colonoscopy. Her Hgb has improved over time.  Labs from primary care received and personally reviewed. 04/01/23: Creatinine 1.5, K+ 4.9, Hgb 10.6, MCV 91.3.  Her Hgb in October 2023 was 11.  Hgb 03/08/2023 was 9.6.  She is here w her husband. She notes continued dyspnea on exertion and exercise intolerance. She feels this has progressively worsened. She has not had chest pain or rest symptoms. She has not had syncope, orthopnea, significant edema. She has never smoked cigarettes. She does not know her family hx. She is adopted.   Review of Systems  Constitutional: Negative for fever.  Gastrointestinal:  Negative for hematochezia and melena.  Genitourinary:  Negative for  hematuria.  Neurological:  Positive for loss of balance.  See HPI    Studies Reviewed:    EKG:  NSR, HR 75, Left Bundle Branch Block   Risk Assessment/Calculations:    CHA2DS2-VASc Score = 6   This indicates a 9.7% annual risk of stroke. The patient's score is based upon: CHF History: 0 HTN History: 1 Diabetes History: 1 Stroke History: 0 Vascular Disease History: 1 Age Score: 2 Gender Score: 1    HYPERTENSION CONTROL Vitals:   04/20/23 1043 04/20/23 1228  BP: (!) 144/60 (!) 140/74    The patient's blood pressure is elevated above target today.  In order to address the patient's elevated BP: Blood pressure will be monitored at home to determine if medication changes need to be made.          Physical Exam:   VS:  BP (!) 140/74   Pulse 75   Ht  (1.651 m)   Wt 189 lb 6.4 oz (85.9 kg)   SpO2 99%   BMI 31.52 kg/m    Wt Readings from Last 3 Encounters:  04/20/23 189 lb 6.4 oz (85.9 kg)  03/04/23 179 lb 9.6 oz (81.5 kg)  03/04/23 179 lb 9.6 oz (81.5 kg)    Constitutional:      Appearance: Healthy appearance. Not in distress.  Neck:     Vascular: JVD normal.  Pulmonary:     Breath sounds: Normal breath sounds. No wheezing. No rales.  Cardiovascular:     Normal rate. Regular rhythm. Normal S1. Normal S2.  Murmurs: There is no murmur.  Edema:    Peripheral edema present.    Ankle: bilateral trace edema of the ankle. Abdominal:     Palpations: Abdomen is soft.  Skin:    General: Skin is warm and dry.  Neurological:     General: No focal deficit present.     Mental Status: Alert and oriented to person, place and time.      ASSESSMENT AND PLAN:   CAD (coronary artery disease) She had a CCTA in Feb 2024 that demonstrated hemodynamically significant coronary artery disease in the LAD and LCx/OM by FFR. She has exertional shortness of breath and exercise intolerance that is likely an anginal equivalent. EF was normal by echocardiogram in Feb 2024 by  echocardiogram. She has seen pulmonology for possible asthma without much improvement in symptoms. Her cardiac catheterization was originally scheduled in March but canceled due to anemia and chronic kidney disease. She is seeing GI with plans to proceed with colonoscopy. However, b/c of her coronary artery disease on CCTA, it is felt that she needs cardiac catheterization prior to undergoing colonoscopy. Her Hgb has improved from 9.6 to 10.6 and her Creatinine has improved to 1.5. I reviewed her CT-FFR findings with her and her husband today. Risks and benefits were reviewed and she agrees to proceed. Her case was reviewed again with Dr. Graciela Husbands who agreed that she should undergo cardiac catheterization. Arrange cardiac catheterization Keep f/u with Dr. Graciela Husbands in May. BMET, CBC today Continue ASA 81 mg once daily, Crestor 5 mg twice weekly, Amlodipine 5 mg once daily  Preoperative cardiovascular examination As noted, she needs a colonoscopy. This will need to be postponed until her cardiac catheterization is completed.  Paroxysmal A-fib (HCC) Maintaining normal sinus rhythm. Age < 80, Wt > 60 kg. Continue Eliquis 5 mg twice daily.   Hypertension BP somewhat borderline today. She notes normal BPs at home. Continue Amlodipine 5 mg once daily, Losartan 50 mg once daily.    Shared Decision Making/Informed Consent The risks [stroke (1 in 1000), death (1 in 1000), kidney failure [usually temporary] (1 in 500), bleeding (1 in 200), allergic reaction [possibly serious] (1 in 200)], benefits (diagnostic support and management of coronary artery disease) and alternatives of a cardiac catheterization were discussed in detail with Ms. Iodice and she is willing to proceed.   Dispo:  Return in 13 days (on 05/03/2023) for Scheduled Follow Up w/ Dr. Graciela Husbands.  Signed, Tereso Newcomer, PA-C    Note reviewed. Agree as outlined. Pt presents for cardiac cath possible PCI today. Noted to have moderate MV CAD with abnormal  CT-FFR.   Tonny Bollman 04/23/2023 9:57 AM

## 2023-04-20 NOTE — Patient Instructions (Signed)
Medication Instructions:  Your physician recommends that you continue on your current medications as directed. Please refer to the Current Medication list given to you today.  *If you need a refill on your cardiac medications before your next appointment, please call your pharmacy*   Lab Work: TODAY:  BMET & CBC  If you have labs (blood work) drawn today and your tests are completely normal, you will receive your results only by: MyChart Message (if you have MyChart) OR A paper copy in the mail If you have any lab test that is abnormal or we need to change your treatment, we will call you to review the results.   Testing/Procedures: Your physician has requested that you have a cardiac catheterization. Cardiac catheterization is used to diagnose and/or treat various heart conditions. Doctors may recommend this procedure for a number of different reasons. The most common reason is to evaluate chest pain. Chest pain can be a symptom of coronary artery disease (CAD), and cardiac catheterization can show whether plaque is narrowing or blocking your heart's arteries. This procedure is also used to evaluate the valves, as well as measure the blood flow and oxygen levels in different parts of your heart. For further information please visit [email or web address] Please follow instruction sheet, as BELOW:        Cardiac/Peripheral Catheterization   You are scheduled for a Cardiac Catheterization on Friday, April 26 with Dr. Tonny Bollman.  1. Please arrive at the [facility] (Main Entrance A) at [facility]: [address] [place], [place] [address] at 8:30 AM (This time is two hours before your procedure to ensure your preparation). Free valet parking service is available. You will check in at ADMITTING. The support person will be asked to wait in the waiting room.  It is OK to have someone drop you off and come back when you are ready to be discharged.        Special note: Every effort  is made to have your procedure done on time. Please understand that emergencies sometimes delay scheduled procedures.  2. Diet: Do not eat solid foods after midnight.  You may have clear liquids until 5 AM the day of the procedure.  3. Labs: You will need to have blood drawn on TODAY  4. Medication instructions in preparation for your procedure:   Contrast Allergy: No   Stop taking Eliquis (Apixiban) on Wednesday, April 24.  TONIGHT, TUESDAY, 04/20/23, WILL BE YOUR LAST DOSE UNTIL AFTER YOUR CATH   Stop taking, HYDROCHLOROTHIAZIDE THE MORNING OF   Do not take Diabetes Med Glucophage (Metformin) on the day of the procedure and HOLD 48 HOURS AFTER THE PROCEDURE.  On the morning of your procedure, take Aspirin 81 mg and any morning medicines NOT listed above.  You may use sips of water.  5. Plan to go home the same day, you will only stay overnight if medically necessary. 6. You MUST have a responsible adult to drive you home. 7. An adult MUST be with you the first 24 hours after you arrive home. 8. Bring a current list of your medications, and the last time and date medication taken. 9. Bring ID and current insurance cards. 10.Please wear clothes that are easy to get on and off and wear slip-on shoes.  Thank you for allowing [place] to care for you!   -- [facility]    Follow-Up: At [facility], you and your health needs are our priority.  As part  of our continuing mission to provide you with exceptional heart care, we have created designated Provider Care Teams.  These Care Teams include your primary Cardiologist (physician) and Advanced Practice Providers (APPs -  Physician Assistants and Nurse Practitioners) who all work together to provide you with the care you need, when you need it.  We recommend signing up for the patient portal called "MyChart".  Sign up information is provided on this After Visit Summary.  MyChart is used to connect with  patients for Virtual Visits (Telemedicine).  Patients are able to view lab/test results, encounter notes, upcoming appointments, etc.  Non-urgent messages can be sent to your provider as well.   To learn more about what you can do with MyChart, go to [email or web address].    Your next appointment:   AS SCHEDULED   Provider:   Sherryl Manges, MD     Other Instructions

## 2023-04-20 NOTE — Assessment & Plan Note (Signed)
As noted, she needs a colonoscopy. This will need to be postponed until her cardiac catheterization is completed.

## 2023-04-20 NOTE — Assessment & Plan Note (Signed)
Maintaining normal sinus rhythm. Age < 80, Wt > 60 kg. Continue Eliquis 5 mg twice daily.

## 2023-04-20 NOTE — Telephone Encounter (Signed)
Called and spoke with patient and advised that per Dr. Graciela Husbands, she should proceed with cardiac cath. Patient will come in today to see Tereso Newcomer, PA at 10:30. Patient thanked me for the call.

## 2023-04-20 NOTE — Assessment & Plan Note (Signed)
She had a CCTA in Feb 2024 that demonstrated hemodynamically significant coronary artery disease in the LAD and LCx/OM by FFR. She has exertional shortness of breath and exercise intolerance that is likely an anginal equivalent. EF was normal by echocardiogram in Feb 2024 by echocardiogram. She has seen pulmonology for possible asthma without much improvement in symptoms. Her cardiac catheterization was originally scheduled in March but canceled due to anemia and chronic kidney disease. She is seeing GI with plans to proceed with colonoscopy. However, b/c of her coronary artery disease on CCTA, it is felt that she needs cardiac catheterization prior to undergoing colonoscopy. Her Hgb has improved from 9.6 to 10.6 and her Creatinine has improved to 1.5. I reviewed her CT-FFR findings with her and her husband today. Risks and benefits were reviewed and she agrees to proceed. Her case was reviewed again with Dr. Graciela Husbands who agreed that she should undergo cardiac catheterization. Arrange cardiac catheterization Keep f/u with Dr. Graciela Husbands in May. BMET, CBC today Continue ASA 81 mg once daily, Crestor 5 mg twice weekly, Amlodipine 5 mg once daily

## 2023-04-20 NOTE — Assessment & Plan Note (Signed)
BP somewhat borderline today. She notes normal BPs at home. Continue Amlodipine 5 mg once daily, Losartan 50 mg once daily.

## 2023-04-21 ENCOUNTER — Telehealth: Payer: Self-pay | Admitting: *Deleted

## 2023-04-21 LAB — CBC
Hematocrit: 34.4 % (ref 34.0–46.6)
Hemoglobin: 10.9 g/dL — ABNORMAL LOW (ref 11.1–15.9)
MCH: 28.8 pg (ref 26.6–33.0)
MCHC: 31.7 g/dL (ref 31.5–35.7)
MCV: 91 fL (ref 79–97)
Platelets: 219 10*3/uL (ref 150–450)
RBC: 3.79 x10E6/uL (ref 3.77–5.28)
RDW: 14.4 % (ref 11.7–15.4)
WBC: 7.3 10*3/uL (ref 3.4–10.8)

## 2023-04-21 LAB — BASIC METABOLIC PANEL
BUN/Creatinine Ratio: 18 (ref 12–28)
BUN: 21 mg/dL (ref 8–27)
CO2: 19 mmol/L — ABNORMAL LOW (ref 20–29)
Calcium: 9.8 mg/dL (ref 8.7–10.3)
Chloride: 98 mmol/L (ref 96–106)
Creatinine, Ser: 1.19 mg/dL — ABNORMAL HIGH (ref 0.57–1.00)
Glucose: 168 mg/dL — ABNORMAL HIGH (ref 70–99)
Potassium: 4.3 mmol/L (ref 3.5–5.2)
Sodium: 135 mmol/L (ref 134–144)
eGFR: 47 mL/min/{1.73_m2} — ABNORMAL LOW (ref >59)

## 2023-04-21 NOTE — Telephone Encounter (Signed)
Cardiac Catheterization scheduled at [facility] for: Friday April 23, 2023 10:30 AM Arrival time [facility] Main Entrance A at: 8:30 AM  Nothing to eat after midnight prior to procedure, clear liquids until 5 AM day of procedure.  Medication instructions: -Hold:  Eliquis-none 04/21/23 until post procedure  HCTZ/Losartan-day before and day of procedure -per protocol GFR 47  Metformin-day of procedure and 48 hours post procedure -Other usual morning medications can be taken with sips of water including aspirin 81 mg.  Confirmed patient has responsible adult to drive home post procedure and be with patient first 24 hours after arriving home.  Plan to go home the same day, you will only stay overnight if medically necessary.  Reviewed procedure instructions with patient.

## 2023-04-23 ENCOUNTER — Ambulatory Visit (HOSPITAL_COMMUNITY)
Admission: RE | Admit: 2023-04-23 | Discharge: 2023-04-23 | Disposition: A | Discharge status: Home / Self Care | Payer: Medicare Other | Attending: Cardiovascular Disease | Admitting: Cardiovascular Disease

## 2023-04-23 ENCOUNTER — Encounter (HOSPITAL_COMMUNITY)
Admission: RE | Disposition: A | Discharge status: Home / Self Care | Payer: Self-pay | Source: Home / Self Care | Attending: Cardiovascular Disease

## 2023-04-23 ENCOUNTER — Other Ambulatory Visit: Payer: Self-pay

## 2023-04-23 ENCOUNTER — Other Ambulatory Visit (HOSPITAL_COMMUNITY): Payer: Self-pay

## 2023-04-23 DIAGNOSIS — R931 Abnormal findings on diagnostic imaging of heart and coronary circulation: Secondary | ICD-10-CM | POA: Diagnosis present

## 2023-04-23 DIAGNOSIS — I10 Essential (primary) hypertension: Secondary | ICD-10-CM | POA: Insufficient documentation

## 2023-04-23 DIAGNOSIS — Z7901 Long term (current) use of anticoagulants: Secondary | ICD-10-CM | POA: Diagnosis not present

## 2023-04-23 DIAGNOSIS — I2510 Atherosclerotic heart disease of native coronary artery without angina pectoris: Secondary | ICD-10-CM | POA: Diagnosis not present

## 2023-04-23 DIAGNOSIS — I447 Left bundle-branch block, unspecified: Secondary | ICD-10-CM | POA: Diagnosis not present

## 2023-04-23 DIAGNOSIS — I480 Paroxysmal atrial fibrillation: Secondary | ICD-10-CM | POA: Insufficient documentation

## 2023-04-23 DIAGNOSIS — Z955 Presence of coronary angioplasty implant and graft: Secondary | ICD-10-CM | POA: Diagnosis not present

## 2023-04-23 DIAGNOSIS — E785 Hyperlipidemia, unspecified: Secondary | ICD-10-CM | POA: Insufficient documentation

## 2023-04-23 DIAGNOSIS — D649 Anemia, unspecified: Secondary | ICD-10-CM | POA: Insufficient documentation

## 2023-04-23 DIAGNOSIS — R0609 Other forms of dyspnea: Secondary | ICD-10-CM | POA: Diagnosis not present

## 2023-04-23 DIAGNOSIS — E119 Type 2 diabetes mellitus without complications: Secondary | ICD-10-CM | POA: Diagnosis not present

## 2023-04-23 DIAGNOSIS — I25119 Atherosclerotic heart disease of native coronary artery with unspecified angina pectoris: Secondary | ICD-10-CM

## 2023-04-23 HISTORY — PX: CORONARY STENT INTERVENTION: CATH118234

## 2023-04-23 HISTORY — PX: LEFT HEART CATH AND CORONARY ANGIOGRAPHY: CATH118249

## 2023-04-23 LAB — GLUCOSE, CAPILLARY: Glucose-Capillary: 147 mg/dL — ABNORMAL HIGH (ref 70–99)

## 2023-04-23 LAB — POCT ACTIVATED CLOTTING TIME: Activated Clotting Time: 271 seconds

## 2023-04-23 SURGERY — LEFT HEART CATH AND CORONARY ANGIOGRAPHY
Anesthesia: LOCAL

## 2023-04-23 MED ORDER — SODIUM CHLORIDE 0.9 % WEIGHT BASED INFUSION: 1.0000 mL/kg/h | INTRAVENOUS | Status: DC

## 2023-04-23 MED ORDER — MIDAZOLAM HCL 2 MG/2ML IJ SOLN
INTRAMUSCULAR | Status: DC | PRN
  Administered 2023-04-23 (×2): 1 mg via INTRAVENOUS

## 2023-04-23 MED ORDER — CLOPIDOGREL BISULFATE 300 MG PO TABS
ORAL_TABLET | ORAL | Status: AC
  Filled 2023-04-23: qty 2

## 2023-04-23 MED ORDER — FAMOTIDINE IN NACL 20-0.9 MG/50ML-% IV SOLN
INTRAVENOUS | Status: AC
  Filled 2023-04-23: qty 50

## 2023-04-23 MED ORDER — ONDANSETRON HCL 4 MG/2ML IJ SOLN: 4.0000 mg | Freq: Four times a day (QID) | INTRAMUSCULAR | Status: DC | PRN

## 2023-04-23 MED ORDER — HYDRALAZINE HCL 20 MG/ML IJ SOLN: 10.0000 mg | INTRAMUSCULAR | Status: DC | PRN

## 2023-04-23 MED ORDER — SODIUM CHLORIDE 0.9 % WEIGHT BASED INFUSION
3.0000 mL/kg/h | INTRAVENOUS | Status: AC
  Administered 2023-04-23: 3 mL/kg/h via INTRAVENOUS

## 2023-04-23 MED ORDER — DIAZEPAM 5 MG PO TABS: 5.0000 mg | ORAL_TABLET | Freq: Three times a day (TID) | ORAL | Status: DC | PRN

## 2023-04-23 MED ORDER — SODIUM CHLORIDE 0.9 % IV SOLN: 250.0000 mL | INTRAVENOUS | Status: DC | PRN

## 2023-04-23 MED ORDER — CLOPIDOGREL BISULFATE 75 MG PO TABS
75.0000 mg | ORAL_TABLET | Freq: Every day | ORAL | 0 refills | Status: DC
Start: 2023-04-23 — End: 2023-04-23

## 2023-04-23 MED ORDER — VERAPAMIL HCL 2.5 MG/ML IV SOLN
INTRAVENOUS | Status: AC
  Filled 2023-04-23: qty 2

## 2023-04-23 MED ORDER — IOHEXOL 350 MG/ML SOLN
INTRAVENOUS | Status: DC | PRN
  Administered 2023-04-23: 110 mL via INTRA_ARTERIAL

## 2023-04-23 MED ORDER — FENTANYL CITRATE (PF) 100 MCG/2ML IJ SOLN
INTRAMUSCULAR | Status: DC | PRN
  Administered 2023-04-23 (×2): 25 ug via INTRAVENOUS

## 2023-04-23 MED ORDER — CLOPIDOGREL BISULFATE 75 MG PO TABS
75.0000 mg | ORAL_TABLET | Freq: Every day | ORAL | 2 refills | Status: DC
Start: 2023-04-23 — End: 2023-11-01
  Filled 2023-04-23: qty 30, 30d supply, fill #0

## 2023-04-23 MED ORDER — LABETALOL HCL 5 MG/ML IV SOLN: 10.0000 mg | INTRAVENOUS | Status: DC | PRN

## 2023-04-23 MED ORDER — MIDAZOLAM HCL 2 MG/2ML IJ SOLN
INTRAMUSCULAR | Status: AC
  Filled 2023-04-23: qty 2

## 2023-04-23 MED ORDER — SODIUM CHLORIDE 0.9% FLUSH: 3.0000 mL | INTRAVENOUS | Status: DC | PRN

## 2023-04-23 MED ORDER — VERAPAMIL HCL 2.5 MG/ML IV SOLN
INTRAVENOUS | Status: DC | PRN
  Administered 2023-04-23: 10 mL via INTRA_ARTERIAL

## 2023-04-23 MED ORDER — HEPARIN SODIUM (PORCINE) 1000 UNIT/ML IJ SOLN
INTRAMUSCULAR | Status: DC | PRN
  Administered 2023-04-23: 5000 [IU] via INTRAVENOUS
  Administered 2023-04-23: 3000 [IU] via INTRAVENOUS
  Administered 2023-04-23: 2000 [IU] via INTRAVENOUS

## 2023-04-23 MED ORDER — SODIUM CHLORIDE 0.9% FLUSH: 3.0000 mL | Freq: Two times a day (BID) | INTRAVENOUS | Status: DC

## 2023-04-23 MED ORDER — HEPARIN (PORCINE) IN NACL 1000-0.9 UT/500ML-% IV SOLN
INTRAVENOUS | Status: DC | PRN
  Administered 2023-04-23 (×2): 500 mL via INTRA_ARTERIAL

## 2023-04-23 MED ORDER — HEPARIN SODIUM (PORCINE) 1000 UNIT/ML IJ SOLN
INTRAMUSCULAR | Status: AC
  Filled 2023-04-23: qty 10

## 2023-04-23 MED ORDER — CLOPIDOGREL BISULFATE 300 MG PO TABS
ORAL_TABLET | ORAL | Status: DC | PRN
  Administered 2023-04-23: 600 mg via ORAL

## 2023-04-23 MED ORDER — LIDOCAINE HCL (PF) 1 % IJ SOLN
INTRAMUSCULAR | Status: DC | PRN
  Administered 2023-04-23: 5 mL

## 2023-04-23 MED ORDER — NITROGLYCERIN 1 MG/10 ML FOR IR/CATH LAB
INTRA_ARTERIAL | Status: DC | PRN
  Administered 2023-04-23 (×2): 150 ug via INTRACORONARY

## 2023-04-23 MED ORDER — FAMOTIDINE IN NACL 20-0.9 MG/50ML-% IV SOLN
INTRAVENOUS | Status: DC | PRN
  Administered 2023-04-23: 20 mg via INTRAVENOUS

## 2023-04-23 MED ORDER — FENTANYL CITRATE (PF) 100 MCG/2ML IJ SOLN: 25.0000 ug | INTRAMUSCULAR | Status: DC | PRN

## 2023-04-23 MED ORDER — CLOPIDOGREL BISULFATE 75 MG PO TABS: 75.0000 mg | ORAL_TABLET | Freq: Every day | ORAL | Status: DC

## 2023-04-23 MED ORDER — ASPIRIN 81 MG PO CHEW: 81.0000 mg | CHEWABLE_TABLET | ORAL | Status: DC

## 2023-04-23 MED ORDER — LIDOCAINE HCL (PF) 1 % IJ SOLN
INTRAMUSCULAR | Status: AC
  Filled 2023-04-23: qty 30

## 2023-04-23 MED ORDER — FENTANYL CITRATE (PF) 100 MCG/2ML IJ SOLN
INTRAMUSCULAR | Status: AC
  Filled 2023-04-23: qty 2

## 2023-04-23 MED ORDER — NITROGLYCERIN 1 MG/10 ML FOR IR/CATH LAB
INTRA_ARTERIAL | Status: AC
  Filled 2023-04-23: qty 10

## 2023-04-23 SURGICAL SUPPLY — 20 items
BALLN EMERGE MR 2.5X15 (BALLOONS) ×1
BALLN ~~LOC~~ EMERGE MR 3.0X20 (BALLOONS) ×1
BALLOON EMERGE MR 2.5X15 (BALLOONS) IMPLANT
BALLOON ~~LOC~~ EMERGE MR 3.0X20 (BALLOONS) IMPLANT
CATH 5FR JL3.5 JR4 ANG PIG MP (CATHETERS) IMPLANT
CATH LAUNCHER 5F EBU3.5 (CATHETERS) IMPLANT
DEVICE RAD TR BAND REGULAR (VASCULAR PRODUCTS) IMPLANT
GLIDESHEATH SLEND SS 6F .021 (SHEATH) IMPLANT
GUIDEWIRE INQWIRE 1.5J.035X260 (WIRE) IMPLANT
INQWIRE 1.5J .035X260CM (WIRE) ×1
KIT ENCORE 26 ADVANTAGE (KITS) IMPLANT
KIT HEART LEFT (KITS) ×2 IMPLANT
KIT HEMO VALVE WATCHDOG (MISCELLANEOUS) IMPLANT
PACK CARDIAC CATHETERIZATION (CUSTOM PROCEDURE TRAY) ×2 IMPLANT
SHEATH PROBE COVER 6X72 (BAG) IMPLANT
STENT SYNERGY XD 2.75X28 (Permanent Stent) IMPLANT
SYNERGY XD 2.75X28 (Permanent Stent) ×1 IMPLANT
TRANSDUCER W/STOPCOCK (MISCELLANEOUS) ×2 IMPLANT
TUBING CIL FLEX 10 FLL-RA (TUBING) ×2 IMPLANT
WIRE COUGAR XT STRL 190CM (WIRE) IMPLANT

## 2023-04-23 NOTE — Discharge Instructions (Addendum)
NO METFORMIN FOR 2 DAYS. Resume Eliquis tomorrow morning.

## 2023-04-23 NOTE — Progress Notes (Signed)
Discussed with pt and husband stent, Plavix, restrictions, diet, exercise, NTG and CRPII. Pt receptive. Will refer to [facility] CRPII.  [id number] Ethelda Chick BS, ACSM-CEP 04/23/2023 3:05 PM

## 2023-04-23 NOTE — Progress Notes (Signed)
Pt ambulated without difficulty or bleeding.   Discharged home with husband who will drive and stay with pt x 24 hrs  

## 2023-04-23 NOTE — Progress Notes (Signed)
Messaged PA for patient chest pain pressure 3/10, which has been since the cath lab.  Continue to monitor patient per PA.

## 2023-04-23 NOTE — Interval H&P Note (Signed)
History and Physical Interval Note:  04/23/2023 9:59 AM  Destiny Brown  has presented today for surgery, with the diagnosis of abnormal ct.  The various methods of treatment have been discussed with the patient and family. After consideration of risks, benefits and other options for treatment, the patient has consented to  Procedure(s): LEFT HEART CATH AND CORONARY ANGIOGRAPHY (N/A) as a surgical intervention.  The patient's history has been reviewed, patient examined, no change in status, stable for surgery.  I have reviewed the patient's chart and labs.  Questions were answered to the patient's satisfaction.     Tonny Bollman

## 2023-04-23 NOTE — Discharge Summary (Signed)
Discharge Summary    Patient ID: Destiny Brown MRN: [id number]; DOB: Mar 10, 1946  Admit date: 04/23/2023 Discharge date: 04/23/2023  PCP:  Adrian Prince, MD   [facility] Providers Cardiologist:  Sherryl Manges, MD        Discharge Diagnoses    Principal Problem:   Abnormal CT scan, heart    Diagnostic Studies/Procedures    Cath 04/23/2023   Ost LAD to Prox LAD lesion is 50% stenosed.   Prox Cx to Mid Cx lesion is 80% stenosed.   1st RPL lesion is 60% stenosed.   A drug-eluting stent was successfully placed using a SYNERGY XD 2.75X28.   Post intervention, there is a 0% residual stenosis.   1.  Widely patent left main with no significant stenosis 2.  Patent LAD with 40 to 50% proximal stenosis, nonobstructive disease appropriate for medical therapy 3.  Severe mid circumflex stenosis treated successfully with a 2.75 x 28 mm Synergy DES 4.  Large, dominant RCA with mild nonobstructive plaquing and moderate focal stenosis in a large posterolateral branch 5.  Normal LVEDP   Recommendations: Resume apixaban tomorrow.  Clopidogrel 600 mg administered orally on the table during the procedure.  Take clopidogrel 75 mg daily starting tomorrow for at least 3 months, then consider transition to aspirin 81 mg daily in the setting of chronic oral anticoagulation.    Same day DC if criteria met _____________   History of Present Illness     Destiny Brown is a 77 y.o. female with PMH of HTN, HLDm DM II, LBBB, CAD and PAF. She was seen by Dr. Graciela Husbands in Feb 2024 and noted chest pain and shortness of breath. CCTA was obtained and demonstrated hemodynamically significant stenoses in the LAD and LCx by FFR. Cardiac catheterization was arranged in March, but her Hgb was lower and her SCr was higher. Her cardiac catheterization was postponed. She has been evaluated by GI with plans to proceed with colonoscopy. Dr. Graciela Husbands has recommended proceeding with cardiac catheterization prior to  proceeding with colonoscopy. Her Hgb has improved over time.  Labs from primary care received and personally reviewed. 04/01/23: Creatinine 1.5, K+ 4.9, Hgb 10.6, MCV 91.3.  Her Hgb in October 2023 was 11.  Hgb 03/08/2023 was 9.6.   She is here w her husband. She notes continued dyspnea on exertion and exercise intolerance. She feels this has progressively worsened. She has not had chest pain or rest symptoms. She has not had syncope, orthopnea, significant edema. She has never smoked cigarettes. She does not know her family hx. She is adopted.     Hospital Course     Consultants: N/A   Patient was set up for outpatient cardiac catheterization.  She presented for the planned procedure on 04/23/2023 which showed 50% ostial to proximal LAD lesion, 80% proximal to mid left circumflex lesion treated with 2.75 x 28 mm DES, 60% first RPL lesion.  Postprocedure, she had mild chest discomfort that improved with burping for the first hour.  EKG showed no significant change.  Her symptoms eventually resolved.  She was loaded on 600 mg Plavix during the procedure and started on 75 mg daily of Plavix.  The plan is to resume Eliquis tomorrow and continue combination of Eliquis and Plavix for at least 3 months then consider transition to Plavix to aspirin thereafter.   Her radial cath site appears to be stable without bleeding, drainage or hematoma.  She is deemed stable for discharge from the cardiac perspective.  Outpatient follow-up has been arranged.      Did the patient have an acute coronary syndrome (MI, NSTEMI, STEMI, etc) this admission?:  No                               Did the patient have a percutaneous coronary intervention (stent / angioplasty)?:  Yes.     Cath/PCI Registry Performance & Quality Measures: Aspirin prescribed? - Yes ADP Receptor Inhibitor (Plavix/Clopidogrel, Brilinta/Ticagrelor or Effient/Prasugrel) prescribed (includes medically managed patients)? - Yes High Intensity Statin  (Lipitor 40-80mg  or Crestor 20-40mg ) prescribed? - No - on low dose due to intolerance For EF <40%, was ACEI/ARB prescribed? - Not Applicable (EF >/= 40%) For EF <40%, Aldosterone Antagonist (Spironolactone or Eplerenone) prescribed? - Not Applicable (EF >/= 40%) Cardiac Rehab Phase II ordered? - Yes         _____________  Discharge Vitals Blood pressure (!) 150/57, pulse 74, temperature 98.3 F (36.8 C), resp. rate 19, height 5\' 5"  (1.651 m), weight 83 kg, SpO2 99 %.  Filed Weights   04/23/23 0849  Weight: 83 kg    Labs & Radiologic Studies    CBC No results for input(s): "WBC", "NEUTROABS", "HGB", "HCT", "MCV", "PLT" in the last 72 hours. Basic Metabolic Panel No results for input(s): "NA", "K", "CL", "CO2", "GLUCOSE", "BUN", "CREATININE", "CALCIUM", "MG", "PHOS" in the last 72 hours. Liver Function Tests No results for input(s): "AST", "ALT", "ALKPHOS", "BILITOT", "PROT", "ALBUMIN" in the last 72 hours. No results for input(s): "LIPASE", "AMYLASE" in the last 72 hours. High Sensitivity Troponin:   No results for input(s): "TROPONINIHS" in the last 720 hours.  BNP Invalid input(s): "POCBNP" D-Dimer No results for input(s): "DDIMER" in the last 72 hours. Hemoglobin A1C No results for input(s): "HGBA1C" in the last 72 hours. Fasting Lipid Panel No results for input(s): "CHOL", "HDL", "LDLCALC", "TRIG", "CHOLHDL", "LDLDIRECT" in the last 72 hours. Thyroid Function Tests No results for input(s): "TSH", "T4TOTAL", "T3FREE", "THYROIDAB" in the last 72 hours.  Invalid input(s): "FREET3" _____________  CARDIAC CATHETERIZATION  Result Date: 04/23/2023   Ost LAD to Prox LAD lesion is 50% stenosed.   Prox Cx to Mid Cx lesion is 80% stenosed.   1st RPL lesion is 60% stenosed.   A drug-eluting stent was successfully placed using a SYNERGY XD 2.75X28.   Post intervention, there is a 0% residual stenosis. 1.  Widely patent left main with no significant stenosis 2.  Patent LAD with 40  to 50% proximal stenosis, nonobstructive disease appropriate for medical therapy 3.  Severe mid circumflex stenosis treated successfully with a 2.75 x 28 mm Synergy DES 4.  Large, dominant RCA with mild nonobstructive plaquing and moderate focal stenosis in a large posterolateral branch 5.  Normal LVEDP Recommendations: Resume apixaban tomorrow.  Clopidogrel 600 mg administered orally on the table during the procedure.  Take clopidogrel 75 mg daily starting tomorrow for at least 3 months, then consider transition to aspirin 81 mg daily in the setting of chronic oral anticoagulation. Same day DC if criteria met   Disposition   Pt is being discharged home today in good condition.  Follow-up Plans & Appointments     Follow-up Information     Duke Salvia, MD Follow up on 05/03/2023.   Specialty: Cardiology Why: 3:30PM. Cardiology follow up Contact information: [address] [place] [address] [phone]  Discharge Instructions     Amb Referral to Cardiac Rehabilitation   Complete by: As directed    Diagnosis:  Coronary Stents PTCA     After initial evaluation and assessments completed: Virtual Based Care may be provided alone or in conjunction with Phase 2 Cardiac Rehab based on patient barriers.: Yes   Intensive Cardiac Rehabilitation (ICR) [facility] location only OR Traditional Cardiac Rehabilitation (TCR) *If criteria for ICR are not met will enroll in TCR [facility] only): Yes        Discharge Medications   Allergies as of 04/23/2023       Reactions   Oxycodone Other (See Comments)   Severe headaches, hallucinations    Codeine Other (See Comments)   Severe Headaches with codeine; but not with other opioids   Percocet [oxycodone-acetaminophen] Other (See Comments)   hallucinations        Medication List     STOP taking these medications    aspirin EC 81 MG tablet       TAKE these medications    Accu-Chek FastClix Lancets  Misc 1 each by Other route 2 (two) times daily. Use 1 lancet bid   Accu-Chek SmartView test strip Generic drug: glucose blood 1 strip by Other route 2 (two) times daily. Use 1 strip to check glucose twice a day   albuterol 108 (90 Base) MCG/ACT inhaler Commonly known as: VENTOLIN HFA Inhale 2 puffs into the lungs every 6 (six) hours as needed for wheezing or shortness of breath (Cough).   amLODipine 5 MG tablet Commonly known as: NORVASC Take 5 mg by mouth in the morning.   apixaban 5 MG Tabs tablet Commonly known as: ELIQUIS Take 1 tablet (5 mg total) by mouth 2 (two) times daily.   Clenpiq 10-3.5-12 MG-GM -GM/175ML Soln Generic drug: Sod Picosulfate-Mag Ox-Cit Acd 175 mL orally twice for 2   clopidogrel 75 MG tablet Commonly known as: Plavix Take 1 tablet (75 mg total) by mouth daily.   hydrochlorothiazide 25 MG tablet Commonly known as: HYDRODIURIL Take 25 mg by mouth in the morning.   latanoprost 0.005 % ophthalmic solution Commonly known as: XALATAN Place 1 drop into both eyes at bedtime.   levothyroxine 125 MCG tablet Commonly known as: SYNTHROID Take 125 mcg by mouth daily before breakfast.   losartan 100 MG tablet Commonly known as: COZAAR Take 0.5 tablets (50 mg total) by mouth daily.   metFORMIN 1000 MG tablet Commonly known as: GLUCOPHAGE Take 1,000 mg by mouth 2 (two) times daily.   rosuvastatin 5 MG tablet Commonly known as: CRESTOR Take 5 mg by mouth 2 (two) times a week. Tuesdays & Thursdays.   Vitamin D3 25 MCG (1000 UT) Caps Take 1,000 Units by mouth in the morning.           Outstanding Labs/Studies   N/A  Duration of Discharge Encounter   Greater than 30 minutes including physician time.  Ramond Dial, PA 04/23/2023, 3:45 PM

## 2023-04-26 ENCOUNTER — Encounter (HOSPITAL_COMMUNITY): Payer: Self-pay | Admitting: Cardiovascular Disease

## 2023-04-28 ENCOUNTER — Telehealth (HOSPITAL_COMMUNITY): Payer: Self-pay

## 2023-04-28 NOTE — Telephone Encounter (Signed)
Pt insurance is active and benefits verified through [facility] Medicare Co-pay 0, DED 0/0 met, out of pocket $3,600/$320 met, co-insurance 0%. no pre-authorization required. Passport, 04/28/2023@3 :56pm, REF# [id number]   How many CR sessions are covered? (72 visits for ICR)72 visits Is this a lifetime maximum or an annual maximum? lifetime Has the member used any of these services to date? no  Is there a time limit (weeks/months) on start of program and/or program completion? no     Will contact patient to see if she is interested in the Cardiac Rehab Program. If interested, patient will need to complete follow up appt. Once completed, patient will be contacted for scheduling upon review by the RN Navigator.

## 2023-05-03 ENCOUNTER — Ambulatory Visit: Payer: Medicare Other | Attending: Internal Medicine | Admitting: Internal Medicine

## 2023-05-03 ENCOUNTER — Other Ambulatory Visit: Payer: Self-pay | Admitting: Pulmonary Disease

## 2023-05-03 ENCOUNTER — Encounter: Payer: Self-pay | Admitting: Internal Medicine

## 2023-05-03 VITALS — BP 140/68 | HR 77 | Ht 65.0 in | Wt 189.0 lb

## 2023-05-03 DIAGNOSIS — I480 Paroxysmal atrial fibrillation: Secondary | ICD-10-CM

## 2023-05-03 DIAGNOSIS — I447 Left bundle-branch block, unspecified: Secondary | ICD-10-CM | POA: Diagnosis not present

## 2023-05-03 DIAGNOSIS — R002 Palpitations: Secondary | ICD-10-CM | POA: Diagnosis not present

## 2023-05-03 MED ORDER — APIXABAN 5 MG PO TABS: 5.0000 mg | ORAL_TABLET | Freq: Two times a day (BID) | ORAL | 3 refills | Status: DC

## 2023-05-03 MED ORDER — LOSARTAN POTASSIUM 100 MG PO TABS: 100.0000 mg | ORAL_TABLET | Freq: Every day | ORAL | 3 refills | Status: DC

## 2023-05-03 NOTE — Patient Instructions (Addendum)
Medication Instructions:  Your physician has recommended you make the following change in your medication:   ** Increase Losartan to 100mg  - 1 tablet by mouth daily     *If you need a refill on your cardiac medications before your next appointment, please call your pharmacy*   Lab Work: None ordered.  If you have labs (blood work) drawn today and your tests are completely normal, you will receive your results only by: MyChart Message (if you have MyChart) OR A paper copy in the mail If you have any lab test that is abnormal or we need to change your treatment, we will call you to review the results.   Testing/Procedures: None ordered.    Follow-Up: At [facility], you and your health needs are our priority.  As part of our continuing mission to provide you with exceptional heart care, we have created designated Provider Care Teams.  These Care Teams include your primary Cardiologist (physician) and Advanced Practice Providers (APPs -  Physician Assistants and Nurse Practitioners) who all work together to provide you with the care you need, when you need it.  We recommend signing up for the patient portal called "MyChart".  Sign up information is provided on this After Visit Summary.  MyChart is used to connect with patients for Virtual Visits (Telemedicine).  Patients are able to view lab/test results, encounter notes, upcoming appointments, etc.  Non-urgent messages can be sent to your provider as well.   To learn more about what you can do with MyChart, go to [email or web address].    Your next appointment:   6 months with Dr Odessa Fleming PA  1) [facility]  Website: [email or web address]   2) [facility]  Website: [email or web address]  Phone: [phone]  Fax: [phone]  Email: [email or web address]    We can only provide the prescription.  We are not able to fax or email this out of the country

## 2023-05-03 NOTE — Progress Notes (Signed)
Patient Care Team: Adrian Prince, MD as PCP - General (Endocrinology) Duke Salvia, MD as PCP - Cardiology (Cardiology) Ernesto Rutherford, MD as Consulting Physician (Ophthalmology)   HPI  Destiny Brown is a 77 y.o. female Seen in followup for PAF presenting with RVR 9/15   She has LBBB.  HTN DM  EF 55%;  myoview neg for ischemia . Increasing palpitations >> ZIO 3/22>> symptomatic palpitations associated with sinus rhythm frequently but also occasionally with isolated ectopics.  There were a number of episodes of asymptomatic SVT     When I saw her 2/24, she also had complaints of chest pain with a positive Levine sign. Underwent evaluation as noted below with CTA and subsequent stenting of her circumflex with residual LAD disease for which medical therapy was recommended she remains on apixaban as well as clopidogrel for 3 months  GI evaluation for anemia is pending  Following catheterization she has some shortness of breath but is able to do most of her ADLs without dyspnea, no further chest pain.  Cardiovascular fit, walking less than 5 minutes  DATE TEST EF   9/15 Echo  50- 55 %   9/15 MYOVIEW   55 % No ischemia/infarcts  2/24 CTA  CAscore 457 FFR + LAD Cx  4/24 LHV  CX 80>>stent      Date Cr K Hgb  3/18 1.19  11.4  6/21  1.07    1/24  1.07 (10/23) 4.0 (10/23) 10.2   Thromboembolic risk factors ( age -75 HTN-1, DM-1, Gender-1) for a CHADSVASc Score >5       Past Medical History:  Diagnosis Date   Arthritis    Atrial fibrillation with RVR (HCC) 09/21/2014   Bronchitis    h/o   Diabetes mellitus    NO MEDS,  DIET CONTROLLED   History of migraines    "when working under alot of stress"   Hypertension    Hypothyroid    LBBB (left bundle branch block) 09/22/2014   OSA (obstructive sleep apnea) 04/09/2015   Moderate OSA with AHI 16.7 events per hour with successful CPAP titration to 8cm H2O   PONV (postoperative nausea and vomiting)    PT DOESNOT WANT  TO TAKE HER ATENOLOL     DOS.   " IT DROPS MY BP TO LOW"    Past Surgical History:  Procedure Laterality Date   ABDOMINAL HYSTERECTOMY  03/2001   APPENDECTOMY     BACK SURGERY     CATARACT EXTRACTION, BILATERAL     WITH IMPLANTS   CERVICAL FUSION  08/2003   CHOLECYSTECTOMY  03/2010   CORONARY STENT INTERVENTION N/A 04/23/2023   Procedure: CORONARY STENT INTERVENTION;  Surgeon: Tonny Bollman, MD;  [facility] INVASIVE CV LAB;  Service: Cardiovascular;  Laterality: N/A;   EYE SURGERY     LEFT HEART CATH AND CORONARY ANGIOGRAPHY N/A 04/23/2023   Procedure: LEFT HEART CATH AND CORONARY ANGIOGRAPHY;  Surgeon: Tonny Bollman, MD;  Location[facility] INVASIVE CV LAB;  Service: Cardiovascular;  Laterality: N/A;   LUMBAR DISC SURGERY     LUMBAR LAMINECTOMY/DECOMPRESSION MICRODISCECTOMY  10/2010   POSTERIOR FUSION LUMBAR SPINE  12/16/11; 12/2005; 07/2004   L2-3; L3-4; L4-5   REVERSE SHOULDER ARTHROPLASTY Right 02/26/2022   Procedure: REVERSE SHOULDER ARTHROPLASTY;  Surgeon: Jones Broom, MD;  Location: [facility];  Service: Orthopedics;  Laterality: Right;   TOTAL KNEE ARTHROPLASTY Right 03/18/2017   Procedure: RIGHT TOTAL KNEE ARTHROPLASTY;  Surgeon: Eugenia Mcalpine, MD;  Location: [facility];  Service: Orthopedics;  Laterality: Right;    Current Outpatient Medications  Medication Sig Dispense Refill   ACCU-CHEK FASTCLIX LANCETS MISC 1 each by Other route 2 (two) times daily. Use 1 lancet bid     ACCU-CHEK SMARTVIEW test strip 1 strip by Other route 2 (two) times daily. Use 1 strip to check glucose twice a day     albuterol (VENTOLIN HFA) 108 (90 Base) MCG/ACT inhaler Inhale 2 puffs into the lungs every 6 (six) hours as needed for wheezing or shortness of breath (Cough). 18 g 2   amLODipine (NORVASC) 5 MG tablet Take 5 mg by mouth in the morning.     apixaban (ELIQUIS) 5 MG TABS tablet Take 1 tablet (5 mg total) by mouth 2 (two) times daily. 180 tablet 1   Cholecalciferol (VITAMIN D3) 1000 UNITS CAPS Take  1,000 Units by mouth in the morning.     CLENPIQ 10-3.5-12 MG-GM -GM/175ML SOLN 175 mL orally twice for 2     clopidogrel (PLAVIX) 75 MG tablet Take 1 tablet (75 mg total) by mouth daily. 30 tablet 2   hydrochlorothiazide (HYDRODIURIL) 25 MG tablet Take 25 mg by mouth in the morning.     latanoprost (XALATAN) 0.005 % ophthalmic solution Place 1 drop into both eyes at bedtime.     levothyroxine (SYNTHROID, LEVOTHROID) 125 MCG tablet Take 125 mcg by mouth daily before breakfast.     losartan (COZAAR) 100 MG tablet Take 0.5 tablets (50 mg total) by mouth daily. 45 tablet 3   metFORMIN (GLUCOPHAGE) 1000 MG tablet Take 1,000 mg by mouth 2 (two) times daily.     pantoprazole (PROTONIX) 40 MG tablet TAKE 1 TABLET BY MOUTH EVERY DAY 90 tablet 1   rosuvastatin (CRESTOR) 5 MG tablet Take 5 mg by mouth 2 (two) times a week. Tuesdays & Thursdays.     No current facility-administered medications for this visit.    Allergies  Allergen Reactions   Oxycodone Other (See Comments)    Severe headaches, hallucinations    Codeine Other (See Comments)    Severe Headaches with codeine; but not with other opioids   Percocet [Oxycodone-Acetaminophen] Other (See Comments)    hallucinations    Review of Systems negative except from HPI and PMH  Physical Exam BP (!) 140/70   Pulse 77   Ht 5\' 5"  (1.651 m)   Wt 189 lb (85.7 kg)   SpO2 95%   BMI 31.45 kg/m  Well developed and nourished in no acute distress HENT normal Neck supple with JVP-  flat    Clear Regular rate and rhythm, no murmurs or gallops Abd-soft with active BS No Clubbing cyanosis edema Skin-warm and dry A & Oriented  Grossly normal sensory and motor function  ECG  Sinus with left bundle branch block      Assessment and  Plan  Paroxysmal atrial fibrillation  Left bundle branch block  Hypertension  Palpitations--with and without ectopy   Coronary artery disease status post circumflex stenting and residual LAD disease  nonobstructive (4/24  Presyncope    Blood pressure remains inadequately controlled.  We will resume her Cozaar dosing at 100 mg daily.  Continue the amlodipine at 5.  No interval atrial fibrillation of which she is aware.  Has anticipated GI evaluation for iron deficiency anemia  Will continue clopidogrel in conjunction with Eliquis for 3 months per the recommendation following PCI and then will transition to aspirin per that recommendation with reassessment at 6 months  at which time we will probably stop the aspirin.

## 2023-05-04 ENCOUNTER — Encounter: Payer: Self-pay | Admitting: Internal Medicine

## 2023-05-06 ENCOUNTER — Telehealth (HOSPITAL_COMMUNITY): Payer: Self-pay

## 2023-05-06 NOTE — Telephone Encounter (Signed)
Called patient to see if she was interested in participating in the Cardiac Rehab Program. Patient stated yes. Patient will come in for orientation on 05/13/23 @ 8AM and will attend the 8:15AM exercise class.   Pensions consultant.

## 2023-05-10 ENCOUNTER — Other Ambulatory Visit (HOSPITAL_COMMUNITY): Payer: Self-pay

## 2023-05-11 ENCOUNTER — Telehealth (HOSPITAL_COMMUNITY): Payer: Self-pay

## 2023-05-11 NOTE — Telephone Encounter (Signed)
Reviewed with patient the Cardiac Rehab Cardiac Risk [place] Nursing Assessment. Patient knows where our office is located. Patient "very nervous" about rt knee giving out. Has fallen x 2 in past year. Called pt back and left a message requesting she bring either her cane or walker to orientation.

## 2023-05-13 ENCOUNTER — Encounter (HOSPITAL_COMMUNITY)
Admission: RE | Admit: 2023-05-13 | Discharge: 2023-05-13 | Disposition: A | Discharge status: Home / Self Care | Payer: Medicare Other | Source: Ambulatory Visit | Attending: Internal Medicine | Admitting: Internal Medicine

## 2023-05-13 VITALS — BP 122/84 | HR 78 | Ht 65.0 in | Wt 191.1 lb

## 2023-05-13 DIAGNOSIS — Z955 Presence of coronary angioplasty implant and graft: Secondary | ICD-10-CM

## 2023-05-13 NOTE — Progress Notes (Signed)
Cardiac Rehab Medication Review by a Nurse  Does the patient  feel that his/her medications are working for him/her?  no  Has the patient been experiencing any side effects to the medications prescribed?  no  Does the patient measure his/her own blood pressure or blood glucose at home?  yes   Does the patient have any problems obtaining medications due to transportation or finances?   yes  Understanding of regimen: excellent Understanding of indications: excellent Potential of compliance: excellent    Nurse comments: Camyrn is taking her medications as prescribed and has a good understanding of what her medications are for. Xiara says she gets her eliquis from [place] by mail order otherwise she would have to pay $600 a month. Markaylah checks her CBG's daily and checks her blood pressures intermittently.    Arta Bruce Jamell Laymon RN 05/13/2023 7:57 AM

## 2023-05-13 NOTE — Progress Notes (Signed)
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 Use 1 lancet bid, Disp: , Rfl:    ACCU-CHEK SMARTVIEW test strip, 1 strip by Other route 2 (two) times daily. Use 1 strip to check glucose twice a day, Disp: , Rfl:    albuterol (VENTOLIN HFA) 108 (90 Base) MCG/ACT inhaler, Inhale 2 puffs into the lungs every 6 (six) hours as needed for wheezing or shortness of breath (Cough)., Disp: 18 g, Rfl: 2   amLODipine (NORVASC) 5 MG tablet, Take 5 mg by mouth in the morning., Disp: , Rfl:    apixaban (ELIQUIS) 5 MG TABS tablet, Take 1 tablet (5 mg total) by mouth 2 (two) times daily., Disp: 180 tablet, Rfl: 3   Cholecalciferol (VITAMIN D3) 1000 UNITS CAPS, Take 1,000 Units by mouth in the morning., Disp: , Rfl:    clopidogrel (PLAVIX) 75 MG tablet, Take 1 tablet (75 mg total) by mouth daily., Disp: 30 tablet, Rfl: 2   hydrochlorothiazide (HYDRODIURIL) 25 MG tablet, Take 25 mg by mouth in the morning., Disp: , Rfl:    latanoprost (XALATAN) 0.005 % ophthalmic solution, Place 1 drop into both eyes at bedtime., Disp: , Rfl:    levothyroxine (SYNTHROID, LEVOTHROID) 125 MCG tablet, Take 125 mcg by mouth daily before breakfast., Disp: , Rfl:    losartan (COZAAR) 100 MG tablet, Take 1 tablet (100 mg total) by mouth daily., Disp: 90  tablet, Rfl: 3   metFORMIN (GLUCOPHAGE) 1000 MG tablet, Take 1,000 mg by mouth 2 (two) times daily., Disp: , Rfl:    pantoprazole (PROTONIX) 40 MG tablet, TAKE 1 TABLET BY MOUTH EVERY DAY, Disp: 90 tablet, Rfl: 1   rosuvastatin (CRESTOR) 5 MG tablet, Take 5 mg by mouth 2 (two) times a week. Tuesdays & Thursdays., Disp: , Rfl:    CLENPIQ 10-3.5-12 MG-GM -GM/175ML SOLN, 175 mL orally twice for 2, Disp: , Rfl:   Past Medical History: Past Medical History:  Diagnosis Date   Arthritis    Atrial fibrillation with RVR (HCC) 09/21/2014   Bronchitis    h/o   Diabetes mellitus    NO MEDS,  DIET CONTROLLED   History of migraines    "when working under alot of stress"   Hypertension    Hypothyroid    LBBB (left bundle branch block) 09/22/2014   OSA (obstructive sleep apnea) 04/09/2015   Moderate OSA with AHI 16.7 events per hour with successful CPAP titration to 8cm H2O   PONV (postoperative nausea and vomiting)    PT DOESNOT WANT TO TAKE HER ATENOLOL     DOS.   " IT DROPS MY BP TO LOW"    Tobacco Use: Social History   Tobacco Use  Smoking Status Never  Smokeless Tobacco Never    Labs: Review Flowsheet       Latest Ref Rng &  Units 04/01/2010 04/02/2010 09/21/2014 09/22/2014  Labs for ITP Cardiac and Pulmonary Rehab  Cholestrol 0 - 200 mg/dL - 161        ATP III CLASSIFICATION:  <200     mg/dL   Desirable  096-045  mg/dL   Borderline High  >=409    mg/dL   High         - 811   LDL (calc) 0 - 99 mg/dL - 87        Total Cholesterol/HDL:CHD Risk Coronary Heart Disease Risk Table                     Men   Women  1/2 Average Risk   3.4   3.3  Average Risk       5.0   4.4  2 X Average Risk   9.6   7.1  3 X Average Risk  23.4   11.0        Use the calculated Patient Ratio above and the CHD Risk Table to determine the patient's CHD Risk.        ATP III CLASSIFICATION (LDL):  <100     mg/dL   Optimal  914-782  mg/dL   Near or Above                    Optimal  130-159  mg/dL    Borderline  956-213  mg/dL   High  >086     mg/dL   Very High  - 91   HDL-C >39 mg/dL - 36  - 57   Trlycerides <150 mg/dL - 99  - 578   Hemoglobin A1c <5.7 % 9.1 (NOTE) The ADA recommends the following therapeutic goal for glycemic control related to Hgb A1c measurement: Goal of therapy: <6.5 Hgb A1c  Reference: American Diabetes Association: Clinical Practice Recommendations 2010, Diabetes Care, 2010, 33: (Suppl  1).  - 8.1  -  TCO2 0 - 100 mmol/L 26  - - -    Capillary Blood Glucose: Lab Results  Component Value Date   GLUCAP 147 (H) 04/23/2023   GLUCAP 179 (H) 02/26/2022   GLUCAP 125 (H) 02/26/2022   GLUCAP 116 (H) 02/17/2022   GLUCAP 163 (H) 03/20/2017     Exercise Target Goals: Exercise Program Goal: Individual exercise prescription set using results from initial 6 min walk test and THRR while considering  patient's activity barriers and safety.   Exercise Prescription Goal: Initial exercise prescription builds to 30-45 minutes a day of aerobic activity, 2-3 days per week.  Home exercise guidelines will be given to patient during program as part of exercise prescription that the participant will acknowledge.  Activity Barriers & Risk Stratification:  Activity Barriers & Cardiac Risk Stratification - 05/13/23 1001       Activity Barriers & Cardiac Risk Stratification   Activity Barriers Deconditioning;Shortness of Breath;Balance Concerns;History of Falls;Right Knee Replacement;Arthritis;Back Problems;Assistive Device    Cardiac Risk Stratification High   <5 METs on            6 Minute Walk:  6 Minute Walk     Row Name 05/13/23 0800         6 Minute Walk   Phase Initial     Distance 874 feet  Used Cane     Walk Time 6 minutes     # of Rest Breaks 1  3:00-4:10, stopped due to HR 130. resumed once within target     MPH  1.7     METS 2.1     RPE 13     Perceived Dyspnea  1.5     VO2 Peak 7.23     Symptoms Yes (comment)     Comments SOB, RPD = 1.5.  Resolved with rest. No pain     Resting HR 78 bpm     Resting BP 122/84     Resting Oxygen Saturation  98 %     Exercise Oxygen Saturation  during 6 min walk 100 %     Max Ex. HR 137 bpm     Max Ex. BP 156/76     2 Minute Post BP 128/74              Oxygen Initial Assessment:   Oxygen Re-Evaluation:   Oxygen Discharge (Final Oxygen Re-Evaluation):   Initial Exercise Prescription:  Initial Exercise Prescription - 05/13/23 1000       Date of Initial Exercise RX and Referring Provider   Date 05/13/23    Referring Provider Sherryl Manges, MD    Expected Discharge Date 07/23/23      NuStep   Level 1    SPM 85    Minutes 15    METs 2      Prescription Details   Frequency (times per week) 3    Duration Progress to 30 minutes of continuous aerobic without signs/symptoms of physical distress      Intensity   THRR 40-80% of Max Heartrate 57-114    Ratings of Perceived Exertion 11-13    Perceived Dyspnea 0-4      Progression   Progression Continue progressive overload as per policy without signs/symptoms or physical distress.      Resistance Training   Training Prescription Yes    Weight 2    Reps 10-15             Perform Capillary Blood Glucose checks as needed.  Exercise Prescription Changes:   Exercise Comments:   Exercise Goals and Review:   Exercise Goals     Row Name 05/13/23 0754             Exercise Goals   Increase Physical Activity Yes       Intervention Provide advice, education, support and counseling about physical activity/exercise needs.;Develop an individualized exercise prescription for aerobic and resistive training based on initial evaluation findings, risk stratification, comorbidities and participant's personal goals.       Expected Outcomes Short Term: Attend rehab on a regular basis to increase amount of physical activity.;Long Term: Exercising regularly at least 3-5 days a week.;Long Term: Add in home exercise to make  exercise part of routine and to increase amount of physical activity.       Increase Strength and Stamina Yes       Intervention Provide advice, education, support and counseling about physical activity/exercise needs.;Develop an individualized exercise prescription for aerobic and resistive training based on initial evaluation findings, risk stratification, comorbidities and participant's personal goals.       Expected Outcomes Short Term: Increase workloads from initial exercise prescription for resistance, speed, and METs.;Short Term: Perform resistance training exercises routinely during rehab and add in resistance training at home;Long Term: Improve cardiorespiratory fitness, muscular endurance and strength as measured by increased METs and functional capacity ( )       Able to understand and use rate of perceived exertion (RPE) scale Yes       Intervention Provide education and explanation on how to use  RPE scale       Expected Outcomes Short Term: Able to use RPE daily in rehab to express subjective intensity level;Long Term:  Able to use RPE to guide intensity level when exercising independently       Able to understand and use Dyspnea scale Yes       Intervention Provide education and explanation on how to use Dyspnea scale       Expected Outcomes Short Term: Able to use Dyspnea scale daily in rehab to express subjective sense of shortness of breath during exertion;Long Term: Able to use Dyspnea scale to guide intensity level when exercising independently       Knowledge and understanding of Target Heart Rate Range (THRR) Yes       Intervention Provide education and explanation of THRR including how the numbers were predicted and where they are located for reference       Expected Outcomes Short Term: Able to state/look up THRR;Short Term: Able to use daily as guideline for intensity in rehab;Long Term: Able to use THRR to govern intensity when exercising independently       Understanding of  Exercise Prescription Yes       Intervention Provide education, explanation, and written materials on patient's individual exercise prescription       Expected Outcomes Short Term: Able to explain program exercise prescription;Long Term: Able to explain home exercise prescription to exercise independently                Exercise Goals Re-Evaluation :   Discharge Exercise Prescription (Final Exercise Prescription Changes):   Nutrition:  Target Goals: Understanding of nutrition guidelines, daily intake of sodium 1500mg , cholesterol 200mg , calories 30% from fat and 7% or less from saturated fats, daily to have 5 or more servings of fruits and vegetables.  Biometrics:  Pre Biometrics - 05/13/23 0753       Pre Biometrics   Waist Circumference 46.25 inches    Hip Circumference 50.5 inches    Waist to Hip Ratio 0.92 %    Triceps Skinfold 35 mm    % Body Fat 47 %    Grip Strength 16 kg    Flexibility 10.5 in    Single Leg Stand 3.5 seconds              Nutrition Therapy Plan and Nutrition Goals:   Nutrition Assessments:  MEDIFICTS Score Key: ?70 Need to make dietary changes  40-70 Heart Healthy Diet ? 40 Therapeutic Level Cholesterol Diet    Picture Your Plate Scores: <09 Unhealthy dietary pattern with much room for improvement. 41-50 Dietary pattern unlikely to meet recommendations for good health and room for improvement. 51-60 More healthful dietary pattern, with some room for improvement.  >60 Healthy dietary pattern, although there may be some specific behaviors that could be improved.    Nutrition Goals Re-Evaluation:   Nutrition Goals Re-Evaluation:   Nutrition Goals Discharge (Final Nutrition Goals Re-Evaluation):   Psychosocial: Target Goals: Acknowledge presence or absence of significant depression and/or stress, maximize coping skills, provide positive support system. Participant is able to verbalize types and ability to use techniques and  skills needed for reducing stress and depression.  Initial Review & Psychosocial Screening:  Initial Psych Review & Screening - 05/13/23 0844       Initial Review   Current issues with Current Stress Concerns    Source of Stress Concerns Family    Comments Byrd and her husband help take care of her tow granchildren  as her daughter has bipolar disorder. Gaytha's granson is Autistic he is able to to live independantly with supervision      Family Dynamics   Good Support System? Yes   Anisten has her husband and son who lives in [place] for support     Barriers   Psychosocial barriers to participate in program The patient should benefit from training in stress management and relaxation.      Screening Interventions   Interventions To provide support and resources with identified psychosocial needs    Expected Outcomes Long Term Goal: Stressors or current issues are controlled or eliminated.             Quality of Life Scores:  Quality of Life - 05/13/23 0938       Quality of Life   Select Quality of Life      Quality of Life Scores   Health/Function Pre 22.7 %    Socioeconomic Pre 25.07 %    Psych/Spiritual Pre 24.86 %    Family Pre 22.5 %    GLOBAL Pre 23.6 %            Scores of 19 and below usually indicate a poorer quality of life in these areas.  A difference of  2-3 points is a clinically meaningful difference.  A difference of 2-3 points in the total score of the Quality of Life Index has been associated with significant improvement in overall quality of life, self-image, physical symptoms, and general health in studies assessing change in quality of life.  PHQ-9: Review Flowsheet       05/13/2023  Depression screen PHQ 2/9  Decreased Interest 0  Down, Depressed, Hopeless 0  PHQ - 2 Score 0  Altered sleeping 0  Tired, decreased energy 0  Change in appetite 0  Feeling bad or failure about yourself  0  Trouble concentrating 0  Moving slowly or  fidgety/restless 0  Suicidal thoughts 0  PHQ-9 Score 0   Interpretation of Total Score  Total Score Depression Severity:  1-4 = Minimal depression, 5-9 = Mild depression, 10-14 = Moderate depression, 15-19 = Moderately severe depression, 20-27 = Severe depression   Psychosocial Evaluation and Intervention:   Psychosocial Re-Evaluation:   Psychosocial Discharge (Final Psychosocial Re-Evaluation):   Vocational Rehabilitation: Provide vocational rehab assistance to qualifying candidates.   Vocational Rehab Evaluation & Intervention:  Vocational Rehab - 05/13/23 0847       Initial Vocational Rehab Evaluation & Intervention   Assessment shows need for Vocational Rehabilitation No   Kevia works full time and does not need vocational rehab at this time            Education: Education Goals: Education classes will be provided on a weekly basis, covering required topics. Participant will state understanding/return demonstration of topics presented.     Core Videos: Exercise    Move It!  Clinical staff conducted group or individual video education with verbal and written material and guidebook.  Patient learns the recommended Pritikin exercise program. Exercise with the goal of living a long, healthy life. Some of the health benefits of exercise include controlled diabetes, healthier blood pressure levels, improved cholesterol levels, improved heart and lung capacity, improved sleep, and better body composition. Everyone should speak with their doctor before starting or changing an exercise routine.  Biomechanical Limitations Clinical staff conducted group or individual video education with verbal and written material and guidebook.  Patient learns how biomechanical limitations can impact exercise and how we can  mitigate and possibly overcome limitations to have an impactful and balanced exercise routine.  Body Composition Clinical staff conducted group or individual video  education with verbal and written material and guidebook.  Patient learns that body composition (ratio of muscle mass to fat mass) is a key component to assessing overall fitness, rather than body weight alone. Increased fat mass, especially visceral belly fat, can put [place] at increased risk for metabolic syndrome, type 2 diabetes, heart disease, and even death. It is recommended to combine diet and exercise (cardiovascular and resistance training) to improve your body composition. Seek guidance from your physician and exercise physiologist before implementing an exercise routine.  Exercise Action Plan Clinical staff conducted group or individual video education with verbal and written material and guidebook.  Patient learns the recommended strategies to achieve and enjoy long-term exercise adherence, including variety, self-motivation, self-efficacy, and positive decision making. Benefits of exercise include fitness, good health, weight management, more energy, better sleep, less stress, and overall well-being.  Medical   Heart Disease Risk Reduction Clinical staff conducted group or individual video education with verbal and written material and guidebook.  Patient learns our heart is our most vital organ as it circulates oxygen, nutrients, white blood cells, and hormones throughout the entire body, and carries waste away. Data supports a plant-based eating plan like the Pritikin Program for its effectiveness in slowing progression of and reversing heart disease. The video provides a number of recommendations to address heart disease.   Metabolic Syndrome and Belly Fat  Clinical staff conducted group or individual video education with verbal and written material and guidebook.  Patient learns what metabolic syndrome is, how it leads to heart disease, and how one can reverse it and keep it from coming back. You have metabolic syndrome if you have 3 of the following 5 criteria: abdominal obesity, high  blood pressure, high triglycerides, low HDL cholesterol, and high blood sugar.  Hypertension and Heart Disease Clinical staff conducted group or individual video education with verbal and written material and guidebook.  Patient learns that high blood pressure, or hypertension, is very common in the [place]. Hypertension is largely due to excessive salt intake, but other important risk factors include being overweight, physical inactivity, drinking too much alcohol, smoking, and not eating enough potassium from fruits and vegetables. High blood pressure is a leading risk factor for heart attack, stroke, congestive heart failure, dementia, kidney failure, and premature death. Long-term effects of excessive salt intake include stiffening of the arteries and thickening of heart muscle and organ damage. Recommendations include ways to reduce hypertension and the risk of heart disease.  Diseases of Our Time - Focusing on Diabetes Clinical staff conducted group or individual video education with verbal and written material and guidebook.  Patient learns why the best way to stop diseases of our time is prevention, through food and other lifestyle changes. Medicine (such as prescription pills and surgeries) is often only a Band-Aid on the problem, not a long-term solution. Most common diseases of our time include obesity, type 2 diabetes, hypertension, heart disease, and cancer. The Pritikin Program is recommended and has been proven to help reduce, reverse, and/or prevent the damaging effects of metabolic syndrome.  Nutrition   Overview of the Pritikin Eating Plan  Clinical staff conducted group or individual video education with verbal and written material and guidebook.  Patient learns about the Pritikin Eating Plan for disease risk reduction. The Pritikin Eating Plan emphasizes a wide variety of unrefined, minimally-processed carbohydrates,  like fruits, vegetables, whole grains, and legumes. Go,  Caution, and Stop food choices are explained. Plant-based and lean animal proteins are emphasized. Rationale provided for low sodium intake for blood pressure control, low added sugars for blood sugar stabilization, and low added fats and oils for coronary artery disease risk reduction and weight management.  Calorie Density  Clinical staff conducted group or individual video education with verbal and written material and guidebook.  Patient learns about calorie density and how it impacts the Pritikin Eating Plan. Knowing the characteristics of the food you choose will help you decide whether those foods will lead to weight gain or weight loss, and whether you want to consume more or less of them. Weight loss is usually a side effect of the Pritikin Eating Plan because of its focus on low calorie-dense foods.  Label Reading  Clinical staff conducted group or individual video education with verbal and written material and guidebook.  Patient learns about the Pritikin recommended label reading guidelines and corresponding recommendations regarding calorie density, added sugars, sodium content, and whole grains.  Dining Out - Part 1  Clinical staff conducted group or individual video education with verbal and written material and guidebook.  Patient learns that restaurant meals can be sabotaging because they can be so high in calories, fat, sodium, and/or sugar. Patient learns recommended strategies on how to positively address this and avoid unhealthy pitfalls.  Facts on Fats  Clinical staff conducted group or individual video education with verbal and written material and guidebook.  Patient learns that lifestyle modifications can be just as effective, if not more so, as many medications for lowering your risk of heart disease. A Pritikin lifestyle can help to reduce your risk of inflammation and atherosclerosis (cholesterol build-up, or plaque, in the artery walls). Lifestyle interventions such as  dietary choices and physical activity address the cause of atherosclerosis. A review of the types of fats and their impact on blood cholesterol levels, along with dietary recommendations to reduce fat intake is also included.  Nutrition Action Plan  Clinical staff conducted group or individual video education with verbal and written material and guidebook.  Patient learns how to incorporate Pritikin recommendations into their lifestyle. Recommendations include planning and keeping personal health goals in mind as an important part of their success.  Healthy Mind-Set    Healthy Minds, Bodies, Hearts  Clinical staff conducted group or individual video education with verbal and written material and guidebook.  Patient learns how to identify when they are stressed. Video will discuss the impact of that stress, as well as the many benefits of stress management. Patient will also be introduced to stress management techniques. The way we think, act, and feel has an impact on our hearts.  How Our Thoughts Can Heal Our Hearts  Clinical staff conducted group or individual video education with verbal and written material and guidebook.  Patient learns that negative thoughts can cause depression and anxiety. This can result in negative lifestyle behavior and serious health problems. Cognitive behavioral therapy is an effective method to help control our thoughts in order to change and improve our emotional outlook.  Additional Videos:  Exercise    Improving Performance  Clinical staff conducted group or individual video education with verbal and written material and guidebook.  Patient learns to use a non-linear approach by alternating intensity levels and lengths of time spent exercising to help burn more calories and lose more body fat. Cardiovascular exercise helps improve heart health, metabolism, hormonal balance,  blood sugar control, and recovery from fatigue. Resistance training improves strength,  endurance, balance, coordination, reaction time, metabolism, and muscle mass. Flexibility exercise improves circulation, posture, and balance. Seek guidance from your physician and exercise physiologist before implementing an exercise routine and learn your capabilities and proper form for all exercise.  Introduction to Yoga  Clinical staff conducted group or individual video education with verbal and written material and guidebook.  Patient learns about yoga, a discipline of the coming together of mind, breath, and body. The benefits of yoga include improved flexibility, improved range of motion, better posture and core strength, increased lung function, weight loss, and positive self-image. Yoga's heart health benefits include lowered blood pressure, healthier heart rate, decreased cholesterol and triglyceride levels, improved immune function, and reduced stress. Seek guidance from your physician and exercise physiologist before implementing an exercise routine and learn your capabilities and proper form for all exercise.  Medical   Aging: Enhancing Your Quality of Life  Clinical staff conducted group or individual video education with verbal and written material and guidebook.  Patient learns key strategies and recommendations to stay in good physical health and enhance quality of life, such as prevention strategies, having an advocate, securing a Health Care Proxy and Power of Attorney, and keeping a list of medications and system for tracking them. It also discusses how to avoid risk for bone loss.  Biology of Weight Control  Clinical staff conducted group or individual video education with verbal and written material and guidebook.  Patient learns that weight gain occurs because we consume more calories than we burn (eating more, moving less). Even if your body weight is normal, you may have higher ratios of fat compared to muscle mass. Too much body fat puts you at increased risk for  cardiovascular disease, heart attack, stroke, type 2 diabetes, and obesity-related cancers. In addition to exercise, following the Pritikin Eating Plan can help reduce your risk.  Decoding Lab Results  Clinical staff conducted group or individual video education with verbal and written material and guidebook.  Patient learns that lab test reflects one measurement whose values change over time and are influenced by many factors, including medication, stress, sleep, exercise, food, hydration, pre-existing medical conditions, and more. It is recommended to use the knowledge from this video to become more involved with your lab results and evaluate your numbers to speak with your doctor.   Diseases of Our Time - Overview  Clinical staff conducted group or individual video education with verbal and written material and guidebook.  Patient learns that according to the CDC, 50% to 70% of chronic diseases (such as obesity, type 2 diabetes, elevated lipids, hypertension, and heart disease) are avoidable through lifestyle improvements including healthier food choices, listening to satiety cues, and increased physical activity.  Sleep Disorders Clinical staff conducted group or individual video education with verbal and written material and guidebook.  Patient learns how good quality and duration of sleep are important to overall health and well-being. Patient also learns about sleep disorders and how they impact health along with recommendations to address them, including discussing with a physician.  Nutrition  Dining Out - Part 2 Clinical staff conducted group or individual video education with verbal and written material and guidebook.  Patient learns how to plan ahead and communicate in order to maximize their dining experience in a healthy and nutritious manner. Included are recommended food choices based on the type of restaurant the patient is visiting.   Fueling a Forensic psychologist  Clinical staff  conducted group or individual video education with verbal and written material and guidebook.  There is a strong connection between our food choices and our health. Diseases like obesity and type 2 diabetes are very prevalent and are in large-part due to lifestyle choices. The Pritikin Eating Plan provides plenty of food and hunger-curbing satisfaction. It is easy to follow, affordable, and helps reduce health risks.  Menu Workshop  Clinical staff conducted group or individual video education with verbal and written material and guidebook.  Patient learns that restaurant meals can sabotage health goals because they are often packed with calories, fat, sodium, and sugar. Recommendations include strategies to plan ahead and to communicate with the manager, chef, or server to help order a healthier meal.  Planning Your Eating Strategy  Clinical staff conducted group or individual video education with verbal and written material and guidebook.  Patient learns about the Pritikin Eating Plan and its benefit of reducing the risk of disease. The Pritikin Eating Plan does not focus on calories. Instead, it emphasizes high-quality, nutrient-rich foods. By knowing the characteristics of the foods, we choose, we can determine their calorie density and make informed decisions.  Targeting Your Nutrition Priorities  Clinical staff conducted group or individual video education with verbal and written material and guidebook.  Patient learns that lifestyle habits have a tremendous impact on disease risk and progression. This video provides eating and physical activity recommendations based on your personal health goals, such as reducing LDL cholesterol, losing weight, preventing or controlling type 2 diabetes, and reducing high blood pressure.  Vitamins and Minerals  Clinical staff conducted group or individual video education with verbal and written material and guidebook.  Patient learns different ways to obtain  key vitamins and minerals, including through a recommended healthy diet. It is important to discuss all supplements you take with your doctor.   Healthy Mind-Set    Smoking Cessation  Clinical staff conducted group or individual video education with verbal and written material and guidebook.  Patient learns that cigarette smoking and tobacco addiction pose a serious health risk which affects millions of people. Stopping smoking will significantly reduce the risk of heart disease, lung disease, and many forms of cancer. Recommended strategies for quitting are covered, including working with your doctor to develop a successful plan.  Culinary   Becoming a Set designer conducted group or individual video education with verbal and written material and guidebook.  Patient learns that cooking at home can be healthy, cost-effective, quick, and puts them in control. Keys to cooking healthy recipes will include looking at your recipe, assessing your equipment needs, planning ahead, making it simple, choosing cost-effective seasonal ingredients, and limiting the use of added fats, salts, and sugars.  Cooking - Breakfast and Snacks  Clinical staff conducted group or individual video education with verbal and written material and guidebook.  Patient learns how important breakfast is to satiety and nutrition through the entire day. Recommendations include key foods to eat during breakfast to help stabilize blood sugar levels and to prevent overeating at meals later in the day. Planning ahead is also a key component.  Cooking - Educational psychologist conducted group or individual video education with verbal and written material and guidebook.  Patient learns eating strategies to improve overall health, including an approach to cook more at home. Recommendations include thinking of animal protein as a side on your plate rather than center stage and focusing instead on lower  calorie  dense options like vegetables, fruits, whole grains, and plant-based proteins, such as beans. Making sauces in large quantities to freeze for later and leaving the skin on your vegetables are also recommended to maximize your experience.  Cooking - Healthy Salads and Dressing Clinical staff conducted group or individual video education with verbal and written material and guidebook.  Patient learns that vegetables, fruits, whole grains, and legumes are the foundations of the Pritikin Eating Plan. Recommendations include how to incorporate each of these in flavorful and healthy salads, and how to create homemade salad dressings. Proper handling of ingredients is also covered. Cooking - Soups and [employer] - Soups and Desserts Clinical staff conducted group or individual video education with verbal and written material and guidebook.  Patient learns that Pritikin soups and desserts make for easy, nutritious, and delicious snacks and meal components that are low in sodium, fat, sugar, and calorie density, while high in vitamins, minerals, and filling fiber. Recommendations include simple and healthy ideas for soups and desserts.   Overview     The Pritikin Solution Program Overview Clinical staff conducted group or individual video education with verbal and written material and guidebook.  Patient learns that the results of the Pritikin Program have been documented in more than 100 articles published in peer-reviewed journals, and the benefits include reducing risk factors for (and, in some cases, even reversing) high cholesterol, high blood pressure, type 2 diabetes, obesity, and more! An overview of the three key pillars of the Pritikin Program will be covered: eating well, doing regular exercise, and having a healthy mind-set.  WORKSHOPS  Exercise: Exercise Basics: Building Your Action Plan Clinical staff led group instruction and group discussion with PowerPoint presentation and patient  guidebook. To enhance the learning environment the use of posters, models and videos may be added. At the conclusion of this workshop, patients will comprehend the difference between physical activity and exercise, as well as the benefits of incorporating both, into their routine. Patients will understand the FITT (Frequency, Intensity, Time, and Type) principle and how to use it to build an exercise action plan. In addition, safety concerns and other considerations for exercise and cardiac rehab will be addressed by the presenter. The purpose of this lesson is to promote a comprehensive and effective weekly exercise routine in order to improve patients' overall level of fitness.   Managing Heart Disease: Your Path to a Healthier Heart Clinical staff led group instruction and group discussion with PowerPoint presentation and patient guidebook. To enhance the learning environment the use of posters, models and videos may be added.At the conclusion of this workshop, patients will understand the anatomy and physiology of the heart. Additionally, they will understand how Pritikin's three pillars impact the risk factors, the progression, and the management of heart disease.  The purpose of this lesson is to provide a high-level overview of the heart, heart disease, and how the Pritikin lifestyle positively impacts risk factors.  Exercise Biomechanics Clinical staff led group instruction and group discussion with PowerPoint presentation and patient guidebook. To enhance the learning environment the use of posters, models and videos may be added. Patients will learn how the structural parts of their bodies function and how these functions impact their daily activities, movement, and exercise. Patients will learn how to promote a neutral spine, learn how to manage pain, and identify ways to improve their physical movement in order to promote healthy living. The purpose of this lesson is to expose patients  to  common physical limitations that impact physical activity. Participants will learn practical ways to adapt and manage aches and pains, and to minimize their effect on regular exercise. Patients will learn how to maintain good posture while sitting, walking, and lifting.  Balance Training and Fall Prevention  Clinical staff led group instruction and group discussion with PowerPoint presentation and patient guidebook. To enhance the learning environment the use of posters, models and videos may be added. At the conclusion of this workshop, patients will understand the importance of their sensorimotor skills (vision, proprioception, and the vestibular system) in maintaining their ability to balance as they age. Patients will apply a variety of balancing exercises that are appropriate for their current level of function. Patients will understand the common causes for poor balance, possible solutions to these problems, and ways to modify their physical environment in order to minimize their fall risk. The purpose of this lesson is to teach patients about the importance of maintaining balance as they age and ways to minimize their risk of falling.  WORKSHOPS   Nutrition:  Fueling a Ship broker led group instruction and group discussion with PowerPoint presentation and patient guidebook. To enhance the learning environment the use of posters, models and videos may be added. Patients will review the foundational principles of the Pritikin Eating Plan and understand what constitutes a serving size in each of the food groups. Patients will also learn Pritikin-friendly foods that are better choices when away from home and review make-ahead meal and snack options. Calorie density will be reviewed and applied to three nutrition priorities: weight maintenance, weight loss, and weight gain. The purpose of this lesson is to reinforce (in a group setting) the key concepts around what patients are  recommended to eat and how to apply these guidelines when away from home by planning and selecting Pritikin-friendly options. Patients will understand how calorie density may be adjusted for different weight management goals.  Mindful Eating  Clinical staff led group instruction and group discussion with PowerPoint presentation and patient guidebook. To enhance the learning environment the use of posters, models and videos may be added. Patients will briefly review the concepts of the Pritikin Eating Plan and the importance of low-calorie dense foods. The concept of mindful eating will be introduced as well as the importance of paying attention to internal hunger signals. Triggers for non-hunger eating and techniques for dealing with triggers will be explored. The purpose of this lesson is to provide patients with the opportunity to review the basic principles of the Pritikin Eating Plan, discuss the value of eating mindfully and how to measure internal cues of hunger and fullness using the Hunger Scale. Patients will also discuss reasons for non-hunger eating and learn strategies to use for controlling emotional eating.  Targeting Your Nutrition Priorities Clinical staff led group instruction and group discussion with PowerPoint presentation and patient guidebook. To enhance the learning environment the use of posters, models and videos may be added. Patients will learn how to determine their genetic susceptibility to disease by reviewing their family history. Patients will gain insight into the importance of diet as part of an overall healthy lifestyle in mitigating the impact of genetics and other environmental insults. The purpose of this lesson is to provide patients with the opportunity to assess their personal nutrition priorities by looking at their family history, their own health history and current risk factors. Patients will also be able to discuss ways of prioritizing and modifying the Pritikin  Eating Plan  for their highest risk areas  Menu  Clinical staff led group instruction and group discussion with PowerPoint presentation and patient guidebook. To enhance the learning environment the use of posters, models and videos may be added. Using menus brought in from [employer], or printed from [employer], patients will apply the Pritikin dining out guidelines that were presented in the [employer] video. Patients will also be able to practice these guidelines in a variety of provided scenarios. The purpose of this lesson is to provide patients with the opportunity to practice hands-on learning of the Pritikin Dining Out guidelines with actual menus and practice scenarios.  Label Reading Clinical staff led group instruction and group discussion with PowerPoint presentation and patient guidebook. To enhance the learning environment the use of posters, models and videos may be added. Patients will review and discuss the Pritikin label reading guidelines presented in Pritikin's Label Reading Educational series video. Using fool labels brought in from local grocery stores and markets, patients will apply the label reading guidelines and determine if the packaged food meet the Pritikin guidelines. The purpose of this lesson is to provide patients with the opportunity to review, discuss, and practice hands-on learning of the Pritikin Label Reading guidelines with actual packaged food labels. Cooking School  Pritikin's [employer] are designed to teach patients ways to prepare quick, simple, and affordable recipes at home. The importance of nutrition's role in chronic disease risk reduction is reflected in its emphasis in the overall Pritikin program. By learning how to prepare essential core Pritikin Eating Plan recipes, patients will increase control over what they eat; be able to customize the flavor of foods without the use of added salt, sugar, or fat; and  improve the quality of the food they consume. By learning a set of core recipes which are easily assembled, quickly prepared, and affordable, patients are more likely to prepare more healthy foods at home. These workshops focus on convenient breakfasts, simple entres, side dishes, and desserts which can be prepared with minimal effort and are consistent with nutrition recommendations for cardiovascular risk reduction. Cooking [employer] are taught by a Armed forces logistics/support/administrative officer (RD) who has been trained by the [employer]. The chef or RD has a clear understanding of the importance of minimizing - if not completely eliminating - added fat, sugar, and sodium in recipes. Throughout the series of Cooking School Workshop sessions, patients will learn about healthy ingredients and efficient methods of cooking to build confidence in their capability to prepare    Cooking School weekly topics:  Adding Flavor- Sodium-Free  Fast and Healthy Breakfasts  Powerhouse Plant-Based Proteins  Satisfying Salads and Dressings  Simple Sides and Sauces  International Cuisine-Spotlight on the [employer] Zones  Delicious Desserts  Savory Soups  [employer] - Meals in a Astronomer Appetizers and Snacks  Comforting Weekend Breakfasts  One-Pot Wonders   Fast Evening Meals  Landscape architect Your Pritikin Plate  WORKSHOPS   Healthy Mindset (Psychosocial):  Focused Goals, Sustainable Changes Clinical staff led group instruction and group discussion with PowerPoint presentation and patient guidebook. To enhance the learning environment the use of posters, models and videos may be added. Patients will be able to apply effective goal setting strategies to establish at least one personal goal, and then take consistent, meaningful action toward that goal. They will learn to identify common barriers to achieving personal goals and develop strategies to overcome them. Patients will  also  gain an understanding of how our mind-set can impact our ability to achieve goals and the importance of cultivating a positive and growth-oriented mind-set. The purpose of this lesson is to provide patients with a deeper understanding of how to set and achieve personal goals, as well as the tools and strategies needed to overcome common obstacles which may arise along the way.  From Head to Heart: The Power of a Healthy Outlook  Clinical staff led group instruction and group discussion with PowerPoint presentation and patient guidebook. To enhance the learning environment the use of posters, models and videos may be added. Patients will be able to recognize and describe the impact of emotions and mood on physical health. They will discover the importance of self-care and explore self-care practices which may work for them. Patients will also learn how to utilize the 4 C's to cultivate a healthier outlook and better manage stress and challenges. The purpose of this lesson is to demonstrate to patients how a healthy outlook is an essential part of maintaining good health, especially as they continue their cardiac rehab journey.  Healthy Sleep for a Healthy Heart Clinical staff led group instruction and group discussion with PowerPoint presentation and patient guidebook. To enhance the learning environment the use of posters, models and videos may be added. At the conclusion of this workshop, patients will be able to demonstrate knowledge of the importance of sleep to overall health, well-being, and quality of life. They will understand the symptoms of, and treatments for, common sleep disorders. Patients will also be able to identify daytime and nighttime behaviors which impact sleep, and they will be able to apply these tools to help manage sleep-related challenges. The purpose of this lesson is to provide patients with a general overview of sleep and outline the importance of quality sleep. Patients will  learn about a few of the most common sleep disorders. Patients will also be introduced to the concept of "sleep hygiene," and discover ways to self-manage certain sleeping problems through simple daily behavior changes. Finally, the workshop will motivate patients by clarifying the links between quality sleep and their goals of heart-healthy living.   Recognizing and Reducing Stress Clinical staff led group instruction and group discussion with PowerPoint presentation and patient guidebook. To enhance the learning environment the use of posters, models and videos may be added. At the conclusion of this workshop, patients will be able to understand the types of stress reactions, differentiate between acute and chronic stress, and recognize the impact that chronic stress has on their health. They will also be able to apply different coping mechanisms, such as reframing negative self-talk. Patients will have the opportunity to practice a variety of stress management techniques, such as deep abdominal breathing, progressive muscle relaxation, and/or guided imagery.  The purpose of this lesson is to educate patients on the role of stress in their lives and to provide healthy techniques for coping with it.  Learning Barriers/Preferences:  Learning Barriers/Preferences - 05/13/23 1004       Learning Barriers/Preferences   Learning Barriers None    Learning Preferences Audio;Computer/Internet;Group Instruction;Individual Instruction;Pictoral;Skilled Demonstration;Video;Written Material;Verbal Instruction             Education Topics:  Knowledge Questionnaire Score:  Knowledge Questionnaire Score - 05/13/23 0938       Knowledge Questionnaire Score   Pre Score 19/24             Core Components/Risk Factors/Patient Goals at Admission:  Personal Goals and Risk Factors at  Admission - 05/13/23 0755       Core Components/Risk Factors/Patient Goals on Admission    Weight Management Yes     Intervention Weight Management: Develop a combined nutrition and exercise program designed to reach desired caloric intake, while maintaining appropriate intake of nutrient and fiber, sodium and fats, and appropriate energy expenditure required for the weight goal.;Weight Management: Provide education and appropriate resources to help participant work on and attain dietary goals.    Expected Outcomes Short Term: Continue to assess and modify interventions until short term weight is achieved;Long Term: Adherence to nutrition and physical activity/exercise program aimed toward attainment of established weight goal;Weight Loss: Understanding of general recommendations for a balanced deficit meal plan, which promotes 1-2 lb weight loss per week and includes a negative energy balance of [phone] kcal/d;Understanding recommendations for meals to include 15-35% energy as protein, 25-35% energy from fat, 35-60% energy from carbohydrates, less than 200mg  of dietary cholesterol, 20-35 gm of total fiber daily;Understanding of distribution of calorie intake throughout the day with the consumption of 4-5 meals/snacks    Diabetes Yes    Intervention Provide education about signs/symptoms and action to take for hypo/hyperglycemia.;Provide education about proper nutrition, including hydration, and aerobic/resistive exercise prescription along with prescribed medications to achieve blood glucose in normal ranges: Fasting glucose 65-99 mg/dL    Expected Outcomes Long Term: Attainment of HbA1C < 7%.;Short Term: Participant verbalizes understanding of the signs/symptoms and immediate care of hyper/hypoglycemia, proper foot care and importance of medication, aerobic/resistive exercise and nutrition plan for blood glucose control.    Hypertension Yes    Intervention Provide education on lifestyle modifcations including regular physical activity/exercise, weight management, moderate sodium restriction and increased consumption of  fresh fruit, vegetables, and low fat dairy, alcohol moderation, and smoking cessation.;Monitor prescription use compliance.    Expected Outcomes Short Term: Continued assessment and intervention until BP is < 140/54mm HG in hypertensive participants. < 130/36mm HG in hypertensive participants with diabetes, heart failure or chronic kidney disease.;Long Term: Maintenance of blood pressure at goal levels.    Lipids Yes    Intervention Provide education and support for participant on nutrition & aerobic/resistive exercise along with prescribed medications to achieve LDL 70mg , HDL >40mg .    Expected Outcomes Short Term: Participant states understanding of desired cholesterol values and is compliant with medications prescribed. Participant is following exercise prescription and nutrition guidelines.;Long Term: Cholesterol controlled with medications as prescribed, with individualized exercise RX and with personalized nutrition plan. Value goals: LDL < 70mg , HDL > 40 mg.    Stress Yes    Intervention Offer individual and/or small group education and counseling on adjustment to heart disease, stress management and health-related lifestyle change. Teach and support self-help strategies.;Refer participants experiencing significant psychosocial distress to appropriate mental health specialists for further evaluation and treatment. When possible, include family members and significant others in education/counseling sessions.    Expected Outcomes Short Term: Participant demonstrates changes in health-related behavior, relaxation and other stress management skills, ability to obtain effective social support, and compliance with psychotropic medications if prescribed.;Long Term: Emotional wellbeing is indicated by absence of clinically significant psychosocial distress or social isolation.             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 Introduction to [facility] education program/ intensive cardiac rehab. Initial orientation packet reviewed with patient.                Comments: Participant attended orientation for the cardiac rehabilitation program on  05/13/2023  to perform initial intake and exercise walk test. Patient introduced to the [facility] education and orientation packet was reviewed. Completed 6-minute walk test, measurements, initial ITP, and exercise prescription. Vital signs stable. Telemetry-normal sinus rhythm LBBB with rare PVCs, asymptomatic.   Service time was from [id number] to [phone].     Jonna Coup, MS 05/13/2023 10:07 AM

## 2023-05-17 ENCOUNTER — Encounter (HOSPITAL_COMMUNITY)
Admission: RE | Admit: 2023-05-17 | Discharge: 2023-05-17 | Disposition: A | Discharge status: Home / Self Care | Payer: Medicare Other | Source: Ambulatory Visit | Attending: Cardiovascular Disease | Admitting: Cardiovascular Disease

## 2023-05-17 DIAGNOSIS — Z955 Presence of coronary angioplasty implant and graft: Secondary | ICD-10-CM

## 2023-05-17 LAB — GLUCOSE, CAPILLARY
Glucose-Capillary: 111 mg/dL — ABNORMAL HIGH (ref 70–99)
Glucose-Capillary: 95 mg/dL (ref 70–99)

## 2023-05-17 NOTE — Progress Notes (Signed)
Daily Session Note  Patient Details  Name: Destiny Brown MRN: [id number] Date of Birth: [birth year] Referring Provider:   Flowsheet Row INTENSIVE CARDIAC REHAB ORIENT from 05/13/2023 in [facility]  Referring Provider Sherryl Manges, MD       Encounter Date: 05/17/2023  Check In:  Session Check In - 05/17/23 0913       Check-In   Supervising physician immediately available to respond to emergencies [facility] MD immediately available    Physician(s) Bernadene Person NP    Location [facility]    Staff Present Gladstone Lighter, RN, Marton Redwood, MS, ACSM-CEP, CCRP, Exercise Physiologist;Olinty Peggye Pitt, MS, ACSM-CEP, Exercise Physiologist;Marlen Mollica Cleophas Dunker, RN, MSN;Jetta Dan Humphreys BS, ACSM-CEP, Exercise Physiologist;Mary Gerre Scull, RN, BSN    Virtual Visit No    Medication changes reported     No    Fall or balance concerns reported    No    Tobacco Cessation No Change    Warm-up and Cool-down Performed as group-led instruction   CRP2 orientation   Resistance Training Performed Yes    VAD Patient? No    PAD/SET Patient? No      Pain Assessment   Currently in Pain? No/denies    Pain Score 0-No pain    Multiple Pain Sites No             Capillary Blood Glucose: Results for orders placed or performed during the hospital encounter of 05/17/23 (from the past 24 hour(s))  Glucose, capillary     Status: Abnormal   Collection Time: 05/17/23  9:16 AM  Result Value Ref Range   Glucose-Capillary 111 (H) 70 - 99 mg/dL      Social History   Tobacco Use  Smoking Status Never  Smokeless Tobacco Never    Goals Met:  Independence with exercise equipment Exercise tolerated well No report of concerns or symptoms today Strength training completed today  Goals Unmet:  Not Applicable  Comments: No issues.   Dr. Armanda Magic is Medical Director for Cardiac Rehab at [facility].

## 2023-05-17 NOTE — Progress Notes (Signed)
Pt in cardiac rehab today. Pt tolerated light exercise without difficulty. VSS, telemetry-NSR, asymptomatic. Medication list reconciled. Pt denies barriers to medicaiton compliance. PSYCHOSOCIAL ASSESSMENT:  PHQ 2-9/PHQ-9 scores 0/0. Pt exhibits positive coping skills, hopeful outlook with supportive family. No psychosocial needs identified at this time, no psychosocial interventions necessary. Pt enjoys spending time with family and grandchildren. Pt oriented to exercise equipment and routine. Understanding verbalized.

## 2023-05-19 ENCOUNTER — Encounter (HOSPITAL_COMMUNITY)
Admission: RE | Admit: 2023-05-19 | Discharge: 2023-05-19 | Disposition: A | Discharge status: Home / Self Care | Payer: Medicare Other | Source: Ambulatory Visit | Attending: Cardiovascular Disease | Admitting: Cardiovascular Disease

## 2023-05-19 DIAGNOSIS — Z955 Presence of coronary angioplasty implant and graft: Secondary | ICD-10-CM

## 2023-05-19 LAB — GLUCOSE, CAPILLARY
Glucose-Capillary: 107 mg/dL — ABNORMAL HIGH (ref 70–99)
Glucose-Capillary: 96 mg/dL (ref 70–99)

## 2023-05-21 ENCOUNTER — Encounter (HOSPITAL_COMMUNITY): Payer: Medicare Other

## 2023-05-25 ENCOUNTER — Other Ambulatory Visit: Payer: Self-pay | Admitting: Endocrinology

## 2023-05-25 DIAGNOSIS — Z0000 Encounter for general adult medical examination without abnormal findings: Secondary | ICD-10-CM

## 2023-05-26 ENCOUNTER — Encounter (HOSPITAL_COMMUNITY)
Admission: RE | Admit: 2023-05-26 | Discharge: 2023-05-26 | Disposition: A | Discharge status: Home / Self Care | Payer: Medicare Other | Source: Ambulatory Visit | Attending: Cardiovascular Disease | Admitting: Cardiovascular Disease

## 2023-05-26 DIAGNOSIS — Z955 Presence of coronary angioplasty implant and graft: Secondary | ICD-10-CM

## 2023-05-28 ENCOUNTER — Encounter (HOSPITAL_COMMUNITY)
Admission: RE | Admit: 2023-05-28 | Discharge: 2023-05-28 | Disposition: A | Discharge status: Home / Self Care | Payer: Medicare Other | Source: Ambulatory Visit | Attending: Cardiovascular Disease | Admitting: Cardiovascular Disease

## 2023-05-28 DIAGNOSIS — Z955 Presence of coronary angioplasty implant and graft: Secondary | ICD-10-CM | POA: Diagnosis not present

## 2023-05-31 ENCOUNTER — Encounter (HOSPITAL_COMMUNITY)
Admission: RE | Admit: 2023-05-31 | Discharge: 2023-05-31 | Disposition: A | Discharge status: Home / Self Care | Payer: Medicare Other | Source: Ambulatory Visit | Attending: Cardiovascular Disease | Admitting: Cardiovascular Disease

## 2023-05-31 DIAGNOSIS — Z955 Presence of coronary angioplasty implant and graft: Secondary | ICD-10-CM | POA: Diagnosis present

## 2023-06-01 NOTE — Progress Notes (Signed)
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 Use 1 lancet bid, Disp: , Rfl:    ACCU-CHEK SMARTVIEW test strip, 1 strip by Other route 2 (two) times daily. Use 1 strip to check glucose twice a day, Disp: , Rfl:    albuterol (VENTOLIN HFA) 108 (90 Base) MCG/ACT inhaler, Inhale 2 puffs into the lungs every 6 (six) hours as needed for wheezing or shortness of breath (Cough)., Disp: 18 g, Rfl: 2   amLODipine (NORVASC) 5 MG tablet, Take 5 mg by mouth in the morning., Disp: , Rfl:    apixaban (ELIQUIS) 5 MG TABS tablet, Take 1 tablet (5 mg total) by mouth 2 (two) times daily., Disp: 180 tablet, Rfl: 3   Cholecalciferol (VITAMIN D3) 1000 UNITS CAPS, Take 1,000 Units by mouth in the morning., Disp: , Rfl:    CLENPIQ 10-3.5-12 MG-GM -GM/175ML SOLN, 175 mL orally twice for 2, Disp: , Rfl:    clopidogrel (PLAVIX) 75 MG tablet, Take 1 tablet (75 mg total) by mouth daily., Disp: 30 tablet, Rfl: 2   hydrochlorothiazide (HYDRODIURIL) 25 MG tablet, Take 25 mg by mouth in the morning., Disp: , Rfl:    latanoprost (XALATAN) 0.005 % ophthalmic solution, Place 1 drop into both eyes at bedtime., Disp: , Rfl:    levothyroxine (SYNTHROID, LEVOTHROID) 125 MCG tablet, Take 125 mcg by mouth daily before breakfast., Disp: , Rfl:     losartan (COZAAR) 100 MG tablet, Take 1 tablet (100 mg total) by mouth daily., Disp: 90 tablet, Rfl: 3   metFORMIN (GLUCOPHAGE) 1000 MG tablet, Take 1,000 mg by mouth 2 (two) times daily., Disp: , Rfl:    pantoprazole (PROTONIX) 40 MG tablet, TAKE 1 TABLET BY MOUTH EVERY DAY, Disp: 90 tablet, Rfl: 1   rosuvastatin (CRESTOR) 5 MG tablet, Take 5 mg by mouth 2 (two) times a week. Tuesdays & Thursdays., Disp: , Rfl:   Past Medical History: Past Medical History:  Diagnosis Date   Arthritis    Atrial fibrillation with RVR (HCC) 09/21/2014   Bronchitis    h/o   Diabetes mellitus    NO MEDS,  DIET CONTROLLED   History of migraines    "when working under alot of stress"   Hypertension    Hypothyroid    LBBB (left bundle branch block) 09/22/2014   OSA (obstructive sleep apnea) 04/09/2015   Moderate OSA with AHI 16.7 events per hour with successful CPAP titration to 8cm H2O   PONV (postoperative nausea and vomiting)    PT DOESNOT WANT TO TAKE HER ATENOLOL     DOS.   " IT DROPS MY BP TO LOW"    Tobacco Use: Social History   Tobacco Use  Smoking Status Never  Smokeless Tobacco Never    Labs: Review Flowsheet       Latest Ref Rng &  Units 04/01/2010 04/02/2010 09/21/2014 09/22/2014  Labs for ITP Cardiac and Pulmonary Rehab  Cholestrol 0 - 200 mg/dL - 308        ATP III CLASSIFICATION:  <200     mg/dL   Desirable  657-846  mg/dL   Borderline High  >=962    mg/dL   High         - 952   LDL (calc) 0 - 99 mg/dL - 87        Total Cholesterol/HDL:CHD Risk Coronary Heart Disease Risk Table                     Men   Women  1/2 Average Risk   3.4   3.3  Average Risk       5.0   4.4  2 X Average Risk   9.6   7.1  3 X Average Risk  23.4   11.0        Use the calculated Patient Ratio above and the CHD Risk Table to determine the patient's CHD Risk.        ATP III CLASSIFICATION (LDL):  <100     mg/dL   Optimal  841-324  mg/dL   Near or Above                    Optimal  130-159  mg/dL    Borderline  401-027  mg/dL   High  >253     mg/dL   Very High  - 91   HDL-C >39 mg/dL - 36  - 57   Trlycerides <150 mg/dL - 99  - 664   Hemoglobin A1c <5.7 % 9.1 (NOTE) The ADA recommends the following therapeutic goal for glycemic control related to Hgb A1c measurement: Goal of therapy: <6.5 Hgb A1c  Reference: American Diabetes Association: Clinical Practice Recommendations 2010, Diabetes Care, 2010, 33: (Suppl  1).  - 8.1  -  TCO2 0 - 100 mmol/L 26  - - -    Capillary Blood Glucose: Lab Results  Component Value Date   GLUCAP 96 05/19/2023   GLUCAP 107 (H) 05/19/2023   GLUCAP 95 05/17/2023   GLUCAP 111 (H) 05/17/2023   GLUCAP 147 (H) 04/23/2023     Exercise Target Goals: Exercise Program Goal: Individual exercise prescription set using results from initial 6 min walk test and THRR while considering  patient's activity barriers and safety.   Exercise Prescription Goal: Initial exercise prescription builds to 30-45 minutes a day of aerobic activity, 2-3 days per week.  Home exercise guidelines will be given to patient during program as part of exercise prescription that the participant will acknowledge.  Activity Barriers & Risk Stratification:  Activity Barriers & Cardiac Risk Stratification - 05/13/23 1001       Activity Barriers & Cardiac Risk Stratification   Activity Barriers Deconditioning;Shortness of Breath;Balance Concerns;History of Falls;Right Knee Replacement;Arthritis;Back Problems;Assistive Device    Cardiac Risk Stratification High   <5 METs on            6 Minute Walk:  6 Minute Walk     Row Name 05/13/23 0800         6 Minute Walk   Phase Initial     Distance 874 feet  Used Cane     Walk Time 6 minutes     # of Rest Breaks 1  3:00-4:10, stopped due to HR 130. resumed once within target     MPH 1.7  METS 2.1     RPE 13     Perceived Dyspnea  1.5     VO2 Peak 7.23     Symptoms Yes (comment)     Comments SOB, RPD = 1.5. Resolved with  rest. No pain     Resting HR 78 bpm     Resting BP 122/84     Resting Oxygen Saturation  98 %     Exercise Oxygen Saturation  during 6 min walk 100 %     Max Ex. HR 137 bpm     Max Ex. BP 156/76     2 Minute Post BP 128/74              Oxygen Initial Assessment:   Oxygen Re-Evaluation:   Oxygen Discharge (Final Oxygen Re-Evaluation):   Initial Exercise Prescription:  Initial Exercise Prescription - 05/13/23 1000       Date of Initial Exercise RX and Referring Provider   Date 05/13/23    Referring Provider Sherryl Manges, MD    Expected Discharge Date 07/23/23      NuStep   Level 1    SPM 85    Minutes 15    METs 2      Prescription Details   Frequency (times per week) 3    Duration Progress to 30 minutes of continuous aerobic without signs/symptoms of physical distress      Intensity   THRR 40-80% of Max Heartrate 57-114    Ratings of Perceived Exertion 11-13    Perceived Dyspnea 0-4      Progression   Progression Continue progressive overload as per policy without signs/symptoms or physical distress.      Resistance Training   Training Prescription Yes    Weight 2    Reps 10-15             Perform Capillary Blood Glucose checks as needed.  Exercise Prescription Changes:   Exercise Prescription Changes     Row Name 05/17/23 1600 05/31/23 1030           Response to Exercise   Blood Pressure (Admit) 140/74 120/70      Blood Pressure (Exercise) 138/74 168/72      Blood Pressure (Exit) 114/74 128/64      Heart Rate (Admit) 70 bpm 65 bpm      Heart Rate (Exercise) 98 bpm 106 bpm      Heart Rate (Exit) 67 bpm 67 bpm      Rating of Perceived Exertion (Exercise) 9 11.5      Symptoms None None      Comments OPt's first day in the CRP2 program Reviewed METs      Duration Continue with 30 min of aerobic exercise without signs/symptoms of physical distress. Continue with 30 min of aerobic exercise without signs/symptoms of physical distress.       Intensity THRR unchanged THRR unchanged        Progression   Progression Continue to progress workloads to maintain intensity without signs/symptoms of physical distress. Continue to progress workloads to maintain intensity without signs/symptoms of physical distress.      Average METs 1.7 1.9        Resistance Training   Training Prescription Yes Yes      Weight 2 2      Reps 10-15 10-15      Time 10 Minutes 10 Minutes        Interval Training   Interval Training No No  NuStep   Level 1 1      SPM 81 121      Minutes 15 30      METs 1.7 1.9               Exercise Comments:   Exercise Comments     Row Name 05/17/23 1622 05/31/23 1030         Exercise Comments Pt's firs day in the cRP2 program. Pt is off to a good start. No complaints today. Reviewed METs. Pt is making good progress.               Exercise Goals and Review:   Exercise Goals     Row Name 05/13/23 0754             Exercise Goals   Increase Physical Activity Yes       Intervention Provide advice, education, support and counseling about physical activity/exercise needs.;Develop an individualized exercise prescription for aerobic and resistive training based on initial evaluation findings, risk stratification, comorbidities and participant's personal goals.       Expected Outcomes Short Term: Attend rehab on a regular basis to increase amount of physical activity.;Long Term: Exercising regularly at least 3-5 days a week.;Long Term: Add in home exercise to make exercise part of routine and to increase amount of physical activity.       Increase Strength and Stamina Yes       Intervention Provide advice, education, support and counseling about physical activity/exercise needs.;Develop an individualized exercise prescription for aerobic and resistive training based on initial evaluation findings, risk stratification, comorbidities and participant's personal goals.       Expected Outcomes Short  Term: Increase workloads from initial exercise prescription for resistance, speed, and METs.;Short Term: Perform resistance training exercises routinely during rehab and add in resistance training at home;Long Term: Improve cardiorespiratory fitness, muscular endurance and strength as measured by increased METs and functional capacity ( )       Able to understand and use rate of perceived exertion (RPE) scale Yes       Intervention Provide education and explanation on how to use RPE scale       Expected Outcomes Short Term: Able to use RPE daily in rehab to express subjective intensity level;Long Term:  Able to use RPE to guide intensity level when exercising independently       Able to understand and use Dyspnea scale Yes       Intervention Provide education and explanation on how to use Dyspnea scale       Expected Outcomes Short Term: Able to use Dyspnea scale daily in rehab to express subjective sense of shortness of breath during exertion;Long Term: Able to use Dyspnea scale to guide intensity level when exercising independently       Knowledge and understanding of Target Heart Rate Range (THRR) Yes       Intervention Provide education and explanation of THRR including how the numbers were predicted and where they are located for reference       Expected Outcomes Short Term: Able to state/look up THRR;Short Term: Able to use daily as guideline for intensity in rehab;Long Term: Able to use THRR to govern intensity when exercising independently       Understanding of Exercise Prescription Yes       Intervention Provide education, explanation, and written materials on patient's individual exercise prescription       Expected Outcomes Short Term: Able to explain program exercise prescription;Long  Term: Able to explain home exercise prescription to exercise independently                Exercise Goals Re-Evaluation :  Exercise Goals Re-Evaluation     Row Name 05/17/23 1621              Exercise Goal Re-Evaluation   Exercise Goals Review Increase Physical Activity;Increase Strength and Stamina;Able to understand and use rate of perceived exertion (RPE) scale;Knowledge and understanding of Target Heart Rate Range (THRR);Understanding of Exercise Prescription       Comments Pt's first day in the cRP2 program. Pt understands the exercise Rx, RPE scale and THRR.       Expected Outcomes Will continue to monitor patient and progress exercise workloads as tolerated.                Discharge Exercise Prescription (Final Exercise Prescription Changes):  Exercise Prescription Changes - 05/31/23 1030       Response to Exercise   Blood Pressure (Admit) 120/70    Blood Pressure (Exercise) 168/72    Blood Pressure (Exit) 128/64    Heart Rate (Admit) 65 bpm    Heart Rate (Exercise) 106 bpm    Heart Rate (Exit) 67 bpm    Rating of Perceived Exertion (Exercise) 11.5    Symptoms None    Comments Reviewed METs    Duration Continue with 30 min of aerobic exercise without signs/symptoms of physical distress.    Intensity THRR unchanged      Progression   Progression Continue to progress workloads to maintain intensity without signs/symptoms of physical distress.    Average METs 1.9      Resistance Training   Training Prescription Yes    Weight 2    Reps 10-15    Time 10 Minutes      Interval Training   Interval Training No      NuStep   Level 1    SPM 121    Minutes 30    METs 1.9             Nutrition:  Target Goals: Understanding of nutrition guidelines, daily intake of sodium 1500mg , cholesterol 200mg , calories 30% from fat and 7% or less from saturated fats, daily to have 5 or more servings of fruits and vegetables.  Biometrics:  Pre Biometrics - 05/13/23 0753       Pre Biometrics   Waist Circumference 46.25 inches    Hip Circumference 50.5 inches    Waist to Hip Ratio 0.92 %    Triceps Skinfold 35 mm    % Body Fat 47 %    Grip Strength 16 kg     Flexibility 10.5 in    Single Leg Stand 3.5 seconds              Nutrition Therapy Plan and Nutrition Goals:  Nutrition Therapy & Goals - 05/17/23 0938       Nutrition Therapy   Diet Heart healthy diet    Drug/Food Interactions Statins/Certain Fruits      Personal Nutrition Goals   Nutrition Goal Patient to identify strategies for reducing cardiovascular risk by attending the Pritikin education and nutrition series weekly.    Personal Goal #2 Patient to improve diet quality by using the plate method as a guide for meal planning to include lean protein/plant protein, fruits, vegetables, whole grains, nonfat dairy as part of a well-balanced diet.    Comments Skilar's blood sugar is well controlled with diet and  metformin. Patient will benefit from participation in intensive cardiac rehab for nutrition, exercise, and lifestyle modification.      Intervention Plan   Intervention Prescribe, educate and counsel regarding individualized specific dietary modifications aiming towards targeted core components such as weight, hypertension, lipid management, diabetes, heart failure and other comorbidities.;Nutrition handout(s) given to patient.    Expected Outcomes Short Term Goal: Understand basic principles of dietary content, such as calories, fat, sodium, cholesterol and nutrients.;Long Term Goal: Adherence to prescribed nutrition plan.             Nutrition Assessments:  Nutrition Assessments - 05/17/23 1144       Rate Your Plate Scores   Pre Score 63            MEDIFICTS Score Key: ?70 Need to make dietary changes  40-70 Heart Healthy Diet ? 40 Therapeutic Level Cholesterol Diet   Flowsheet Row INTENSIVE CARDIAC REHAB from 05/17/2023 in [facility] for Heart, Vascular, & Lung Health  Picture Your Plate Total Score on Admission 63      Picture Your Plate Scores: <32 Unhealthy dietary pattern with much room for improvement. 41-50 Dietary pattern  unlikely to meet recommendations for good health and room for improvement. 51-60 More healthful dietary pattern, with some room for improvement.  >60 Healthy dietary pattern, although there may be some specific behaviors that could be improved.    Nutrition Goals Re-Evaluation:  Nutrition Goals Re-Evaluation     Row Name 05/17/23 0938             Goals   Current Weight 191 lb 2.2 oz (86.7 kg)       Comment A1c 5.9, lipids WNL       Expected Outcome Rosmary's blood sugar is well controlled with diet and metformin. Patient will benefit from participation in intensive cardiac rehab for nutrition, exercise, and lifestyle modification.                Nutrition Goals Re-Evaluation:  Nutrition Goals Re-Evaluation     Row Name 05/17/23 0938             Goals   Current Weight 191 lb 2.2 oz (86.7 kg)       Comment A1c 5.9, lipids WNL       Expected Outcome Rhilee's blood sugar is well controlled with diet and metformin. Patient will benefit from participation in intensive cardiac rehab for nutrition, exercise, and lifestyle modification.                Nutrition Goals Discharge (Final Nutrition Goals Re-Evaluation):  Nutrition Goals Re-Evaluation - 05/17/23 9518       Goals   Current Weight 191 lb 2.2 oz (86.7 kg)    Comment A1c 5.9, lipids WNL    Expected Outcome Nirel's blood sugar is well controlled with diet and metformin. Patient will benefit from participation in intensive cardiac rehab for nutrition, exercise, and lifestyle modification.             Psychosocial: Target Goals: Acknowledge presence or absence of significant depression and/or stress, maximize coping skills, provide positive support system. Participant is able to verbalize types and ability to use techniques and skills needed for reducing stress and depression.  Initial Review & Psychosocial Screening:  Initial Psych Review & Screening - 06/01/23 1053       Initial Review   Current issues  with Current Stress Concerns    Source of Stress Concerns Family    Comments  Khloie and her husband help take care of her tow granchildren as her daughter has bipolar disorder. Chlora's granson is Autistic he is able to to live independantly with supervision      Family Dynamics   Good Support System? Yes   Detra has her husband and son who lives in [place] for support     Barriers   Psychosocial barriers to participate in program The patient should benefit from training in stress management and relaxation.      Screening Interventions   Interventions To provide support and resources with identified psychosocial needs    Expected Outcomes Long Term Goal: Stressors or current issues are controlled or eliminated.             Quality of Life Scores:  Quality of Life - 05/13/23 0938       Quality of Life   Select Quality of Life      Quality of Life Scores   Health/Function Pre 22.7 %    Socioeconomic Pre 25.07 %    Psych/Spiritual Pre 24.86 %    Family Pre 22.5 %    GLOBAL Pre 23.6 %            Scores of 19 and below usually indicate a poorer quality of life in these areas.  A difference of  2-3 points is a clinically meaningful difference.  A difference of 2-3 points in the total score of the Quality of Life Index has been associated with significant improvement in overall quality of life, self-image, physical symptoms, and general health in studies assessing change in quality of life.  PHQ-9: Review Flowsheet       05/13/2023  Depression screen PHQ 2/9  Decreased Interest 0  Down, Depressed, Hopeless 0  PHQ - 2 Score 0  Altered sleeping 0  Tired, decreased energy 0  Change in appetite 0  Feeling bad or failure about yourself  0  Trouble concentrating 0  Moving slowly or fidgety/restless 0  Suicidal thoughts 0  PHQ-9 Score 0   Interpretation of Total Score  Total Score Depression Severity:  1-4 = Minimal depression, 5-9 = Mild depression, 10-14 =  Moderate depression, 15-19 = Moderately severe depression, 20-27 = Severe depression   Psychosocial Evaluation and Intervention:   Psychosocial Re-Evaluation:   Psychosocial Discharge (Final Psychosocial Re-Evaluation):   Vocational Rehabilitation: Provide vocational rehab assistance to qualifying candidates.   Vocational Rehab Evaluation & Intervention:  Vocational Rehab - 05/13/23 0847       Initial Vocational Rehab Evaluation & Intervention   Assessment shows need for Vocational Rehabilitation No   Jamilia works full time and does not need vocational rehab at this time            Education: Education Goals: Education classes will be provided on a weekly basis, covering required topics. Participant will state understanding/return demonstration of topics presented.    Education     Row Name 05/17/23 1000     Education   Cardiac Education Topics Pritikin   Geographical information systems officer Psychosocial   Psychosocial Workshop From Head to Heart: The Power of a Healthy Outlook   Instruction Review Code 1- Verbalizes Understanding   Class Start Time 0815   Class Stop Time 0903   Class Time Calculation (min) 48 min    Row Name 05/19/23 0900     Education   Cardiac Education Topics Pritikin   [employer]  Secondary school teacher   Weekly Topic Powerhouse Plant-Based Proteins   Instruction Review Code 1- Verbalizes Understanding   Class Start Time [phone]   Class Stop Time 0845   Class Time Calculation (min) 35 min    Row Name 05/26/23 1100     Education   Cardiac Education Topics Pritikin   Secondary school teacher School   Educator Dietitian   Weekly Topic Adding Flavor - Sodium-Free   Instruction Review Code 1- Verbalizes Understanding   Class Start Time [phone]   Class Stop Time 0845   Class Time Calculation (min) 33 min    Row Name 05/28/23 1400     Education   Cardiac  Education Topics Pritikin   Psychologist, forensic General Education   General Education Heart Disease Risk Reduction   Instruction Review Code 1- Verbalizes Understanding   Class Start Time 0810   Class Stop Time 0849   Class Time Calculation (min) 39 min    Row Name 05/31/23 0900     Education   Cardiac Education Topics Pritikin   Select Workshops     Workshops   Educator Exercise Physiologist   Select Exercise   Exercise Workshop Location manager and Fall Prevention   Instruction Review Code 1- Verbalizes Understanding   Class Start Time [phone]   Class Stop Time 0855   Class Time Calculation (min) 36 min            Core Videos: Exercise    Move It!  Clinical staff conducted group or individual video education with verbal and written material and guidebook.  Patient learns the recommended Pritikin exercise program. Exercise with the goal of living a long, healthy life. Some of the health benefits of exercise include controlled diabetes, healthier blood pressure levels, improved cholesterol levels, improved heart and lung capacity, improved sleep, and better body composition. Everyone should speak with their doctor before starting or changing an exercise routine.  Biomechanical Limitations Clinical staff conducted group or individual video education with verbal and written material and guidebook.  Patient learns how biomechanical limitations can impact exercise and how we can mitigate and possibly overcome limitations to have an impactful and balanced exercise routine.  Body Composition Clinical staff conducted group or individual video education with verbal and written material and guidebook.  Patient learns that body composition (ratio of muscle mass to fat mass) is a key component to assessing overall fitness, rather than body weight alone. Increased fat mass, especially visceral belly fat, can put [place] at increased  risk for metabolic syndrome, type 2 diabetes, heart disease, and even death. It is recommended to combine diet and exercise (cardiovascular and resistance training) to improve your body composition. Seek guidance from your physician and exercise physiologist before implementing an exercise routine.  Exercise Action Plan Clinical staff conducted group or individual video education with verbal and written material and guidebook.  Patient learns the recommended strategies to achieve and enjoy long-term exercise adherence, including variety, self-motivation, self-efficacy, and positive decision making. Benefits of exercise include fitness, good health, weight management, more energy, better sleep, less stress, and overall well-being.  Medical   Heart Disease Risk Reduction Clinical staff conducted group or individual video education with verbal and written material and guidebook.  Patient learns our heart is our most vital organ as it circulates oxygen, nutrients, white blood cells, and hormones throughout the entire  body, and carries waste away. Data supports a plant-based eating plan like the Pritikin Program for its effectiveness in slowing progression of and reversing heart disease. The video provides a number of recommendations to address heart disease.   Metabolic Syndrome and Belly Fat  Clinical staff conducted group or individual video education with verbal and written material and guidebook.  Patient learns what metabolic syndrome is, how it leads to heart disease, and how one can reverse it and keep it from coming back. You have metabolic syndrome if you have 3 of the following 5 criteria: abdominal obesity, high blood pressure, high triglycerides, low HDL cholesterol, and high blood sugar.  Hypertension and Heart Disease Clinical staff conducted group or individual video education with verbal and written material and guidebook.  Patient learns that high blood pressure, or hypertension, is  very common in the [place]. Hypertension is largely due to excessive salt intake, but other important risk factors include being overweight, physical inactivity, drinking too much alcohol, smoking, and not eating enough potassium from fruits and vegetables. High blood pressure is a leading risk factor for heart attack, stroke, congestive heart failure, dementia, kidney failure, and premature death. Long-term effects of excessive salt intake include stiffening of the arteries and thickening of heart muscle and organ damage. Recommendations include ways to reduce hypertension and the risk of heart disease.  Diseases of Our Time - Focusing on Diabetes Clinical staff conducted group or individual video education with verbal and written material and guidebook.  Patient learns why the best way to stop diseases of our time is prevention, through food and other lifestyle changes. Medicine (such as prescription pills and surgeries) is often only a Band-Aid on the problem, not a long-term solution. Most common diseases of our time include obesity, type 2 diabetes, hypertension, heart disease, and cancer. The Pritikin Program is recommended and has been proven to help reduce, reverse, and/or prevent the damaging effects of metabolic syndrome.  Nutrition   Overview of the Pritikin Eating Plan  Clinical staff conducted group or individual video education with verbal and written material and guidebook.  Patient learns about the Pritikin Eating Plan for disease risk reduction. The Pritikin Eating Plan emphasizes a wide variety of unrefined, minimally-processed carbohydrates, like fruits, vegetables, whole grains, and legumes. Go, Caution, and Stop food choices are explained. Plant-based and lean animal proteins are emphasized. Rationale provided for low sodium intake for blood pressure control, low added sugars for blood sugar stabilization, and low added fats and oils for coronary artery disease risk reduction and  weight management.  Calorie Density  Clinical staff conducted group or individual video education with verbal and written material and guidebook.  Patient learns about calorie density and how it impacts the Pritikin Eating Plan. Knowing the characteristics of the food you choose will help you decide whether those foods will lead to weight gain or weight loss, and whether you want to consume more or less of them. Weight loss is usually a side effect of the Pritikin Eating Plan because of its focus on low calorie-dense foods.  Label Reading  Clinical staff conducted group or individual video education with verbal and written material and guidebook.  Patient learns about the Pritikin recommended label reading guidelines and corresponding recommendations regarding calorie density, added sugars, sodium content, and whole grains.  Dining Out - Part 1  Clinical staff conducted group or individual video education with verbal and written material and guidebook.  Patient learns that restaurant meals can be sabotaging because  they can be so high in calories, fat, sodium, and/or sugar. Patient learns recommended strategies on how to positively address this and avoid unhealthy pitfalls.  Facts on Fats  Clinical staff conducted group or individual video education with verbal and written material and guidebook.  Patient learns that lifestyle modifications can be just as effective, if not more so, as many medications for lowering your risk of heart disease. A Pritikin lifestyle can help to reduce your risk of inflammation and atherosclerosis (cholesterol build-up, or plaque, in the artery walls). Lifestyle interventions such as dietary choices and physical activity address the cause of atherosclerosis. A review of the types of fats and their impact on blood cholesterol levels, along with dietary recommendations to reduce fat intake is also included.  Nutrition Action Plan  Clinical staff conducted group or  individual video education with verbal and written material and guidebook.  Patient learns how to incorporate Pritikin recommendations into their lifestyle. Recommendations include planning and keeping personal health goals in mind as an important part of their success.  Healthy Mind-Set    Healthy Minds, Bodies, Hearts  Clinical staff conducted group or individual video education with verbal and written material and guidebook.  Patient learns how to identify when they are stressed. Video will discuss the impact of that stress, as well as the many benefits of stress management. Patient will also be introduced to stress management techniques. The way we think, act, and feel has an impact on our hearts.  How Our Thoughts Can Heal Our Hearts  Clinical staff conducted group or individual video education with verbal and written material and guidebook.  Patient learns that negative thoughts can cause depression and anxiety. This can result in negative lifestyle behavior and serious health problems. Cognitive behavioral therapy is an effective method to help control our thoughts in order to change and improve our emotional outlook.  Additional Videos:  Exercise    Improving Performance  Clinical staff conducted group or individual video education with verbal and written material and guidebook.  Patient learns to use a non-linear approach by alternating intensity levels and lengths of time spent exercising to help burn more calories and lose more body fat. Cardiovascular exercise helps improve heart health, metabolism, hormonal balance, blood sugar control, and recovery from fatigue. Resistance training improves strength, endurance, balance, coordination, reaction time, metabolism, and muscle mass. Flexibility exercise improves circulation, posture, and balance. Seek guidance from your physician and exercise physiologist before implementing an exercise routine and learn your capabilities and proper form for  all exercise.  Introduction to Yoga  Clinical staff conducted group or individual video education with verbal and written material and guidebook.  Patient learns about yoga, a discipline of the coming together of mind, breath, and body. The benefits of yoga include improved flexibility, improved range of motion, better posture and core strength, increased lung function, weight loss, and positive self-image. Yoga's heart health benefits include lowered blood pressure, healthier heart rate, decreased cholesterol and triglyceride levels, improved immune function, and reduced stress. Seek guidance from your physician and exercise physiologist before implementing an exercise routine and learn your capabilities and proper form for all exercise.  Medical   Aging: Enhancing Your Quality of Life  Clinical staff conducted group or individual video education with verbal and written material and guidebook.  Patient learns key strategies and recommendations to stay in good physical health and enhance quality of life, such as prevention strategies, having an advocate, securing a Health Care Proxy and Power of [place],  and keeping a list of medications and system for tracking them. It also discusses how to avoid risk for bone loss.  Biology of Weight Control  Clinical staff conducted group or individual video education with verbal and written material and guidebook.  Patient learns that weight gain occurs because we consume more calories than we burn (eating more, moving less). Even if your body weight is normal, you may have higher ratios of fat compared to muscle mass. Too much body fat puts you at increased risk for cardiovascular disease, heart attack, stroke, type 2 diabetes, and obesity-related cancers. In addition to exercise, following the Pritikin Eating Plan can help reduce your risk.  Decoding Lab Results  Clinical staff conducted group or individual video education with verbal and written material and  guidebook.  Patient learns that lab test reflects one measurement whose values change over time and are influenced by many factors, including medication, stress, sleep, exercise, food, hydration, pre-existing medical conditions, and more. It is recommended to use the knowledge from this video to become more involved with your lab results and evaluate your numbers to speak with your doctor.   Diseases of Our Time - Overview  Clinical staff conducted group or individual video education with verbal and written material and guidebook.  Patient learns that according to the CDC, 50% to 70% of chronic diseases (such as obesity, type 2 diabetes, elevated lipids, hypertension, and heart disease) are avoidable through lifestyle improvements including healthier food choices, listening to satiety cues, and increased physical activity.  Sleep Disorders Clinical staff conducted group or individual video education with verbal and written material and guidebook.  Patient learns how good quality and duration of sleep are important to overall health and well-being. Patient also learns about sleep disorders and how they impact health along with recommendations to address them, including discussing with a physician.  Nutrition  Dining Out - Part 2 Clinical staff conducted group or individual video education with verbal and written material and guidebook.  Patient learns how to plan ahead and communicate in order to maximize their dining experience in a healthy and nutritious manner. Included are recommended food choices based on the type of restaurant the patient is visiting.   Fueling a Banker conducted group or individual video education with verbal and written material and guidebook.  There is a strong connection between our food choices and our health. Diseases like obesity and type 2 diabetes are very prevalent and are in large-part due to lifestyle choices. The Pritikin Eating Plan  provides plenty of food and hunger-curbing satisfaction. It is easy to follow, affordable, and helps reduce health risks.  Menu Workshop  Clinical staff conducted group or individual video education with verbal and written material and guidebook.  Patient learns that restaurant meals can sabotage health goals because they are often packed with calories, fat, sodium, and sugar. Recommendations include strategies to plan ahead and to communicate with the manager, chef, or server to help order a healthier meal.  Planning Your Eating Strategy  Clinical staff conducted group or individual video education with verbal and written material and guidebook.  Patient learns about the Pritikin Eating Plan and its benefit of reducing the risk of disease. The Pritikin Eating Plan does not focus on calories. Instead, it emphasizes high-quality, nutrient-rich foods. By knowing the characteristics of the foods, we choose, we can determine their calorie density and make informed decisions.  Targeting Your Nutrition Priorities  Clinical staff conducted group or individual video  education with verbal and written material and guidebook.  Patient learns that lifestyle habits have a tremendous impact on disease risk and progression. This video provides eating and physical activity recommendations based on your personal health goals, such as reducing LDL cholesterol, losing weight, preventing or controlling type 2 diabetes, and reducing high blood pressure.  Vitamins and Minerals  Clinical staff conducted group or individual video education with verbal and written material and guidebook.  Patient learns different ways to obtain key vitamins and minerals, including through a recommended healthy diet. It is important to discuss all supplements you take with your doctor.   Healthy Mind-Set    Smoking Cessation  Clinical staff conducted group or individual video education with verbal and written material and guidebook.   Patient learns that cigarette smoking and tobacco addiction pose a serious health risk which affects millions of people. Stopping smoking will significantly reduce the risk of heart disease, lung disease, and many forms of cancer. Recommended strategies for quitting are covered, including working with your doctor to develop a successful plan.  Culinary   Becoming a Set designer conducted group or individual video education with verbal and written material and guidebook.  Patient learns that cooking at home can be healthy, cost-effective, quick, and puts them in control. Keys to cooking healthy recipes will include looking at your recipe, assessing your equipment needs, planning ahead, making it simple, choosing cost-effective seasonal ingredients, and limiting the use of added fats, salts, and sugars.  Cooking - Breakfast and Snacks  Clinical staff conducted group or individual video education with verbal and written material and guidebook.  Patient learns how important breakfast is to satiety and nutrition through the entire day. Recommendations include key foods to eat during breakfast to help stabilize blood sugar levels and to prevent overeating at meals later in the day. Planning ahead is also a key component.  Cooking - Educational psychologist conducted group or individual video education with verbal and written material and guidebook.  Patient learns eating strategies to improve overall health, including an approach to cook more at home. Recommendations include thinking of animal protein as a side on your plate rather than center stage and focusing instead on lower calorie dense options like vegetables, fruits, whole grains, and plant-based proteins, such as beans. Making sauces in large quantities to freeze for later and leaving the skin on your vegetables are also recommended to maximize your experience.  Cooking - Healthy Salads and Dressing Clinical staff  conducted group or individual video education with verbal and written material and guidebook.  Patient learns that vegetables, fruits, whole grains, and legumes are the foundations of the Pritikin Eating Plan. Recommendations include how to incorporate each of these in flavorful and healthy salads, and how to create homemade salad dressings. Proper handling of ingredients is also covered. Cooking - Soups and [employer] - Soups and Desserts Clinical staff conducted group or individual video education with verbal and written material and guidebook.  Patient learns that Pritikin soups and desserts make for easy, nutritious, and delicious snacks and meal components that are low in sodium, fat, sugar, and calorie density, while high in vitamins, minerals, and filling fiber. Recommendations include simple and healthy ideas for soups and desserts.   Overview     The Pritikin Solution Program Overview Clinical staff conducted group or individual video education with verbal and written material and guidebook.  Patient learns that the results of the Pritikin Program have  been documented in more than 100 articles published in peer-reviewed journals, and the benefits include reducing risk factors for (and, in some cases, even reversing) high cholesterol, high blood pressure, type 2 diabetes, obesity, and more! An overview of the three key pillars of the Pritikin Program will be covered: eating well, doing regular exercise, and having a healthy mind-set.  WORKSHOPS  Exercise: Exercise Basics: Building Your Action Plan Clinical staff led group instruction and group discussion with PowerPoint presentation and patient guidebook. To enhance the learning environment the use of posters, models and videos may be added. At the conclusion of this workshop, patients will comprehend the difference between physical activity and exercise, as well as the benefits of incorporating both, into their routine. Patients will  understand the FITT (Frequency, Intensity, Time, and Type) principle and how to use it to build an exercise action plan. In addition, safety concerns and other considerations for exercise and cardiac rehab will be addressed by the presenter. The purpose of this lesson is to promote a comprehensive and effective weekly exercise routine in order to improve patients' overall level of fitness.   Managing Heart Disease: Your Path to a Healthier Heart Clinical staff led group instruction and group discussion with PowerPoint presentation and patient guidebook. To enhance the learning environment the use of posters, models and videos may be added.At the conclusion of this workshop, patients will understand the anatomy and physiology of the heart. Additionally, they will understand how Pritikin's three pillars impact the risk factors, the progression, and the management of heart disease.  The purpose of this lesson is to provide a high-level overview of the heart, heart disease, and how the Pritikin lifestyle positively impacts risk factors.  Exercise Biomechanics Clinical staff led group instruction and group discussion with PowerPoint presentation and patient guidebook. To enhance the learning environment the use of posters, models and videos may be added. Patients will learn how the structural parts of their bodies function and how these functions impact their daily activities, movement, and exercise. Patients will learn how to promote a neutral spine, learn how to manage pain, and identify ways to improve their physical movement in order to promote healthy living. The purpose of this lesson is to expose patients to common physical limitations that impact physical activity. Participants will learn practical ways to adapt and manage aches and pains, and to minimize their effect on regular exercise. Patients will learn how to maintain good posture while sitting, walking, and lifting.  Balance Training  and Fall Prevention  Clinical staff led group instruction and group discussion with PowerPoint presentation and patient guidebook. To enhance the learning environment the use of posters, models and videos may be added. At the conclusion of this workshop, patients will understand the importance of their sensorimotor skills (vision, proprioception, and the vestibular system) in maintaining their ability to balance as they age. Patients will apply a variety of balancing exercises that are appropriate for their current level of function. Patients will understand the common causes for poor balance, possible solutions to these problems, and ways to modify their physical environment in order to minimize their fall risk. The purpose of this lesson is to teach patients about the importance of maintaining balance as they age and ways to minimize their risk of falling.  WORKSHOPS   Nutrition:  Fueling a Ship broker led group instruction and group discussion with PowerPoint presentation and patient guidebook. To enhance the learning environment the use of posters, models and videos may be  added. Patients will review the foundational principles of the Pritikin Eating Plan and understand what constitutes a serving size in each of the food groups. Patients will also learn Pritikin-friendly foods that are better choices when away from home and review make-ahead meal and snack options. Calorie density will be reviewed and applied to three nutrition priorities: weight maintenance, weight loss, and weight gain. The purpose of this lesson is to reinforce (in a group setting) the key concepts around what patients are recommended to eat and how to apply these guidelines when away from home by planning and selecting Pritikin-friendly options. Patients will understand how calorie density may be adjusted for different weight management goals.  Mindful Eating  Clinical staff led group instruction and group  discussion with PowerPoint presentation and patient guidebook. To enhance the learning environment the use of posters, models and videos may be added. Patients will briefly review the concepts of the Pritikin Eating Plan and the importance of low-calorie dense foods. The concept of mindful eating will be introduced as well as the importance of paying attention to internal hunger signals. Triggers for non-hunger eating and techniques for dealing with triggers will be explored. The purpose of this lesson is to provide patients with the opportunity to review the basic principles of the Pritikin Eating Plan, discuss the value of eating mindfully and how to measure internal cues of hunger and fullness using the Hunger Scale. Patients will also discuss reasons for non-hunger eating and learn strategies to use for controlling emotional eating.  Targeting Your Nutrition Priorities Clinical staff led group instruction and group discussion with PowerPoint presentation and patient guidebook. To enhance the learning environment the use of posters, models and videos may be added. Patients will learn how to determine their genetic susceptibility to disease by reviewing their family history. Patients will gain insight into the importance of diet as part of an overall healthy lifestyle in mitigating the impact of genetics and other environmental insults. The purpose of this lesson is to provide patients with the opportunity to assess their personal nutrition priorities by looking at their family history, their own health history and current risk factors. Patients will also be able to discuss ways of prioritizing and modifying the Pritikin Eating Plan for their highest risk areas  Menu  Clinical staff led group instruction and group discussion with PowerPoint presentation and patient guidebook. To enhance the learning environment the use of posters, models and videos may be added. Using menus brought in from [employer],  or printed from [employer], patients will apply the Pritikin dining out guidelines that were presented in the [employer] video. Patients will also be able to practice these guidelines in a variety of provided scenarios. The purpose of this lesson is to provide patients with the opportunity to practice hands-on learning of the Pritikin Dining Out guidelines with actual menus and practice scenarios.  Label Reading Clinical staff led group instruction and group discussion with PowerPoint presentation and patient guidebook. To enhance the learning environment the use of posters, models and videos may be added. Patients will review and discuss the Pritikin label reading guidelines presented in Pritikin's Label Reading Educational series video. Using fool labels brought in from local grocery stores and markets, patients will apply the label reading guidelines and determine if the packaged food meet the Pritikin guidelines. The purpose of this lesson is to provide patients with the opportunity to review, discuss, and practice hands-on learning of the Pritikin Label Reading guidelines with actual  packaged food labels. Cooking School  Pritikin's [employer] are designed to teach patients ways to prepare quick, simple, and affordable recipes at home. The importance of nutrition's role in chronic disease risk reduction is reflected in its emphasis in the overall Pritikin program. By learning how to prepare essential core Pritikin Eating Plan recipes, patients will increase control over what they eat; be able to customize the flavor of foods without the use of added salt, sugar, or fat; and improve the quality of the food they consume. By learning a set of core recipes which are easily assembled, quickly prepared, and affordable, patients are more likely to prepare more healthy foods at home. These workshops focus on convenient breakfasts, simple entres, side dishes, and desserts which  can be prepared with minimal effort and are consistent with nutrition recommendations for cardiovascular risk reduction. Cooking [employer] are taught by a Armed forces logistics/support/administrative officer (RD) who has been trained by the [employer]. The chef or RD has a clear understanding of the importance of minimizing - if not completely eliminating - added fat, sugar, and sodium in recipes. Throughout the series of Cooking School Workshop sessions, patients will learn about healthy ingredients and efficient methods of cooking to build confidence in their capability to prepare    Cooking School weekly topics:  Adding Flavor- Sodium-Free  Fast and Healthy Breakfasts  Powerhouse Plant-Based Proteins  Satisfying Salads and Dressings  Simple Sides and Sauces  International Cuisine-Spotlight on the [employer] Zones  Delicious Desserts  Savory Soups  [employer] - Meals in a Astronomer Appetizers and Snacks  Comforting Weekend Breakfasts  One-Pot Wonders   Fast Evening Meals  Landscape architect Your Pritikin Plate  WORKSHOPS   Healthy Mindset (Psychosocial):  Focused Goals, Sustainable Changes Clinical staff led group instruction and group discussion with PowerPoint presentation and patient guidebook. To enhance the learning environment the use of posters, models and videos may be added. Patients will be able to apply effective goal setting strategies to establish at least one personal goal, and then take consistent, meaningful action toward that goal. They will learn to identify common barriers to achieving personal goals and develop strategies to overcome them. Patients will also gain an understanding of how our mind-set can impact our ability to achieve goals and the importance of cultivating a positive and growth-oriented mind-set. The purpose of this lesson is to provide patients with a deeper understanding of how to set and achieve personal goals, as well as the tools  and strategies needed to overcome common obstacles which may arise along the way.  From Head to Heart: The Power of a Healthy Outlook  Clinical staff led group instruction and group discussion with PowerPoint presentation and patient guidebook. To enhance the learning environment the use of posters, models and videos may be added. Patients will be able to recognize and describe the impact of emotions and mood on physical health. They will discover the importance of self-care and explore self-care practices which may work for them. Patients will also learn how to utilize the 4 C's to cultivate a healthier outlook and better manage stress and challenges. The purpose of this lesson is to demonstrate to patients how a healthy outlook is an essential part of maintaining good health, especially as they continue their cardiac rehab journey.  Healthy Sleep for a Healthy Heart Clinical staff led group instruction and group discussion with PowerPoint presentation and patient guidebook. To enhance the learning environment the  use of posters, models and videos may be added. At the conclusion of this workshop, patients will be able to demonstrate knowledge of the importance of sleep to overall health, well-being, and quality of life. They will understand the symptoms of, and treatments for, common sleep disorders. Patients will also be able to identify daytime and nighttime behaviors which impact sleep, and they will be able to apply these tools to help manage sleep-related challenges. The purpose of this lesson is to provide patients with a general overview of sleep and outline the importance of quality sleep. Patients will learn about a few of the most common sleep disorders. Patients will also be introduced to the concept of "sleep hygiene," and discover ways to self-manage certain sleeping problems through simple daily behavior changes. Finally, the workshop will motivate patients by clarifying the links between  quality sleep and their goals of heart-healthy living.   Recognizing and Reducing Stress Clinical staff led group instruction and group discussion with PowerPoint presentation and patient guidebook. To enhance the learning environment the use of posters, models and videos may be added. At the conclusion of this workshop, patients will be able to understand the types of stress reactions, differentiate between acute and chronic stress, and recognize the impact that chronic stress has on their health. They will also be able to apply different coping mechanisms, such as reframing negative self-talk. Patients will have the opportunity to practice a variety of stress management techniques, such as deep abdominal breathing, progressive muscle relaxation, and/or guided imagery.  The purpose of this lesson is to educate patients on the role of stress in their lives and to provide healthy techniques for coping with it.  Learning Barriers/Preferences:  Learning Barriers/Preferences - 05/13/23 1004       Learning Barriers/Preferences   Learning Barriers None    Learning Preferences Audio;Computer/Internet;Group Instruction;Individual Instruction;Pictoral;Skilled Demonstration;Video;Written Material;Verbal Instruction             Education Topics:  Knowledge Questionnaire Score:  Knowledge Questionnaire Score - 05/13/23 0938       Knowledge Questionnaire Score   Pre Score 19/24             Core Components/Risk Factors/Patient Goals at Admission:  Personal Goals and Risk Factors at Admission - 05/13/23 0755       Core Components/Risk Factors/Patient Goals on Admission    Weight Management Yes    Intervention Weight Management: Develop a combined nutrition and exercise program designed to reach desired caloric intake, while maintaining appropriate intake of nutrient and fiber, sodium and fats, and appropriate energy expenditure required for the weight goal.;Weight Management: Provide  education and appropriate resources to help participant work on and attain dietary goals.    Expected Outcomes Short Term: Continue to assess and modify interventions until short term weight is achieved;Long Term: Adherence to nutrition and physical activity/exercise program aimed toward attainment of established weight goal;Weight Loss: Understanding of general recommendations for a balanced deficit meal plan, which promotes 1-2 lb weight loss per week and includes a negative energy balance of [phone] kcal/d;Understanding recommendations for meals to include 15-35% energy as protein, 25-35% energy from fat, 35-60% energy from carbohydrates, less than 200mg  of dietary cholesterol, 20-35 gm of total fiber daily;Understanding of distribution of calorie intake throughout the day with the consumption of 4-5 meals/snacks    Diabetes Yes    Intervention Provide education about signs/symptoms and action to take for hypo/hyperglycemia.;Provide education about proper nutrition, including hydration, and aerobic/resistive exercise prescription along with prescribed medications  to achieve blood glucose in normal ranges: Fasting glucose 65-99 mg/dL    Expected Outcomes Long Term: Attainment of HbA1C < 7%.;Short Term: Participant verbalizes understanding of the signs/symptoms and immediate care of hyper/hypoglycemia, proper foot care and importance of medication, aerobic/resistive exercise and nutrition plan for blood glucose control.    Hypertension Yes    Intervention Provide education on lifestyle modifcations including regular physical activity/exercise, weight management, moderate sodium restriction and increased consumption of fresh fruit, vegetables, and low fat dairy, alcohol moderation, and smoking cessation.;Monitor prescription use compliance.    Expected Outcomes Short Term: Continued assessment and intervention until BP is < 140/84mm HG in hypertensive participants. < 130/38mm HG in hypertensive participants  with diabetes, heart failure or chronic kidney disease.;Long Term: Maintenance of blood pressure at goal levels.    Lipids Yes    Intervention Provide education and support for participant on nutrition & aerobic/resistive exercise along with prescribed medications to achieve LDL 70mg , HDL >40mg .    Expected Outcomes Short Term: Participant states understanding of desired cholesterol values and is compliant with medications prescribed. Participant is following exercise prescription and nutrition guidelines.;Long Term: Cholesterol controlled with medications as prescribed, with individualized exercise RX and with personalized nutrition plan. Value goals: LDL < 70mg , HDL > 40 mg.    Stress Yes    Intervention Offer individual and/or small group education and counseling on adjustment to heart disease, stress management and health-related lifestyle change. Teach and support self-help strategies.;Refer participants experiencing significant psychosocial distress to appropriate mental health specialists for further evaluation and treatment. When possible, include family members and significant others in education/counseling sessions.    Expected Outcomes Short Term: Participant demonstrates changes in health-related behavior, relaxation and other stress management skills, ability to obtain effective social support, and compliance with psychotropic medications if prescribed.;Long Term: Emotional wellbeing is indicated by absence of clinically significant psychosocial distress or social isolation.             Core Components/Risk Factors/Patient Goals Review:   Goals and Risk Factor Review     Row Name 06/01/23 1057             Core Components/Risk Factors/Patient Goals Review   Personal Goals Review Other       Review Pateitn stated she wants to work on her balance. Gave patient a pamphlet on Balance & Vestibular Rehab.       Expected Outcomes Patient's balance will improve.                 Core Components/Risk Factors/Patient Goals at Discharge (Final Review):   Goals and Risk Factor Review - 06/01/23 1057       Core Components/Risk Factors/Patient Goals Review   Personal Goals Review Other    Review Pateitn stated she wants to work on her balance. Gave patient a pamphlet on Balance & Vestibular Rehab.    Expected Outcomes Patient's balance will improve.             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 Introduction to pritikin education program/ intensive cardiac rehab. Initial orientation packet reviewed with patient. Patient will continue to participate in ICR for exercse, nutrition, and lifestyle modification.               Comments: See ITP comments.

## 2023-06-02 ENCOUNTER — Encounter (HOSPITAL_COMMUNITY)
Admission: RE | Admit: 2023-06-02 | Discharge: 2023-06-02 | Disposition: A | Discharge status: Home / Self Care | Payer: Medicare Other | Source: Ambulatory Visit | Attending: Cardiovascular Disease | Admitting: Cardiovascular Disease

## 2023-06-02 DIAGNOSIS — Z955 Presence of coronary angioplasty implant and graft: Secondary | ICD-10-CM

## 2023-06-04 ENCOUNTER — Encounter (HOSPITAL_COMMUNITY)
Admission: RE | Admit: 2023-06-04 | Discharge: 2023-06-04 | Disposition: A | Discharge status: Home / Self Care | Payer: Medicare Other | Source: Ambulatory Visit | Attending: Cardiovascular Disease | Admitting: Cardiovascular Disease

## 2023-06-04 DIAGNOSIS — Z955 Presence of coronary angioplasty implant and graft: Secondary | ICD-10-CM

## 2023-06-07 ENCOUNTER — Encounter (HOSPITAL_COMMUNITY)
Admission: RE | Admit: 2023-06-07 | Discharge: 2023-06-07 | Disposition: A | Discharge status: Home / Self Care | Payer: Medicare Other | Source: Ambulatory Visit | Attending: Cardiovascular Disease | Admitting: Cardiovascular Disease

## 2023-06-07 DIAGNOSIS — Z955 Presence of coronary angioplasty implant and graft: Secondary | ICD-10-CM

## 2023-06-09 ENCOUNTER — Encounter (HOSPITAL_COMMUNITY)
Admission: RE | Admit: 2023-06-09 | Discharge: 2023-06-09 | Disposition: A | Discharge status: Home / Self Care | Payer: Medicare Other | Source: Ambulatory Visit | Attending: Cardiovascular Disease | Admitting: Cardiovascular Disease

## 2023-06-09 DIAGNOSIS — Z955 Presence of coronary angioplasty implant and graft: Secondary | ICD-10-CM | POA: Diagnosis not present

## 2023-06-11 ENCOUNTER — Encounter (HOSPITAL_COMMUNITY)
Admission: RE | Admit: 2023-06-11 | Discharge: 2023-06-11 | Disposition: A | Discharge status: Home / Self Care | Payer: Medicare Other | Source: Ambulatory Visit | Attending: Cardiovascular Disease | Admitting: Cardiovascular Disease

## 2023-06-11 DIAGNOSIS — Z955 Presence of coronary angioplasty implant and graft: Secondary | ICD-10-CM

## 2023-06-14 ENCOUNTER — Encounter (HOSPITAL_COMMUNITY)
Admission: RE | Admit: 2023-06-14 | Discharge: 2023-06-14 | Disposition: A | Discharge status: Home / Self Care | Payer: Medicare Other | Source: Ambulatory Visit | Attending: Cardiovascular Disease | Admitting: Cardiovascular Disease

## 2023-06-14 DIAGNOSIS — Z955 Presence of coronary angioplasty implant and graft: Secondary | ICD-10-CM | POA: Diagnosis not present

## 2023-06-16 ENCOUNTER — Encounter (HOSPITAL_COMMUNITY): Payer: Medicare Other

## 2023-06-18 ENCOUNTER — Encounter (HOSPITAL_COMMUNITY)
Admission: RE | Admit: 2023-06-18 | Discharge: 2023-06-18 | Disposition: A | Discharge status: Home / Self Care | Payer: Medicare Other | Source: Ambulatory Visit | Attending: Cardiovascular Disease | Admitting: Cardiovascular Disease

## 2023-06-18 DIAGNOSIS — Z955 Presence of coronary angioplasty implant and graft: Secondary | ICD-10-CM | POA: Diagnosis not present

## 2023-06-18 NOTE — Progress Notes (Signed)
Reviewed home exercise Rx with patient today.  Encouraged warm-up, cool-down, and stretching. Reviewed THRR of  57 -114 and keeping RPE between 11-13. Encouraged to hydrate with activity.  Reviewed weather parameters for temperature and humidity for safe exercise outdoors. Reviewed S/S to terminate exercise and when to call 911 vs MD. Pt encouraged to always carry a cell phone for safety when exercising outdoors. Pt verbalized understanding of the home exercise Rx and was provided a copy.   Lorin Picket MS, ACSM-CEP, CCRP

## 2023-06-21 ENCOUNTER — Encounter (HOSPITAL_COMMUNITY)
Admission: RE | Admit: 2023-06-21 | Discharge: 2023-06-21 | Disposition: A | Discharge status: Home / Self Care | Payer: Medicare Other | Source: Ambulatory Visit | Attending: Cardiovascular Disease | Admitting: Cardiovascular Disease

## 2023-06-21 DIAGNOSIS — Z955 Presence of coronary angioplasty implant and graft: Secondary | ICD-10-CM | POA: Diagnosis not present

## 2023-06-23 ENCOUNTER — Encounter (HOSPITAL_COMMUNITY)
Admission: RE | Admit: 2023-06-23 | Discharge: 2023-06-23 | Disposition: A | Discharge status: Home / Self Care | Payer: Medicare Other | Source: Ambulatory Visit | Attending: Cardiovascular Disease | Admitting: Cardiovascular Disease

## 2023-06-23 DIAGNOSIS — Z955 Presence of coronary angioplasty implant and graft: Secondary | ICD-10-CM

## 2023-06-25 ENCOUNTER — Encounter (HOSPITAL_COMMUNITY)
Admission: RE | Admit: 2023-06-25 | Discharge: 2023-06-25 | Disposition: A | Discharge status: Home / Self Care | Payer: Medicare Other | Source: Ambulatory Visit | Attending: Cardiovascular Disease | Admitting: Cardiovascular Disease

## 2023-06-25 DIAGNOSIS — Z955 Presence of coronary angioplasty implant and graft: Secondary | ICD-10-CM

## 2023-06-28 ENCOUNTER — Encounter (HOSPITAL_COMMUNITY): Payer: Medicare Other

## 2023-06-29 NOTE — Progress Notes (Signed)
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 Use 1 lancet bid, Disp: , Rfl:    ACCU-CHEK SMARTVIEW test strip, 1 strip by Other route 2 (two) times daily. Use 1 strip to check glucose twice a day, Disp: , Rfl:    albuterol (VENTOLIN HFA) 108 (90 Base) MCG/ACT inhaler, Inhale 2 puffs into the lungs every 6 (six) hours as needed for wheezing or shortness of breath (Cough)., Disp: 18 g, Rfl: 2   amLODipine (NORVASC) 5 MG tablet, Take 5 mg by mouth in the morning., Disp: , Rfl:    apixaban (ELIQUIS) 5 MG TABS tablet, Take 1 tablet (5 mg total) by mouth 2 (two) times daily., Disp: 180 tablet, Rfl: 3   Cholecalciferol (VITAMIN D3) 1000 UNITS CAPS, Take 1,000 Units by mouth in the morning., Disp: , Rfl:    CLENPIQ 10-3.5-12 MG-GM -GM/175ML SOLN, 175 mL orally twice for 2, Disp: , Rfl:    clopidogrel (PLAVIX) 75 MG tablet, Take 1 tablet (75 mg total) by mouth daily., Disp: 30 tablet, Rfl: 2   hydrochlorothiazide (HYDRODIURIL) 25 MG tablet, Take 25 mg by mouth in the morning., Disp: , Rfl:    latanoprost (XALATAN) 0.005 % ophthalmic solution, Place 1 drop into both eyes at bedtime., Disp: , Rfl:    levothyroxine (SYNTHROID, LEVOTHROID) 125 MCG tablet, Take 125 mcg by mouth daily before breakfast., Disp: , Rfl:     losartan (COZAAR) 100 MG tablet, Take 1 tablet (100 mg total) by mouth daily., Disp: 90 tablet, Rfl: 3   metFORMIN (GLUCOPHAGE) 1000 MG tablet, Take 1,000 mg by mouth 2 (two) times daily., Disp: , Rfl:    pantoprazole (PROTONIX) 40 MG tablet, TAKE 1 TABLET BY MOUTH EVERY DAY, Disp: 90 tablet, Rfl: 1   rosuvastatin (CRESTOR) 5 MG tablet, Take 5 mg by mouth 2 (two) times a week. Tuesdays & Thursdays., Disp: , Rfl:   Past Medical History: Past Medical History:  Diagnosis Date   Arthritis    Atrial fibrillation with RVR (HCC) 09/21/2014   Bronchitis    h/o   Diabetes mellitus    NO MEDS,  DIET CONTROLLED   History of migraines    "when working under alot of stress"   Hypertension    Hypothyroid    LBBB (left bundle branch block) 09/22/2014   OSA (obstructive sleep apnea) 04/09/2015   Moderate OSA with AHI 16.7 events per hour with successful CPAP titration to 8cm H2O   PONV (postoperative nausea and vomiting)    PT DOESNOT WANT TO TAKE HER ATENOLOL     DOS.   " IT DROPS MY BP TO LOW"    Tobacco Use: Social History   Tobacco Use  Smoking Status Never  Smokeless Tobacco Never    Labs: Review Flowsheet       Latest Ref Rng &  Units 04/01/2010 04/02/2010 09/21/2014 09/22/2014  Labs for ITP Cardiac and Pulmonary Rehab  Cholestrol 0 - 200 mg/dL - 308        ATP III CLASSIFICATION:  <200     mg/dL   Desirable  657-846  mg/dL   Borderline High  >=962    mg/dL   High         - 952   LDL (calc) 0 - 99 mg/dL - 87        Total Cholesterol/HDL:CHD Risk Coronary Heart Disease Risk Table                     Men   Women  1/2 Average Risk   3.4   3.3  Average Risk       5.0   4.4  2 X Average Risk   9.6   7.1  3 X Average Risk  23.4   11.0        Use the calculated Patient Ratio above and the CHD Risk Table to determine the patient's CHD Risk.        ATP III CLASSIFICATION (LDL):  <100     mg/dL   Optimal  841-324  mg/dL   Near or Above                    Optimal  130-159  mg/dL    Borderline  401-027  mg/dL   High  >253     mg/dL   Very High  - 91   HDL-C >39 mg/dL - 36  - 57   Trlycerides <150 mg/dL - 99  - 664   Hemoglobin A1c <5.7 % 9.1 (NOTE) The ADA recommends the following therapeutic goal for glycemic control related to Hgb A1c measurement: Goal of therapy: <6.5 Hgb A1c  Reference: American Diabetes Association: Clinical Practice Recommendations 2010, Diabetes Care, 2010, 33: (Suppl  1).  - 8.1  -  TCO2 0 - 100 mmol/L 26  - - -    Capillary Blood Glucose: Lab Results  Component Value Date   GLUCAP 96 05/19/2023   GLUCAP 107 (H) 05/19/2023   GLUCAP 95 05/17/2023   GLUCAP 111 (H) 05/17/2023   GLUCAP 147 (H) 04/23/2023     Exercise Target Goals: Exercise Program Goal: Individual exercise prescription set using results from initial 6 min walk test and THRR while considering  patient's activity barriers and safety.   Exercise Prescription Goal: Initial exercise prescription builds to 30-45 minutes a day of aerobic activity, 2-3 days per week.  Home exercise guidelines will be given to patient during program as part of exercise prescription that the participant will acknowledge.  Activity Barriers & Risk Stratification:  Activity Barriers & Cardiac Risk Stratification - 05/13/23 1001       Activity Barriers & Cardiac Risk Stratification   Activity Barriers Deconditioning;Shortness of Breath;Balance Concerns;History of Falls;Right Knee Replacement;Arthritis;Back Problems;Assistive Device    Cardiac Risk Stratification High   <5 METs on            6 Minute Walk:  6 Minute Walk     Row Name 05/13/23 0800         6 Minute Walk   Phase Initial     Distance 874 feet  Used Cane     Walk Time 6 minutes     # of Rest Breaks 1  3:00-4:10, stopped due to HR 130. resumed once within target     MPH 1.7  METS 2.1     RPE 13     Perceived Dyspnea  1.5     VO2 Peak 7.23     Symptoms Yes (comment)     Comments SOB, RPD = 1.5. Resolved with  rest. No pain     Resting HR 78 bpm     Resting BP 122/84     Resting Oxygen Saturation  98 %     Exercise Oxygen Saturation  during 6 min walk 100 %     Max Ex. HR 137 bpm     Max Ex. BP 156/76     2 Minute Post BP 128/74              Oxygen Initial Assessment:   Oxygen Re-Evaluation:   Oxygen Discharge (Final Oxygen Re-Evaluation):   Initial Exercise Prescription:  Initial Exercise Prescription - 05/13/23 1000       Date of Initial Exercise RX and Referring Provider   Date 05/13/23    Referring Provider Sherryl Manges, MD    Expected Discharge Date 07/23/23      NuStep   Level 1    SPM 85    Minutes 15    METs 2      Prescription Details   Frequency (times per week) 3    Duration Progress to 30 minutes of continuous aerobic without signs/symptoms of physical distress      Intensity   THRR 40-80% of Max Heartrate 57-114    Ratings of Perceived Exertion 11-13    Perceived Dyspnea 0-4      Progression   Progression Continue progressive overload as per policy without signs/symptoms or physical distress.      Resistance Training   Training Prescription Yes    Weight 2    Reps 10-15             Perform Capillary Blood Glucose checks as needed.  Exercise Prescription Changes:   Exercise Prescription Changes     Row Name 05/17/23 1600 05/31/23 1030 06/18/23 1400         Response to Exercise   Blood Pressure (Admit) 140/74 120/70 130/62     Blood Pressure (Exercise) 138/74 168/72 158/62     Blood Pressure (Exit) 114/74 128/64 124/70     Heart Rate (Admit) 70 bpm 65 bpm 62 bpm     Heart Rate (Exercise) 98 bpm 106 bpm 108 bpm     Heart Rate (Exit) 67 bpm 67 bpm 67 bpm     Rating of Perceived Exertion (Exercise) 9 11.5 11     Symptoms None None None     Comments OPt's first day in the CRP2 program Reviewed METs Reviewed METs, goals, home exercise Rx     Duration Continue with 30 min of aerobic exercise without signs/symptoms of physical distress.  Continue with 30 min of aerobic exercise without signs/symptoms of physical distress. Continue with 30 min of aerobic exercise without signs/symptoms of physical distress.     Intensity THRR unchanged THRR unchanged THRR unchanged       Progression   Progression Continue to progress workloads to maintain intensity without signs/symptoms of physical distress. Continue to progress workloads to maintain intensity without signs/symptoms of physical distress. Continue to progress workloads to maintain intensity without signs/symptoms of physical distress.     Average METs 1.7 1.9 2.5       Resistance Training   Training Prescription Yes Yes Yes     Weight 2 2 2  lbs  Reps 10-15 10-15 10-15     Time 10 Minutes 10 Minutes 10 Minutes       Interval Training   Interval Training No No No       NuStep   Level 1 1 4      SPM 81 121 125     Minutes 15 30 30      METs 1.7 1.9 2.5       Home Exercise Plan   Plans to continue exercise at -- -- [employer] (comment)     Frequency -- -- Add 2 additional days to program exercise sessions.     Initial Home Exercises Provided -- -- 06/18/23              Exercise Comments:   Exercise Comments     Row Name 05/17/23 1622 05/31/23 1030 06/18/23 1420       Exercise Comments Pt's firs day in the cRP2 program. Pt is off to a good start. No complaints today. Reviewed METs. Pt is making good progress. Reviewed METs, goals and home exercise Rx. Pt voices she has access to [employer]. Encouraged patient to get signed up at the [facility] for the program and start 2x/week using the nustep for 30 minutes. Pt agrees to begin this process. Pt verbalized undersntading of the home exercise Rx and was provided a copy.              Exercise Goals and Review:   Exercise Goals     Row Name 05/13/23 0754             Exercise Goals   Increase Physical Activity Yes       Intervention Provide advice, education, support and counseling about  physical activity/exercise needs.;Develop an individualized exercise prescription for aerobic and resistive training based on initial evaluation findings, risk stratification, comorbidities and participant's personal goals.       Expected Outcomes Short Term: Attend rehab on a regular basis to increase amount of physical activity.;Long Term: Exercising regularly at least 3-5 days a week.;Long Term: Add in home exercise to make exercise part of routine and to increase amount of physical activity.       Increase Strength and Stamina Yes       Intervention Provide advice, education, support and counseling about physical activity/exercise needs.;Develop an individualized exercise prescription for aerobic and resistive training based on initial evaluation findings, risk stratification, comorbidities and participant's personal goals.       Expected Outcomes Short Term: Increase workloads from initial exercise prescription for resistance, speed, and METs.;Short Term: Perform resistance training exercises routinely during rehab and add in resistance training at home;Long Term: Improve cardiorespiratory fitness, muscular endurance and strength as measured by increased METs and functional capacity ( )       Able to understand and use rate of perceived exertion (RPE) scale Yes       Intervention Provide education and explanation on how to use RPE scale       Expected Outcomes Short Term: Able to use RPE daily in rehab to express subjective intensity level;Long Term:  Able to use RPE to guide intensity level when exercising independently       Able to understand and use Dyspnea scale Yes       Intervention Provide education and explanation on how to use Dyspnea scale       Expected Outcomes Short Term: Able to use Dyspnea scale daily in rehab to express subjective sense of shortness of breath during exertion;Long  Term: Able to use Dyspnea scale to guide intensity level when exercising independently       Knowledge  and understanding of Target Heart Rate Range (THRR) Yes       Intervention Provide education and explanation of THRR including how the numbers were predicted and where they are located for reference       Expected Outcomes Short Term: Able to state/look up THRR;Short Term: Able to use daily as guideline for intensity in rehab;Long Term: Able to use THRR to govern intensity when exercising independently       Understanding of Exercise Prescription Yes       Intervention Provide education, explanation, and written materials on patient's individual exercise prescription       Expected Outcomes Short Term: Able to explain program exercise prescription;Long Term: Able to explain home exercise prescription to exercise independently                Exercise Goals Re-Evaluation :  Exercise Goals Re-Evaluation     Row Name 05/17/23 1621 06/18/23 1417           Exercise Goal Re-Evaluation   Exercise Goals Review Increase Physical Activity;Increase Strength and Stamina;Able to understand and use rate of perceived exertion (RPE) scale;Knowledge and understanding of Target Heart Rate Range (THRR);Understanding of Exercise Prescription Increase Physical Activity;Increase Strength and Stamina;Able to understand and use rate of perceived exertion (RPE) scale;Knowledge and understanding of Target Heart Rate Range (THRR);Understanding of Exercise Prescription      Comments Pt's first day in the cRP2 program. Pt understands the exercise Rx, RPE scale and THRR. Reviewed METs, goals and home exercise Rx. Pt is making good progress with peak METs of 2.5. In regards to pt goals: Pt vocies improvement in strength and stamina; is moving better but is still bothered by her knee; she voices less SOB with activity; voices thst she needs to use the chair less during warm-up/cool-down and weights; voices she is able to do more syuff around the house again.      Expected Outcomes Will continue to monitor patient and  progress exercise workloads as tolerated. Will continue to monitor patient and progress exercise workloads as tolerated.               Discharge Exercise Prescription (Final Exercise Prescription Changes):  Exercise Prescription Changes - 06/18/23 1400       Response to Exercise   Blood Pressure (Admit) 130/62    Blood Pressure (Exercise) 158/62    Blood Pressure (Exit) 124/70    Heart Rate (Admit) 62 bpm    Heart Rate (Exercise) 108 bpm    Heart Rate (Exit) 67 bpm    Rating of Perceived Exertion (Exercise) 11    Symptoms None    Comments Reviewed METs, goals, home exercise Rx    Duration Continue with 30 min of aerobic exercise without signs/symptoms of physical distress.    Intensity THRR unchanged      Progression   Progression Continue to progress workloads to maintain intensity without signs/symptoms of physical distress.    Average METs 2.5      Resistance Training   Training Prescription Yes    Weight 2 lbs    Reps 10-15    Time 10 Minutes      Interval Training   Interval Training No      NuStep   Level 4    SPM 125    Minutes 30    METs 2.5  Home Exercise Plan   Plans to continue exercise at [facility] (comment)    Frequency Add 2 additional days to program exercise sessions.    Initial Home Exercises Provided 06/18/23             Nutrition:  Target Goals: Understanding of nutrition guidelines, daily intake of sodium 1500mg , cholesterol 200mg , calories 30% from fat and 7% or less from saturated fats, daily to have 5 or more servings of fruits and vegetables.  Biometrics:  Pre Biometrics - 05/13/23 0753       Pre Biometrics   Waist Circumference 46.25 inches    Hip Circumference 50.5 inches    Waist to Hip Ratio 0.92 %    Triceps Skinfold 35 mm    % Body Fat 47 %    Grip Strength 16 kg    Flexibility 10.5 in    Single Leg Stand 3.5 seconds              Nutrition Therapy Plan and Nutrition Goals:  Nutrition Therapy &  Goals - 06/17/23 1503       Nutrition Therapy   Diet Heart healthy diet    Drug/Food Interactions Statins/Certain Fruits      Personal Nutrition Goals   Nutrition Goal Patient to identify strategies for reducing cardiovascular risk by attending the [employer] education and nutrition series weekly.    Personal Goal #2 Patient to improve diet quality by using the plate method as a guide for meal planning to include lean protein/plant protein, fruits, vegetables, whole grains, nonfat dairy as part of a well-balanced diet.    Comments Goals in progress. Destiny Brown continues to attend the [employer] and nutrition series regularly. She is down 2.6# since starting with our program. Patient will benefit from participation in intensive cardiac rehab for nutrition, exercise, and lifestyle modification.      Intervention Plan   Intervention Prescribe, educate and counsel regarding individualized specific dietary modifications aiming towards targeted core components such as weight, hypertension, lipid management, diabetes, heart failure and other comorbidities.;Nutrition handout(s) given to patient.    Expected Outcomes Short Term Goal: Understand basic principles of dietary content, such as calories, fat, sodium, cholesterol and nutrients.;Long Term Goal: Adherence to prescribed nutrition plan.             Nutrition Assessments:  Nutrition Assessments - 05/17/23 1144       Rate Your Plate Scores   Pre Score 63            MEDIFICTS Score Key: ?70 Need to make dietary changes  40-70 Heart Healthy Diet ? 40 Therapeutic Level Cholesterol Diet   Flowsheet Row INTENSIVE CARDIAC REHAB from 05/17/2023 in [facility] Heart, Vascular, & Lung Health  Picture Your Plate Total Score on Admission 63      Picture Your Plate Scores: <57 Unhealthy dietary pattern with much room for improvement. 41-50 Dietary pattern unlikely to meet recommendations for good health and  room for improvement. 51-60 More healthful dietary pattern, with some room for improvement.  >60 Healthy dietary pattern, although there may be some specific behaviors that could be improved.    Nutrition Goals Re-Evaluation:  Nutrition Goals Re-Evaluation     Row Name 05/17/23 0938 06/17/23 1503           Goals   Current Weight 191 lb 2.2 oz (86.7 kg) 188 lb 7.9 oz (85.5 kg)      Comment A1c 5.9, lipids WNL no new labs; most  recent labs A1c 5.9, lipids WNL      Expected Outcome Destiny Brown's blood sugar is well controlled with diet and metformin. Patient will benefit from participation in intensive cardiac rehab for nutrition, exercise, and lifestyle modification. Goals in progress. Destiny Brown continues to attend the [employer] and nutrition series regularly. She is down 2.6# since starting with our program. Patient will benefit from participation in intensive cardiac rehab for nutrition, exercise, and lifestyle modification.               Nutrition Goals Re-Evaluation:  Nutrition Goals Re-Evaluation     Row Name 05/17/23 0938 06/17/23 1503           Goals   Current Weight 191 lb 2.2 oz (86.7 kg) 188 lb 7.9 oz (85.5 kg)      Comment A1c 5.9, lipids WNL no new labs; most recent labs A1c 5.9, lipids WNL      Expected Outcome Destiny Brown's blood sugar is well controlled with diet and metformin. Patient will benefit from participation in intensive cardiac rehab for nutrition, exercise, and lifestyle modification. Goals in progress. Destiny Brown continues to attend the [employer] and nutrition series regularly. She is down 2.6# since starting with our program. Patient will benefit from participation in intensive cardiac rehab for nutrition, exercise, and lifestyle modification.               Nutrition Goals Discharge (Final Nutrition Goals Re-Evaluation):  Nutrition Goals Re-Evaluation - 06/17/23 1503       Goals   Current Weight 188 lb 7.9 oz (85.5 kg)    Comment no new  labs; most recent labs A1c 5.9, lipids WNL    Expected Outcome Goals in progress. Destiny Brown continues to attend the [employer] and nutrition series regularly. She is down 2.6# since starting with our program. Patient will benefit from participation in intensive cardiac rehab for nutrition, exercise, and lifestyle modification.             Psychosocial: Target Goals: Acknowledge presence or absence of significant depression and/or stress, maximize coping skills, provide positive support system. Participant is able to verbalize types and ability to use techniques and skills needed for reducing stress and depression.  Initial Review & Psychosocial Screening:  Initial Psych Review & Screening - 06/01/23 1053       Initial Review   Current issues with Current Stress Concerns    Source of Stress Concerns Family    Comments Destiny Brown and her husband help take care of her tow granchildren as her daughter has bipolar disorder. Destiny Brown's granson is Autistic he is able to to live independantly with supervision      Family Dynamics   Good Support System? Yes   Destiny Brown has her husband and son who lives in [place] for support     Barriers   Psychosocial barriers to participate in program The patient should benefit from training in stress management and relaxation.      Screening Interventions   Interventions To provide support and resources with identified psychosocial needs    Expected Outcomes Long Term Goal: Stressors or current issues are controlled or eliminated.             Quality of Life Scores:  Quality of Life - 05/13/23 0938       Quality of Life   Select Quality of Life      Quality of Life Scores   Health/Function Pre 22.7 %    Socioeconomic Pre 25.07 %    Psych/Spiritual  Pre 24.86 %    Family Pre 22.5 %    GLOBAL Pre 23.6 %            Scores of 19 and below usually indicate a poorer quality of life in these areas.  A difference of  2-3 points is a  clinically meaningful difference.  A difference of 2-3 points in the total score of the Quality of Life Index has been associated with significant improvement in overall quality of life, self-image, physical symptoms, and general health in studies assessing change in quality of life.  PHQ-9: Review Flowsheet       05/13/2023  Depression screen PHQ 2/9  Decreased Interest 0  Down, Depressed, Hopeless 0  PHQ - 2 Score 0  Altered sleeping 0  Tired, decreased energy 0  Change in appetite 0  Feeling bad or failure about yourself  0  Trouble concentrating 0  Moving slowly or fidgety/restless 0  Suicidal thoughts 0  PHQ-9 Score 0   Interpretation of Total Score  Total Score Depression Severity:  1-4 = Minimal depression, 5-9 = Mild depression, 10-14 = Moderate depression, 15-19 = Moderately severe depression, 20-27 = Severe depression   Psychosocial Evaluation and Intervention:   Psychosocial Re-Evaluation:  Psychosocial Re-Evaluation     Row Name 06/01/23 1120 06/29/23 1747           Psychosocial Re-Evaluation   Current issues with Current Stress Concerns Current Stress Concerns      Comments Destiny Brown has not voiced any further concerns or stressors. Destiny Brown partcipates in the  care of her two grandsons. Destiny Brown's granchildren are adults and are self sufficient. Destiny Brown says this sometimes is a stressor for her      Interventions Encouraged to attend Cardiac Rehabilitation for the exercise;Stress management education Encouraged to attend Cardiac Rehabilitation for the exercise;Stress management education      Continue Psychosocial Services  Follow up required by staff No Follow up required        Initial Review   Source of Stress Concerns Family Family      Comments Will coninute to moniotr and offer support as needed. Will coninute to moniotr and offer support as needed.               Psychosocial Discharge (Final Psychosocial Re-Evaluation):  Psychosocial Re-Evaluation -  06/29/23 1747       Psychosocial Re-Evaluation   Current issues with Current Stress Concerns    Comments Destiny Brown partcipates in the  care of her two grandsons. Destiny Brown's granchildren are adults and are self sufficient. Destiny Brown says this sometimes is a stressor for her    Interventions Encouraged to attend Cardiac Rehabilitation for the exercise;Stress management education    Continue Psychosocial Services  No Follow up required      Initial Review   Source of Stress Concerns Family    Comments Will coninute to moniotr and offer support as needed.             Vocational Rehabilitation: Provide vocational rehab assistance to qualifying candidates.   Vocational Rehab Evaluation & Intervention:  Vocational Rehab - 05/13/23 0847       Initial Vocational Rehab Evaluation & Intervention   Assessment shows need for Vocational Rehabilitation No   Destiny Brown works full time and does not need vocational rehab at this time            Education: Education Goals: Education classes will be provided on a weekly basis, covering required topics. Participant will state understanding/return  demonstration of topics presented.    Education     Row Name 05/17/23 1000     Education   Cardiac Education Topics Pritikin   Geographical information systems officer Psychosocial   Psychosocial Workshop From Head to Heart: The Power of a Healthy Outlook   Instruction Review Code 1- Verbalizes Understanding   Class Start Time 0815   Class Stop Time 0903   Class Time Calculation (min) 48 min    Row Name 05/19/23 0900     Education   Cardiac Education Topics Pritikin   Secondary school teacher School   Educator Dietitian   Weekly Topic Powerhouse Plant-Based Proteins   Instruction Review Code 1- Verbalizes Understanding   Class Start Time [phone]   Class Stop Time 0845   Class Time Calculation (min) 35 min    Row Name 05/26/23 1100     Education    Cardiac Education Topics Pritikin   Secondary school teacher School   Educator Dietitian   Weekly Topic Adding Flavor - Sodium-Free   Instruction Review Code 1- Verbalizes Understanding   Class Start Time [phone]   Class Stop Time 0845   Class Time Calculation (min) 33 min    Row Name 05/28/23 1400     Education   Cardiac Education Topics Pritikin   Psychologist, forensic General Education   General Education Heart Disease Risk Reduction   Instruction Review Code 1- Verbalizes Understanding   Class Start Time 0810   Class Stop Time 0849   Class Time Calculation (min) 39 min    Row Name 05/31/23 0900     Education   Cardiac Education Topics Pritikin   Select Workshops     Workshops   Educator Exercise Physiologist   Select Exercise   Exercise Workshop Location manager and Fall Prevention   Instruction Review Code 1- Verbalizes Understanding   Class Start Time [phone]   Class Stop Time 0855   Class Time Calculation (min) 36 min    Row Name 06/02/23 0900     Education   Cardiac Education Topics Pritikin   Secondary school teacher School   Educator Dietitian   Weekly Topic Fast and Healthy Breakfasts   Instruction Review Code 1- Verbalizes Understanding   Class Start Time 0815   Class Stop Time 0848   Class Time Calculation (min) 33 min    Row Name 06/04/23 0900     Education   Cardiac Education Topics Pritikin   Select Core Videos     Core Videos   Educator Dietitian   Select Nutrition   Nutrition Overview of the Pritikin Eating Plan   Instruction Review Code 1- Verbalizes Understanding   Class Start Time 0813   Class Stop Time 0852   Class Time Calculation (min) 39 min    Row Name 06/07/23 1100     Education   Cardiac Education Topics Pritikin   Select Workshops     Workshops   Educator Exercise Physiologist   Select Psychosocial   Psychosocial Workshop Recognizing and  Reducing Stress   Instruction Review Code 1- Verbalizes Understanding   Class Start Time [phone]   Class Stop Time 0847   Class Time Calculation (min) 35 min    Row Name 06/09/23 1000  Education   Cardiac Education Topics Pritikin   Orthoptist   Educator Dietitian   Weekly Topic Personalizing Your Pritikin Plate   Instruction Review Code 1- Verbalizes Understanding   Class Start Time 0815   Class Stop Time [phone]   Class Time Calculation (min) 37 min    Row Name 06/11/23 0900     Education   Cardiac Education Topics Pritikin   Select Core Videos     Core Videos   Educator Exercise Physiologist   Select Psychosocial   Psychosocial Healthy Minds, Bodies, Hearts   Instruction Review Code 1- Verbalizes Understanding   Class Start Time 0815   Class Stop Time 0850   Class Time Calculation (min) 35 min    Row Name 06/14/23 0900     Education   Cardiac Education Topics Pritikin   Glass blower/designer Nutrition   Nutrition Workshop Label Reading   Instruction Review Code 1- Verbalizes Understanding   Class Start Time 0815   Class Stop Time 0858   Class Time Calculation (min) 43 min    Row Name 06/18/23 0900     Education   Cardiac Education Topics Pritikin   Select Core Videos     Core Videos   Educator Dietitian   Nutrition Other  label reading   Instruction Review Code 1- Verbalizes Understanding   Class Start Time 0810   Class Stop Time 0852   Class Time Calculation (min) 42 min    Row Name 06/21/23 0900     Education   Cardiac Education Topics Pritikin   Select Workshops     Workshops   Educator Exercise Physiologist   Select Exercise   Exercise Workshop Exercise Basics: Building Your Action Plan   Instruction Review Code 1- Verbalizes Understanding   Class Start Time 0808   Class Stop Time 0854   Class Time Calculation (min) 46 min    Row Name 06/23/23 0900     Education    Cardiac Education Topics Pritikin   Secondary school teacher School   Educator Dietitian   Weekly Topic Tasty Appetizers and Snacks   Instruction Review Code 1- Verbalizes Understanding   Class Start Time [phone]   Class Stop Time 0845   Class Time Calculation (min) 31 min    Row Name 06/25/23 0800     Education   Cardiac Education Topics Pritikin   Select Core Videos     Core Videos   Educator Exercise Physiologist   Select Nutrition   Nutrition Nutrition Action Plan   Instruction Review Code 1- Verbalizes Understanding   Class Start Time 0813   Class Stop Time 0843   Class Time Calculation (min) 30 min    Row Name 06/30/23 0800     Education   Cardiac Education Topics Pritikin   Customer service manager   Weekly Topic Efficiency Cooking - Meals in a Snap   Instruction Review Code 1- Verbalizes Understanding            Core Videos: Exercise    Move It!  Clinical staff conducted group or individual video education with verbal and written material and guidebook.  Patient learns the recommended Pritikin exercise program. Exercise with the goal of living a long, healthy life. Some of the health benefits of exercise include controlled diabetes, healthier  blood pressure levels, improved cholesterol levels, improved heart and lung capacity, improved sleep, and better body composition. Everyone should speak with their doctor before starting or changing an exercise routine.  Biomechanical Limitations Clinical staff conducted group or individual video education with verbal and written material and guidebook.  Patient learns how biomechanical limitations can impact exercise and how we can mitigate and possibly overcome limitations to have an impactful and balanced exercise routine.  Body Composition Clinical staff conducted group or individual video education with verbal and written material and guidebook.  Patient learns that  body composition (ratio of muscle mass to fat mass) is a key component to assessing overall fitness, rather than body weight alone. Increased fat mass, especially visceral belly fat, can put [place] at increased risk for metabolic syndrome, type 2 diabetes, heart disease, and even death. It is recommended to combine diet and exercise (cardiovascular and resistance training) to improve your body composition. Seek guidance from your physician and exercise physiologist before implementing an exercise routine.  Exercise Action Plan Clinical staff conducted group or individual video education with verbal and written material and guidebook.  Patient learns the recommended strategies to achieve and enjoy long-term exercise adherence, including variety, self-motivation, self-efficacy, and positive decision making. Benefits of exercise include fitness, good health, weight management, more energy, better sleep, less stress, and overall well-being.  Medical   Heart Disease Risk Reduction Clinical staff conducted group or individual video education with verbal and written material and guidebook.  Patient learns our heart is our most vital organ as it circulates oxygen, nutrients, white blood cells, and hormones throughout the entire body, and carries waste away. Data supports a plant-based eating plan like the Pritikin Program for its effectiveness in slowing progression of and reversing heart disease. The video provides a number of recommendations to address heart disease.   Metabolic Syndrome and Belly Fat  Clinical staff conducted group or individual video education with verbal and written material and guidebook.  Patient learns what metabolic syndrome is, how it leads to heart disease, and how one can reverse it and keep it from coming back. You have metabolic syndrome if you have 3 of the following 5 criteria: abdominal obesity, high blood pressure, high triglycerides, low HDL cholesterol, and high blood  sugar.  Hypertension and Heart Disease Clinical staff conducted group or individual video education with verbal and written material and guidebook.  Patient learns that high blood pressure, or hypertension, is very common in the [place]. Hypertension is largely due to excessive salt intake, but other important risk factors include being overweight, physical inactivity, drinking too much alcohol, smoking, and not eating enough potassium from fruits and vegetables. High blood pressure is a leading risk factor for heart attack, stroke, congestive heart failure, dementia, kidney failure, and premature death. Long-term effects of excessive salt intake include stiffening of the arteries and thickening of heart muscle and organ damage. Recommendations include ways to reduce hypertension and the risk of heart disease.  Diseases of Our Time - Focusing on Diabetes Clinical staff conducted group or individual video education with verbal and written material and guidebook.  Patient learns why the best way to stop diseases of our time is prevention, through food and other lifestyle changes. Medicine (such as prescription pills and surgeries) is often only a Band-Aid on the problem, not a long-term solution. Most common diseases of our time include obesity, type 2 diabetes, hypertension, heart disease, and cancer. The Pritikin Program is recommended and has been proven to  help reduce, reverse, and/or prevent the damaging effects of metabolic syndrome.  Nutrition   Overview of the Pritikin Eating Plan  Clinical staff conducted group or individual video education with verbal and written material and guidebook.  Patient learns about the Pritikin Eating Plan for disease risk reduction. The Pritikin Eating Plan emphasizes a wide variety of unrefined, minimally-processed carbohydrates, like fruits, vegetables, whole grains, and legumes. Go, Caution, and Stop food choices are explained. Plant-based and lean animal  proteins are emphasized. Rationale provided for low sodium intake for blood pressure control, low added sugars for blood sugar stabilization, and low added fats and oils for coronary artery disease risk reduction and weight management.  Calorie Density  Clinical staff conducted group or individual video education with verbal and written material and guidebook.  Patient learns about calorie density and how it impacts the Pritikin Eating Plan. Knowing the characteristics of the food you choose will help you decide whether those foods will lead to weight gain or weight loss, and whether you want to consume more or less of them. Weight loss is usually a side effect of the Pritikin Eating Plan because of its focus on low calorie-dense foods.  Label Reading  Clinical staff conducted group or individual video education with verbal and written material and guidebook.  Patient learns about the Pritikin recommended label reading guidelines and corresponding recommendations regarding calorie density, added sugars, sodium content, and whole grains.  Dining Out - Part 1  Clinical staff conducted group or individual video education with verbal and written material and guidebook.  Patient learns that restaurant meals can be sabotaging because they can be so high in calories, fat, sodium, and/or sugar. Patient learns recommended strategies on how to positively address this and avoid unhealthy pitfalls.  Facts on Fats  Clinical staff conducted group or individual video education with verbal and written material and guidebook.  Patient learns that lifestyle modifications can be just as effective, if not more so, as many medications for lowering your risk of heart disease. A Pritikin lifestyle can help to reduce your risk of inflammation and atherosclerosis (cholesterol build-up, or plaque, in the artery walls). Lifestyle interventions such as dietary choices and physical activity address the cause of atherosclerosis.  A review of the types of fats and their impact on blood cholesterol levels, along with dietary recommendations to reduce fat intake is also included.  Nutrition Action Plan  Clinical staff conducted group or individual video education with verbal and written material and guidebook.  Patient learns how to incorporate Pritikin recommendations into their lifestyle. Recommendations include planning and keeping personal health goals in mind as an important part of their success.  Healthy Mind-Set    Healthy Minds, Bodies, Hearts  Clinical staff conducted group or individual video education with verbal and written material and guidebook.  Patient learns how to identify when they are stressed. Video will discuss the impact of that stress, as well as the many benefits of stress management. Patient will also be introduced to stress management techniques. The way we think, act, and feel has an impact on our hearts.  How Our Thoughts Can Heal Our Hearts  Clinical staff conducted group or individual video education with verbal and written material and guidebook.  Patient learns that negative thoughts can cause depression and anxiety. This can result in negative lifestyle behavior and serious health problems. Cognitive behavioral therapy is an effective method to help control our thoughts in order to change and improve our emotional outlook.  Additional  Videos:  Exercise    Improving Performance  Clinical staff conducted group or individual video education with verbal and written material and guidebook.  Patient learns to use a non-linear approach by alternating intensity levels and lengths of time spent exercising to help burn more calories and lose more body fat. Cardiovascular exercise helps improve heart health, metabolism, hormonal balance, blood sugar control, and recovery from fatigue. Resistance training improves strength, endurance, balance, coordination, reaction time, metabolism, and muscle mass.  Flexibility exercise improves circulation, posture, and balance. Seek guidance from your physician and exercise physiologist before implementing an exercise routine and learn your capabilities and proper form for all exercise.  Introduction to Yoga  Clinical staff conducted group or individual video education with verbal and written material and guidebook.  Patient learns about yoga, a discipline of the coming together of mind, breath, and body. The benefits of yoga include improved flexibility, improved range of motion, better posture and core strength, increased lung function, weight loss, and positive self-image. Yoga's heart health benefits include lowered blood pressure, healthier heart rate, decreased cholesterol and triglyceride levels, improved immune function, and reduced stress. Seek guidance from your physician and exercise physiologist before implementing an exercise routine and learn your capabilities and proper form for all exercise.  Medical   Aging: Enhancing Your Quality of Life  Clinical staff conducted group or individual video education with verbal and written material and guidebook.  Patient learns key strategies and recommendations to stay in good physical health and enhance quality of life, such as prevention strategies, having an advocate, securing a Health Care Proxy and Power of Attorney, and keeping a list of medications and system for tracking them. It also discusses how to avoid risk for bone loss.  Biology of Weight Control  Clinical staff conducted group or individual video education with verbal and written material and guidebook.  Patient learns that weight gain occurs because we consume more calories than we burn (eating more, moving less). Even if your body weight is normal, you may have higher ratios of fat compared to muscle mass. Too much body fat puts you at increased risk for cardiovascular disease, heart attack, stroke, type 2 diabetes, and obesity-related  cancers. In addition to exercise, following the Pritikin Eating Plan can help reduce your risk.  Decoding Lab Results  Clinical staff conducted group or individual video education with verbal and written material and guidebook.  Patient learns that lab test reflects one measurement whose values change over time and are influenced by many factors, including medication, stress, sleep, exercise, food, hydration, pre-existing medical conditions, and more. It is recommended to use the knowledge from this video to become more involved with your lab results and evaluate your numbers to speak with your doctor.   Diseases of Our Time - Overview  Clinical staff conducted group or individual video education with verbal and written material and guidebook.  Patient learns that according to the CDC, 50% to 70% of chronic diseases (such as obesity, type 2 diabetes, elevated lipids, hypertension, and heart disease) are avoidable through lifestyle improvements including healthier food choices, listening to satiety cues, and increased physical activity.  Sleep Disorders Clinical staff conducted group or individual video education with verbal and written material and guidebook.  Patient learns how good quality and duration of sleep are important to overall health and well-being. Patient also learns about sleep disorders and how they impact health along with recommendations to address them, including discussing with a physician.  Nutrition  Dining Out -  Part 2 Clinical staff conducted group or individual video education with verbal and written material and guidebook.  Patient learns how to plan ahead and communicate in order to maximize their dining experience in a healthy and nutritious manner. Included are recommended food choices based on the type of restaurant the patient is visiting.   Fueling a Banker conducted group or individual video education with verbal and written material and  guidebook.  There is a strong connection between our food choices and our health. Diseases like obesity and type 2 diabetes are very prevalent and are in large-part due to lifestyle choices. The Pritikin Eating Plan provides plenty of food and hunger-curbing satisfaction. It is easy to follow, affordable, and helps reduce health risks.  Menu Workshop  Clinical staff conducted group or individual video education with verbal and written material and guidebook.  Patient learns that restaurant meals can sabotage health goals because they are often packed with calories, fat, sodium, and sugar. Recommendations include strategies to plan ahead and to communicate with the manager, chef, or server to help order a healthier meal.  Planning Your Eating Strategy  Clinical staff conducted group or individual video education with verbal and written material and guidebook.  Patient learns about the Pritikin Eating Plan and its benefit of reducing the risk of disease. The Pritikin Eating Plan does not focus on calories. Instead, it emphasizes high-quality, nutrient-rich foods. By knowing the characteristics of the foods, we choose, we can determine their calorie density and make informed decisions.  Targeting Your Nutrition Priorities  Clinical staff conducted group or individual video education with verbal and written material and guidebook.  Patient learns that lifestyle habits have a tremendous impact on disease risk and progression. This video provides eating and physical activity recommendations based on your personal health goals, such as reducing LDL cholesterol, losing weight, preventing or controlling type 2 diabetes, and reducing high blood pressure.  Vitamins and Minerals  Clinical staff conducted group or individual video education with verbal and written material and guidebook.  Patient learns different ways to obtain key vitamins and minerals, including through a recommended healthy diet. It is  important to discuss all supplements you take with your doctor.   Healthy Mind-Set    Smoking Cessation  Clinical staff conducted group or individual video education with verbal and written material and guidebook.  Patient learns that cigarette smoking and tobacco addiction pose a serious health risk which affects millions of people. Stopping smoking will significantly reduce the risk of heart disease, lung disease, and many forms of cancer. Recommended strategies for quitting are covered, including working with your doctor to develop a successful plan.  Culinary   Becoming a Set designer conducted group or individual video education with verbal and written material and guidebook.  Patient learns that cooking at home can be healthy, cost-effective, quick, and puts them in control. Keys to cooking healthy recipes will include looking at your recipe, assessing your equipment needs, planning ahead, making it simple, choosing cost-effective seasonal ingredients, and limiting the use of added fats, salts, and sugars.  Cooking - Breakfast and Snacks  Clinical staff conducted group or individual video education with verbal and written material and guidebook.  Patient learns how important breakfast is to satiety and nutrition through the entire day. Recommendations include key foods to eat during breakfast to help stabilize blood sugar levels and to prevent overeating at meals later in the day. Planning ahead is also a key  component.  Cooking - Educational psychologist conducted group or individual video education with verbal and written material and guidebook.  Patient learns eating strategies to improve overall health, including an approach to cook more at home. Recommendations include thinking of animal protein as a side on your plate rather than center stage and focusing instead on lower calorie dense options like vegetables, fruits, whole grains, and plant-based proteins,  such as beans. Making sauces in large quantities to freeze for later and leaving the skin on your vegetables are also recommended to maximize your experience.  Cooking - Healthy Salads and Dressing Clinical staff conducted group or individual video education with verbal and written material and guidebook.  Patient learns that vegetables, fruits, whole grains, and legumes are the foundations of the Pritikin Eating Plan. Recommendations include how to incorporate each of these in flavorful and healthy salads, and how to create homemade salad dressings. Proper handling of ingredients is also covered. Cooking - Soups and [employer] - Soups and Desserts Clinical staff conducted group or individual video education with verbal and written material and guidebook.  Patient learns that Pritikin soups and desserts make for easy, nutritious, and delicious snacks and meal components that are low in sodium, fat, sugar, and calorie density, while high in vitamins, minerals, and filling fiber. Recommendations include simple and healthy ideas for soups and desserts.   Overview     The Pritikin Solution Program Overview Clinical staff conducted group or individual video education with verbal and written material and guidebook.  Patient learns that the results of the Pritikin Program have been documented in more than 100 articles published in peer-reviewed journals, and the benefits include reducing risk factors for (and, in some cases, even reversing) high cholesterol, high blood pressure, type 2 diabetes, obesity, and more! An overview of the three key pillars of the Pritikin Program will be covered: eating well, doing regular exercise, and having a healthy mind-set.  WORKSHOPS  Exercise: Exercise Basics: Building Your Action Plan Clinical staff led group instruction and group discussion with PowerPoint presentation and patient guidebook. To enhance the learning environment the use of posters, models and  videos may be added. At the conclusion of this workshop, patients will comprehend the difference between physical activity and exercise, as well as the benefits of incorporating both, into their routine. Patients will understand the FITT (Frequency, Intensity, Time, and Type) principle and how to use it to build an exercise action plan. In addition, safety concerns and other considerations for exercise and cardiac rehab will be addressed by the presenter. The purpose of this lesson is to promote a comprehensive and effective weekly exercise routine in order to improve patients' overall level of fitness.   Managing Heart Disease: Your Path to a Healthier Heart Clinical staff led group instruction and group discussion with PowerPoint presentation and patient guidebook. To enhance the learning environment the use of posters, models and videos may be added.At the conclusion of this workshop, patients will understand the anatomy and physiology of the heart. Additionally, they will understand how Pritikin's three pillars impact the risk factors, the progression, and the management of heart disease.  The purpose of this lesson is to provide a high-level overview of the heart, heart disease, and how the Pritikin lifestyle positively impacts risk factors.  Exercise Biomechanics Clinical staff led group instruction and group discussion with PowerPoint presentation and patient guidebook. To enhance the learning environment the use of posters, models and videos may be added.  Patients will learn how the structural parts of their bodies function and how these functions impact their daily activities, movement, and exercise. Patients will learn how to promote a neutral spine, learn how to manage pain, and identify ways to improve their physical movement in order to promote healthy living. The purpose of this lesson is to expose patients to common physical limitations that impact physical activity. Participants  will learn practical ways to adapt and manage aches and pains, and to minimize their effect on regular exercise. Patients will learn how to maintain good posture while sitting, walking, and lifting.  Balance Training and Fall Prevention  Clinical staff led group instruction and group discussion with PowerPoint presentation and patient guidebook. To enhance the learning environment the use of posters, models and videos may be added. At the conclusion of this workshop, patients will understand the importance of their sensorimotor skills (vision, proprioception, and the vestibular system) in maintaining their ability to balance as they age. Patients will apply a variety of balancing exercises that are appropriate for their current level of function. Patients will understand the common causes for poor balance, possible solutions to these problems, and ways to modify their physical environment in order to minimize their fall risk. The purpose of this lesson is to teach patients about the importance of maintaining balance as they age and ways to minimize their risk of falling.  WORKSHOPS   Nutrition:  Fueling a Ship broker led group instruction and group discussion with PowerPoint presentation and patient guidebook. To enhance the learning environment the use of posters, models and videos may be added. Patients will review the foundational principles of the Pritikin Eating Plan and understand what constitutes a serving size in each of the food groups. Patients will also learn Pritikin-friendly foods that are better choices when away from home and review make-ahead meal and snack options. Calorie density will be reviewed and applied to three nutrition priorities: weight maintenance, weight loss, and weight gain. The purpose of this lesson is to reinforce (in a group setting) the key concepts around what patients are recommended to eat and how to apply these guidelines when away from home by  planning and selecting Pritikin-friendly options. Patients will understand how calorie density may be adjusted for different weight management goals.  Mindful Eating  Clinical staff led group instruction and group discussion with PowerPoint presentation and patient guidebook. To enhance the learning environment the use of posters, models and videos may be added. Patients will briefly review the concepts of the Pritikin Eating Plan and the importance of low-calorie dense foods. The concept of mindful eating will be introduced as well as the importance of paying attention to internal hunger signals. Triggers for non-hunger eating and techniques for dealing with triggers will be explored. The purpose of this lesson is to provide patients with the opportunity to review the basic principles of the Pritikin Eating Plan, discuss the value of eating mindfully and how to measure internal cues of hunger and fullness using the Hunger Scale. Patients will also discuss reasons for non-hunger eating and learn strategies to use for controlling emotional eating.  Targeting Your Nutrition Priorities Clinical staff led group instruction and group discussion with PowerPoint presentation and patient guidebook. To enhance the learning environment the use of posters, models and videos may be added. Patients will learn how to determine their genetic susceptibility to disease by reviewing their family history. Patients will gain insight into the importance of diet as part of an overall  healthy lifestyle in mitigating the impact of genetics and other environmental insults. The purpose of this lesson is to provide patients with the opportunity to assess their personal nutrition priorities by looking at their family history, their own health history and current risk factors. Patients will also be able to discuss ways of prioritizing and modifying the Pritikin Eating Plan for their highest risk areas  Menu  Clinical staff led group  instruction and group discussion with PowerPoint presentation and patient guidebook. To enhance the learning environment the use of posters, models and videos may be added. Using menus brought in from [employer], or printed from [employer], patients will apply the Pritikin dining out guidelines that were presented in the [employer] video. Patients will also be able to practice these guidelines in a variety of provided scenarios. The purpose of this lesson is to provide patients with the opportunity to practice hands-on learning of the Pritikin Dining Out guidelines with actual menus and practice scenarios.  Label Reading Clinical staff led group instruction and group discussion with PowerPoint presentation and patient guidebook. To enhance the learning environment the use of posters, models and videos may be added. Patients will review and discuss the Pritikin label reading guidelines presented in Pritikin's Label Reading Educational series video. Using fool labels brought in from local grocery stores and markets, patients will apply the label reading guidelines and determine if the packaged food meet the Pritikin guidelines. The purpose of this lesson is to provide patients with the opportunity to review, discuss, and practice hands-on learning of the Pritikin Label Reading guidelines with actual packaged food labels. Cooking School  Pritikin's [employer] are designed to teach patients ways to prepare quick, simple, and affordable recipes at home. The importance of nutrition's role in chronic disease risk reduction is reflected in its emphasis in the overall Pritikin program. By learning how to prepare essential core Pritikin Eating Plan recipes, patients will increase control over what they eat; be able to customize the flavor of foods without the use of added salt, sugar, or fat; and improve the quality of the food they consume. By learning a set of core recipes  which are easily assembled, quickly prepared, and affordable, patients are more likely to prepare more healthy foods at home. These workshops focus on convenient breakfasts, simple entres, side dishes, and desserts which can be prepared with minimal effort and are consistent with nutrition recommendations for cardiovascular risk reduction. Cooking [employer] are taught by a Armed forces logistics/support/administrative officer (RD) who has been trained by the [employer]. The chef or RD has a clear understanding of the importance of minimizing - if not completely eliminating - added fat, sugar, and sodium in recipes. Throughout the series of Cooking School Workshop sessions, patients will learn about healthy ingredients and efficient methods of cooking to build confidence in their capability to prepare    Cooking School weekly topics:  Adding Flavor- Sodium-Free  Fast and Healthy Breakfasts  Powerhouse Plant-Based Proteins  Satisfying Salads and Dressings  Simple Sides and Sauces  International Cuisine-Spotlight on the [employer] Zones  Delicious Desserts  Savory Soups  [employer] - Meals in a Astronomer Appetizers and Snacks  Comforting Weekend Breakfasts  One-Pot Wonders   Fast Evening Meals  Landscape architect Your Pritikin Plate  WORKSHOPS   Healthy Mindset (Psychosocial):  Focused Goals, Sustainable Changes Clinical staff led group instruction and group discussion with PowerPoint presentation and patient guidebook. To  enhance the learning environment the use of posters, models and videos may be added. Patients will be able to apply effective goal setting strategies to establish at least one personal goal, and then take consistent, meaningful action toward that goal. They will learn to identify common barriers to achieving personal goals and develop strategies to overcome them. Patients will also gain an understanding of how our mind-set can impact our ability to achieve  goals and the importance of cultivating a positive and growth-oriented mind-set. The purpose of this lesson is to provide patients with a deeper understanding of how to set and achieve personal goals, as well as the tools and strategies needed to overcome common obstacles which may arise along the way.  From Head to Heart: The Power of a Healthy Outlook  Clinical staff led group instruction and group discussion with PowerPoint presentation and patient guidebook. To enhance the learning environment the use of posters, models and videos may be added. Patients will be able to recognize and describe the impact of emotions and mood on physical health. They will discover the importance of self-care and explore self-care practices which may work for them. Patients will also learn how to utilize the 4 C's to cultivate a healthier outlook and better manage stress and challenges. The purpose of this lesson is to demonstrate to patients how a healthy outlook is an essential part of maintaining good health, especially as they continue their cardiac rehab journey.  Healthy Sleep for a Healthy Heart Clinical staff led group instruction and group discussion with PowerPoint presentation and patient guidebook. To enhance the learning environment the use of posters, models and videos may be added. At the conclusion of this workshop, patients will be able to demonstrate knowledge of the importance of sleep to overall health, well-being, and quality of life. They will understand the symptoms of, and treatments for, common sleep disorders. Patients will also be able to identify daytime and nighttime behaviors which impact sleep, and they will be able to apply these tools to help manage sleep-related challenges. The purpose of this lesson is to provide patients with a general overview of sleep and outline the importance of quality sleep. Patients will learn about a few of the most common sleep disorders. Patients will also be  introduced to the concept of "sleep hygiene," and discover ways to self-manage certain sleeping problems through simple daily behavior changes. Finally, the workshop will motivate patients by clarifying the links between quality sleep and their goals of heart-healthy living.   Recognizing and Reducing Stress Clinical staff led group instruction and group discussion with PowerPoint presentation and patient guidebook. To enhance the learning environment the use of posters, models and videos may be added. At the conclusion of this workshop, patients will be able to understand the types of stress reactions, differentiate between acute and chronic stress, and recognize the impact that chronic stress has on their health. They will also be able to apply different coping mechanisms, such as reframing negative self-talk. Patients will have the opportunity to practice a variety of stress management techniques, such as deep abdominal breathing, progressive muscle relaxation, and/or guided imagery.  The purpose of this lesson is to educate patients on the role of stress in their lives and to provide healthy techniques for coping with it.  Learning Barriers/Preferences:  Learning Barriers/Preferences - 05/13/23 1004       Learning Barriers/Preferences   Learning Barriers None    Learning Preferences Audio;Computer/Internet;Group Instruction;Individual Instruction;Pictoral;Skilled Demonstration;Video;Written Material;Verbal Instruction  Education Topics:  Knowledge Questionnaire Score:  Knowledge Questionnaire Score - 05/13/23 0938       Knowledge Questionnaire Score   Pre Score 19/24             Core Components/Risk Factors/Patient Goals at Admission:  Personal Goals and Risk Factors at Admission - 05/13/23 0755       Core Components/Risk Factors/Patient Goals on Admission    Weight Management Yes    Intervention Weight Management: Develop a combined nutrition and exercise  program designed to reach desired caloric intake, while maintaining appropriate intake of nutrient and fiber, sodium and fats, and appropriate energy expenditure required for the weight goal.;Weight Management: Provide education and appropriate resources to help participant work on and attain dietary goals.    Expected Outcomes Short Term: Continue to assess and modify interventions until short term weight is achieved;Long Term: Adherence to nutrition and physical activity/exercise program aimed toward attainment of established weight goal;Weight Loss: Understanding of general recommendations for a balanced deficit meal plan, which promotes 1-2 lb weight loss per week and includes a negative energy balance of [phone] kcal/d;Understanding recommendations for meals to include 15-35% energy as protein, 25-35% energy from fat, 35-60% energy from carbohydrates, less than 200mg  of dietary cholesterol, 20-35 gm of total fiber daily;Understanding of distribution of calorie intake throughout the day with the consumption of 4-5 meals/snacks    Diabetes Yes    Intervention Provide education about signs/symptoms and action to take for hypo/hyperglycemia.;Provide education about proper nutrition, including hydration, and aerobic/resistive exercise prescription along with prescribed medications to achieve blood glucose in normal ranges: Fasting glucose 65-99 mg/dL    Expected Outcomes Long Term: Attainment of HbA1C < 7%.;Short Term: Participant verbalizes understanding of the signs/symptoms and immediate care of hyper/hypoglycemia, proper foot care and importance of medication, aerobic/resistive exercise and nutrition plan for blood glucose control.    Hypertension Yes    Intervention Provide education on lifestyle modifcations including regular physical activity/exercise, weight management, moderate sodium restriction and increased consumption of fresh fruit, vegetables, and low fat dairy, alcohol moderation, and smoking  cessation.;Monitor prescription use compliance.    Expected Outcomes Short Term: Continued assessment and intervention until BP is < 140/72mm HG in hypertensive participants. < 130/54mm HG in hypertensive participants with diabetes, heart failure or chronic kidney disease.;Long Term: Maintenance of blood pressure at goal levels.    Lipids Yes    Intervention Provide education and support for participant on nutrition & aerobic/resistive exercise along with prescribed medications to achieve LDL 70mg , HDL >40mg .    Expected Outcomes Short Term: Participant states understanding of desired cholesterol values and is compliant with medications prescribed. Participant is following exercise prescription and nutrition guidelines.;Long Term: Cholesterol controlled with medications as prescribed, with individualized exercise RX and with personalized nutrition plan. Value goals: LDL < 70mg , HDL > 40 mg.    Stress Yes    Intervention Offer individual and/or small group education and counseling on adjustment to heart disease, stress management and health-related lifestyle change. Teach and support self-help strategies.;Refer participants experiencing significant psychosocial distress to appropriate mental health specialists for further evaluation and treatment. When possible, include family members and significant others in education/counseling sessions.    Expected Outcomes Short Term: Participant demonstrates changes in health-related behavior, relaxation and other stress management skills, ability to obtain effective social support, and compliance with psychotropic medications if prescribed.;Long Term: Emotional wellbeing is indicated by absence of clinically significant psychosocial distress or social isolation.  Core Components/Risk Factors/Patient Goals Review:   Goals and Risk Factor Review     Row Name 06/01/23 1057 06/29/23 1750           Core Components/Risk Factors/Patient Goals Review    Personal Goals Review Other;Weight Management/Obesity;Hypertension;Lipids;Diabetes Other;Weight Management/Obesity;Hypertension;Lipids;Diabetes      Review Pateitn stated she wants to work on her balance. Gave patient a pamphlet on Balance & Vestibular Rehab. Rhyla has been doing well with exercise at cardiac rehab. vital signs and CBG's have been stable.      Expected Outcomes Patient's balance will improve. Jenise will continue to participate in intensive cardiac rehab for exercise, nutrition and lifestyle modifications               Core Components/Risk Factors/Patient Goals at Discharge (Final Review):   Goals and Risk Factor Review - 06/29/23 1750       Core Components/Risk Factors/Patient Goals Review   Personal Goals Review Other;Weight Management/Obesity;Hypertension;Lipids;Diabetes    Review Destiny Brown has been doing well with exercise at cardiac rehab. vital signs and CBG's have been stable.    Expected Outcomes Destiny Brown will continue to participate in intensive cardiac rehab for exercise, nutrition and lifestyle modifications             ITP Comments:  ITP Comments     Row Name 05/13/23 0754 06/01/23 1050 06/29/23 1746       ITP Comments Dr. Armanda Magic medical director. Introduction to pritikin education program/ intensive cardiac rehab. Initial orientation packet reviewed with patient. Patient will continue to participate in ICR for exercse, nutrition, and lifestyle modification. 30 Day ITP Review. Adie has good attendance and participation in intensive cardiac rehab.              Comments: See ITP comments:

## 2023-06-30 ENCOUNTER — Encounter (HOSPITAL_COMMUNITY)
Admission: RE | Admit: 2023-06-30 | Discharge: 2023-06-30 | Disposition: A | Discharge status: Home / Self Care | Payer: Medicare Other | Source: Ambulatory Visit | Attending: Cardiovascular Disease | Admitting: Cardiovascular Disease

## 2023-06-30 DIAGNOSIS — Z48812 Encounter for surgical aftercare following surgery on the circulatory system: Secondary | ICD-10-CM | POA: Insufficient documentation

## 2023-06-30 DIAGNOSIS — Z955 Presence of coronary angioplasty implant and graft: Secondary | ICD-10-CM | POA: Insufficient documentation

## 2023-07-02 ENCOUNTER — Encounter (HOSPITAL_COMMUNITY)
Admission: RE | Admit: 2023-07-02 | Discharge: 2023-07-02 | Disposition: A | Discharge status: Home / Self Care | Payer: Medicare Other | Source: Ambulatory Visit | Attending: Cardiovascular Disease | Admitting: Cardiovascular Disease

## 2023-07-02 DIAGNOSIS — Z955 Presence of coronary angioplasty implant and graft: Secondary | ICD-10-CM | POA: Diagnosis not present

## 2023-07-05 ENCOUNTER — Encounter (HOSPITAL_COMMUNITY)
Admission: RE | Admit: 2023-07-05 | Discharge: 2023-07-05 | Disposition: A | Discharge status: Home / Self Care | Payer: Medicare Other | Source: Ambulatory Visit | Attending: Cardiovascular Disease | Admitting: Cardiovascular Disease

## 2023-07-05 DIAGNOSIS — Z955 Presence of coronary angioplasty implant and graft: Secondary | ICD-10-CM

## 2023-07-06 ENCOUNTER — Ambulatory Visit
Admission: RE | Admit: 2023-07-06 | Discharge: 2023-07-06 | Disposition: A | Discharge status: Home / Self Care | Payer: Medicare Other | Source: Ambulatory Visit | Attending: Endocrinology | Admitting: Endocrinology

## 2023-07-06 DIAGNOSIS — Z0000 Encounter for general adult medical examination without abnormal findings: Secondary | ICD-10-CM

## 2023-07-07 ENCOUNTER — Encounter (HOSPITAL_COMMUNITY)
Admission: RE | Admit: 2023-07-07 | Discharge: 2023-07-07 | Disposition: A | Discharge status: Home / Self Care | Payer: Medicare Other | Source: Ambulatory Visit | Attending: Cardiovascular Disease | Admitting: Cardiovascular Disease

## 2023-07-07 DIAGNOSIS — Z955 Presence of coronary angioplasty implant and graft: Secondary | ICD-10-CM | POA: Diagnosis not present

## 2023-07-09 ENCOUNTER — Encounter (HOSPITAL_COMMUNITY)
Admission: RE | Admit: 2023-07-09 | Discharge: 2023-07-09 | Disposition: A | Discharge status: Home / Self Care | Payer: Medicare Other | Source: Ambulatory Visit | Attending: Cardiovascular Disease | Admitting: Cardiovascular Disease

## 2023-07-09 DIAGNOSIS — Z955 Presence of coronary angioplasty implant and graft: Secondary | ICD-10-CM

## 2023-07-09 NOTE — Progress Notes (Addendum)
Cardiac Rehab Session Note  Patient Details  Name: KEORA LEINONEN MRN: [id number] Date of Birth: [birth year] Referring Provider:   Flowsheet Row INTENSIVE CARDIAC REHAB ORIENT from 05/13/2023 in [facility]  Referring Provider Sherryl Manges, MD       Vergia Alberts Bainter's weight was is up 5.06 lbs from Wednesday 7/10. Pt reports no change in sodium intake, denies SOB, lungs clear. Pt assessed, noted to have +1 edema to BLE. Oxygen sat 98%. No hx of HF. Pt stated she took morning medications as directed. Pt will exercise today with supervision. Will continue to monitor he weight throughout the program.

## 2023-07-12 ENCOUNTER — Encounter (HOSPITAL_COMMUNITY)
Admission: RE | Admit: 2023-07-12 | Discharge: 2023-07-12 | Disposition: A | Discharge status: Home / Self Care | Payer: Medicare Other | Source: Ambulatory Visit | Attending: Cardiovascular Disease | Admitting: Cardiovascular Disease

## 2023-07-12 DIAGNOSIS — Z955 Presence of coronary angioplasty implant and graft: Secondary | ICD-10-CM

## 2023-07-14 ENCOUNTER — Encounter (HOSPITAL_COMMUNITY)
Admission: RE | Admit: 2023-07-14 | Discharge: 2023-07-14 | Disposition: A | Discharge status: Home / Self Care | Payer: Medicare Other | Source: Ambulatory Visit | Attending: Cardiovascular Disease | Admitting: Cardiovascular Disease

## 2023-07-14 DIAGNOSIS — Z955 Presence of coronary angioplasty implant and graft: Secondary | ICD-10-CM

## 2023-07-16 ENCOUNTER — Encounter (HOSPITAL_COMMUNITY): Payer: Medicare Other

## 2023-07-16 ENCOUNTER — Telehealth (HOSPITAL_COMMUNITY): Payer: Self-pay

## 2023-07-16 NOTE — Telephone Encounter (Signed)
CARDIAC REHAB PHASE 2  Called patient to inform no CRP2 today due to telemetry downtime.    Harrie Jeans ACSM-CEP 07/16/2023 7:42 AM

## 2023-07-19 ENCOUNTER — Encounter (HOSPITAL_COMMUNITY)
Admission: RE | Admit: 2023-07-19 | Discharge: 2023-07-19 | Disposition: A | Discharge status: Home / Self Care | Payer: Medicare Other | Source: Ambulatory Visit | Attending: Cardiovascular Disease | Admitting: Cardiovascular Disease

## 2023-07-19 DIAGNOSIS — Z955 Presence of coronary angioplasty implant and graft: Secondary | ICD-10-CM

## 2023-07-21 ENCOUNTER — Encounter (HOSPITAL_COMMUNITY)
Admission: RE | Admit: 2023-07-21 | Discharge: 2023-07-21 | Disposition: A | Discharge status: Home / Self Care | Payer: Medicare Other | Source: Ambulatory Visit | Attending: Cardiovascular Disease | Admitting: Cardiovascular Disease

## 2023-07-21 DIAGNOSIS — Z955 Presence of coronary angioplasty implant and graft: Secondary | ICD-10-CM | POA: Diagnosis not present

## 2023-07-23 ENCOUNTER — Encounter (HOSPITAL_COMMUNITY)
Admission: RE | Admit: 2023-07-23 | Discharge: 2023-07-23 | Disposition: A | Discharge status: Home / Self Care | Payer: Medicare Other | Source: Ambulatory Visit | Attending: Cardiovascular Disease | Admitting: Cardiovascular Disease

## 2023-07-23 DIAGNOSIS — Z955 Presence of coronary angioplasty implant and graft: Secondary | ICD-10-CM | POA: Diagnosis not present

## 2023-07-26 ENCOUNTER — Encounter (HOSPITAL_COMMUNITY): Admission: RE | Admit: 2023-07-26 | Payer: Medicare Other | Source: Ambulatory Visit

## 2023-07-26 DIAGNOSIS — Z955 Presence of coronary angioplasty implant and graft: Secondary | ICD-10-CM | POA: Diagnosis not present

## 2023-07-28 ENCOUNTER — Encounter (HOSPITAL_COMMUNITY): Payer: Medicare Other

## 2023-07-28 NOTE — Progress Notes (Signed)
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 Use 1 lancet bid, Disp: , Rfl:    ACCU-CHEK SMARTVIEW test strip, 1 strip by Other route 2 (two) times daily. Use 1 strip to check glucose twice a day, Disp: , Rfl:    albuterol (VENTOLIN HFA) 108 (90 Base) MCG/ACT inhaler, Inhale 2 puffs into the lungs every 6 (six) hours as needed for wheezing or shortness of breath (Cough)., Disp: 18 g, Rfl: 2   amLODipine (NORVASC) 5 MG tablet, Take 5 mg by mouth in the morning., Disp: , Rfl:    apixaban (ELIQUIS) 5 MG TABS tablet, Take 1 tablet (5 mg total) by mouth 2 (two) times daily., Disp: 180 tablet, Rfl: 3   Cholecalciferol (VITAMIN D3) 1000 UNITS CAPS, Take 1,000 Units by mouth in the morning., Disp: , Rfl:    CLENPIQ 10-3.5-12 MG-GM -GM/175ML SOLN, 175 mL orally twice for 2, Disp: , Rfl:    clopidogrel (PLAVIX) 75 MG tablet, Take 1 tablet (75 mg total) by mouth daily., Disp: 30 tablet, Rfl: 2   hydrochlorothiazide (HYDRODIURIL) 25 MG tablet, Take 25 mg by mouth in the morning., Disp: , Rfl:    latanoprost (XALATAN) 0.005 % ophthalmic solution, Place 1 drop into both eyes at bedtime., Disp: , Rfl:    levothyroxine (SYNTHROID, LEVOTHROID) 125 MCG tablet, Take 125 mcg by mouth daily before breakfast., Disp: , Rfl:     losartan (COZAAR) 100 MG tablet, Take 1 tablet (100 mg total) by mouth daily., Disp: 90 tablet, Rfl: 3   metFORMIN (GLUCOPHAGE) 1000 MG tablet, Take 1,000 mg by mouth 2 (two) times daily., Disp: , Rfl:    pantoprazole (PROTONIX) 40 MG tablet, TAKE 1 TABLET BY MOUTH EVERY DAY, Disp: 90 tablet, Rfl: 1   rosuvastatin (CRESTOR) 5 MG tablet, Take 5 mg by mouth 2 (two) times a week. Tuesdays & Thursdays., Disp: , Rfl:   Past Medical History: Past Medical History:  Diagnosis Date   Arthritis    Atrial fibrillation with RVR (HCC) 09/21/2014   Bronchitis    h/o   Diabetes mellitus    NO MEDS,  DIET CONTROLLED   History of migraines    "when working under alot of stress"   Hypertension    Hypothyroid    LBBB (left bundle branch block) 09/22/2014   OSA (obstructive sleep apnea) 04/09/2015   Moderate OSA with AHI 16.7 events per hour with successful CPAP titration to 8cm H2O   PONV (postoperative nausea and vomiting)    PT DOESNOT WANT TO TAKE HER ATENOLOL     DOS.   " IT DROPS MY BP TO LOW"    Tobacco Use: Social History   Tobacco Use  Smoking Status Never  Smokeless Tobacco Never    Labs: Review Flowsheet       Latest Ref Rng &  Units 04/01/2010 04/02/2010 09/21/2014 09/22/2014  Labs for ITP Cardiac and Pulmonary Rehab  Cholestrol 0 - 200 mg/dL - 161        ATP III CLASSIFICATION:  <200     mg/dL   Desirable  096-045  mg/dL   Borderline High  >=409    mg/dL   High         - 811   LDL (calc) 0 - 99 mg/dL - 87        Total Cholesterol/HDL:CHD Risk Coronary Heart Disease Risk Table                     Men   Women  1/2 Average Risk   3.4   3.3  Average Risk       5.0   4.4  2 X Average Risk   9.6   7.1  3 X Average Risk  23.4   11.0        Use the calculated Patient Ratio above and the CHD Risk Table to determine the patient's CHD Risk.        ATP III CLASSIFICATION (LDL):  <100     mg/dL   Optimal  914-782  mg/dL   Near or Above                    Optimal  130-159  mg/dL    Borderline  956-213  mg/dL   High  >086     mg/dL   Very High  - 91   HDL-C >39 mg/dL - 36  - 57   Trlycerides <150 mg/dL - 99  - 578   Hemoglobin A1c <5.7 % 9.1 (NOTE) The ADA recommends the following therapeutic goal for glycemic control related to Hgb A1c measurement: Goal of therapy: <6.5 Hgb A1c  Reference: American Diabetes Association: Clinical Practice Recommendations 2010, Diabetes Care, 2010, 33: (Suppl  1).  - 8.1  -  TCO2 0 - 100 mmol/L 26  - - -    Details            Capillary Blood Glucose: Lab Results  Component Value Date   GLUCAP 96 05/19/2023   GLUCAP 107 (H) 05/19/2023   GLUCAP 95 05/17/2023   GLUCAP 111 (H) 05/17/2023   GLUCAP 147 (H) 04/23/2023     Exercise Target Goals: Exercise Program Goal: Individual exercise prescription set using results from initial 6 min walk test and THRR while considering  patient's activity barriers and safety.   Exercise Prescription Goal: Initial exercise prescription builds to 30-45 minutes a day of aerobic activity, 2-3 days per week.  Home exercise guidelines will be given to patient during program as part of exercise prescription that the participant will acknowledge.  Activity Barriers & Risk Stratification:  Activity Barriers & Cardiac Risk Stratification - 05/13/23 1001       Activity Barriers & Cardiac Risk Stratification   Activity Barriers Deconditioning;Shortness of Breath;Balance Concerns;History of Falls;Right Knee Replacement;Arthritis;Back Problems;Assistive Device    Cardiac Risk Stratification High   <5 METs on            6 Minute Walk:  6 Minute Walk     Row Name 05/13/23 0800         6 Minute Walk   Phase Initial     Distance 874 feet  Used Cane     Walk Time 6 minutes     # of Rest Breaks 1  3:00-4:10, stopped due to HR  130. resumed once within target     MPH 1.7     METS 2.1     RPE 13     Perceived Dyspnea  1.5     VO2 Peak 7.23     Symptoms Yes (comment)     Comments SOB,  RPD = 1.5. Resolved with rest. No pain     Resting HR 78 bpm     Resting BP 122/84     Resting Oxygen Saturation  98 %     Exercise Oxygen Saturation  during 6 min walk 100 %     Max Ex. HR 137 bpm     Max Ex. BP 156/76     2 Minute Post BP 128/74              Oxygen Initial Assessment:   Oxygen Re-Evaluation:   Oxygen Discharge (Final Oxygen Re-Evaluation):   Initial Exercise Prescription:  Initial Exercise Prescription - 05/13/23 1000       Date of Initial Exercise RX and Referring Provider   Date 05/13/23    Referring Provider Sherryl Manges, MD    Expected Discharge Date 07/23/23      NuStep   Level 1    SPM 85    Minutes 15    METs 2      Prescription Details   Frequency (times per week) 3    Duration Progress to 30 minutes of continuous aerobic without signs/symptoms of physical distress      Intensity   THRR 40-80% of Max Heartrate 57-114    Ratings of Perceived Exertion 11-13    Perceived Dyspnea 0-4      Progression   Progression Continue progressive overload as per policy without signs/symptoms or physical distress.      Resistance Training   Training Prescription Yes    Weight 2    Reps 10-15             Perform Capillary Blood Glucose checks as needed.  Exercise Prescription Changes:   Exercise Prescription Changes     Row Name 05/17/23 1600 05/31/23 1030 06/18/23 1400 07/09/23 1500 07/23/23 1100     Response to Exercise   Blood Pressure (Admit) 140/74 120/70 130/62 120/64 140/70   Blood Pressure (Exercise) 138/74 168/72 158/62 150/78 158/80   Blood Pressure (Exit) 114/74 128/64 124/70 124/76 112/76   Heart Rate (Admit) 70 bpm 65 bpm 62 bpm 83 bpm 66 bpm   Heart Rate (Exercise) 98 bpm 106 bpm 108 bpm 107 bpm 106 bpm   Heart Rate (Exit) 67 bpm 67 bpm 67 bpm 76 bpm 75 bpm   Rating of Perceived Exertion (Exercise) 9 11.5 11 12 12    Symptoms None None None None None   Comments OPt's first day in the CRP2 program Reviewed METs  Reviewed METs, goals, home exercise Rx Reviewed METs Reviewed METs/goals   Duration Continue with 30 min of aerobic exercise without signs/symptoms of physical distress. Continue with 30 min of aerobic exercise without signs/symptoms of physical distress. Continue with 30 min of aerobic exercise without signs/symptoms of physical distress. Continue with 30 min of aerobic exercise without signs/symptoms of physical distress. Continue with 30 min of aerobic exercise without signs/symptoms of physical distress.   Intensity THRR unchanged THRR unchanged THRR unchanged THRR unchanged THRR unchanged     Progression   Progression Continue to progress workloads to maintain intensity without signs/symptoms of physical distress. Continue to progress workloads to maintain intensity without signs/symptoms of physical distress. Continue  to progress workloads to maintain intensity without signs/symptoms of physical distress. Continue to progress workloads to maintain intensity without signs/symptoms of physical distress. Continue to progress workloads to maintain intensity without signs/symptoms of physical distress.   Average METs 1.7 1.9 2.5 2.7 2.7     Resistance Training   Training Prescription Yes Yes Yes Yes Yes   Weight 2 2 2  lbs 2 lbs 2 lbs   Reps 10-15 10-15 10-15 10-15 10-15   Time 10 Minutes 10 Minutes 10 Minutes 10 Minutes 10 Minutes     Interval Training   Interval Training No No No No No     NuStep   Level 1 1 4 4 5    SPM 81 121 125 142 131   Minutes 15 30 30 30 30    METs 1.7 1.9 2.5 2.7 2.7     Home Exercise Plan   Plans to continue exercise at -- -- [employer] (comment) [employer] (comment) [employer] (comment)   Frequency -- -- Add 2 additional days to program exercise sessions. Add 2 additional days to program exercise sessions. Add 2 additional days to program exercise sessions.   Initial Home Exercises Provided -- -- 06/18/23 06/18/23 06/18/23             Exercise Comments:   Exercise Comments     Row Name 05/17/23 1622 05/31/23 1030 06/18/23 1420 07/09/23 1600 07/23/23 1204   Exercise Comments Pt's firs day in the cRP2 program. Pt is off to a good start. No complaints today. Reviewed METs. Pt is making good progress. Reviewed METs, goals and home exercise Rx. Pt voices she has access to [employer]. Encouraged patient to get signed up at the [facility] for the program and start 2x/week using the nustep for 30 minutes. Pt agrees to begin this process. Pt verbalized undersntading of the home exercise Rx and was provided a copy. reviewed METs. Pt is making good progress. Pt will increase workload on nustep next session. Pt voices that her knees are better and her legs are stronger. Pt voices she is using her cane less. Reviewed METs and goals. Pt continues to make progress toward her goals.            Exercise Goals and Review:   Exercise Goals     Row Name 05/13/23 0754             Exercise Goals   Increase Physical Activity Yes       Intervention Provide advice, education, support and counseling about physical activity/exercise needs.;Develop an individualized exercise prescription for aerobic and resistive training based on initial evaluation findings, risk stratification, comorbidities and participant's personal goals.       Expected Outcomes Short Term: Attend rehab on a regular basis to increase amount of physical activity.;Long Term: Exercising regularly at least 3-5 days a week.;Long Term: Add in home exercise to make exercise part of routine and to increase amount of physical activity.       Increase Strength and Stamina Yes       Intervention Provide advice, education, support and counseling about physical activity/exercise needs.;Develop an individualized exercise prescription for aerobic and resistive training based on initial evaluation findings, risk stratification, comorbidities and participant's personal goals.        Expected Outcomes Short Term: Increase workloads from initial exercise prescription for resistance, speed, and METs.;Short Term: Perform resistance training exercises routinely during rehab and add in resistance training at home;Long Term: Improve cardiorespiratory fitness, muscular endurance and strength  as measured by increased METs and functional capacity ( )       Able to understand and use rate of perceived exertion (RPE) scale Yes       Intervention Provide education and explanation on how to use RPE scale       Expected Outcomes Short Term: Able to use RPE daily in rehab to express subjective intensity level;Long Term:  Able to use RPE to guide intensity level when exercising independently       Able to understand and use Dyspnea scale Yes       Intervention Provide education and explanation on how to use Dyspnea scale       Expected Outcomes Short Term: Able to use Dyspnea scale daily in rehab to express subjective sense of shortness of breath during exertion;Long Term: Able to use Dyspnea scale to guide intensity level when exercising independently       Knowledge and understanding of Target Heart Rate Range (THRR) Yes       Intervention Provide education and explanation of THRR including how the numbers were predicted and where they are located for reference       Expected Outcomes Short Term: Able to state/look up THRR;Short Term: Able to use daily as guideline for intensity in rehab;Long Term: Able to use THRR to govern intensity when exercising independently       Understanding of Exercise Prescription Yes       Intervention Provide education, explanation, and written materials on patient's individual exercise prescription       Expected Outcomes Short Term: Able to explain program exercise prescription;Long Term: Able to explain home exercise prescription to exercise independently                Exercise Goals Re-Evaluation :  Exercise Goals Re-Evaluation     Row Name 05/17/23  1621 06/18/23 1417 07/23/23 1202         Exercise Goal Re-Evaluation   Exercise Goals Review Increase Physical Activity;Increase Strength and Stamina;Able to understand and use rate of perceived exertion (RPE) scale;Knowledge and understanding of Target Heart Rate Range (THRR);Understanding of Exercise Prescription Increase Physical Activity;Increase Strength and Stamina;Able to understand and use rate of perceived exertion (RPE) scale;Knowledge and understanding of Target Heart Rate Range (THRR);Understanding of Exercise Prescription Increase Physical Activity;Increase Strength and Stamina;Able to understand and use rate of perceived exertion (RPE) scale;Knowledge and understanding of Target Heart Rate Range (THRR);Understanding of Exercise Prescription     Comments Pt's first day in the cRP2 program. Pt understands the exercise Rx, RPE scale and THRR. Reviewed METs, goals and home exercise Rx. Pt is making good progress with peak METs of 2.5. In regards to pt goals: Pt vocies improvement in strength and stamina; is moving better but is still bothered by her knee; she voices less SOB with activity; voices thst she needs to use the chair less during warm-up/cool-down and weights; voices she is able to do more syuff around the house again. Reviewed METs and goals. Pt ia making good progress on her goals of increased strength and stamina. She voices she is moving easier and has not ben using her cane. In regards to her wieight loss goal pt has lost 3.3 kg. Pt' SOB has imrpoved but still gets "winded" sometimes, but she voices its much improved.     Expected Outcomes Will continue to monitor patient and progress exercise workloads as tolerated. Will continue to monitor patient and progress exercise workloads as tolerated. Will continue to monitor patient and  progress exercise workloads as tolerated.              Discharge Exercise Prescription (Final Exercise Prescription Changes):  Exercise  Prescription Changes - 07/23/23 1100       Response to Exercise   Blood Pressure (Admit) 140/70    Blood Pressure (Exercise) 158/80    Blood Pressure (Exit) 112/76    Heart Rate (Admit) 66 bpm    Heart Rate (Exercise) 106 bpm    Heart Rate (Exit) 75 bpm    Rating of Perceived Exertion (Exercise) 12    Symptoms None    Comments Reviewed METs/goals    Duration Continue with 30 min of aerobic exercise without signs/symptoms of physical distress.    Intensity THRR unchanged      Progression   Progression Continue to progress workloads to maintain intensity without signs/symptoms of physical distress.    Average METs 2.7      Resistance Training   Training Prescription Yes    Weight 2 lbs    Reps 10-15    Time 10 Minutes      Interval Training   Interval Training No      NuStep   Level 5    SPM 131    Minutes 30    METs 2.7      Home Exercise Plan   Plans to continue exercise at [facility] (comment)    Frequency Add 2 additional days to program exercise sessions.    Initial Home Exercises Provided 06/18/23             Nutrition:  Target Goals: Understanding of nutrition guidelines, daily intake of sodium 1500mg , cholesterol 200mg , calories 30% from fat and 7% or less from saturated fats, daily to have 5 or more servings of fruits and vegetables.  Biometrics:  Pre Biometrics - 05/13/23 0753       Pre Biometrics   Waist Circumference 46.25 inches    Hip Circumference 50.5 inches    Waist to Hip Ratio 0.92 %    Triceps Skinfold 35 mm    % Body Fat 47 %    Grip Strength 16 kg    Flexibility 10.5 in    Single Leg Stand 3.5 seconds              Nutrition Therapy Plan and Nutrition Goals:  Nutrition Therapy & Goals - 07/14/23 0927       Nutrition Therapy   Diet Heart healthy diet    Drug/Food Interactions Statins/Certain Fruits      Personal Nutrition Goals   Nutrition Goal Patient to identify strategies for reducing cardiovascular risk by  attending the [employer] education and nutrition series weekly.   goal in action.   Personal Goal #2 Patient to improve diet quality by using the plate method as a guide for meal planning to include lean protein/plant protein, fruits, vegetables, whole grains, nonfat dairy as part of a well-balanced diet.   goal in action.   Comments Goals in action. Taraja continues to attend the [employer] and nutrition series regularly. She is down 6.8# since starting with our program. She continues to work on heart healthy lifestyle changes including reduced sodium intake and increased high fiber intake. Patient will benefit from participation in intensive cardiac rehab for nutrition, exercise, and lifestyle modification.      Intervention Plan   Intervention Prescribe, educate and counsel regarding individualized specific dietary modifications aiming towards targeted core components such as weight, hypertension, lipid management, diabetes, heart failure  and other comorbidities.;Nutrition handout(s) given to patient.    Expected Outcomes Short Term Goal: Understand basic principles of dietary content, such as calories, fat, sodium, cholesterol and nutrients.;Long Term Goal: Adherence to prescribed nutrition plan.             Nutrition Assessments:  Nutrition Assessments - 05/17/23 1144       Rate Your Plate Scores   Pre Score 63            MEDIFICTS Score Key: ?70 Need to make dietary changes  40-70 Heart Healthy Diet ? 40 Therapeutic Level Cholesterol Diet   Flowsheet Row INTENSIVE CARDIAC REHAB from 05/17/2023 in [facility] for Heart, Vascular, & Lung Health  Picture Your Plate Total Score on Admission 63      Picture Your Plate Scores: <34 Unhealthy dietary pattern with much room for improvement. 41-50 Dietary pattern unlikely to meet recommendations for good health and room for improvement. 51-60 More healthful dietary pattern, with some room for  improvement.  >60 Healthy dietary pattern, although there may be some specific behaviors that could be improved.    Nutrition Goals Re-Evaluation:  Nutrition Goals Re-Evaluation     Row Name 05/17/23 [phone] 06/17/23 1503 07/14/23 0927         Goals   Current Weight 191 lb 2.2 oz (86.7 kg) 188 lb 7.9 oz (85.5 kg) 184 lb 4.9 oz (83.6 kg)     Comment A1c 5.9, lipids WNL no new labs; most recent labs A1c 5.9, lipids WNL no new labs; most recent labs A1c 5.9, lipids WNL     Expected Outcome Kelcy's blood sugar is well controlled with diet and metformin. Patient will benefit from participation in intensive cardiac rehab for nutrition, exercise, and lifestyle modification. Goals in progress. Dutchess continues to attend the [employer] and nutrition series regularly. She is down 2.6# since starting with our program. Patient will benefit from participation in intensive cardiac rehab for nutrition, exercise, and lifestyle modification. Goals in action. Eather continues to attend the [employer] and nutrition series regularly. She is down 6.8# since starting with our program. She continues to work on heart healthy lifestyle changes including reduced sodium intake and increased high fiber intake. Patient will benefit from participation in intensive cardiac rehab for nutrition, exercise, and lifestyle modification.              Nutrition Goals Re-Evaluation:  Nutrition Goals Re-Evaluation     Row Name 05/17/23 [phone] 06/17/23 1503 07/14/23 0927         Goals   Current Weight 191 lb 2.2 oz (86.7 kg) 188 lb 7.9 oz (85.5 kg) 184 lb 4.9 oz (83.6 kg)     Comment A1c 5.9, lipids WNL no new labs; most recent labs A1c 5.9, lipids WNL no new labs; most recent labs A1c 5.9, lipids WNL     Expected Outcome Lizza's blood sugar is well controlled with diet and metformin. Patient will benefit from participation in intensive cardiac rehab for nutrition, exercise, and lifestyle modification. Goals in  progress. Helina continues to attend the [employer] and nutrition series regularly. She is down 2.6# since starting with our program. Patient will benefit from participation in intensive cardiac rehab for nutrition, exercise, and lifestyle modification. Goals in action. Kasarah continues to attend the [employer] and nutrition series regularly. She is down 6.8# since starting with our program. She continues to work on heart healthy lifestyle changes including reduced sodium intake and increased high fiber  intake. Patient will benefit from participation in intensive cardiac rehab for nutrition, exercise, and lifestyle modification.              Nutrition Goals Discharge (Final Nutrition Goals Re-Evaluation):  Nutrition Goals Re-Evaluation - 07/14/23 0927       Goals   Current Weight 184 lb 4.9 oz (83.6 kg)    Comment no new labs; most recent labs A1c 5.9, lipids WNL    Expected Outcome Goals in action. Kuulei continues to attend the [employer] and nutrition series regularly. She is down 6.8# since starting with our program. She continues to work on heart healthy lifestyle changes including reduced sodium intake and increased high fiber intake. Patient will benefit from participation in intensive cardiac rehab for nutrition, exercise, and lifestyle modification.             Psychosocial: Target Goals: Acknowledge presence or absence of significant depression and/or stress, maximize coping skills, provide positive support system. Participant is able to verbalize types and ability to use techniques and skills needed for reducing stress and depression.  Initial Review & Psychosocial Screening:  Initial Psych Review & Screening - 06/01/23 1053       Initial Review   Current issues with Current Stress Concerns    Source of Stress Concerns Family    Comments Tephanie and her husband help take care of her tow granchildren as her daughter has bipolar disorder. Dionne's  granson is Autistic he is able to to live independantly with supervision      Family Dynamics   Good Support System? Yes   Rhealynn has her husband and son who lives in [place] for support     Barriers   Psychosocial barriers to participate in program The patient should benefit from training in stress management and relaxation.      Screening Interventions   Interventions To provide support and resources with identified psychosocial needs    Expected Outcomes Long Term Goal: Stressors or current issues are controlled or eliminated.             Quality of Life Scores:  Quality of Life - 07/26/23 0922       Quality of Life Scores   Health/Function Pre 22.7 %    Health/Function Post 23.5 %    Health/Function % Change 3.52 %    Socioeconomic Pre 25.07 %    Socioeconomic Post 27.43 %    Socioeconomic % Change  9.41 %    Psych/Spiritual Pre 24.86 %    Psych/Spiritual Post 25.71 %    Psych/Spiritual % Change 3.42 %    Family Pre 22.5 %    Family Post 25.2 %    Family % Change 12 %    GLOBAL Pre 23.6 %    GLOBAL Post 25.01 %    GLOBAL % Change 5.97 %            Scores of 19 and below usually indicate a poorer quality of life in these areas.  A difference of  2-3 points is a clinically meaningful difference.  A difference of 2-3 points in the total score of the Quality of Life Index has been associated with significant improvement in overall quality of life, self-image, physical symptoms, and general health in studies assessing change in quality of life.  PHQ-9: Review Flowsheet       05/13/2023  Depression screen PHQ 2/9  Decreased Interest 0  Down, Depressed, Hopeless 0  PHQ - 2 Score 0  Altered  sleeping 0  Tired, decreased energy 0  Change in appetite 0  Feeling bad or failure about yourself  0  Trouble concentrating 0  Moving slowly or fidgety/restless 0  Suicidal thoughts 0  PHQ-9 Score 0    Details           Interpretation of Total Score  Total  Score Depression Severity:  1-4 = Minimal depression, 5-9 = Mild depression, 10-14 = Moderate depression, 15-19 = Moderately severe depression, 20-27 = Severe depression   Psychosocial Evaluation and Intervention:   Psychosocial Re-Evaluation:  Psychosocial Re-Evaluation     Row Name 06/01/23 1120 06/29/23 1747 07/28/23 1246         Psychosocial Re-Evaluation   Current issues with Current Stress Concerns Current Stress Concerns Current Stress Concerns     Comments Macelynn has not voiced any further concerns or stressors. Karmen partcipates in the  care of her two grandsons. Shakura's granchildren are adults and are self sufficient. Lonette says this sometimes is a stressor for her Dariya partcipates in the  care of her two grandsons. Jamese's granchildren are adults and are self sufficient. Jozy says this sometimes is a stressor for her. Monchel will complete cardiac rehab on 08/04/23     Expected Outcomes -- -- Kopelyn says that participating in cardiac rehab was helpful for her.     Interventions Encouraged to attend Cardiac Rehabilitation for the exercise;Stress management education Encouraged to attend Cardiac Rehabilitation for the exercise;Stress management education Encouraged to attend Cardiac Rehabilitation for the exercise;Stress management education     Continue Psychosocial Services  Follow up required by staff No Follow up required No Follow up required       Initial Review   Source of Stress Concerns Family Family Family     Comments Will coninute to moniotr and offer support as needed. Will coninute to moniotr and offer support as needed. Will coninute to moniotr and offer support as needed.              Psychosocial Discharge (Final Psychosocial Re-Evaluation):  Psychosocial Re-Evaluation - 07/28/23 1246       Psychosocial Re-Evaluation   Current issues with Current Stress Concerns    Comments Rhys partcipates in the  care of her two grandsons. Forest's granchildren  are adults and are self sufficient. Hazzel says this sometimes is a stressor for her. Oliva will complete cardiac rehab on 08/04/23    Expected Outcomes Wilva says that participating in cardiac rehab was helpful for her.    Interventions Encouraged to attend Cardiac Rehabilitation for the exercise;Stress management education    Continue Psychosocial Services  No Follow up required      Initial Review   Source of Stress Concerns Family    Comments Will coninute to moniotr and offer support as needed.             Vocational Rehabilitation: Provide vocational rehab assistance to qualifying candidates.   Vocational Rehab Evaluation & Intervention:  Vocational Rehab - 05/13/23 0847       Initial Vocational Rehab Evaluation & Intervention   Assessment shows need for Vocational Rehabilitation No   Laurel works full time and does not need vocational rehab at this time            Education: Education Goals: Education classes will be provided on a weekly basis, covering required topics. Participant will state understanding/return demonstration of topics presented.    Education     Row Name 05/17/23 1000  Education   Cardiac Education Topics Pritikin   Geographical information systems officer Psychosocial   Psychosocial Workshop From Head to Heart: The Power of a Healthy Outlook   Instruction Review Code 1- Verbalizes Understanding   Class Start Time 0815   Class Stop Time 0903   Class Time Calculation (min) 48 min    Row Name 05/19/23 0900     Education   Cardiac Education Topics Pritikin   Secondary school teacher School   Educator Dietitian   Weekly Topic Powerhouse Plant-Based Proteins   Instruction Review Code 1- Verbalizes Understanding   Class Start Time [phone]   Class Stop Time 0845   Class Time Calculation (min) 35 min    Row Name 05/26/23 1100     Education   Cardiac Education Topics Pritikin   Building surveyor School   Educator Dietitian   Weekly Topic Adding Flavor - Sodium-Free   Instruction Review Code 1- Verbalizes Understanding   Class Start Time [phone]   Class Stop Time 0845   Class Time Calculation (min) 33 min    Row Name 05/28/23 1400     Education   Cardiac Education Topics Pritikin   Psychologist, forensic General Education   General Education Heart Disease Risk Reduction   Instruction Review Code 1- Verbalizes Understanding   Class Start Time 0810   Class Stop Time 0849   Class Time Calculation (min) 39 min    Row Name 05/31/23 0900     Education   Cardiac Education Topics Pritikin   [employer] Workshops     Workshops   Educator Exercise Physiologist   Select Exercise   Exercise Workshop Location manager and Fall Prevention   Instruction Review Code 1- Verbalizes Understanding   Class Start Time [phone]   Class Stop Time 0855   Class Time Calculation (min) 36 min    Row Name 06/02/23 0900     Education   Cardiac Education Topics Pritikin   Secondary school teacher School   Educator Dietitian   Weekly Topic Fast and Healthy Breakfasts   Instruction Review Code 1- Verbalizes Understanding   Class Start Time 0815   Class Stop Time 0848   Class Time Calculation (min) 33 min    Row Name 06/04/23 0900     Education   Cardiac Education Topics Pritikin   Select Core Videos     Core Videos   Educator Dietitian   Select Nutrition   Nutrition Overview of the Pritikin Eating Plan   Instruction Review Code 1- Verbalizes Understanding   Class Start Time 0813   Class Stop Time 0852   Class Time Calculation (min) 39 min    Row Name 06/07/23 1100     Education   Cardiac Education Topics Pritikin   [employer] Workshops     Workshops   Educator Exercise Physiologist   Select Psychosocial   Psychosocial Workshop Recognizing and Reducing Stress   Instruction Review Code 1-  Verbalizes Understanding   Class Start Time [phone]   Class Stop Time 0847   Class Time Calculation (min) 35 min    Row Name 06/09/23 1000     Education   Cardiac Education Topics Pritikin   [employer]  Educator Dietitian   Weekly Topic Personalizing Your Pritikin Plate   Instruction Review Code 1- Verbalizes Understanding   Class Start Time 0815   Class Stop Time [phone]   Class Time Calculation (min) 37 min    Row Name 06/11/23 0900     Education   Cardiac Education Topics Pritikin   Select Core Videos     Core Videos   Educator Exercise Physiologist   Select Psychosocial   Psychosocial Healthy Minds, Bodies, Hearts   Instruction Review Code 1- Verbalizes Understanding   Class Start Time 0815   Class Stop Time 0850   Class Time Calculation (min) 35 min    Row Name 06/14/23 0900     Education   Cardiac Education Topics Pritikin   Glass blower/designer Nutrition   Nutrition Workshop Label Reading   Instruction Review Code 1- Verbalizes Understanding   Class Start Time 0815   Class Stop Time 0858   Class Time Calculation (min) 43 min    Row Name 06/18/23 0900     Education   Cardiac Education Topics Pritikin   Select Core Videos     Core Videos   Educator Dietitian   Nutrition Other  label reading   Instruction Review Code 1- Verbalizes Understanding   Class Start Time 0810   Class Stop Time 0852   Class Time Calculation (min) 42 min    Row Name 06/21/23 0900     Education   Cardiac Education Topics Pritikin   [employer] Workshops     Workshops   Educator Exercise Physiologist   Select Exercise   Exercise Workshop Exercise Basics: Building Your Action Plan   Instruction Review Code 1- Verbalizes Understanding   Class Start Time 0808   Class Stop Time 0854   Class Time Calculation (min) 46 min    Row Name 06/23/23 0900     Education   Cardiac Education Topics Pritikin   Building surveyor School   Educator Dietitian   Weekly Topic Tasty Appetizers and Snacks   Instruction Review Code 1- Verbalizes Understanding   Class Start Time [phone]   Class Stop Time 0845   Class Time Calculation (min) 31 min    Row Name 06/25/23 0800     Education   Cardiac Education Topics Pritikin   Select Core Videos     Core Videos   Educator Exercise Physiologist   Select Nutrition   Nutrition Nutrition Action Plan   Instruction Review Code 1- Verbalizes Understanding   Class Start Time 0813   Class Stop Time 0843   Class Time Calculation (min) 30 min    Row Name 06/30/23 0800     Education   Cardiac Education Topics Pritikin   Secondary school teacher School   Educator Dietitian   Weekly Topic Efficiency Cooking - Meals in a Snap   Instruction Review Code 1- Verbalizes Understanding   Class Start Time 0815   Class Stop Time 0850   Class Time Calculation (min) 35 min    Row Name 07/02/23 0800     Education   Cardiac Education Topics Pritikin   Psychologist, forensic Exercise Education   Exercise Education Move It!   Instruction Review Code 1- Verbalizes Understanding   Class Start Time [phone]   Class Stop Time  1610   Class Time Calculation (min) 33 min    Row Name 07/05/23 0900     Education   Cardiac Education Topics Pritikin   [employer] Workshops     Workshops   Educator Exercise Physiologist   Select Psychosocial   Psychosocial Workshop Focused Goals, Sustainable Changes   Instruction Review Code 1- Verbalizes Understanding   Class Start Time [phone]   Class Stop Time 0845   Class Time Calculation (min) 35 min    Row Name 07/07/23 0800     Education   Cardiac Education Topics Pritikin   Secondary school teacher School   Educator Dietitian   Weekly Topic One-Pot Wonders   Instruction Review Code 1- Verbalizes Understanding   Class Start Time [phone]   Class  Stop Time [phone]   Class Time Calculation (min) 32 min    Row Name 07/09/23 0800     Education   Cardiac Education Topics Pritikin   Select Core Videos     Core Videos   Educator Exercise Physiologist   Select General Education   General Education Hypertension and Heart Disease   Instruction Review Code 1- Verbalizes Understanding   Class Start Time 0815   Class Stop Time 0850   Class Time Calculation (min) 35 min    Row Name 07/12/23 0800     Education   Cardiac Education Topics Pritikin   Select Core Videos     Core Videos   Educator Exercise Physiologist   Select Exercise Education   Exercise Education Biomechanial Limitations   Instruction Review Code 1- Verbalizes Understanding   Class Start Time 0810   Class Stop Time 0846   Class Time Calculation (min) 36 min    Row Name 07/14/23 1000     Education   Cardiac Education Topics Pritikin   Orthoptist   Educator Dietitian   Weekly Topic Comforting Weekend Breakfasts   Instruction Review Code 1- Verbalizes Understanding   Class Start Time 0815   Class Stop Time 0846   Class Time Calculation (min) 31 min    Row Name 07/19/23 0900     Education   Cardiac Education Topics Pritikin   Select Core Videos     Core Videos   Educator Exercise Physiologist   Select Nutrition   Nutrition Facts on Fat   Instruction Review Code 1- Verbalizes Understanding   Class Start Time [phone]   Class Stop Time 0848   Class Time Calculation (min) 32 min    Row Name 07/21/23 1000     Education   Cardiac Education Topics Pritikin   Secondary school teacher School   Educator Dietitian   Weekly Topic Fast Evening Meals   Instruction Review Code 1- Verbalizes Understanding   Class Start Time [phone]   Class Stop Time 0845   Class Time Calculation (min) 33 min    Row Name 07/23/23 0900     Education   Cardiac Education Topics Pritikin   Select Core Videos     Core Videos   Educator  Dietitian   Select Nutrition   Nutrition Vitamins and Minerals   Instruction Review Code 1- Verbalizes Understanding   Class Start Time 0818   Class Stop Time 0857   Class Time Calculation (min) 39 min    Row Name 07/26/23 1100     Education   Cardiac Education Topics Pritikin   [employer]  Workshops   Biomedical scientist Psychosocial   Psychosocial Workshop Healthy Sleep for a Healthy Heart   Instruction Review Code 1- Verbalizes Understanding   Class Start Time [phone]   Class Stop Time 0900   Class Time Calculation (min) 50 min            Core Videos: Exercise    Move It!  Clinical staff conducted group or individual video education with verbal and written material and guidebook.  Patient learns the recommended Pritikin exercise program. Exercise with the goal of living a long, healthy life. Some of the health benefits of exercise include controlled diabetes, healthier blood pressure levels, improved cholesterol levels, improved heart and lung capacity, improved sleep, and better body composition. Everyone should speak with their doctor before starting or changing an exercise routine.  Biomechanical Limitations Clinical staff conducted group or individual video education with verbal and written material and guidebook.  Patient learns how biomechanical limitations can impact exercise and how we can mitigate and possibly overcome limitations to have an impactful and balanced exercise routine.  Body Composition Clinical staff conducted group or individual video education with verbal and written material and guidebook.  Patient learns that body composition (ratio of muscle mass to fat mass) is a key component to assessing overall fitness, rather than body weight alone. Increased fat mass, especially visceral belly fat, can put [place] at increased risk for metabolic syndrome, type 2 diabetes, heart disease, and even death. It is recommended to combine diet  and exercise (cardiovascular and resistance training) to improve your body composition. Seek guidance from your physician and exercise physiologist before implementing an exercise routine.  Exercise Action Plan Clinical staff conducted group or individual video education with verbal and written material and guidebook.  Patient learns the recommended strategies to achieve and enjoy long-term exercise adherence, including variety, self-motivation, self-efficacy, and positive decision making. Benefits of exercise include fitness, good health, weight management, more energy, better sleep, less stress, and overall well-being.  Medical   Heart Disease Risk Reduction Clinical staff conducted group or individual video education with verbal and written material and guidebook.  Patient learns our heart is our most vital organ as it circulates oxygen, nutrients, white blood cells, and hormones throughout the entire body, and carries waste away. Data supports a plant-based eating plan like the Pritikin Program for its effectiveness in slowing progression of and reversing heart disease. The video provides a number of recommendations to address heart disease.   Metabolic Syndrome and Belly Fat  Clinical staff conducted group or individual video education with verbal and written material and guidebook.  Patient learns what metabolic syndrome is, how it leads to heart disease, and how one can reverse it and keep it from coming back. You have metabolic syndrome if you have 3 of the following 5 criteria: abdominal obesity, high blood pressure, high triglycerides, low HDL cholesterol, and high blood sugar.  Hypertension and Heart Disease Clinical staff conducted group or individual video education with verbal and written material and guidebook.  Patient learns that high blood pressure, or hypertension, is very common in the [place]. Hypertension is largely due to excessive salt intake, but other important risk  factors include being overweight, physical inactivity, drinking too much alcohol, smoking, and not eating enough potassium from fruits and vegetables. High blood pressure is a leading risk factor for heart attack, stroke, congestive heart failure, dementia, kidney failure, and premature death. Long-term effects of excessive salt intake include stiffening of  the arteries and thickening of heart muscle and organ damage. Recommendations include ways to reduce hypertension and the risk of heart disease.  Diseases of Our Time - Focusing on Diabetes Clinical staff conducted group or individual video education with verbal and written material and guidebook.  Patient learns why the best way to stop diseases of our time is prevention, through food and other lifestyle changes. Medicine (such as prescription pills and surgeries) is often only a Band-Aid on the problem, not a long-term solution. Most common diseases of our time include obesity, type 2 diabetes, hypertension, heart disease, and cancer. The Pritikin Program is recommended and has been proven to help reduce, reverse, and/or prevent the damaging effects of metabolic syndrome.  Nutrition   Overview of the Pritikin Eating Plan  Clinical staff conducted group or individual video education with verbal and written material and guidebook.  Patient learns about the Pritikin Eating Plan for disease risk reduction. The Pritikin Eating Plan emphasizes a wide variety of unrefined, minimally-processed carbohydrates, like fruits, vegetables, whole grains, and legumes. Go, Caution, and Stop food choices are explained. Plant-based and lean animal proteins are emphasized. Rationale provided for low sodium intake for blood pressure control, low added sugars for blood sugar stabilization, and low added fats and oils for coronary artery disease risk reduction and weight management.  Calorie Density  Clinical staff conducted group or individual video education with verbal  and written material and guidebook.  Patient learns about calorie density and how it impacts the Pritikin Eating Plan. Knowing the characteristics of the food you choose will help you decide whether those foods will lead to weight gain or weight loss, and whether you want to consume more or less of them. Weight loss is usually a side effect of the Pritikin Eating Plan because of its focus on low calorie-dense foods.  Label Reading  Clinical staff conducted group or individual video education with verbal and written material and guidebook.  Patient learns about the Pritikin recommended label reading guidelines and corresponding recommendations regarding calorie density, added sugars, sodium content, and whole grains.  Dining Out - Part 1  Clinical staff conducted group or individual video education with verbal and written material and guidebook.  Patient learns that restaurant meals can be sabotaging because they can be so high in calories, fat, sodium, and/or sugar. Patient learns recommended strategies on how to positively address this and avoid unhealthy pitfalls.  Facts on Fats  Clinical staff conducted group or individual video education with verbal and written material and guidebook.  Patient learns that lifestyle modifications can be just as effective, if not more so, as many medications for lowering your risk of heart disease. A Pritikin lifestyle can help to reduce your risk of inflammation and atherosclerosis (cholesterol build-up, or plaque, in the artery walls). Lifestyle interventions such as dietary choices and physical activity address the cause of atherosclerosis. A review of the types of fats and their impact on blood cholesterol levels, along with dietary recommendations to reduce fat intake is also included.  Nutrition Action Plan  Clinical staff conducted group or individual video education with verbal and written material and guidebook.  Patient learns how to incorporate Pritikin  recommendations into their lifestyle. Recommendations include planning and keeping personal health goals in mind as an important part of their success.  Healthy Mind-Set    Healthy Minds, Bodies, Hearts  Clinical staff conducted group or individual video education with verbal and written material and guidebook.  Patient learns how to identify  when they are stressed. Video will discuss the impact of that stress, as well as the many benefits of stress management. Patient will also be introduced to stress management techniques. The way we think, act, and feel has an impact on our hearts.  How Our Thoughts Can Heal Our Hearts  Clinical staff conducted group or individual video education with verbal and written material and guidebook.  Patient learns that negative thoughts can cause depression and anxiety. This can result in negative lifestyle behavior and serious health problems. Cognitive behavioral therapy is an effective method to help control our thoughts in order to change and improve our emotional outlook.  Additional Videos:  Exercise    Improving Performance  Clinical staff conducted group or individual video education with verbal and written material and guidebook.  Patient learns to use a non-linear approach by alternating intensity levels and lengths of time spent exercising to help burn more calories and lose more body fat. Cardiovascular exercise helps improve heart health, metabolism, hormonal balance, blood sugar control, and recovery from fatigue. Resistance training improves strength, endurance, balance, coordination, reaction time, metabolism, and muscle mass. Flexibility exercise improves circulation, posture, and balance. Seek guidance from your physician and exercise physiologist before implementing an exercise routine and learn your capabilities and proper form for all exercise.  Introduction to Yoga  Clinical staff conducted group or individual video education with verbal and  written material and guidebook.  Patient learns about yoga, a discipline of the coming together of mind, breath, and body. The benefits of yoga include improved flexibility, improved range of motion, better posture and core strength, increased lung function, weight loss, and positive self-image. Yoga's heart health benefits include lowered blood pressure, healthier heart rate, decreased cholesterol and triglyceride levels, improved immune function, and reduced stress. Seek guidance from your physician and exercise physiologist before implementing an exercise routine and learn your capabilities and proper form for all exercise.  Medical   Aging: Enhancing Your Quality of Life  Clinical staff conducted group or individual video education with verbal and written material and guidebook.  Patient learns key strategies and recommendations to stay in good physical health and enhance quality of life, such as prevention strategies, having an advocate, securing a Health Care Proxy and Power of Attorney, and keeping a list of medications and system for tracking them. It also discusses how to avoid risk for bone loss.  Biology of Weight Control  Clinical staff conducted group or individual video education with verbal and written material and guidebook.  Patient learns that weight gain occurs because we consume more calories than we burn (eating more, moving less). Even if your body weight is normal, you may have higher ratios of fat compared to muscle mass. Too much body fat puts you at increased risk for cardiovascular disease, heart attack, stroke, type 2 diabetes, and obesity-related cancers. In addition to exercise, following the Pritikin Eating Plan can help reduce your risk.  Decoding Lab Results  Clinical staff conducted group or individual video education with verbal and written material and guidebook.  Patient learns that lab test reflects one measurement whose values change over time and are influenced  by many factors, including medication, stress, sleep, exercise, food, hydration, pre-existing medical conditions, and more. It is recommended to use the knowledge from this video to become more involved with your lab results and evaluate your numbers to speak with your doctor.   Diseases of Our Time - Overview  Clinical staff conducted group or individual video education  with verbal and written material and guidebook.  Patient learns that according to the CDC, 50% to 70% of chronic diseases (such as obesity, type 2 diabetes, elevated lipids, hypertension, and heart disease) are avoidable through lifestyle improvements including healthier food choices, listening to satiety cues, and increased physical activity.  Sleep Disorders Clinical staff conducted group or individual video education with verbal and written material and guidebook.  Patient learns how good quality and duration of sleep are important to overall health and well-being. Patient also learns about sleep disorders and how they impact health along with recommendations to address them, including discussing with a physician.  Nutrition  Dining Out - Part 2 Clinical staff conducted group or individual video education with verbal and written material and guidebook.  Patient learns how to plan ahead and communicate in order to maximize their dining experience in a healthy and nutritious manner. Included are recommended food choices based on the type of restaurant the patient is visiting.   Fueling a Banker conducted group or individual video education with verbal and written material and guidebook.  There is a strong connection between our food choices and our health. Diseases like obesity and type 2 diabetes are very prevalent and are in large-part due to lifestyle choices. The Pritikin Eating Plan provides plenty of food and hunger-curbing satisfaction. It is easy to follow, affordable, and helps reduce health  risks.  Menu Workshop  Clinical staff conducted group or individual video education with verbal and written material and guidebook.  Patient learns that restaurant meals can sabotage health goals because they are often packed with calories, fat, sodium, and sugar. Recommendations include strategies to plan ahead and to communicate with the manager, chef, or server to help order a healthier meal.  Planning Your Eating Strategy  Clinical staff conducted group or individual video education with verbal and written material and guidebook.  Patient learns about the Pritikin Eating Plan and its benefit of reducing the risk of disease. The Pritikin Eating Plan does not focus on calories. Instead, it emphasizes high-quality, nutrient-rich foods. By knowing the characteristics of the foods, we choose, we can determine their calorie density and make informed decisions.  Targeting Your Nutrition Priorities  Clinical staff conducted group or individual video education with verbal and written material and guidebook.  Patient learns that lifestyle habits have a tremendous impact on disease risk and progression. This video provides eating and physical activity recommendations based on your personal health goals, such as reducing LDL cholesterol, losing weight, preventing or controlling type 2 diabetes, and reducing high blood pressure.  Vitamins and Minerals  Clinical staff conducted group or individual video education with verbal and written material and guidebook.  Patient learns different ways to obtain key vitamins and minerals, including through a recommended healthy diet. It is important to discuss all supplements you take with your doctor.   Healthy Mind-Set    Smoking Cessation  Clinical staff conducted group or individual video education with verbal and written material and guidebook.  Patient learns that cigarette smoking and tobacco addiction pose a serious health risk which affects millions of  people. Stopping smoking will significantly reduce the risk of heart disease, lung disease, and many forms of cancer. Recommended strategies for quitting are covered, including working with your doctor to develop a successful plan.  Culinary   Becoming a Set designer conducted group or individual video education with verbal and written material and guidebook.  Patient learns that cooking  at home can be healthy, cost-effective, quick, and puts them in control. Keys to cooking healthy recipes will include looking at your recipe, assessing your equipment needs, planning ahead, making it simple, choosing cost-effective seasonal ingredients, and limiting the use of added fats, salts, and sugars.  Cooking - Breakfast and Snacks  Clinical staff conducted group or individual video education with verbal and written material and guidebook.  Patient learns how important breakfast is to satiety and nutrition through the entire day. Recommendations include key foods to eat during breakfast to help stabilize blood sugar levels and to prevent overeating at meals later in the day. Planning ahead is also a key component.  Cooking - Educational psychologist conducted group or individual video education with verbal and written material and guidebook.  Patient learns eating strategies to improve overall health, including an approach to cook more at home. Recommendations include thinking of animal protein as a side on your plate rather than center stage and focusing instead on lower calorie dense options like vegetables, fruits, whole grains, and plant-based proteins, such as beans. Making sauces in large quantities to freeze for later and leaving the skin on your vegetables are also recommended to maximize your experience.  Cooking - Healthy Salads and Dressing Clinical staff conducted group or individual video education with verbal and written material and guidebook.  Patient learns that  vegetables, fruits, whole grains, and legumes are the foundations of the Pritikin Eating Plan. Recommendations include how to incorporate each of these in flavorful and healthy salads, and how to create homemade salad dressings. Proper handling of ingredients is also covered. Cooking - Soups and [employer] - Soups and Desserts Clinical staff conducted group or individual video education with verbal and written material and guidebook.  Patient learns that Pritikin soups and desserts make for easy, nutritious, and delicious snacks and meal components that are low in sodium, fat, sugar, and calorie density, while high in vitamins, minerals, and filling fiber. Recommendations include simple and healthy ideas for soups and desserts.   Overview     The Pritikin Solution Program Overview Clinical staff conducted group or individual video education with verbal and written material and guidebook.  Patient learns that the results of the Pritikin Program have been documented in more than 100 articles published in peer-reviewed journals, and the benefits include reducing risk factors for (and, in some cases, even reversing) high cholesterol, high blood pressure, type 2 diabetes, obesity, and more! An overview of the three key pillars of the Pritikin Program will be covered: eating well, doing regular exercise, and having a healthy mind-set.  WORKSHOPS  Exercise: Exercise Basics: Building Your Action Plan Clinical staff led group instruction and group discussion with PowerPoint presentation and patient guidebook. To enhance the learning environment the use of posters, models and videos may be added. At the conclusion of this workshop, patients will comprehend the difference between physical activity and exercise, as well as the benefits of incorporating both, into their routine. Patients will understand the FITT (Frequency, Intensity, Time, and Type) principle and how to use it to build an exercise action  plan. In addition, safety concerns and other considerations for exercise and cardiac rehab will be addressed by the presenter. The purpose of this lesson is to promote a comprehensive and effective weekly exercise routine in order to improve patients' overall level of fitness.   Managing Heart Disease: Your Path to a Healthier Heart Clinical staff led group instruction and group discussion with  PowerPoint presentation and patient guidebook. To enhance the learning environment the use of posters, models and videos may be added.At the conclusion of this workshop, patients will understand the anatomy and physiology of the heart. Additionally, they will understand how Pritikin's three pillars impact the risk factors, the progression, and the management of heart disease.  The purpose of this lesson is to provide a high-level overview of the heart, heart disease, and how the Pritikin lifestyle positively impacts risk factors.  Exercise Biomechanics Clinical staff led group instruction and group discussion with PowerPoint presentation and patient guidebook. To enhance the learning environment the use of posters, models and videos may be added. Patients will learn how the structural parts of their bodies function and how these functions impact their daily activities, movement, and exercise. Patients will learn how to promote a neutral spine, learn how to manage pain, and identify ways to improve their physical movement in order to promote healthy living. The purpose of this lesson is to expose patients to common physical limitations that impact physical activity. Participants will learn practical ways to adapt and manage aches and pains, and to minimize their effect on regular exercise. Patients will learn how to maintain good posture while sitting, walking, and lifting.  Balance Training and Fall Prevention  Clinical staff led group instruction and group discussion with PowerPoint presentation and  patient guidebook. To enhance the learning environment the use of posters, models and videos may be added. At the conclusion of this workshop, patients will understand the importance of their sensorimotor skills (vision, proprioception, and the vestibular system) in maintaining their ability to balance as they age. Patients will apply a variety of balancing exercises that are appropriate for their current level of function. Patients will understand the common causes for poor balance, possible solutions to these problems, and ways to modify their physical environment in order to minimize their fall risk. The purpose of this lesson is to teach patients about the importance of maintaining balance as they age and ways to minimize their risk of falling.  WORKSHOPS   Nutrition:  Fueling a Ship broker led group instruction and group discussion with PowerPoint presentation and patient guidebook. To enhance the learning environment the use of posters, models and videos may be added. Patients will review the foundational principles of the Pritikin Eating Plan and understand what constitutes a serving size in each of the food groups. Patients will also learn Pritikin-friendly foods that are better choices when away from home and review make-ahead meal and snack options. Calorie density will be reviewed and applied to three nutrition priorities: weight maintenance, weight loss, and weight gain. The purpose of this lesson is to reinforce (in a group setting) the key concepts around what patients are recommended to eat and how to apply these guidelines when away from home by planning and selecting Pritikin-friendly options. Patients will understand how calorie density may be adjusted for different weight management goals.  Mindful Eating  Clinical staff led group instruction and group discussion with PowerPoint presentation and patient guidebook. To enhance the learning environment the use of posters,  models and videos may be added. Patients will briefly review the concepts of the Pritikin Eating Plan and the importance of low-calorie dense foods. The concept of mindful eating will be introduced as well as the importance of paying attention to internal hunger signals. Triggers for non-hunger eating and techniques for dealing with triggers will be explored. The purpose of this lesson is to provide patients with  the opportunity to review the basic principles of the Pritikin Eating Plan, discuss the value of eating mindfully and how to measure internal cues of hunger and fullness using the Hunger Scale. Patients will also discuss reasons for non-hunger eating and learn strategies to use for controlling emotional eating.  Targeting Your Nutrition Priorities Clinical staff led group instruction and group discussion with PowerPoint presentation and patient guidebook. To enhance the learning environment the use of posters, models and videos may be added. Patients will learn how to determine their genetic susceptibility to disease by reviewing their family history. Patients will gain insight into the importance of diet as part of an overall healthy lifestyle in mitigating the impact of genetics and other environmental insults. The purpose of this lesson is to provide patients with the opportunity to assess their personal nutrition priorities by looking at their family history, their own health history and current risk factors. Patients will also be able to discuss ways of prioritizing and modifying the Pritikin Eating Plan for their highest risk areas  Menu  Clinical staff led group instruction and group discussion with PowerPoint presentation and patient guidebook. To enhance the learning environment the use of posters, models and videos may be added. Using menus brought in from [employer], or printed from [employer], patients will apply the Pritikin dining out guidelines that were presented in the  [employer] video. Patients will also be able to practice these guidelines in a variety of provided scenarios. The purpose of this lesson is to provide patients with the opportunity to practice hands-on learning of the Pritikin Dining Out guidelines with actual menus and practice scenarios.  Label Reading Clinical staff led group instruction and group discussion with PowerPoint presentation and patient guidebook. To enhance the learning environment the use of posters, models and videos may be added. Patients will review and discuss the Pritikin label reading guidelines presented in Pritikin's Label Reading Educational series video. Using fool labels brought in from local grocery stores and markets, patients will apply the label reading guidelines and determine if the packaged food meet the Pritikin guidelines. The purpose of this lesson is to provide patients with the opportunity to review, discuss, and practice hands-on learning of the Pritikin Label Reading guidelines with actual packaged food labels. Cooking School  Pritikin's [employer] are designed to teach patients ways to prepare quick, simple, and affordable recipes at home. The importance of nutrition's role in chronic disease risk reduction is reflected in its emphasis in the overall Pritikin program. By learning how to prepare essential core Pritikin Eating Plan recipes, patients will increase control over what they eat; be able to customize the flavor of foods without the use of added salt, sugar, or fat; and improve the quality of the food they consume. By learning a set of core recipes which are easily assembled, quickly prepared, and affordable, patients are more likely to prepare more healthy foods at home. These workshops focus on convenient breakfasts, simple entres, side dishes, and desserts which can be prepared with minimal effort and are consistent with nutrition recommendations for cardiovascular risk  reduction. Cooking [employer] are taught by a Armed forces logistics/support/administrative officer (RD) who has been trained by the [employer]. The chef or RD has a clear understanding of the importance of minimizing - if not completely eliminating - added fat, sugar, and sodium in recipes. Throughout the series of Cooking School Workshop sessions, patients will learn about healthy ingredients and efficient methods  of cooking to build confidence in their capability to prepare    Cooking School weekly topics:  Adding Flavor- Sodium-Free  Fast and Healthy Breakfasts  Powerhouse Plant-Based Proteins  Satisfying Salads and Dressings  Simple Sides and Sauces  International Cuisine-Spotlight on the [employer] Zones  Delicious Desserts  Savory Soups  [employer] - Meals in a Snap  Tasty Appetizers and Snacks  Comforting Weekend Breakfasts  One-Pot Wonders   Fast Evening Meals  Landscape architect Your Pritikin Plate  WORKSHOPS   Healthy Mindset (Psychosocial):  Focused Goals, Sustainable Changes Clinical staff led group instruction and group discussion with PowerPoint presentation and patient guidebook. To enhance the learning environment the use of posters, models and videos may be added. Patients will be able to apply effective goal setting strategies to establish at least one personal goal, and then take consistent, meaningful action toward that goal. They will learn to identify common barriers to achieving personal goals and develop strategies to overcome them. Patients will also gain an understanding of how our mind-set can impact our ability to achieve goals and the importance of cultivating a positive and growth-oriented mind-set. The purpose of this lesson is to provide patients with a deeper understanding of how to set and achieve personal goals, as well as the tools and strategies needed to overcome common obstacles which may arise along the way.  From Head to Heart: The  Power of a Healthy Outlook  Clinical staff led group instruction and group discussion with PowerPoint presentation and patient guidebook. To enhance the learning environment the use of posters, models and videos may be added. Patients will be able to recognize and describe the impact of emotions and mood on physical health. They will discover the importance of self-care and explore self-care practices which may work for them. Patients will also learn how to utilize the 4 C's to cultivate a healthier outlook and better manage stress and challenges. The purpose of this lesson is to demonstrate to patients how a healthy outlook is an essential part of maintaining good health, especially as they continue their cardiac rehab journey.  Healthy Sleep for a Healthy Heart Clinical staff led group instruction and group discussion with PowerPoint presentation and patient guidebook. To enhance the learning environment the use of posters, models and videos may be added. At the conclusion of this workshop, patients will be able to demonstrate knowledge of the importance of sleep to overall health, well-being, and quality of life. They will understand the symptoms of, and treatments for, common sleep disorders. Patients will also be able to identify daytime and nighttime behaviors which impact sleep, and they will be able to apply these tools to help manage sleep-related challenges. The purpose of this lesson is to provide patients with a general overview of sleep and outline the importance of quality sleep. Patients will learn about a few of the most common sleep disorders. Patients will also be introduced to the concept of "sleep hygiene," and discover ways to self-manage certain sleeping problems through simple daily behavior changes. Finally, the workshop will motivate patients by clarifying the links between quality sleep and their goals of heart-healthy living.   Recognizing and Reducing Stress Clinical staff led  group instruction and group discussion with PowerPoint presentation and patient guidebook. To enhance the learning environment the use of posters, models and videos may be added. At the conclusion of this workshop, patients will be able to understand the types of stress reactions, differentiate between acute and chronic stress,  and recognize the impact that chronic stress has on their health. They will also be able to apply different coping mechanisms, such as reframing negative self-talk. Patients will have the opportunity to practice a variety of stress management techniques, such as deep abdominal breathing, progressive muscle relaxation, and/or guided imagery.  The purpose of this lesson is to educate patients on the role of stress in their lives and to provide healthy techniques for coping with it.  Learning Barriers/Preferences:  Learning Barriers/Preferences - 05/13/23 1004       Learning Barriers/Preferences   Learning Barriers None    Learning Preferences Audio;Computer/Internet;Group Instruction;Individual Instruction;Pictoral;Skilled Demonstration;Video;Written Material;Verbal Instruction             Education Topics:  Knowledge Questionnaire Score:  Knowledge Questionnaire Score - 07/26/23 0922       Knowledge Questionnaire Score   Pre Score 19/24    Post Score 19/24             Core Components/Risk Factors/Patient Goals at Admission:  Personal Goals and Risk Factors at Admission - 05/13/23 0755       Core Components/Risk Factors/Patient Goals on Admission    Weight Management Yes    Intervention Weight Management: Develop a combined nutrition and exercise program designed to reach desired caloric intake, while maintaining appropriate intake of nutrient and fiber, sodium and fats, and appropriate energy expenditure required for the weight goal.;Weight Management: Provide education and appropriate resources to help participant work on and attain dietary goals.     Expected Outcomes Short Term: Continue to assess and modify interventions until short term weight is achieved;Long Term: Adherence to nutrition and physical activity/exercise program aimed toward attainment of established weight goal;Weight Loss: Understanding of general recommendations for a balanced deficit meal plan, which promotes 1-2 lb weight loss per week and includes a negative energy balance of [phone] kcal/d;Understanding recommendations for meals to include 15-35% energy as protein, 25-35% energy from fat, 35-60% energy from carbohydrates, less than 200mg  of dietary cholesterol, 20-35 gm of total fiber daily;Understanding of distribution of calorie intake throughout the day with the consumption of 4-5 meals/snacks    Diabetes Yes    Intervention Provide education about signs/symptoms and action to take for hypo/hyperglycemia.;Provide education about proper nutrition, including hydration, and aerobic/resistive exercise prescription along with prescribed medications to achieve blood glucose in normal ranges: Fasting glucose 65-99 mg/dL    Expected Outcomes Long Term: Attainment of HbA1C < 7%.;Short Term: Participant verbalizes understanding of the signs/symptoms and immediate care of hyper/hypoglycemia, proper foot care and importance of medication, aerobic/resistive exercise and nutrition plan for blood glucose control.    Hypertension Yes    Intervention Provide education on lifestyle modifcations including regular physical activity/exercise, weight management, moderate sodium restriction and increased consumption of fresh fruit, vegetables, and low fat dairy, alcohol moderation, and smoking cessation.;Monitor prescription use compliance.    Expected Outcomes Short Term: Continued assessment and intervention until BP is < 140/17mm HG in hypertensive participants. < 130/80mm HG in hypertensive participants with diabetes, heart failure or chronic kidney disease.;Long Term: Maintenance of blood  pressure at goal levels.    Lipids Yes    Intervention Provide education and support for participant on nutrition & aerobic/resistive exercise along with prescribed medications to achieve LDL 70mg , HDL >40mg .    Expected Outcomes Short Term: Participant states understanding of desired cholesterol values and is compliant with medications prescribed. Participant is following exercise prescription and nutrition guidelines.;Long Term: Cholesterol controlled with medications as prescribed, with individualized exercise RX  and with personalized nutrition plan. Value goals: LDL < 70mg , HDL > 40 mg.    Stress Yes    Intervention Offer individual and/or small group education and counseling on adjustment to heart disease, stress management and health-related lifestyle change. Teach and support self-help strategies.;Refer participants experiencing significant psychosocial distress to appropriate mental health specialists for further evaluation and treatment. When possible, include family members and significant others in education/counseling sessions.    Expected Outcomes Short Term: Participant demonstrates changes in health-related behavior, relaxation and other stress management skills, ability to obtain effective social support, and compliance with psychotropic medications if prescribed.;Long Term: Emotional wellbeing is indicated by absence of clinically significant psychosocial distress or social isolation.             Core Components/Risk Factors/Patient Goals Review:   Goals and Risk Factor Review     Row Name 06/01/23 1057 06/29/23 1750 07/28/23 1249         Core Components/Risk Factors/Patient Goals Review   Personal Goals Review Other;Weight Management/Obesity;Hypertension;Lipids;Diabetes Other;Weight Management/Obesity;Hypertension;Lipids;Diabetes Other;Weight Management/Obesity;Hypertension;Lipids;Diabetes     Review Pateitn stated she wants to work on her balance. Gave patient a pamphlet on  Balance & Vestibular Rehab. Amiera has been doing well with exercise at cardiac rehab. vital signs and CBG's have been stable. [place] has been doing well with exercise at cardiac rehab. vital signs and CBG's have been stable. [place] will complete cardiac rehab on 08/04/23. Oneyda has lost 3.1 kg since starting cardiac rehab.     Expected Outcomes Patient's balance will improve. Chazity will continue to participate in intensive cardiac rehab for exercise, nutrition and lifestyle modifications Threasa will continue to participate in intensive cardiac rehab for exercise, nutrition and lifestyle modifications              Core Components/Risk Factors/Patient Goals at Discharge (Final Review):   Goals and Risk Factor Review - 07/28/23 1249       Core Components/Risk Factors/Patient Goals Review   Personal Goals Review Other;Weight Management/Obesity;Hypertension;Lipids;Diabetes    Review Jorah has been doing well with exercise at cardiac rehab. vital signs and CBG's have been stable. Zyelle will complete cardiac rehab on 08/04/23. Mikela has lost 3.1 kg since starting cardiac rehab.    Expected Outcomes Anayancy will continue to participate in intensive cardiac rehab for exercise, nutrition and lifestyle modifications             ITP Comments:  ITP Comments     Row Name 05/13/23 0754 06/01/23 1050 06/29/23 1746 07/28/23 1245     ITP Comments Dr. Armanda Magic medical director. Introduction to pritikin education program/ intensive cardiac rehab. Initial orientation packet reviewed with patient. Patient will continue to participate in ICR for exercse, nutrition, and lifestyle modification. 30 Day ITP Review. Azlynn has good attendance and participation in intensive cardiac rehab. 30 Day ITP Review. Shalva has good attendance and participation in intensive cardiac rehab. Esbeidy will complete rehab on 08/04/23.             Comments: See ITP comments.Thayer Headings RN BSN

## 2023-07-30 ENCOUNTER — Encounter (HOSPITAL_COMMUNITY)
Admission: RE | Admit: 2023-07-30 | Discharge: 2023-07-30 | Disposition: A | Discharge status: Home / Self Care | Payer: Medicare Other | Source: Ambulatory Visit | Attending: Internal Medicine | Admitting: Internal Medicine

## 2023-07-30 DIAGNOSIS — Z48812 Encounter for surgical aftercare following surgery on the circulatory system: Secondary | ICD-10-CM | POA: Insufficient documentation

## 2023-07-30 DIAGNOSIS — Z955 Presence of coronary angioplasty implant and graft: Secondary | ICD-10-CM | POA: Insufficient documentation

## 2023-08-02 ENCOUNTER — Encounter (HOSPITAL_COMMUNITY)
Admission: RE | Admit: 2023-08-02 | Discharge: 2023-08-02 | Disposition: A | Discharge status: Home / Self Care | Payer: Medicare Other | Source: Ambulatory Visit | Attending: Internal Medicine | Admitting: Internal Medicine

## 2023-08-02 VITALS — Ht 65.0 in | Wt 184.3 lb

## 2023-08-02 DIAGNOSIS — Z955 Presence of coronary angioplasty implant and graft: Secondary | ICD-10-CM | POA: Diagnosis not present

## 2023-08-04 ENCOUNTER — Ambulatory Visit: Payer: Medicare Other | Admitting: Pulmonary Disease

## 2023-08-04 ENCOUNTER — Encounter (HOSPITAL_COMMUNITY)
Admission: RE | Admit: 2023-08-04 | Discharge: 2023-08-04 | Disposition: A | Discharge status: Home / Self Care | Payer: Medicare Other | Source: Ambulatory Visit | Attending: Internal Medicine | Admitting: Internal Medicine

## 2023-08-04 ENCOUNTER — Encounter: Payer: Self-pay | Admitting: Pulmonary Disease

## 2023-08-04 VITALS — BP 130/64 | HR 64 | Temp 97.4°F | Ht 65.0 in | Wt 186.0 lb

## 2023-08-04 DIAGNOSIS — R053 Chronic cough: Secondary | ICD-10-CM | POA: Diagnosis not present

## 2023-08-04 DIAGNOSIS — Z955 Presence of coronary angioplasty implant and graft: Secondary | ICD-10-CM | POA: Diagnosis not present

## 2023-08-04 MED ORDER — PANTOPRAZOLE SODIUM 40 MG PO TBEC: 40.0000 mg | DELAYED_RELEASE_TABLET | Freq: Every day | ORAL | 3 refills | Status: DC

## 2023-08-04 NOTE — Progress Notes (Signed)
@Patient  ID: Destiny Brown, female    DOB: May 29, 1946, 77 y.o.   MRN: [id number]  Chief Complaint  Patient presents with   Follow-up    Pt has no complaints today. Pt states her cough is better.    Referring provider: Adrian Prince, MD  HPI:   77 y.o. woman whom we are seeing in evaluation of chronic cough.  Multiple cardiology notes reviewed.    Doing well.  Cough is about the same.  Went for cardiac catheterization.  Still coughing.  Essentially immediately after cardiac catheterization and stent to circumflex April 2024, cough abated.  Stayed away.  Doing well.  Graduated cardiac rehab today.  Getting stronger.  HPI at initial visit: She contracted what sounds like viral illness 09/2022. Most symptoms improved but cough has persisted. Seems worse in evenings. Seen via PCP office numerous times per her report. Reports at least 1 CXR, told was clear, maybe bronchitis? Can not view report nor images. Treated with various things, but not much has helped. Denies any significant improvement with medrol dosepack. Albuterol helps but time it helps is fleeting or short.   On further questioning, she uses lozenges often. They help a bit. Menthol in them. Speaking seems to irritate cough. Like when she talks a lot in a day her cough seems worse. Cough seem spasmodic. Deep breaths can trigger coughing spell. Cough largely dry. She does endorse GERD symptoms.    Questionaires / Pulmonary Flowsheets:   ACT:      No data to display          MMRC:     No data to display          Epworth:      No data to display          Tests:   FENO:  No results found for: "NITRICOXIDE"  PFT:     No data to display          WALK:      No data to display          Imaging: Personally reviewed and as per EMR MM 3D SCREENING MAMMOGRAM BILATERAL BREAST  Result Date: 07/07/2023 CLINICAL DATA:  Screening. EXAM: DIGITAL SCREENING BILATERAL MAMMOGRAM WITH TOMOSYNTHESIS AND  CAD TECHNIQUE: Bilateral screening digital craniocaudal and mediolateral oblique mammograms were obtained. Bilateral screening digital breast tomosynthesis was performed. The images were evaluated with computer-aided detection. COMPARISON:  Previous exam(s). ACR Breast Density Category b: There are scattered areas of fibroglandular density. FINDINGS: There are no findings suspicious for malignancy. IMPRESSION: No mammographic evidence of malignancy. A result letter of this screening mammogram will be mailed directly to the patient. RECOMMENDATION: Screening mammogram in one year. (Code:SM-B-01Y) BI-RADS CATEGORY  1: Negative. Electronically Signed   By: Emmaline Kluver M.D.   On: 07/07/2023 07:40    Lab Results: Personally reviewed CBC    Component Value Date/Time   WBC 7.3 04/20/2023 1155   WBC 11.8 (H) 03/20/2017 0412   RBC 3.79 04/20/2023 1155   RBC 3.87 03/20/2017 0412   HGB 10.9 (L) 04/20/2023 1155   HCT 34.4 04/20/2023 1155   PLT 219 04/20/2023 1155   MCV 91 04/20/2023 1155   MCH 28.8 04/20/2023 1155   MCH 29.5 03/20/2017 0412   MCHC 31.7 04/20/2023 1155   MCHC 32.9 03/20/2017 0412   RDW 14.4 04/20/2023 1155   LYMPHSABS 1.1 03/18/2018 1003   MONOABS 0.3 10/11/2014 1220   EOSABS 0.1 03/18/2018 1003   BASOSABS 0.0 03/18/2018  1003    BMET    Component Value Date/Time   NA 135 04/20/2023 1155   K 4.3 04/20/2023 1155   CL 98 04/20/2023 1155   CO2 19 (L) 04/20/2023 1155   GLUCOSE 168 (H) 04/20/2023 1155   GLUCOSE 173 (H) 03/20/2017 0412   BUN 21 04/20/2023 1155   CREATININE 1.19 (H) 04/20/2023 1155   CALCIUM 9.8 04/20/2023 1155   GFRNONAA 51 (L) 06/17/2020 0956   GFRAA 59 (L) 06/17/2020 0956    BNP No results found for: "BNP"  ProBNP    Component Value Date/Time   PROBNP 365.5 (H) 10/11/2014 1237    Specialty Problems       Pulmonary Problems   OSA (obstructive sleep apnea)    Moderate OSA with AHI 16.7 events per hour with successful CPAP titration to 8cm  H2O      Chronic cough    Allergies  Allergen Reactions   Oxycodone Other (See Comments)    Severe headaches, hallucinations    Codeine Other (See Comments)    Severe Headaches with codeine; but not with other opioids   Percocet [Oxycodone-Acetaminophen] Other (See Comments)    hallucinations    Immunization History  Administered Date(s) Administered   Influenza, High Dose Seasonal PF 10/01/2017, 08/27/2018, 08/08/2019   Influenza-Unspecified 10/19/2022   Zoster Recombinant(Shingrix) 08/27/2018, 10/13/2018    Past Medical History:  Diagnosis Date   Arthritis    Atrial fibrillation with RVR (HCC) 09/21/2014   Bronchitis    h/o   Diabetes mellitus    NO MEDS,  DIET CONTROLLED   History of migraines    "when working under alot of stress"   Hypertension    Hypothyroid    LBBB (left bundle branch block) 09/22/2014   OSA (obstructive sleep apnea) 04/09/2015   Moderate OSA with AHI 16.7 events per hour with successful CPAP titration to 8cm H2O   PONV (postoperative nausea and vomiting)    PT DOESNOT WANT TO TAKE HER ATENOLOL     DOS.   " IT DROPS MY BP TO LOW"    Tobacco History: Social History   Tobacco Use  Smoking Status Never  Smokeless Tobacco Never   Counseling given: Not Answered   Continue to not smoke  Outpatient Encounter Medications as of 08/04/2023  Medication Sig   ACCU-CHEK FASTCLIX LANCETS MISC 1 each by Other route 2 (two) times daily. Use 1 lancet bid   ACCU-CHEK SMARTVIEW test strip 1 strip by Other route 2 (two) times daily. Use 1 strip to check glucose twice a day   albuterol (VENTOLIN HFA) 108 (90 Base) MCG/ACT inhaler Inhale 2 puffs into the lungs every 6 (six) hours as needed for wheezing or shortness of breath (Cough).   amLODipine (NORVASC) 5 MG tablet Take 5 mg by mouth in the morning.   apixaban (ELIQUIS) 5 MG TABS tablet Take 1 tablet (5 mg total) by mouth 2 (two) times daily.   Cholecalciferol (VITAMIN D3) 1000 UNITS CAPS Take 1,000  Units by mouth in the morning.   CLENPIQ 10-3.5-12 MG-GM -GM/175ML SOLN 175 mL orally twice for 2   clopidogrel (PLAVIX) 75 MG tablet Take 1 tablet (75 mg total) by mouth daily.   hydrochlorothiazide (HYDRODIURIL) 25 MG tablet Take 25 mg by mouth in the morning.   latanoprost (XALATAN) 0.005 % ophthalmic solution Place 1 drop into both eyes at bedtime.   levothyroxine (SYNTHROID, LEVOTHROID) 125 MCG tablet Take 125 mcg by mouth daily before breakfast.   losartan (COZAAR)  100 MG tablet Take 1 tablet (100 mg total) by mouth daily.   metFORMIN (GLUCOPHAGE) 1000 MG tablet Take 1,000 mg by mouth 2 (two) times daily.   rosuvastatin (CRESTOR) 5 MG tablet Take 5 mg by mouth 2 (two) times a week. Tuesdays & Thursdays.   [DISCONTINUED] pantoprazole (PROTONIX) 40 MG tablet TAKE 1 TABLET BY MOUTH EVERY DAY   pantoprazole (PROTONIX) 40 MG tablet Take 1 tablet (40 mg total) by mouth daily.   No facility-administered encounter medications on file as of 08/04/2023.     Review of Systems  Review of Systems  No chest pain with exertion, no orthopnea or pnd. Comprehensive ROS otherwise negative.  Physical Exam  BP 130/64 (BP Location: Left Arm, Cuff Size: Normal)   Pulse 64   Temp (!) 97.4 F (36.3 C)   Ht 5\' 5"  (1.651 m)   Wt 186 lb (84.4 kg)   SpO2 98%   BMI 30.95 kg/m   Wt Readings from Last 5 Encounters:  08/04/23 186 lb (84.4 kg)  08/02/23 184 lb 4.9 oz (83.6 kg)  05/13/23 191 lb 2.2 oz (86.7 kg)  05/03/23 189 lb (85.7 kg)  04/23/23 183 lb (83 kg)    BMI Readings from Last 5 Encounters:  08/04/23 30.95 kg/m  08/02/23 30.67 kg/m  05/13/23 31.81 kg/m  05/03/23 31.45 kg/m  04/23/23 30.45 kg/m     Physical Exam General: sitting in chair, in NAD Eyes: EOMI, no icterus Neck: supple, no JVP appreciated Pulmonary: clear, NWOB on RA CV: RRR, no murmur, no edema Abdomen: ND, BS present MSK: no synovitis, no joint effusion Neuro:norma; gait, no weakness Psych: normal mood, full  affect  Assessment & Plan:   Chronic cough. Present for months. Reports clear CXR at PCP. Started with what sounds like viral illness. Suspicion for asthma. Not improved with oral steroids.  No better with Breztri.  On PPI with minimal to no improvement.  No real improvement with postnasal drip treatment.  Resolved after cardiac catheterization.  No further workup or treatment at this time.  PPI refilled today, advised to discuss with PCP and gastroenterologist utility of continuing in the future.   Return if symptoms worsen or fail to improve.   Karren Burly, MD 08/04/2023

## 2023-08-04 NOTE — Patient Instructions (Signed)
Like to see you again  I refilled the acid medicine  Glad the cough is better  Return to clinic as needed

## 2023-08-04 NOTE — Addendum Note (Signed)
Addended byVilma Meckel on: 08/04/2023 03:21 PM   Modules accepted: Level of Service

## 2023-08-06 NOTE — Progress Notes (Signed)
Discharge Progress Report  Patient Details  Name: Destiny Brown MRN: [id number] Date of Birth: 1946-06-30 Referring Provider:   Flowsheet Row INTENSIVE CARDIAC REHAB ORIENT from 05/13/2023 in [facility]  Referring Provider Sherryl Manges, MD        Number of Visits: 30  Reason for Discharge:  Patient reached a stable level of exercise. Patient independent in their exercise. Patient has met program and personal goals.  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  ADL UCSD:   Initial Exercise Prescription:  Initial Exercise Prescription - 05/13/23 1000       Date of Initial Exercise RX and Referring Provider   Date 05/13/23    Referring Provider Sherryl Manges, MD    Expected Discharge Date 07/23/23      NuStep   Level 1    SPM 85    Minutes 15    METs 2      Prescription Details   Frequency (times per week) 3    Duration Progress to 30 minutes of continuous aerobic without signs/symptoms of physical distress      Intensity   THRR 40-80% of Max Heartrate 57-114    Ratings of Perceived Exertion 11-13    Perceived Dyspnea 0-4      Progression   Progression Continue progressive overload as per policy without signs/symptoms or physical distress.      Resistance Training   Training Prescription Yes    Weight 2    Reps 10-15             Discharge Exercise Prescription (Final Exercise Prescription Changes):  Exercise Prescription Changes - 08/04/23 1030       Response to Exercise   Blood Pressure (Admit) 132/62    Blood Pressure (Exercise) 154/70    Blood Pressure (Exit) 128/70    Heart Rate (Admit) 87 bpm    Heart Rate (Exercise) 116 bpm    Heart Rate (Exit) 84 bpm    Rating of Perceived Exertion (Exercise) 12    Symptoms None    Comments Pt graduated from the  CRP2 program today.    Duration Continue with 30 min of aerobic exercise without signs/symptoms of physical distress.    Intensity THRR unchanged      Progression   Progression Continue to progress workloads to maintain intensity without signs/symptoms of physical distress.    Average METs 3.1      Resistance Training   Training Prescription No    Weight No weights on Wednesdays      Interval Training   Interval Training No      NuStep   Level 5    SPM 132    Minutes 30    METs 3.1      Home Exercise Plan   Plans to continue exercise at [facility] (comment)    Frequency Add 2 additional days to program exercise sessions.    Initial Home Exercises Provided 06/18/23             Functional Capacity:  6 Minute Walk     Row Name 05/13/23 0800 07/26/23 0922       6 Minute Walk   Phase Initial Discharge    Distance 874 feet  Used Cane 1335 feet    Distance % Change -- 52.75 %  Distance Feet Change -- 461 ft    Walk Time 6 minutes 6 minutes    # of Rest Breaks 1  3:00-4:10, stopped due to HR 130. resumed once within target 1  10 seconds to get a drink    MPH 1.7 2.53    METS 2.1 2.9    RPE 13 12    Perceived Dyspnea  1.5 2    VO2 Peak 7.23 10.2    Symptoms Yes (comment) Yes (comment)    Comments SOB, RPD = 1.5. Resolved with rest. No pain SOB, RPD = 2. Resolved with rest. No pain    Resting HR 78 bpm 79 bpm    Resting BP 122/84 138/70    Resting Oxygen Saturation  98 % --    Exercise Oxygen Saturation  during 6 min walk 100 % --    Max Ex. HR 137 bpm 125 bpm    Max Ex. BP 156/76 168/80    2 Minute Post BP 128/74 --             Psychological, QOL, Others - Outcomes: PHQ 2/9:    08/04/2023   10:14 AM 05/13/2023   10:00 AM  Depression screen PHQ 2/9  Decreased Interest 0 0  Down, Depressed, Hopeless 0 0  PHQ - 2 Score 0 0  Altered sleeping 0 0  Tired, decreased energy 0 0  Change in appetite 0 0  Feeling bad or failure about yourself  0 0   Trouble concentrating 0 0  Moving slowly or fidgety/restless 0 0  Suicidal thoughts 0 0  PHQ-9 Score 0 0    Quality of Life:  Quality of Life - 07/26/23 0922       Quality of Life Scores   Health/Function Pre 22.7 %    Health/Function Post 23.5 %    Health/Function % Change 3.52 %    Socioeconomic Pre 25.07 %    Socioeconomic Post 27.43 %    Socioeconomic % Change  9.41 %    Psych/Spiritual Pre 24.86 %    Psych/Spiritual Post 25.71 %    Psych/Spiritual % Change 3.42 %    Family Pre 22.5 %    Family Post 25.2 %    Family % Change 12 %    GLOBAL Pre 23.6 %    GLOBAL Post 25.01 %    GLOBAL % Change 5.97 %             Personal Goals: Goals established at orientation with interventions provided to work toward goal.  Personal Goals and Risk Factors at Admission - 05/13/23 0755       Core Components/Risk Factors/Patient Goals on Admission    Weight Management Yes    Intervention Weight Management: Develop a combined nutrition and exercise program designed to reach desired caloric intake, while maintaining appropriate intake of nutrient and fiber, sodium and fats, and appropriate energy expenditure required for the weight goal.;Weight Management: Provide education and appropriate resources to help participant work on and attain dietary goals.    Expected Outcomes Short Term: Continue to assess and modify interventions until short term weight is achieved;Long Term: Adherence to nutrition and physical activity/exercise program aimed toward attainment of established weight goal;Weight Loss: Understanding of general recommendations for a balanced deficit meal plan, which promotes 1-2 lb weight loss per week and includes a negative energy balance of [phone] kcal/d;Understanding recommendations for meals to include 15-35% energy as protein, 25-35% energy from fat, 35-60% energy from carbohydrates, less than 200mg   of dietary cholesterol, 20-35 gm of total fiber daily;Understanding of  distribution of calorie intake throughout the day with the consumption of 4-5 meals/snacks    Diabetes Yes    Intervention Provide education about signs/symptoms and action to take for hypo/hyperglycemia.;Provide education about proper nutrition, including hydration, and aerobic/resistive exercise prescription along with prescribed medications to achieve blood glucose in normal ranges: Fasting glucose 65-99 mg/dL    Expected Outcomes Long Term: Attainment of HbA1C < 7%.;Short Term: Participant verbalizes understanding of the signs/symptoms and immediate care of hyper/hypoglycemia, proper foot care and importance of medication, aerobic/resistive exercise and nutrition plan for blood glucose control.    Hypertension Yes    Intervention Provide education on lifestyle modifcations including regular physical activity/exercise, weight management, moderate sodium restriction and increased consumption of fresh fruit, vegetables, and low fat dairy, alcohol moderation, and smoking cessation.;Monitor prescription use compliance.    Expected Outcomes Short Term: Continued assessment and intervention until BP is < 140/59mm HG in hypertensive participants. < 130/43mm HG in hypertensive participants with diabetes, heart failure or chronic kidney disease.;Long Term: Maintenance of blood pressure at goal levels.    Lipids Yes    Intervention Provide education and support for participant on nutrition & aerobic/resistive exercise along with prescribed medications to achieve LDL 70mg , HDL >40mg .    Expected Outcomes Short Term: Participant states understanding of desired cholesterol values and is compliant with medications prescribed. Participant is following exercise prescription and nutrition guidelines.;Long Term: Cholesterol controlled with medications as prescribed, with individualized exercise RX and with personalized nutrition plan. Value goals: LDL < 70mg , HDL > 40 mg.    Stress Yes    Intervention Offer individual  and/or small group education and counseling on adjustment to heart disease, stress management and health-related lifestyle change. Teach and support self-help strategies.;Refer participants experiencing significant psychosocial distress to appropriate mental health specialists for further evaluation and treatment. When possible, include family members and significant others in education/counseling sessions.    Expected Outcomes Short Term: Participant demonstrates changes in health-related behavior, relaxation and other stress management skills, ability to obtain effective social support, and compliance with psychotropic medications if prescribed.;Long Term: Emotional wellbeing is indicated by absence of clinically significant psychosocial distress or social isolation.              Personal Goals Discharge:  Goals and Risk Factor Review     Row Name 06/01/23 1057 06/29/23 1750 07/28/23 1249 08/04/23 1229       Core Components/Risk Factors/Patient Goals Review   Personal Goals Review Other;Weight Management/Obesity;Hypertension;Lipids;Diabetes Other;Weight Management/Obesity;Hypertension;Lipids;Diabetes Other;Weight Management/Obesity;Hypertension;Lipids;Diabetes Weight Management/Obesity;Hypertension;Lipids;Diabetes;Develop more efficient breathing techniques such as purse lipped breathing and diaphragmatic breathing and practicing self-pacing with activity.;Stress    Review Destiny Brown stated she wants to work on her balance. Gave patient a pamphlet on Balance & Vestibular Rehab. Destiny Brown has been doing well with exercise at cardiac rehab. vital signs and CBG's have been stable. Destiny Brown has been doing well with exercise at cardiac rehab. vital signs and CBG's have been stable. Destiny Brown will complete cardiac rehab on 08/04/23. Destiny Brown has lost 3.1 kg since starting cardiac rehab. Goal met for weight loss at the time of graduation. Destiny Brown is down ~7# since starting the program. She worked with our dietician  and states she will continue to make healthy choices and follow the Pritikin diet after graduation. Goal met for A1C below 7. Destiny Brown's blood sugars have been WNL during the program and her last A1C on 06/01/23 was 6.1. Goal met for hypertension control. Destiny Brown  is compliant with taking her BP meds and her BP have been WNL during the program. Goal met for lipid control. Last lipid panel on 06/01/23 was WNL. Goal progressing for decreasing stress. Destiny Brown still worries about her adult grandchildren. She states that exercising decreases her stress level and plans to continue exercising at the [facility], participating in [employer]. Destiny Brown also states that her dyspnea has improved since starting the program. Destiny Brown can properly demonstrate and use purse lipped breathing without prompting from staff.    Expected Outcomes Patient's balance will improve. Destiny Brown will continue to participate in intensive cardiac rehab for exercise, nutrition and lifestyle modifications Destiny Brown will continue to participate in intensive cardiac rehab for exercise, nutrition and lifestyle modifications Destiny Brown graduated from the program today, 08/04/23. For Destiny Brown to continue to exercise, practice Pritikin nutrition and lifestyle modifications.             Exercise Goals and Review:  Exercise Goals     Row Name 05/13/23 0754             Exercise Goals   Increase Physical Activity Yes       Intervention Provide advice, education, support and counseling about physical activity/exercise needs.;Develop an individualized exercise prescription for aerobic and resistive training based on initial evaluation findings, risk stratification, comorbidities and participant's personal goals.       Expected Outcomes Short Term: Attend rehab on a regular basis to increase amount of physical activity.;Long Term: Exercising regularly at least 3-5 days a week.;Long Term: Add in home exercise to make exercise part of routine and to increase amount of  physical activity.       Increase Strength and Stamina Yes       Intervention Provide advice, education, support and counseling about physical activity/exercise needs.;Develop an individualized exercise prescription for aerobic and resistive training based on initial evaluation findings, risk stratification, comorbidities and participant's personal goals.       Expected Outcomes Short Term: Increase workloads from initial exercise prescription for resistance, speed, and METs.;Short Term: Perform resistance training exercises routinely during rehab and add in resistance training at home;Long Term: Improve cardiorespiratory fitness, muscular endurance and strength as measured by increased METs and functional capacity ( )       Able to understand and use rate of perceived exertion (RPE) scale Yes       Intervention Provide education and explanation on how to use RPE scale       Expected Outcomes Short Term: Able to use RPE daily in rehab to express subjective intensity level;Long Term:  Able to use RPE to guide intensity level when exercising independently       Able to understand and use Dyspnea scale Yes       Intervention Provide education and explanation on how to use Dyspnea scale       Expected Outcomes Short Term: Able to use Dyspnea scale daily in rehab to express subjective sense of shortness of breath during exertion;Long Term: Able to use Dyspnea scale to guide intensity level when exercising independently       Knowledge and understanding of Target Heart Rate Range (THRR) Yes       Intervention Provide education and explanation of THRR including how the numbers were predicted and where they are located for reference       Expected Outcomes Short Term: Able to state/look up THRR;Short Term: Able to use daily as guideline for intensity in rehab;Long Term: Able to use THRR to govern intensity when exercising  independently       Understanding of Exercise Prescription Yes       Intervention  Provide education, explanation, and written materials on patient's individual exercise prescription       Expected Outcomes Short Term: Able to explain program exercise prescription;Long Term: Able to explain home exercise prescription to exercise independently                Exercise Goals Re-Evaluation:  Exercise Goals Re-Evaluation     Row Name 05/17/23 1621 06/18/23 1417 07/23/23 1202 08/04/23 1030       Exercise Goal Re-Evaluation   Exercise Goals Review Increase Physical Activity;Increase Strength and Stamina;Able to understand and use rate of perceived exertion (RPE) scale;Knowledge and understanding of Target Heart Rate Range (THRR);Understanding of Exercise Prescription Increase Physical Activity;Increase Strength and Stamina;Able to understand and use rate of perceived exertion (RPE) scale;Knowledge and understanding of Target Heart Rate Range (THRR);Understanding of Exercise Prescription Increase Physical Activity;Increase Strength and Stamina;Able to understand and use rate of perceived exertion (RPE) scale;Knowledge and understanding of Target Heart Rate Range (THRR);Understanding of Exercise Prescription Increase Physical Activity;Increase Strength and Stamina;Able to understand and use rate of perceived exertion (RPE) scale;Knowledge and understanding of Target Heart Rate Range (THRR);Understanding of Exercise Prescription    Comments Pt's first day in the cRP2 program. Pt understands the exercise Rx, RPE scale and THRR. Reviewed METs, goals and home exercise Rx. Pt is making good progress with peak METs of 2.5. In regards to pt goals: Pt vocies improvement in strength and stamina; is moving better but is still bothered by her knee; she voices less SOB with activity; voices thst she needs to use the chair less during warm-up/cool-down and weights; voices she is able to do more syuff around the house again. Reviewed METs and goals. Pt ia making good progress on her goals of increased  strength and stamina. She voices she is moving easier and has not ben using her cane. In regards to her wieight loss goal pt has lost 3.3 kg. Pt' SOB has imrpoved but still gets "winded" sometimes, but she voices its much improved. Pt graduated from the [employer] today. Pt had peak METs of 3.1. Pt has made good progress on her goals of improve strength and stamina, moving better and weight loss. Pt has joined the [employer] and plans to continue her exercise in the PREP program; referral was sent.    Expected Outcomes Will continue to monitor patient and progress exercise workloads as tolerated. Will continue to monitor patient and progress exercise workloads as tolerated. Will continue to monitor patient and progress exercise workloads as tolerated. Pt will continue to exercise at the [facility].             Nutrition & Weight - Outcomes:  Pre Biometrics - 05/13/23 0753       Pre Biometrics   Waist Circumference 46.25 inches    Hip Circumference 50.5 inches    Waist to Hip Ratio 0.92 %    Triceps Skinfold 35 mm    % Body Fat 47 %    Grip Strength 16 kg    Flexibility 10.5 in    Single Leg Stand 3.5 seconds             Post Biometrics - 08/02/23 0800        Post  Biometrics   Height 5\' 5"  (1.651 m)    Weight 83.6 kg    Waist Circumference 44.75 inches    Hip Circumference 49.5  inches    Waist to Hip Ratio 0.9 %    BMI (Calculated) 30.67    Triceps Skinfold 33 mm    % Body Fat 45.6 %    Grip Strength 23 kg    Flexibility 10.5 in    Single Leg Stand 4 seconds             Nutrition:  Nutrition Therapy & Goals - 07/14/23 0927       Nutrition Therapy   Diet Heart healthy diet    Drug/Food Interactions Statins/Certain Fruits      Personal Nutrition Goals   Nutrition Goal Patient to identify strategies for reducing cardiovascular risk by attending the [employer] education and nutrition series weekly.   goal in action.   Personal Goal #2 Patient to improve diet quality by  using the plate method as a guide for meal planning to include lean protein/plant protein, fruits, vegetables, whole grains, nonfat dairy as part of a well-balanced diet.   goal in action.   Comments Goals in action. Emelee continues to attend the [employer] and nutrition series regularly. She is down 6.8# since starting with our program. She continues to work on heart healthy lifestyle changes including reduced sodium intake and increased high fiber intake. Patient will benefit from participation in intensive cardiac rehab for nutrition, exercise, and lifestyle modification.      Intervention Plan   Intervention Prescribe, educate and counsel regarding individualized specific dietary modifications aiming towards targeted core components such as weight, hypertension, lipid management, diabetes, heart failure and other comorbidities.;Nutrition handout(s) given to patient.    Expected Outcomes Short Term Goal: Understand basic principles of dietary content, such as calories, fat, sodium, cholesterol and nutrients.;Long Term Goal: Adherence to prescribed nutrition plan.             Nutrition Discharge:  Nutrition Assessments - 05/17/23 1144       Rate Your Plate Scores   Pre Score 63             Education Questionnaire Score:  Knowledge Questionnaire Score - 07/26/23 0922       Knowledge Questionnaire Score   Pre Score 19/24    Post Score 19/24            Destiny Brown graduated today, 08/04/23 from the Intensive/Traditional cardiac rehab program, completing 30 exercise and education sessions. Goals reviewed with patient; copy given to patient. Pt maintained good attendance and progressed nicely during her participation in rehab as evidenced by increased METs and workload. Destiny Brown increased her distance on her post exercise walk test from 874 feet to 1835 feet, which is a 52.75% change. Medication list reconciled. Initial PHQ-2/PHQ-9 score 0/0, post scores 0/0. Pt has made  significant lifestyle changes and should be commended for her success. Destiny Brown met her goal for weight loss at the time of graduation. Destiny Brown is down ~7# since starting the program. She worked with our dietician and states she will continue to make healthy choices and follow the Pritikin diet after graduation. Goal met for A1C below 7. Arlethia's blood sugars have been WNL during the program and her last A1C on 06/01/23 was 6.1. Goal met for hypertension control. Lailonie is compliant with taking her BP meds and her BPs have been WNL during the program. Goal met for lipid control. Last lipid panel on 06/01/23 was WNL. Goal progressing for decreasing stress. Dijonae still worries about her adult grandchildren. She states that exercising decreases her stress level and plans to continue exercising at  the [facility], participating in [employer]. Jaleyza also states that her dyspnea has improved since starting the program. Saahithi can properly demonstrate and use purse lipped breathing without prompting from staff. [place] plans to continue exercising at the [facility] post CR graduation. Referral has been placed for the PREP program. We are proud of Katori's progress!

## 2023-08-09 ENCOUNTER — Telehealth: Payer: Self-pay | Admitting: *Deleted

## 2023-08-09 NOTE — Telephone Encounter (Signed)
   Pre-operative Risk Assessment    Patient Name: Destiny Brown  DOB: 15-Dec-1946 MRN: [id number]      Request for Surgical Clearance    Procedure:   EGD/Colonoscopy  Date of Surgery:  Clearance 08/27/23                                 Surgeon:  Dr. Jeani Hawking Surgeon's Group or Practice Name:  [facility] Phone number:  [phone] Fax number:  [phone]   Type of Clearance Requested:   - Medical  - Pharmacy:  Hold Clopidogrel (Plavix) and Apixaban (Eliquis) Not Indicated   Type of Anesthesia:   Propofol   Additional requests/questions:    Signed, Emmit Pomfret   08/09/2023, 11:05 AM

## 2023-08-09 NOTE — Telephone Encounter (Signed)
Patient with diagnosis of afib on Eliquis for anticoagulation.    Procedure: EGD/Colonoscopy Date of procedure: 08/27/23   CHA2DS2-VASc Score = 6   This indicates a 9.7% annual risk of stroke. The patient's score is based upon: CHF History: 0 HTN History: 1 Diabetes History: 1 Stroke History: 0 Vascular Disease History: 1 Age Score: 2 Gender Score: 1      CrCl 41.8 ml/min  Per office protocol, patient can hold Eliquis for 2 days prior to procedure.    **This guidance is not considered finalized until pre-operative APP has relayed final recommendations.**

## 2023-08-11 NOTE — Telephone Encounter (Signed)
   Name: Destiny Brown  DOB: 08/03/1946  MRN: [id number]  Primary Cardiologist: Sherryl Manges, MD  Chart reviewed as part of pre-operative protocol coverage. Because of Destiny Brown's past medical history and time since last visit, she will require a follow-up telephone visit in order to better assess preoperative cardiovascular risk.  Pre-op covering staff: - Please schedule appointment and call patient to inform them. If patient already had an upcoming appointment within acceptable timeframe, please add "pre-op clearance" to the appointment notes so provider is aware. - Please contact requesting surgeon's office via preferred method (i.e, phone, fax) to inform them of need for appointment prior to surgery.  Please see below recommendations from Dr. Excell Seltzer and PharmD.  Sharlene Dory, PA-C  08/11/2023, 4:14 PM

## 2023-08-11 NOTE — Telephone Encounter (Signed)
Yes. She can transition from plavix to ASA 81 mg daily and ideally would continue ASA without hold around colonoscopy so that she have some antiplatelet coverage. thx

## 2023-08-11 NOTE — Telephone Encounter (Signed)
Spoke with patient who is agreeable to do a tele visit on 8/26 at 2:40 pm. Med rec and consent done.

## 2023-08-18 ENCOUNTER — Encounter (HOSPITAL_COMMUNITY): Payer: Self-pay | Admitting: Gastroenterology

## 2023-08-18 NOTE — Progress Notes (Addendum)
Anesthesia Review:  PCP: Adrian Prince  Cardiologist : Berton Mount  To have telephone visit on 08/23/23  Chest x-ray : EKG : 05/03/23  Echo : 02/24/23  CT cors- 02/14/20  Stress test: 2015  Cardiac Cath :  04/23/23  Activity level: can do a flight of stairs without difficutly  Sleep Study/ CPAP : has sleep apnea  no cpap  Fasting Blood Sugar :      / Checks Blood Sugar -- times a day:   Blood Thinner/ Instructions /Last Dose: ASA / Instructions/ Last Dose :    DM- type 2- checks daily in am  Metofrmin none day of surgery  Stents- cardiac- recent , hx of afib 8 years ago    Eliquis-    PT to receive eliquis instructions on appt of 08/23/23.     PT has Spinal cord Stimulator - has not worked in 2 years per pt on phone call of 08/18/23.  .  Pt states she needs to have removed    PT states she just completed Cardiac REhab.

## 2023-08-23 ENCOUNTER — Ambulatory Visit: Payer: Medicare Other | Attending: Cardiology | Admitting: Student

## 2023-08-23 NOTE — Progress Notes (Signed)
Virtual Visit via Telephone Note   Because of Destiny Brown's co-morbid illnesses, she is at least at moderate risk for complications without adequate follow up.  This format is felt to be most appropriate for this patient at this time.  The patient did not have access to video technology/had technical difficulties with video requiring transitioning to audio format only (telephone).  All issues noted in this document were discussed and addressed.  No physical exam could be performed with this format.  Please refer to the patient's chart for her consent to telehealth for [facility].  Evaluation Performed:  Preoperative cardiovascular risk assessment _____________   Date:  08/23/2023   Patient ID:  Destiny Brown, DOB [birth year], MRN [id number] Patient Location:  Home Provider location:   Office  Primary Care Provider:  Adrian Prince, MD Primary Cardiologist:  Sherryl Manges, MD  Chief Complaint / Patient Profile   77 y.o. y/o female with a h/o CAD s/p PCI with DES to mid LCx April 2024, PAF, LBBB, hypertension, OSA, T2DM, hypothyroidism, RA, asthma who is pending EGD/Colonoscopy by Dr. Elnoria Howard on 08/27/2023 and presents today for telephonic preoperative cardiovascular risk assessment.  History of Present Illness    Destiny Brown is a 77 y.o. female who presents via audio/video conferencing for a telehealth visit today.  Pt was last seen in cardiology clinic on 05/03/2023 by Dr. Graciela Husbands.  At that time PATTSY SMEATON was stable from a cardiac standpoint.  The patient is now pending procedure as outlined above. Since her last visit, she is doing well. Patient reports improving shortness of breath and dyspnea. She will have mild dyspnea with heavy exertion but not routine activities. This is improving as well. No chest pain, pressure, or tightness. Denies lower extremity edema, orthopnea, or PND. No palpitations. She just finished cardiac rehab. She walks for exercise and is planning  on joining [employer].   Past Medical History    Past Medical History:  Diagnosis Date   Arthritis    Atrial fibrillation with RVR (HCC) 09/21/2014   Bronchitis    h/o   Diabetes mellitus    NO MEDS,  DIET CONTROLLED   History of migraines    "when working under alot of stress"   Hypertension    Hypothyroid    LBBB (left bundle branch block) 09/22/2014   OSA (obstructive sleep apnea) 04/09/2015   does not use cpap   PONV (postoperative nausea and vomiting)    PT DOESNOT WANT TO TAKE HER ATENOLOL     DOS.   " IT DROPS MY BP TO LOW"   Past Surgical History:  Procedure Laterality Date   ABDOMINAL HYSTERECTOMY  03/2001   APPENDECTOMY     BACK SURGERY     CATARACT EXTRACTION, BILATERAL     WITH IMPLANTS   CERVICAL FUSION  08/2003   CHOLECYSTECTOMY  03/2010   CORONARY STENT INTERVENTION N/A 04/23/2023   Procedure: CORONARY STENT INTERVENTION;  Surgeon: Tonny Bollman, MD;  Location: [facility] INVASIVE CV LAB;  Service: Cardiovascular;  Laterality: N/A;   EYE SURGERY     LEFT HEART CATH AND CORONARY ANGIOGRAPHY N/A 04/23/2023   Procedure: LEFT HEART CATH AND CORONARY ANGIOGRAPHY;  Surgeon: Tonny Bollman, MD;  Location: [facility] INVASIVE CV LAB;  Service: Cardiovascular;  Laterality: N/A;   LUMBAR DISC SURGERY     LUMBAR LAMINECTOMY/DECOMPRESSION MICRODISCECTOMY  10/2010   POSTERIOR FUSION LUMBAR SPINE  12/16/11; 12/2005; 07/2004   L2-3; L3-4; L4-5   REVERSE SHOULDER  ARTHROPLASTY Right 02/26/2022   Procedure: REVERSE SHOULDER ARTHROPLASTY;  Surgeon: Jones Broom, MD;  Location: [facility];  Service: Orthopedics;  Laterality: Right;   TOTAL KNEE ARTHROPLASTY Right 03/18/2017   Procedure: RIGHT TOTAL KNEE ARTHROPLASTY;  Surgeon: Eugenia Mcalpine, MD;  Location: [facility];  Service: Orthopedics;  Laterality: Right;    Allergies  Allergies  Allergen Reactions   Oxycodone Other (See Comments)    Severe headaches, hallucinations    Codeine Other (See Comments)    Severe Headaches with codeine; but not with  other opioids   Percocet [Oxycodone-Acetaminophen] Other (See Comments)    hallucinations    Home Medications    Prior to Admission medications   Medication Sig Start Date End Date Taking? Authorizing Provider  ACCU-CHEK FASTCLIX LANCETS MISC 1 each by Other route 2 (two) times daily. Use 1 lancet bid 10/21/15   [provider]  ACCU-CHEK SMARTVIEW test strip 1 strip by Other route 2 (two) times daily. Use 1 strip to check glucose twice a day 10/21/15   [provider]  albuterol (VENTOLIN HFA) 108 (90 Base) MCG/ACT inhaler Inhale 2 puffs into the lungs every 6 (six) hours as needed for wheezing or shortness of breath (Cough). 11/21/22   Theadora Rama Scales, PA-C  amLODipine (NORVASC) 5 MG tablet Take 5 mg by mouth in the morning. 12/14/16   [provider]  apixaban (ELIQUIS) 5 MG TABS tablet Take 1 tablet (5 mg total) by mouth 2 (two) times daily. 05/03/23   Duke Salvia, MD  Cholecalciferol (VITAMIN D3) 1000 UNITS CAPS Take 1,000 Units by mouth in the morning.    [provider]  CLENPIQ 10-3.5-12 MG-GM -GM/175ML SOLN 175 mL orally twice for 2 04/12/23   [provider]  clopidogrel (PLAVIX) 75 MG tablet Take 1 tablet (75 mg total) by mouth daily. 04/23/23   Tonny Bollman, MD  hydrochlorothiazide (HYDRODIURIL) 25 MG tablet Take 25 mg by mouth in the morning.    [provider]  latanoprost (XALATAN) 0.005 % ophthalmic solution Place 1 drop into both eyes at bedtime. 04/11/23   [provider]  levothyroxine (SYNTHROID, LEVOTHROID) 125 MCG tablet Take 125 mcg by mouth daily before breakfast.    [provider]  losartan (COZAAR) 100 MG tablet Take 1 tablet (100 mg total) by mouth daily. 05/03/23   Duke Salvia, MD  metFORMIN (GLUCOPHAGE) 1000 MG tablet Take 1,000 mg by mouth 2 (two) times daily.    [provider]  pantoprazole (PROTONIX) 40 MG tablet Take 1 tablet (40 mg total) by mouth daily. 08/04/23    Hunsucker, Lesia Sago, MD  rosuvastatin (CRESTOR) 5 MG tablet Take 5 mg by mouth 2 (two) times a week. Tuesdays & Thursdays.    [provider]    Physical Exam    Vital Signs:  ADDELYNNE MENG does not have vital signs available for review today.  Given telephonic nature of communication, physical exam is limited. AAOx3. NAD. Normal affect.  Speech and respirations are unlabored.  Accessory Clinical Findings    None  Assessment & Plan    Primary Cardiologist: Sherryl Manges, MD  Preoperative cardiovascular risk assessment. EGD/Colonoscopy by Dr. Elnoria Howard on 08/27/2023  Chart reviewed as part of pre-operative protocol coverage. According to the RCRI, patient has a 0.9% risk of MACE. Patient reports activity equivalent to >4.0 METS (just finished cardiac rehab, walking, will be joining the [employer]).   Given past medical history and time since last visit, based on ACC/AHA  guidelines, QUIDA LESH would be at acceptable risk for the planned procedure without further cardiovascular testing.   Patient was advised that if she develops new symptoms prior to surgery to contact our office to arrange a follow-up appointment.  she verbalized understanding.  Per pharm D, patient may hold Eliquis for 2 days prior to procedure.  Per Dr. Excell Seltzer: "She can transition from plavix to ASA 81 mg daily and ideally would continue ASA without hold around colonoscopy so that she have some antiplatelet coverage." Patient has already transitioned to Asprin and understands to take it throughout perioperative period.   I will route this recommendation to the requesting party via Epic fax function.  Please call with questions.  Time:   Today, I have spent 7 minutes with the patient with telehealth technology discussing medical history, symptoms, and management plan.     Carlos Levering, NP  08/23/2023, 7:36 AM

## 2023-08-26 NOTE — Anesthesia Preprocedure Evaluation (Addendum)
Anesthesia Evaluation  Patient identified by MRN, date of birth, ID band Patient awake    Reviewed: Allergy & Precautions, NPO status , Patient's Chart, lab work & pertinent test results  History of Anesthesia Complications (+) PONV and history of anesthetic complications  Airway Mallampati: II  TM Distance: >3 FB Neck ROM: Full    Dental no notable dental hx.    Pulmonary sleep apnea    Pulmonary exam normal        Cardiovascular hypertension, Pt. on medications + CAD  Normal cardiovascular exam+ dysrhythmias Atrial Fibrillation      Neuro/Psych  Headaches S/p cervical fusion    GI/Hepatic Neg liver ROS,GERD  Medicated,,  Endo/Other  diabetes, Type 2, Oral Hypoglycemic AgentsHypothyroidism    Renal/GU negative Renal ROS     Musculoskeletal  (+) Arthritis ,    Abdominal   Peds  Hematology  (+) Blood dyscrasia, anemia   Anesthesia Other Findings Day of surgery medications reviewed with patient.  Reproductive/Obstetrics                              Anesthesia Physical Anesthesia Plan  ASA: 2  Anesthesia Plan: MAC   Post-op Pain Management: Minimal or no pain anticipated   Induction:   PONV Risk Score and Plan: Treatment may vary due to age or medical condition and Propofol infusion  Airway Management Planned: Natural Airway and Nasal Cannula  Additional Equipment: None  Intra-op Plan:   Post-operative Plan:   Informed Consent: I have reviewed the patients History and Physical, chart, labs and discussed the procedure including the risks, benefits and alternatives for the proposed anesthesia with the patient or authorized representative who has indicated his/her understanding and acceptance.       Plan Discussed with: CRNA  Anesthesia Plan Comments:         Anesthesia Quick Evaluation

## 2023-08-27 ENCOUNTER — Encounter (HOSPITAL_COMMUNITY)
Admission: RE | Disposition: A | Discharge status: Home / Self Care | Payer: Self-pay | Source: Home / Self Care | Attending: Gastroenterology

## 2023-08-27 ENCOUNTER — Encounter (HOSPITAL_COMMUNITY): Payer: Self-pay | Admitting: Gastroenterology

## 2023-08-27 ENCOUNTER — Ambulatory Visit (HOSPITAL_COMMUNITY): Payer: Medicare Other | Admitting: Anesthesiology

## 2023-08-27 ENCOUNTER — Ambulatory Visit (HOSPITAL_COMMUNITY)
Admission: RE | Admit: 2023-08-27 | Discharge: 2023-08-27 | Disposition: A | Discharge status: Home / Self Care | Payer: Medicare Other | Attending: Gastroenterology | Admitting: Gastroenterology

## 2023-08-27 ENCOUNTER — Ambulatory Visit (HOSPITAL_BASED_OUTPATIENT_CLINIC_OR_DEPARTMENT_OTHER): Payer: Medicare Other | Admitting: Anesthesiology

## 2023-08-27 ENCOUNTER — Other Ambulatory Visit: Payer: Self-pay

## 2023-08-27 DIAGNOSIS — K5730 Diverticulosis of large intestine without perforation or abscess without bleeding: Secondary | ICD-10-CM | POA: Insufficient documentation

## 2023-08-27 DIAGNOSIS — D509 Iron deficiency anemia, unspecified: Secondary | ICD-10-CM | POA: Insufficient documentation

## 2023-08-27 HISTORY — PX: COLONOSCOPY WITH PROPOFOL: SHX5780

## 2023-08-27 HISTORY — PX: ESOPHAGOGASTRODUODENOSCOPY: SHX5428

## 2023-08-27 LAB — GLUCOSE, CAPILLARY: Glucose-Capillary: 130 mg/dL — ABNORMAL HIGH (ref 70–99)

## 2023-08-27 SURGERY — COLONOSCOPY WITH PROPOFOL
Anesthesia: Monitor Anesthesia Care

## 2023-08-27 MED ORDER — LIDOCAINE HCL (CARDIAC) PF 100 MG/5ML IV SOSY
PREFILLED_SYRINGE | INTRAVENOUS | Status: DC | PRN
  Administered 2023-08-27: 50 mg via INTRATRACHEAL

## 2023-08-27 MED ORDER — PROPOFOL 500 MG/50ML IV EMUL
INTRAVENOUS | Status: AC
  Filled 2023-08-27: qty 100

## 2023-08-27 MED ORDER — PROPOFOL 500 MG/50ML IV EMUL
INTRAVENOUS | Status: AC
  Filled 2023-08-27: qty 50

## 2023-08-27 MED ORDER — SODIUM CHLORIDE 0.9 % IV SOLN: INTRAVENOUS | Status: DC

## 2023-08-27 MED ORDER — PROPOFOL 500 MG/50ML IV EMUL
INTRAVENOUS | Status: DC | PRN
  Administered 2023-08-27: 40 mg via INTRAVENOUS
  Administered 2023-08-27: 75 ug/kg/min via INTRAVENOUS
  Administered 2023-08-27 (×2): 40 mg via INTRAVENOUS

## 2023-08-27 MED ORDER — LACTATED RINGERS IV SOLN: INTRAVENOUS | Status: DC

## 2023-08-27 MED ORDER — LACTATED RINGERS IV SOLN: INTRAVENOUS | Status: DC | PRN

## 2023-08-27 SURGICAL SUPPLY — 22 items
ELECT REM PT RETURN 9FT ADLT (ELECTROSURGICAL)
ELECTRODE REM PT RTRN 9FT ADLT (ELECTROSURGICAL) IMPLANT
FCP BXJMBJMB 240X2.8X (CUTTING FORCEPS)
FLOOR PAD 36X40 (MISCELLANEOUS) ×1
FORCEPS BIOP RAD 4 LRG CAP 4 (CUTTING FORCEPS) IMPLANT
FORCEPS BIOP RJ4 240 W/NDL (CUTTING FORCEPS)
FORCEPS BXJMBJMB 240X2.8X (CUTTING FORCEPS) IMPLANT
INJECTOR/SNARE I SNARE (MISCELLANEOUS) IMPLANT
LUBRICANT JELLY 4.5OZ STERILE (MISCELLANEOUS) IMPLANT
MANIFOLD NEPTUNE II (INSTRUMENTS) IMPLANT
NDL SCLEROTHERAPY 25GX240 (NEEDLE) IMPLANT
NEEDLE SCLEROTHERAPY 25GX240 (NEEDLE)
PAD FLOOR 36X40 (MISCELLANEOUS) ×2 IMPLANT
PROBE APC STR FIRE (PROBE) IMPLANT
PROBE INJECTION GOLD (MISCELLANEOUS)
PROBE INJECTION GOLD 7FR (MISCELLANEOUS) IMPLANT
SNARE ROTATE MED OVAL 20MM (MISCELLANEOUS) IMPLANT
SYR 50ML LL SCALE MARK (SYRINGE) IMPLANT
TRAP SPECIMEN MUCOUS 40CC (MISCELLANEOUS) IMPLANT
TUBING ENDO SMARTCAP PENTAX (MISCELLANEOUS) IMPLANT
TUBING IRRIGATION ENDOGATOR (MISCELLANEOUS) ×2 IMPLANT
WATER STERILE IRR 1000ML POUR (IV SOLUTION) IMPLANT

## 2023-08-27 NOTE — Anesthesia Postprocedure Evaluation (Signed)
Anesthesia Post Note  Patient: Destiny Brown  Procedure(s) Performed: COLONOSCOPY WITH PROPOFOL ESOPHAGOGASTRODUODENOSCOPY (EGD)     Patient location during evaluation: PACU Anesthesia Type: MAC Level of consciousness: awake and alert Pain management: pain level controlled Vital Signs Assessment: post-procedure vital signs reviewed and stable Respiratory status: spontaneous breathing, nonlabored ventilation and respiratory function stable Cardiovascular status: blood pressure returned to baseline Postop Assessment: no apparent nausea or vomiting Anesthetic complications: no   No notable events documented.  Last Vitals:  Vitals:   08/27/23 0830 08/27/23 0840  BP: (!) 158/47 (!) 170/59  Pulse: (!) 56 (!) 51  Resp: 16 13  Temp:    SpO2: 100% 98%    Last Pain:  Vitals:   08/27/23 0840  TempSrc:   PainSc: 0-No pain                 Shanda Howells

## 2023-08-27 NOTE — H&P (Signed)
Destiny Brown HPI: She felt weak and noticed a drop in her stamina starting late last year. Dr. Clide Cliff pursued a cardic work up and her coronary artery calcium score suggested that she needed further evaluation with a cardiac catherization. Routine blood work was performed preprocedure and she was noted to be anemic. Because of this finding her catherization was cancelled and she was recommended a GI work up. Her last routine colonoscopy was with Dr. Levora Angel on 06/2016. Some ileocecal erosions were identified, but she was not anemic at that time. Her blood work from the recent years was as follows: 13.1 g/dL 01/02/1095, 04.5 g/dL 4/0/9811, 91.4 g/dL 7/82/9562, and 9.5 g/dL 01/27/8656. Her hemoccult in January was normal. The iron saturation on 03/12/2023 was at 30%. Throughout these years her MCV was in the 80-90's range. Just last week, on 04/01/2023, her HGB was 10.6 g/dL with an MCV of 84.6 without any supplementation. She does not report any issues with hematochezia, melena, hematemesis, or abdominal pain  Past Medical History:  Diagnosis Date   Arthritis    Atrial fibrillation with RVR (HCC) 09/21/2014   Bronchitis    h/o   Diabetes mellitus    NO MEDS,  DIET CONTROLLED   History of migraines    "when working under alot of stress"   Hypertension    Hypothyroid    LBBB (left bundle branch block) 09/22/2014   OSA (obstructive sleep apnea) 04/09/2015   does not use cpap   PONV (postoperative nausea and vomiting)    PT DOESNOT WANT TO TAKE HER ATENOLOL     DOS.   " IT DROPS MY BP TO LOW"    Past Surgical History:  Procedure Laterality Date   ABDOMINAL HYSTERECTOMY  03/2001   APPENDECTOMY     BACK SURGERY     CATARACT EXTRACTION, BILATERAL     WITH IMPLANTS   CERVICAL FUSION  08/2003   CHOLECYSTECTOMY  03/2010   CORONARY STENT INTERVENTION N/A 04/23/2023   Procedure: CORONARY STENT INTERVENTION;  Surgeon: Tonny Bollman, MD;  Location: [facility] INVASIVE CV LAB;  Service: Cardiovascular;   Laterality: N/A;   EYE SURGERY     LEFT HEART CATH AND CORONARY ANGIOGRAPHY N/A 04/23/2023   Procedure: LEFT HEART CATH AND CORONARY ANGIOGRAPHY;  Surgeon: Tonny Bollman, MD;  Location: [facility] INVASIVE CV LAB;  Service: Cardiovascular;  Laterality: N/A;   LUMBAR DISC SURGERY     LUMBAR LAMINECTOMY/DECOMPRESSION MICRODISCECTOMY  10/2010   POSTERIOR FUSION LUMBAR SPINE  12/16/11; 12/2005; 07/2004   L2-3; L3-4; L4-5   REVERSE SHOULDER ARTHROPLASTY Right 02/26/2022   Procedure: REVERSE SHOULDER ARTHROPLASTY;  Surgeon: Jones Broom, MD;  Location: [facility];  Service: Orthopedics;  Laterality: Right;   TOTAL KNEE ARTHROPLASTY Right 03/18/2017   Procedure: RIGHT TOTAL KNEE ARTHROPLASTY;  Surgeon: Eugenia Mcalpine, MD;  Location: [facility];  Service: Orthopedics;  Laterality: Right;    Family History  Adopted: Yes  Family history unknown: Yes    Social History:  reports that she has never smoked. She has never used smokeless tobacco. She reports that she does not drink alcohol and does not use drugs.  Allergies:  Allergies  Allergen Reactions   Oxycodone Other (See Comments)    Severe headaches, hallucinations    Codeine Other (See Comments)    Severe Headaches with codeine; but not with other opioids   Percocet [Oxycodone-Acetaminophen] Other (See Comments)    hallucinations    Medications: Scheduled: Continuous:  sodium chloride     lactated ringers 10  mL/hr at 08/27/23 7829    Results for orders placed or performed during the hospital encounter of 08/27/23 (from the past 24 hour(s))  Glucose, capillary     Status: Abnormal   Collection Time: 08/27/23  7:03 AM  Result Value Ref Range   Glucose-Capillary 130 (H) 70 - 99 mg/dL     No results found.  ROS:  As stated above in the HPI otherwise negative.  Blood pressure (!) 189/68, pulse 65, temperature (!) 96.7 F (35.9 C), temperature source Temporal, resp. rate 13, height 5\' 5"  (1.651 m), weight 81.6 kg, SpO2 100%.    PE: Gen: NAD,  Alert and Oriented HEENT:  [place]/AT, EOMI Neck: Supple, no LAD Lungs: CTA Bilaterally CV: RRR without M/G/R ABD: Soft, NTND, +BS Ext: No C/C/E  Assessment/Plan: 1) IDA - EGD/colonoscopy.  Destiny Brown D 08/27/2023, 7:21 AM

## 2023-08-27 NOTE — Transfer of Care (Signed)
Immediate Anesthesia Transfer of Care Note  Patient: Destiny Brown  Procedure(s) Performed: COLONOSCOPY WITH PROPOFOL ESOPHAGOGASTRODUODENOSCOPY (EGD)  Patient Location: PACU  Anesthesia Type:MAC  Level of Consciousness: awake  Airway & Oxygen Therapy: Patient Spontanous Breathing and Patient connected to face mask oxygen  Post-op Assessment: Report given to RN and Post -op Vital signs reviewed and stable  Post vital signs: Reviewed and stable  Last Vitals:  Vitals Value Taken Time  BP    Temp    Pulse    Resp    SpO2      Last Pain:  Vitals:   08/27/23 0655  TempSrc: Temporal  PainSc: 0-No pain         Complications: No notable events documented.

## 2023-08-27 NOTE — Addendum Note (Signed)
Addendum  created 08/27/23 0931 by Deri Fuelling, CRNA   Flowsheet accepted

## 2023-08-27 NOTE — Discharge Instructions (Signed)

## 2023-08-27 NOTE — Op Note (Signed)
[facility] Patient Name: Destiny Brown Procedure Date: 08/27/2023 MRN: [id number] Attending MD: Jeani Hawking , MD, [id number] Date of Birth: 1946/12/27 CSN: [id number] Age: 77 Admit Type: Outpatient Procedure:                Upper GI endoscopy Indications:              Iron deficiency anemia Providers:                Jeani Hawking, MD, Jacquelyn "Jaci" Clelia Croft, RN, Marge Duncans, RN, Harrington Challenger, Technician Referring MD:              Medicines:                Propofol per Anesthesia Complications:            No immediate complications. Estimated Blood Loss:     Estimated blood loss: none. Procedure:                Pre-Anesthesia Assessment:                           - Prior to the procedure, a History and Physical                            was performed, and patient medications and                            allergies were reviewed. The patient's tolerance of                            previous anesthesia was also reviewed. The risks                            and benefits of the procedure and the sedation                            options and risks were discussed with the patient.                            All questions were answered, and informed consent                            was obtained. Prior Anticoagulants: The patient has                            taken no anticoagulant or antiplatelet agents. ASA                            Grade Assessment: III - A patient with severe                            systemic disease. After reviewing the risks and  benefits, the patient was deemed in satisfactory                            condition to undergo the procedure.                           - Sedation was administered by an anesthesia                            professional. Deep sedation was attained.                           After obtaining informed consent, the endoscope was                            passed  under direct vision. Throughout the                            procedure, the patient's blood pressure, pulse, and                            oxygen saturations were monitored continuously. The                            [id number]-HQ190L ([id number]) Olympus colonoscope was                            introduced through the mouth, and advanced to the                            second part of duodenum. The upper GI endoscopy was                            accomplished without difficulty. The patient                            tolerated the procedure well. Scope In: Scope Out: Findings:      The esophagus was normal.      Mild portal hypertensive gastropathy was found in the gastric fundus and       in the gastric body.      The examined duodenum was normal. Impression:               - Normal esophagus.                           - Portal hypertensive gastropathy.                           - Normal examined duodenum.                           - No specimens collected. Moderate Sedation:      Not Applicable - Patient had care per Anesthesia. Recommendation:           - Patient has a contact number available for  emergencies. The signs and symptoms of potential                            delayed complications were discussed with the                            patient. Return to normal activities tomorrow.                            Written discharge instructions were provided to the                            patient.                           - Resume previous diet.                           - Continue present medications.                           - RUQ U/S to evaluate for cirrhosis.                           - Schedule for VCE. Procedure Code(s):        --- Professional ---                           [id number], Esophagogastroduodenoscopy, flexible,                            transoral; diagnostic, including collection of                            specimen(s) by brushing or  washing, when performed                            (separate procedure) Diagnosis Code(s):        --- Professional ---                           K76.6, Portal hypertension                           K31.89, Other diseases of stomach and duodenum                           D50.9, Iron deficiency anemia, unspecified CPT copyright 2022 [employer]. All rights reserved. The codes documented in this report are preliminary and upon coder review may  be revised to meet current compliance requirements. Jeani Hawking, MD Jeani Hawking, MD 08/27/2023 8:18:54 AM This report has been signed electronically. Number of Addenda: 0

## 2023-08-27 NOTE — Op Note (Signed)
[facility] Patient Name: Destiny Brown Procedure Date: 08/27/2023 MRN: [id number] Attending MD: Jeani Hawking , MD, [id number] Date of Birth: [birth year] CSN: [id number] Age: 77 Admit Type: Outpatient Procedure:                Colonoscopy Indications:              Iron deficiency anemia Providers:                Jeani Hawking, MD, Jacquelyn "Jaci" Clelia Croft, RN, Marge Duncans, RN Referring MD:              Medicines:                Propofol per Anesthesia Complications:            No immediate complications. Estimated Blood Loss:     Estimated blood loss: none. Procedure:                Pre-Anesthesia Assessment:                           - Prior to the procedure, a History and Physical                            was performed, and patient medications and                            allergies were reviewed. The patient's tolerance of                            previous anesthesia was also reviewed. The risks                            and benefits of the procedure and the sedation                            options and risks were discussed with the patient.                            All questions were answered, and informed consent                            was obtained. Prior Anticoagulants: The patient has                            taken no anticoagulant or antiplatelet agents. ASA                            Grade Assessment: III - A patient with severe                            systemic disease. After reviewing the risks and  benefits, the patient was deemed in satisfactory                            condition to undergo the procedure.                           - Sedation was administered by an anesthesia                            professional. Deep sedation was attained.                           After obtaining informed consent, the colonoscope                            was passed under direct vision.  Throughout the                            procedure, the patient's blood pressure, pulse, and                            oxygen saturations were monitored continuously. The                            PCF-HQ190L ([id number] Olympus colonoscope was                            introduced through the anus and advanced to the the                            cecum, identified by appendiceal orifice and                            ileocecal valve. The colonoscopy was performed                            without difficulty. The patient tolerated the                            procedure well. The quality of the bowel                            preparation was evaluated using the BBPS [facility]                            Bowel Preparation Scale) with scores of: Right                            Colon = 3 (entire mucosa seen well with no residual                            staining, small fragments of stool or opaque  liquid), Transverse Colon = 2 (minor amount of                            residual staining, small fragments of stool and/or                            opaque liquid, but mucosa seen well) and Left Colon                            = 2 (minor amount of residual staining, small                            fragments of stool and/or opaque liquid, but mucosa                            seen well). The total BBPS score equals 7. The                            quality of the bowel preparation was good. The                            ileocecal valve, appendiceal orifice, and rectum                            were photographed. Scope In: 7:49:07 AM Scope Out: 8:07:31 AM Scope Withdrawal Time: 0 hours 13 minutes 44 seconds  Total Procedure Duration: 0 hours 18 minutes 24 seconds  Findings:      A single large-mouthed diverticulum was found in the ascending colon. Impression:               - Diverticulosis in the ascending colon.                           - No specimens  collected. Moderate Sedation:      Not Applicable - Patient had care per Anesthesia. Recommendation:           - Patient has a contact number available for                            emergencies. The signs and symptoms of potential                            delayed complications were discussed with the                            patient. Return to normal activities tomorrow.                            Written discharge instructions were provided to the                            patient.                           -  Resume previous diet.                           - Continue present medications.                           - Repeat colonoscopy is not recommended for                            surveillance. Procedure Code(s):        --- Professional ---                           [id number], Colonoscopy, flexible; diagnostic, including                            collection of specimen(s) by brushing or washing,                            when performed (separate procedure) Diagnosis Code(s):        --- Professional ---                           D50.9, Iron deficiency anemia, unspecified                           K57.30, Diverticulosis of large intestine without                            perforation or abscess without bleeding CPT copyright 2022 [employer]. All rights reserved. The codes documented in this report are preliminary and upon coder review may  be revised to meet current compliance requirements. Jeani Hawking, MD Jeani Hawking, MD 08/27/2023 8:14:56 AM This report has been signed electronically. Number of Addenda: 0

## 2023-08-30 ENCOUNTER — Encounter (HOSPITAL_COMMUNITY): Payer: Self-pay | Admitting: Gastroenterology

## 2023-09-01 ENCOUNTER — Other Ambulatory Visit: Payer: Self-pay | Admitting: Gastroenterology

## 2023-09-01 DIAGNOSIS — R198 Other specified symptoms and signs involving the digestive system and abdomen: Secondary | ICD-10-CM

## 2023-09-01 DIAGNOSIS — K3189 Other diseases of stomach and duodenum: Secondary | ICD-10-CM

## 2023-09-07 ENCOUNTER — Ambulatory Visit
Admission: RE | Admit: 2023-09-07 | Discharge: 2023-09-07 | Disposition: A | Discharge status: Home / Self Care | Payer: Medicare Other | Source: Ambulatory Visit | Attending: Gastroenterology | Admitting: Gastroenterology

## 2023-09-07 DIAGNOSIS — K3189 Other diseases of stomach and duodenum: Secondary | ICD-10-CM

## 2023-09-07 DIAGNOSIS — R198 Other specified symptoms and signs involving the digestive system and abdomen: Secondary | ICD-10-CM

## 2023-10-29 NOTE — Progress Notes (Unsigned)
  Electrophysiology Office Note:   Date:  11/01/2023  ID:  Destiny Brown, DOB 07-04-1946, MRN [id number]  Primary Cardiologist: Sherryl Manges, MD Electrophysiologist: Sherryl Manges, MD      History of Present Illness:   Destiny Brown is a 77 y.o. female with h/o PAF, HTN, LBBB, Palpitations, and CAD s/p PCI 03/2023 seen today for routine electrophysiology followup.   Since last being seen in our clinic the patient reports doing very well. Has been wearing for election/early voting and has had a very busy two weeks, that she has tolerated well.  she denies chest pain, palpitations, dyspnea, PND, orthopnea, nausea, vomiting, dizziness, syncope, weight gain, or early satiety. Mild peripheral edema at the end of the day.   Review of systems complete and found to be negative unless listed in HPI.   EP Information / Studies Reviewed:    EKG is ordered today. Personal review as below.  EKG Interpretation Date/Time:  Monday November 01 2023 09:04:33 EST Ventricular Rate:  61 PR Interval:  118 QRS Duration:  142 QT Interval:  446 QTC Calculation: 448 R Axis:   -42  Text Interpretation: Normal sinus rhythm Left axis deviation Left bundle branch block Confirmed by Maxine Glenn [phone]) on 11/01/2023 9:08:53 AM    LHC 03/2023 1.  Widely patent left main with no significant stenosis 2.  Patent LAD with 40 to 50% proximal stenosis, nonobstructive disease appropriate for medical therapy 3.  Severe mid circumflex stenosis treated successfully with a 2.75 x 28 mm Synergy DES 4.  Large, dominant RCA with mild nonobstructive plaquing and moderate focal stenosis in a large posterolateral branch 5.  Normal LVEDP  Echo 01/2023 LVEF 55-60%, Grade 1 DD, trivial MR  Physical Exam:   VS:  BP 132/78 (BP Location: Left Arm, Patient Position: Sitting, Cuff Size: Normal)   Pulse 63   Ht 5\' 5"  (1.651 m)   Wt 184 lb 12.8 oz (83.8 kg)   SpO2 98%   BMI 30.75 kg/m    Wt Readings from Last 3 Encounters:   11/01/23 184 lb 12.8 oz (83.8 kg)  08/27/23 180 lb (81.6 kg)  08/04/23 186 lb (84.4 kg)     GEN: Well nourished, well developed in no acute distress NECK: No JVD; No carotid bruits CARDIAC: Regular rate and rhythm, no murmurs, rubs, gallops RESPIRATORY:  Clear to auscultation without rales, wheezing or rhonchi  ABDOMEN: Soft, non-tender, non-distended EXTREMITIES:  No edema; No deformity   ASSESSMENT AND PLAN:    Paroxysmal atrial fibrillation  No interval AF of which she is aware EKG today shows NSR Continue Eliquis 5 mg BID for CHA2DS2/VASc of at least 6.   Left bundle branch block Presyncope Follow   HTN Stable on current regimen      Coronary artery disease status post circumflex stenting and residual LAD disease nonobstructive (4/24) She states she stopped Plavix after 1 month, because that's all she was given.  She has been on and will continue Eliquis and 81 mg ASA.    Follow up with Dr. Graciela Husbands in 6 months  Signed, Graciella Freer, PA-C

## 2023-11-01 ENCOUNTER — Encounter: Payer: Self-pay | Admitting: Student

## 2023-11-01 ENCOUNTER — Ambulatory Visit: Payer: Medicare Other | Attending: Student | Admitting: Student

## 2023-11-01 VITALS — BP 132/78 | HR 63 | Ht 65.0 in | Wt 184.8 lb

## 2023-11-01 DIAGNOSIS — I480 Paroxysmal atrial fibrillation: Secondary | ICD-10-CM | POA: Diagnosis not present

## 2023-11-01 DIAGNOSIS — R002 Palpitations: Secondary | ICD-10-CM

## 2023-11-01 DIAGNOSIS — I10 Essential (primary) hypertension: Secondary | ICD-10-CM

## 2023-11-01 DIAGNOSIS — I25119 Atherosclerotic heart disease of native coronary artery with unspecified angina pectoris: Secondary | ICD-10-CM | POA: Diagnosis not present

## 2023-11-01 DIAGNOSIS — I447 Left bundle-branch block, unspecified: Secondary | ICD-10-CM

## 2023-11-01 NOTE — Patient Instructions (Signed)
Medication Instructions:  Your physician recommends that you continue on your current medications as directed. Please refer to the Current Medication list given to you today.  *If you need a refill on your cardiac medications before your next appointment, please call your pharmacy*  Lab Work: None ordered If you have labs (blood work) drawn today and your tests are completely normal, you will receive your results only by: MyChart Message (if you have MyChart) OR A paper copy in the mail If you have any lab test that is abnormal or we need to change your treatment, we will call you to review the results.  Follow-Up: At [facility], you and your health needs are our priority.  As part of our continuing mission to provide you with exceptional heart care, we have created designated Provider Care Teams.  These Care Teams include your primary Cardiologist (physician) and Advanced Practice Providers (APPs -  Physician Assistants and Nurse Practitioners) who all work together to provide you with the care you need, when you need it.  Your next appointment:   6 month(s)  Provider:   Sherryl Manges, MD

## 2023-11-18 ENCOUNTER — Encounter (HOSPITAL_COMMUNITY)
Admission: RE | Disposition: A | Discharge status: Home / Self Care | Payer: Self-pay | Source: Home / Self Care | Attending: Gastroenterology

## 2023-11-18 ENCOUNTER — Encounter (HOSPITAL_COMMUNITY): Payer: Self-pay | Admitting: Gastroenterology

## 2023-11-18 ENCOUNTER — Ambulatory Visit (HOSPITAL_COMMUNITY)
Admission: RE | Admit: 2023-11-18 | Discharge: 2023-11-18 | Disposition: A | Discharge status: Home / Self Care | Payer: Medicare Other | Attending: Gastroenterology | Admitting: Gastroenterology

## 2023-11-18 DIAGNOSIS — D649 Anemia, unspecified: Secondary | ICD-10-CM | POA: Diagnosis present

## 2023-11-18 DIAGNOSIS — D509 Iron deficiency anemia, unspecified: Secondary | ICD-10-CM | POA: Diagnosis not present

## 2023-11-18 HISTORY — PX: GIVENS CAPSULE STUDY: SHX5432

## 2023-11-18 SURGERY — IMAGING PROCEDURE, GI TRACT, INTRALUMINAL, VIA CAPSULE
Anesthesia: LOCAL

## 2023-11-18 SURGICAL SUPPLY — 1 items: TOWEL COTTON PACK 4EA (MISCELLANEOUS) ×4 IMPLANT

## 2023-11-18 NOTE — Progress Notes (Signed)
Pt swallowed Givens Capsule at 0815, instructions given with verbal understanding from pt. Pt will return monitor and leads 11/19/23 by 0800a. Weston Settle, RN

## 2023-11-21 ENCOUNTER — Encounter (HOSPITAL_COMMUNITY): Payer: Self-pay | Admitting: Gastroenterology

## 2023-12-26 ENCOUNTER — Other Ambulatory Visit: Payer: Self-pay | Admitting: Medical Genetics

## 2024-01-20 ENCOUNTER — Ambulatory Visit
Admission: RE | Admit: 2024-01-20 | Discharge: 2024-01-20 | Disposition: A | Discharge status: Home / Self Care | Payer: Medicare Other | Source: Ambulatory Visit | Attending: Nephrology | Admitting: Nephrology

## 2024-01-20 ENCOUNTER — Other Ambulatory Visit: Payer: Self-pay | Admitting: Nephrology

## 2024-01-20 DIAGNOSIS — N1831 Chronic kidney disease, stage 3a: Secondary | ICD-10-CM

## 2024-01-28 ENCOUNTER — Other Ambulatory Visit (HOSPITAL_COMMUNITY)
Admission: RE | Admit: 2024-01-28 | Discharge: 2024-01-28 | Disposition: A | Discharge status: Home / Self Care | Payer: Self-pay | Source: Ambulatory Visit | Attending: Oncology | Admitting: Oncology

## 2024-02-10 LAB — GENECONNECT MOLECULAR SCREEN: Genetic Analysis Overall Interpretation: NEGATIVE

## 2024-04-30 NOTE — Progress Notes (Unsigned)
 Cardiology Office Note:  .   Date:  04/30/2024  ID:  Destiny Brown, DOB 06-21-1946, MRN [id number] PCP: Rosslyn Coons, MD  [facility] Providers Cardiologist:  Richardo Chandler, MD Electrophysiologist:  Richardo Chandler, MD {  History of Present Illness: .   Destiny Brown is a 78 y.o. female w/PMHx of  HTN, hypothyroidism, OSA w/CPAP, DM, RA, asthma LBBB, CAD (PCI April 2024) AFib  She saw Jonelle Neri 11/01/23, doing well, active.  Today's visit is scheduled as a 6 mo visit ROS:   She is accompanied by her husband  Reports for about 6 mo she has been getting more winded with exertion associated with a "hurt" in her chest, will resolve with rest, aware of a pounding in her chest. These are not every time she is active/ambulating, not every day, she can do the same activity one time have feel them and not another times  She has for about a year noted swelling ankles/lower legs, fairly persistently  No rest symptoms  She does not formally exercise, but reports very busy all day most days  No near syncope or syncope No bleeding, signs of bleeding  AFib hx: Dx 2015 w/LBBB at that time AAD tx: no  Studies Reviewed: Aaron Aas    EKG done today and reviewed by myself SR 62bpm, LAD, LBBB, no acute changes  LHC 03/2023 1.  Widely patent left main with no significant stenosis 2.  Patent LAD with 40 to 50% proximal stenosis, nonobstructive disease appropriate for medical therapy 3.  Severe mid circumflex stenosis treated successfully with a 2.75 x 28 mm Synergy DES 4.  Large, dominant RCA with mild nonobstructive plaquing and moderate focal stenosis in a large posterolateral branch 5.  Normal LVEDP   Echo 01/2023 LVEF 55-60%, Grade 1 DD, trivial MR    09/22/14: TTE Study Conclusions - Left ventricle: The cavity size was normal. There was mild   concentric hypertrophy. Systolic function was normal. The   estimated ejection fraction was in the range of 50% to 55%. Wall   motion was  normal; there were no regional wall motion   abnormalities. Doppler parameters are consistent with abnormal   left ventricular relaxation (grade 1 diastolic dysfunction). - Aortic valve: Trileaflet; normal thickness leaflets. There was no   regurgitation. - Aortic root: The aortic root was normal in size. - Mitral valve: There was mild regurgitation. - Left atrium: The atrium was normal in size. - Right ventricle: Systolic function was normal. - Right atrium: The atrium was normal in size. - Tricuspid valve: There was trivial regurgitation. - Pulmonary arteries: Systolic pressure was within the normal   range. Impressions - Normal biventricular size and systolic function. Abnormal   relaxation with normal filling pressures. Mild mitral and   tricuspid regurgitation. Normal RVSP.   Normal left atrial size.   09/23/14: Lexiscan  stress  IMPRESSION: 1. No definite reversible ischemia or infarction. 2. Normal left ventricular wall motion. 3. Left ventricular ejection fraction 55% 4. Low-risk stress test findings*.   Risk Assessment/Calculations:    Physical Exam:   VS:  There were no vitals taken for this visit.   Wt Readings from Last 3 Encounters:  11/18/23 177 lb (80.3 kg)  11/01/23 184 lb 12.8 oz (83.8 kg)  08/27/23 180 lb (81.6 kg)    GEN: Well nourished, well developed in no acute distress NECK: No JVD; No carotid bruits CARDIAC: RRR, no murmurs, rubs, gallops RESPIRATORY:  CTA b/l without rales, wheezing or rhonchi  ABDOMEN: Soft, non-tender, non-distended EXTREMITIES: trace-1+ edema b/l LE to about mid shin; No deformity   ASSESSMENT AND PLAN: .    paroxysmal AFib CHA2DS2Vasc is 6, on Eliquis ,  appropriately dosed Intermittent awareness of a pounding type heart beat Monitor labs  HTN Better at home  CAD DOE with som chest discomfort as discussed above Plan stress PET ER precautions discussed Needs gen cards team> she has seen Donalynn Fry  before  LBBB Preserved LVEF by echo 2024  Secondary hypercoagulable state 2/2 AFib  6. Edema Start low dose lasix 20mg  daily   Informed Consent   Shared Decision Making/Informed Consent The risks [chest pain, shortness of breath, cardiac arrhythmias, dizziness, blood pressure fluctuations, myocardial infarction, stroke/transient ischemic attack, nausea, vomiting, allergic reaction, radiation exposure, metallic taste sensation and life-threatening complications (estimated to be 1 in 10,000)], benefits (risk stratification, diagnosing coronary artery disease, treatment guidance) and alternatives of a cardiac PET stress test were discussed in detail with Ms. Schoenfelder and she agrees to proceed.   Dispo: needs general cardiologist, should see them ASAP, plans above, otherwise with EP again in 3-58mo, sooner if needed   Signed, Debbie Fails, PA-C

## 2024-05-03 ENCOUNTER — Ambulatory Visit: Attending: Physician Assistant | Admitting: Physician Assistant

## 2024-05-03 ENCOUNTER — Encounter: Payer: Self-pay | Admitting: Physician Assistant

## 2024-05-03 ENCOUNTER — Ambulatory Visit

## 2024-05-03 VITALS — BP 164/62 | HR 65 | Ht 65.0 in | Wt 189.0 lb

## 2024-05-03 DIAGNOSIS — R079 Chest pain, unspecified: Secondary | ICD-10-CM | POA: Diagnosis not present

## 2024-05-03 DIAGNOSIS — R0600 Dyspnea, unspecified: Secondary | ICD-10-CM

## 2024-05-03 DIAGNOSIS — I10 Essential (primary) hypertension: Secondary | ICD-10-CM

## 2024-05-03 DIAGNOSIS — R609 Edema, unspecified: Secondary | ICD-10-CM | POA: Diagnosis not present

## 2024-05-03 DIAGNOSIS — I480 Paroxysmal atrial fibrillation: Secondary | ICD-10-CM | POA: Diagnosis not present

## 2024-05-03 DIAGNOSIS — I2510 Atherosclerotic heart disease of native coronary artery without angina pectoris: Secondary | ICD-10-CM

## 2024-05-03 DIAGNOSIS — D6869 Other thrombophilia: Secondary | ICD-10-CM

## 2024-05-03 DIAGNOSIS — I447 Left bundle-branch block, unspecified: Secondary | ICD-10-CM

## 2024-05-03 MED ORDER — FUROSEMIDE 20 MG PO TABS: 20.0000 mg | ORAL_TABLET | Freq: Every day | ORAL | 3 refills | Status: DC

## 2024-05-03 NOTE — Progress Notes (Unsigned)
 Enrolled patient for a 14 day Zio XT monitor to be mailed to patients home  Destiny Brown to read

## 2024-05-03 NOTE — Patient Instructions (Addendum)
 Medication Instructions:    START TAKING : FUROSEMIDE ( LASIX)   *If you need a refill on your cardiac medications before your next appointment, please call your pharmacy*  Lab Work:  CMET  LIPIDS AND CBC TODAY    If you have labs (blood work) drawn today and your tests are completely normal, you will receive your results only by: MyChart Message (if you have MyChart) OR A paper copy in the mail If you have any lab test that is abnormal or we need to change your treatment, we will call you to review the results.  Testing/Procedures: Your physician has requested that you have an echocardiogram. Echocardiography is a painless test that uses sound waves to create images of your heart. It provides your doctor with information about the size and shape of your heart and how well your heart's chambers and valves are working. This procedure takes approximately one hour. There are no restrictions for this procedure. Please do NOT wear cologne, perfume, aftershave, or lotions (deodorant is allowed). Please arrive 15 minutes prior to your appointment time.  Please note: We ask at that you not bring children with you during ultrasound (echo/ vascular) testing. Due to room size and safety concerns, children are not allowed in the ultrasound rooms during exams. Our front office staff cannot provide observation of children in our lobby area while testing is being conducted. An adult accompanying a patient to their appointment will only be allowed in the ultrasound room at the discretion of the ultrasound technician under special circumstances. We apologize for any inconvenience.  YOU HAVE BEEN RECOMMENDED FOR A MYOCARDIAL PET      Please report to Radiology at the [facility] Main Entrance 30 minutes early for your test.  [address] [place], [place] [address]                         OR   Please report to Radiology at [facility] Main Entrance, medical mall, 30 mins  prior to your test.  [address]  [place], [place]  How to Prepare for Your Cardiac PET/CT Stress Test:  Nothing to eat or drink, except water , 3 hours prior to arrival time.  NO caffeine/decaffeinated products, or chocolate 12 hours prior to arrival. (Please note decaffeinated beverages (teas/coffees) still contain caffeine).  If you have caffeine within 12 hours prior, the test will need to be rescheduled.  Medication instructions: Do not take erectile dysfunction medications for 72 hours prior to test (sildenafil, tadalafil) Do not take nitrates (isosorbide mononitrate, Ranexa) the day before or day of test Do not take tamsulosin the day before or morning of test Hold theophylline containing medications for 12 hours. Hold Dipyridamole 48 hours prior to the test.  Diabetic Preparation: If able to eat breakfast prior to 3 hour fasting, you may take all medications, including your insulin . Do not worry if you miss your breakfast dose of insulin  - start at your next meal. If you do not eat prior to 3 hour fast-Hold all diabetes (oral and insulin ) medications. Patients who wear a continuous glucose monitor MUST remove the device prior to scanning.  You may take your remaining medications with water .  NO perfume, cologne or lotion on chest or abdomen area. FEMALES - Please avoid wearing dresses to this appointment.  Total time is 1 to 2 hours; you may want to bring reading material for the waiting time.  IF YOU THINK  YOU MAY BE PREGNANT, OR ARE NURSING PLEASE INFORM THE TECHNOLOGIST.  In preparation for your appointment, medication and supplies will be purchased.  Appointment availability is limited, so if you need to cancel or reschedule, please call the Radiology Department Scheduler at [phone] 24 hours in advance to avoid a cancellation fee of $100.00  What to Expect When you Arrive:  Once you arrive and check in for your appointment, you will be taken to a preparation  room within the Radiology Department.  A technologist or Nurse will obtain your medical history, verify that you are correctly prepped for the exam, and explain the procedure.  Afterwards, an IV will be started in your arm and electrodes will be placed on your skin for EKG monitoring during the stress portion of the exam. Then you will be escorted to the PET/CT scanner.  There, staff will get you positioned on the scanner and obtain a blood pressure and EKG.  During the exam, you will continue to be connected to the EKG and blood pressure machines.  A small, safe amount of a radioactive tracer will be injected in your IV to obtain a series of pictures of your heart along with an injection of a stress agent.    After your Exam:  It is recommended that you eat a meal and drink a caffeinated beverage to counter act any effects of the stress agent.  Drink plenty of fluids for the remainder of the day and urinate frequently for the first couple of hours after the exam.  Your doctor will inform you of your test results within 7-10 business days.  For more information and frequently asked questions, please visit our website: [email or web address]  For questions about your test or how to prepare for your test, please call: Cardiac Imaging Nurse Navigators Office: [phone]   Your physician has recommended that you wear an event monitor. Event monitors are medical devices that record the heart's electrical activity. Doctors most often us  these monitors to diagnose arrhythmias. Arrhythmias are problems with the speed or rhythm of the heartbeat. The monitor is a small, portable device. You can wear one while you do your normal daily activities. This is usually used to diagnose what is causing palpitations/syncope (passing out).   Follow-Up: At [facility], you and your health needs are our priority.  As part of our continuing mission to provide you with exceptional heart care, our  providers are all part of one team.  This team includes your primary Cardiologist (physician) and Advanced Practice Providers or APPs (Physician Assistants and Nurse Practitioners) who all work together to provide you with the care you need, when you need it.  Your next appointment:   ASAP  WITH  APP/ Destiny Brown  Provider:    Mertha Abrahams, PA-C    IN 3 TO 4 MONTHS     We recommend signing up for the patient portal called "MyChart".  Sign up information is provided on this After Visit Summary.  MyChart is used to connect with patients for Virtual Visits (Telemedicine).  Patients are able to view lab/test results, encounter notes, upcoming appointments, etc.  Non-urgent messages can be sent to your provider as well.   To learn more about what you can do with MyChart, go to [email or web address].   Other Instructions  ZIO XT- Long Term Monitor Instructions  Your physician has requested you wear a ZIO patch monitor for 14 days.  This is a single patch monitor. [employer] supplies one patch monitor per  enrollment. Additional stickers are not available. Please do not apply patch if you will be having a Nuclear Stress Test,  Echocardiogram, Cardiac CT, MRI, or Chest Xray during the period you would be wearing the  monitor. The patch cannot be worn during these tests. You cannot remove and re-apply the  ZIO XT patch monitor.  Your ZIO patch monitor will be mailed 3 day USPS to your address on file. It may take 3-5 days  to receive your monitor after you have been enrolled.  Once you have received your monitor, please review the enclosed instructions. Your monitor  has already been registered assigning a specific monitor serial # to you.  Billing and Patient Assistance Program Information  We have supplied [employer] with any of your insurance information on file for billing purposes. [employer] offers a sliding scale Patient Assistance Program for patients that do not have  insurance, or whose  insurance does not completely cover the cost of the ZIO monitor.  You must apply for the Patient Assistance Program to qualify for this discounted rate.  To apply, please call [employer] at [phone], select option 4, select option 2, ask to apply for  Patient Assistance Program. Sanna Crystal will ask your household income, and how many people  are in your household. They will quote your out-of-pocket cost based on that information.  [employer] will also be able to set up a 35-month, interest-free payment plan if needed.  Applying the monitor   Shave hair from upper left chest.  Hold abrader disc by orange tab. Rub abrader in 40 strokes over the upper left chest as  indicated in your monitor instructions.  Clean area with 4 enclosed alcohol pads. Let dry.  Apply patch as indicated in monitor instructions. Patch will be placed under collarbone on left  side of chest with arrow pointing upward.  Rub patch adhesive wings for 2 minutes. Remove white label marked "1". Remove the white  label marked "2". Rub patch adhesive wings for 2 additional minutes.  While looking in a mirror, press and release button in center of patch. A small green light will  flash 3-4 times. This will be your only indicator that the monitor has been turned on.  Do not shower for the first 24 hours. You may shower after the first 24 hours.  Press the button if you feel a symptom. You will hear a small click. Record Date, Time and  Symptom in the Patient Logbook.  When you are ready to remove the patch, follow instructions on the last 2 pages of Patient  Logbook. Stick patch monitor onto the last page of Patient Logbook.  Place Patient Logbook in the blue and white box. Use locking tab on box and tape box closed  securely. The blue and white box has prepaid postage on it. Please place it in the mailbox as  soon as possible. Your physician should have your test results approximately 7 days after the  monitor has been mailed back  to [facility].  Call [facility] Customer Care at [phone] if you have questions regarding  your ZIO XT patch monitor. Call them immediately if you see an orange light blinking on your  monitor.  If your monitor falls off in less than 4 days, contact our Monitor department at [phone].  If your monitor becomes loose or falls off after 4 days call [employer] at [phone] for  suggestions on securing your monitor

## 2024-05-04 LAB — LIPID PANEL
Chol/HDL Ratio: 2.4 ratio (ref 0.0–4.4)
Cholesterol, Total: 157 mg/dL (ref 100–199)
HDL: 66 mg/dL (ref >39)
LDL Chol Calc (NIH): 62 mg/dL (ref 0–99)
Triglycerides: 177 mg/dL — ABNORMAL HIGH (ref 0–149)
VLDL Cholesterol Cal: 29 mg/dL (ref 5–40)

## 2024-05-04 LAB — COMPREHENSIVE METABOLIC PANEL WITH GFR
ALT: 14 IU/L (ref 0–32)
AST: 16 IU/L (ref 0–40)
Albumin: 4.4 g/dL (ref 3.8–4.8)
Alkaline Phosphatase: 104 IU/L (ref 44–121)
BUN/Creatinine Ratio: 19 (ref 12–28)
BUN: 22 mg/dL (ref 8–27)
Bilirubin Total: 0.4 mg/dL (ref 0.0–1.2)
CO2: 20 mmol/L (ref 20–29)
Calcium: 9.7 mg/dL (ref 8.7–10.3)
Chloride: 101 mmol/L (ref 96–106)
Creatinine, Ser: 1.18 mg/dL — ABNORMAL HIGH (ref 0.57–1.00)
Globulin, Total: 2.6 g/dL (ref 1.5–4.5)
Glucose: 132 mg/dL — ABNORMAL HIGH (ref 70–99)
Potassium: 4.7 mmol/L (ref 3.5–5.2)
Sodium: 138 mmol/L (ref 134–144)
Total Protein: 7 g/dL (ref 6.0–8.5)
eGFR: 47 mL/min/{1.73_m2} — ABNORMAL LOW (ref >59)

## 2024-05-04 LAB — CBC
Hematocrit: 39.6 % (ref 34.0–46.6)
Hemoglobin: 12.5 g/dL (ref 11.1–15.9)
MCH: 27.4 pg (ref 26.6–33.0)
MCHC: 31.6 g/dL (ref 31.5–35.7)
MCV: 87 fL (ref 79–97)
Platelets: 267 10*3/uL (ref 150–450)
RBC: 4.57 x10E6/uL (ref 3.77–5.28)
RDW: 12.9 % (ref 11.7–15.4)
WBC: 5.8 10*3/uL (ref 3.4–10.8)

## 2024-05-08 ENCOUNTER — Encounter (HOSPITAL_COMMUNITY): Payer: Self-pay

## 2024-05-08 ENCOUNTER — Telehealth (HOSPITAL_COMMUNITY): Payer: Self-pay | Admitting: *Deleted

## 2024-05-08 NOTE — Telephone Encounter (Signed)

## 2024-05-09 ENCOUNTER — Other Ambulatory Visit: Payer: Self-pay

## 2024-05-09 ENCOUNTER — Encounter (HOSPITAL_COMMUNITY): Payer: Self-pay

## 2024-05-09 ENCOUNTER — Emergency Department (HOSPITAL_COMMUNITY)

## 2024-05-09 ENCOUNTER — Ambulatory Visit (HOSPITAL_BASED_OUTPATIENT_CLINIC_OR_DEPARTMENT_OTHER)
Admission: RE | Admit: 2024-05-09 | Discharge: 2024-05-09 | Disposition: A | Discharge status: Home / Self Care | Source: Ambulatory Visit | Attending: Physician Assistant | Admitting: Physician Assistant

## 2024-05-09 ENCOUNTER — Inpatient Hospital Stay (HOSPITAL_COMMUNITY)
Admission: EM | Admit: 2024-05-09 | Discharge: 2024-05-12 | DRG: 287 | Disposition: A | Discharge status: Home / Self Care | Attending: Internal Medicine | Admitting: Internal Medicine

## 2024-05-09 DIAGNOSIS — I4819 Other persistent atrial fibrillation: Secondary | ICD-10-CM | POA: Diagnosis present

## 2024-05-09 DIAGNOSIS — E079 Disorder of thyroid, unspecified: Secondary | ICD-10-CM

## 2024-05-09 DIAGNOSIS — E1140 Type 2 diabetes mellitus with diabetic neuropathy, unspecified: Secondary | ICD-10-CM | POA: Diagnosis present

## 2024-05-09 DIAGNOSIS — Z7901 Long term (current) use of anticoagulants: Secondary | ICD-10-CM

## 2024-05-09 DIAGNOSIS — E0580 Other thyrotoxicosis without thyrotoxic crisis or storm: Secondary | ICD-10-CM | POA: Diagnosis present

## 2024-05-09 DIAGNOSIS — Z79899 Other long term (current) drug therapy: Secondary | ICD-10-CM | POA: Diagnosis not present

## 2024-05-09 DIAGNOSIS — I4891 Unspecified atrial fibrillation: Principal | ICD-10-CM | POA: Diagnosis present

## 2024-05-09 DIAGNOSIS — T381X5A Adverse effect of thyroid hormones and substitutes, initial encounter: Secondary | ICD-10-CM | POA: Diagnosis present

## 2024-05-09 DIAGNOSIS — I447 Left bundle-branch block, unspecified: Secondary | ICD-10-CM | POA: Diagnosis present

## 2024-05-09 DIAGNOSIS — E66811 Obesity, class 1: Secondary | ICD-10-CM | POA: Diagnosis present

## 2024-05-09 DIAGNOSIS — Z23 Encounter for immunization: Secondary | ICD-10-CM

## 2024-05-09 DIAGNOSIS — Z7982 Long term (current) use of aspirin: Secondary | ICD-10-CM

## 2024-05-09 DIAGNOSIS — Z96651 Presence of right artificial knee joint: Secondary | ICD-10-CM | POA: Diagnosis present

## 2024-05-09 DIAGNOSIS — E871 Hypo-osmolality and hyponatremia: Secondary | ICD-10-CM | POA: Diagnosis present

## 2024-05-09 DIAGNOSIS — Z955 Presence of coronary angioplasty implant and graft: Secondary | ICD-10-CM

## 2024-05-09 DIAGNOSIS — E039 Hypothyroidism, unspecified: Secondary | ICD-10-CM | POA: Diagnosis not present

## 2024-05-09 DIAGNOSIS — Z961 Presence of intraocular lens: Secondary | ICD-10-CM | POA: Diagnosis present

## 2024-05-09 DIAGNOSIS — I2510 Atherosclerotic heart disease of native coronary artery without angina pectoris: Secondary | ICD-10-CM | POA: Diagnosis not present

## 2024-05-09 DIAGNOSIS — G4733 Obstructive sleep apnea (adult) (pediatric): Secondary | ICD-10-CM | POA: Diagnosis present

## 2024-05-09 DIAGNOSIS — Z90710 Acquired absence of both cervix and uterus: Secondary | ICD-10-CM

## 2024-05-09 DIAGNOSIS — Z6830 Body mass index (BMI) 30.0-30.9, adult: Secondary | ICD-10-CM

## 2024-05-09 DIAGNOSIS — I25118 Atherosclerotic heart disease of native coronary artery with other forms of angina pectoris: Secondary | ICD-10-CM | POA: Diagnosis present

## 2024-05-09 DIAGNOSIS — I472 Ventricular tachycardia, unspecified: Secondary | ICD-10-CM | POA: Diagnosis present

## 2024-05-09 DIAGNOSIS — E785 Hyperlipidemia, unspecified: Secondary | ICD-10-CM | POA: Diagnosis present

## 2024-05-09 DIAGNOSIS — N1831 Chronic kidney disease, stage 3a: Secondary | ICD-10-CM | POA: Diagnosis present

## 2024-05-09 DIAGNOSIS — E0590 Thyrotoxicosis, unspecified without thyrotoxic crisis or storm: Secondary | ICD-10-CM | POA: Diagnosis not present

## 2024-05-09 DIAGNOSIS — Z5181 Encounter for therapeutic drug level monitoring: Secondary | ICD-10-CM | POA: Diagnosis not present

## 2024-05-09 DIAGNOSIS — Z981 Arthrodesis status: Secondary | ICD-10-CM

## 2024-05-09 DIAGNOSIS — R931 Abnormal findings on diagnostic imaging of heart and coronary circulation: Secondary | ICD-10-CM | POA: Diagnosis not present

## 2024-05-09 DIAGNOSIS — I10 Essential (primary) hypertension: Secondary | ICD-10-CM | POA: Diagnosis not present

## 2024-05-09 DIAGNOSIS — R000 Tachycardia, unspecified: Secondary | ICD-10-CM

## 2024-05-09 DIAGNOSIS — R079 Chest pain, unspecified: Secondary | ICD-10-CM | POA: Insufficient documentation

## 2024-05-09 DIAGNOSIS — Z9049 Acquired absence of other specified parts of digestive tract: Secondary | ICD-10-CM

## 2024-05-09 DIAGNOSIS — R001 Bradycardia, unspecified: Secondary | ICD-10-CM | POA: Diagnosis not present

## 2024-05-09 DIAGNOSIS — E1122 Type 2 diabetes mellitus with diabetic chronic kidney disease: Secondary | ICD-10-CM | POA: Diagnosis present

## 2024-05-09 DIAGNOSIS — Z96611 Presence of right artificial shoulder joint: Secondary | ICD-10-CM | POA: Diagnosis present

## 2024-05-09 DIAGNOSIS — I214 Non-ST elevation (NSTEMI) myocardial infarction: Secondary | ICD-10-CM | POA: Diagnosis not present

## 2024-05-09 DIAGNOSIS — Z79890 Hormone replacement therapy (postmenopausal): Secondary | ICD-10-CM

## 2024-05-09 DIAGNOSIS — I129 Hypertensive chronic kidney disease with stage 1 through stage 4 chronic kidney disease, or unspecified chronic kidney disease: Secondary | ICD-10-CM | POA: Diagnosis present

## 2024-05-09 DIAGNOSIS — Z7984 Long term (current) use of oral hypoglycemic drugs: Secondary | ICD-10-CM | POA: Diagnosis not present

## 2024-05-09 DIAGNOSIS — Z885 Allergy status to narcotic agent status: Secondary | ICD-10-CM

## 2024-05-09 LAB — CBC
HCT: 42.7 % (ref 36.0–46.0)
Hemoglobin: 13.8 g/dL (ref 12.0–15.0)
MCH: 27.7 pg (ref 26.0–34.0)
MCHC: 32.3 g/dL (ref 30.0–36.0)
MCV: 85.7 fL (ref 80.0–100.0)
Platelets: 250 10*3/uL (ref 150–400)
RBC: 4.98 MIL/uL (ref 3.87–5.11)
RDW: 13 % (ref 11.5–15.5)
WBC: 6.5 10*3/uL (ref 4.0–10.5)
nRBC: 0 % (ref 0.0–0.2)

## 2024-05-09 LAB — BASIC METABOLIC PANEL WITH GFR
Anion gap: 10 (ref 5–15)
BUN: 30 mg/dL — ABNORMAL HIGH (ref 8–23)
CO2: 23 mmol/L (ref 22–32)
Calcium: 9.8 mg/dL (ref 8.9–10.3)
Chloride: 100 mmol/L (ref 98–111)
Creatinine, Ser: 1.16 mg/dL — ABNORMAL HIGH (ref 0.44–1.00)
GFR, Estimated: 48 mL/min — ABNORMAL LOW (ref >60)
Glucose, Bld: 166 mg/dL — ABNORMAL HIGH (ref 70–99)
Potassium: 4.1 mmol/L (ref 3.5–5.1)
Sodium: 133 mmol/L — ABNORMAL LOW (ref 135–145)

## 2024-05-09 LAB — CBG MONITORING, ED
Glucose-Capillary: 134 mg/dL — ABNORMAL HIGH (ref 70–99)
Glucose-Capillary: 124 mg/dL — ABNORMAL HIGH (ref 70–99)
Glucose-Capillary: 106 mg/dL — ABNORMAL HIGH (ref 70–99)

## 2024-05-09 LAB — NM PET CT CARDIAC PERFUSION MULTI W/ABSOLUTE BLOODFLOW
MBFR: 1.58
Nuc Rest EF: 35 %
Nuc Stress EF: 42 %
Rest MBF: 1.2 ml/g/min
Rest Nuclear Isotope Dose: 22.1 mCi
ST Depression (mm): 0 mm
Stress MBF: 1.9 ml/g/min
Stress Nuclear Isotope Dose: 22.1 mCi
TID: 0.93

## 2024-05-09 LAB — I-STAT CHEM 8, ED
BUN: 28 mg/dL — ABNORMAL HIGH (ref 8–23)
Calcium, Ion: 1.23 mmol/L (ref 1.15–1.40)
Chloride: 104 mmol/L (ref 98–111)
Creatinine, Ser: 1.3 mg/dL — ABNORMAL HIGH (ref 0.44–1.00)
Glucose, Bld: 163 mg/dL — ABNORMAL HIGH (ref 70–99)
HCT: 43 % (ref 36.0–46.0)
Hemoglobin: 14.6 g/dL (ref 12.0–15.0)
Potassium: 4.2 mmol/L (ref 3.5–5.1)
Sodium: 137 mmol/L (ref 135–145)
TCO2: 24 mmol/L (ref 22–32)

## 2024-05-09 LAB — HEPARIN LEVEL (UNFRACTIONATED): Heparin Unfractionated: >1.1 [IU]/mL — ABNORMAL HIGH (ref 0.30–0.70)

## 2024-05-09 LAB — APTT: aPTT: 99 s — ABNORMAL HIGH (ref 24–36)

## 2024-05-09 MED ORDER — INSULIN ASPART 100 UNIT/ML IJ SOLN
0.0000 [IU] | Freq: Three times a day (TID) | INTRAMUSCULAR | Status: DC
  Administered 2024-05-09 – 2024-05-10 (×3): 2 [IU] via SUBCUTANEOUS
  Administered 2024-05-10 – 2024-05-12 (×2): 3 [IU] via SUBCUTANEOUS
  Filled 2024-05-09: qty 0.15

## 2024-05-09 MED ORDER — HEPARIN (PORCINE) 25000 UT/250ML-% IV SOLN
1300.0000 [IU]/h | INTRAVENOUS | Status: DC
  Administered 2024-05-09: 1100 [IU]/h via INTRAVENOUS
  Administered 2024-05-11: 1300 [IU]/h via INTRAVENOUS
  Filled 2024-05-09 (×3): qty 250

## 2024-05-09 MED ORDER — ROSUVASTATIN CALCIUM 5 MG PO TABS
5.0000 mg | ORAL_TABLET | ORAL | Status: DC
Start: 2024-05-11 — End: 2024-05-12
  Administered 2024-05-11: 5 mg via ORAL
  Filled 2024-05-09: qty 1

## 2024-05-09 MED ORDER — TRAZODONE HCL 50 MG PO TABS: 25.0000 mg | ORAL_TABLET | Freq: Every evening | ORAL | Status: DC | PRN

## 2024-05-09 MED ORDER — ASPIRIN 81 MG PO TBEC
81.0000 mg | DELAYED_RELEASE_TABLET | Freq: Every day | ORAL | Status: DC
  Administered 2024-05-09 – 2024-05-12 (×4): 81 mg via ORAL
  Filled 2024-05-09 (×4): qty 1

## 2024-05-09 MED ORDER — PROPOFOL 10 MG/ML IV BOLUS
0.5000 mg/kg | Freq: Once | INTRAVENOUS | Status: AC
  Administered 2024-05-09: 42 mg via INTRAVENOUS
  Filled 2024-05-09: qty 20

## 2024-05-09 MED ORDER — METOPROLOL TARTRATE 25 MG PO TABS
25.0000 mg | ORAL_TABLET | Freq: Two times a day (BID) | ORAL | Status: DC
  Administered 2024-05-09 – 2024-05-11 (×3): 25 mg via ORAL
  Filled 2024-05-09 (×4): qty 1

## 2024-05-09 MED ORDER — ACETAMINOPHEN 325 MG PO TABS: 650.0000 mg | ORAL_TABLET | Freq: Four times a day (QID) | ORAL | Status: DC | PRN

## 2024-05-09 MED ORDER — ONDANSETRON HCL 4 MG/2ML IJ SOLN: 4.0000 mg | Freq: Four times a day (QID) | INTRAMUSCULAR | Status: DC | PRN

## 2024-05-09 MED ORDER — DILTIAZEM HCL-DEXTROSE 125-5 MG/125ML-% IV SOLN (PREMIX)
5.0000 mg/h | INTRAVENOUS | Status: DC
  Administered 2024-05-09: 5 mg/h via INTRAVENOUS
  Filled 2024-05-09 (×2): qty 125

## 2024-05-09 MED ORDER — DILTIAZEM LOAD VIA INFUSION
10.0000 mg | Freq: Once | INTRAVENOUS | Status: AC
  Administered 2024-05-09: 10 mg via INTRAVENOUS
  Filled 2024-05-09: qty 10

## 2024-05-09 MED ORDER — REGADENOSON 0.4 MG/5ML IV SOLN
0.4000 mg | Freq: Once | INTRAVENOUS | Status: AC
  Administered 2024-05-09: 0.4 mg via INTRAVENOUS

## 2024-05-09 MED ORDER — ACETAMINOPHEN 650 MG RE SUPP: 650.0000 mg | Freq: Four times a day (QID) | RECTAL | Status: DC | PRN

## 2024-05-09 MED ORDER — INSULIN ASPART 100 UNIT/ML IJ SOLN
0.0000 [IU] | Freq: Every day | INTRAMUSCULAR | Status: DC
  Filled 2024-05-09: qty 0.05

## 2024-05-09 MED ORDER — APIXABAN 5 MG PO TABS: 5.0000 mg | ORAL_TABLET | Freq: Two times a day (BID) | ORAL | Status: DC

## 2024-05-09 MED ORDER — PROPOFOL 10 MG/ML IV BOLUS
INTRAVENOUS | Status: DC | PRN
  Administered 2024-05-09: 50 mg via INTRAVENOUS

## 2024-05-09 MED ORDER — ONDANSETRON HCL 4 MG PO TABS: 4.0000 mg | ORAL_TABLET | Freq: Four times a day (QID) | ORAL | Status: DC | PRN

## 2024-05-09 MED ORDER — RUBIDIUM RB82 GENERATOR (RUBYFILL)
22.1000 | PACK | Freq: Once | INTRAVENOUS | Status: AC
Start: 2024-05-09 — End: 2024-05-09
  Administered 2024-05-09: 22.1 via INTRAVENOUS

## 2024-05-09 MED ORDER — RUBIDIUM RB82 GENERATOR (RUBYFILL)
22.1000 | PACK | Freq: Once | INTRAVENOUS | Status: AC
  Administered 2024-05-09: 22.1 via INTRAVENOUS

## 2024-05-09 MED ORDER — LEVOTHYROXINE SODIUM 25 MCG PO TABS
125.0000 ug | ORAL_TABLET | Freq: Every day | ORAL | Status: DC
  Administered 2024-05-10 – 2024-05-11 (×2): 125 ug via ORAL
  Filled 2024-05-09 (×2): qty 1

## 2024-05-09 NOTE — Progress Notes (Signed)
 Patient endorsing dizziness post nuc medicine cardiac stress test with accompanied nausea, nno vomiting at this time. Re-attached to bedside monitor, HR in the 160s Afib with RVR. BP 130s systolic. Notified ED charge of symptoms. No complaints of chest pain at this time.

## 2024-05-09 NOTE — CV Procedure (Signed)
   DIRECT CURRENT CARDIOVERSION  NAME:  Destiny Brown    MRN: [id number] DOB:  Mar 21, 1946    ADMIT DATE: 05/09/2024  Indication:  Symptomatic Wide-complex tachycardia concerning for A-fib with RVR with aberrancy/ventricular tachycardia  Procedure Note:  The patient signed informed consent.  They have had had therapeutic anticoagulation with greater than 3 weeks.  Anesthesia was administered by Dr. Synetta Eves (ED physician).  Adequate airway was maintained throughout and vital followed per protocol.  They were cardioverted x 1 with 200J of biphasic synchronized energy.  They converted to NSR.  There were no apparent complications.  The patient had normal neuro status and respiratory status post procedure with vitals stable as recorded elsewhere.    Follow up:  See consultation note. Will be transferred to [facility] for acuity of care Updated primary attending physician. Family/husband updated.  Will follow with you  Olinda Bertrand, DO, [facility] [place]  [facility]  [phone] 3:58 PM

## 2024-05-09 NOTE — ED Provider Notes (Addendum)
 [facility] AT [facility] Provider Note   CSN: [id number] Arrival date & time: 05/09/24  4098     History  Chief Complaint  Patient presents with   Tachycardia    Destiny Brown is a 78 y.o. female.  HPI 78 year old female history of A-fib, on chronic anticoagulation who was having a nuclear medicine CT cardiac today.  She had completed the study when she began having a rapid rate.  She feels somewhat lightheaded and weak.  She is not having chest pain or dyspnea.  She is not taking any rate control medications.    Home Medications Prior to Admission medications   Medication Sig Start Date End Date Taking? Authorizing Provider  albuterol  (VENTOLIN  HFA) 108 (90 Base) MCG/ACT inhaler Inhale 2 puffs into the lungs every 6 (six) hours as needed for wheezing or shortness of breath (Cough). 11/21/22  Yes Eloise Hake Scales, PA-C  amLODipine  (NORVASC ) 5 MG tablet Take 5 mg by mouth in the morning. 12/14/16  Yes [provider]  apixaban  (ELIQUIS ) 5 MG TABS tablet Take 1 tablet (5 mg total) by mouth 2 (two) times daily. 05/03/23  Yes Verona Goodwill, MD  aspirin  EC 81 MG tablet Take 81 mg by mouth daily. 09/29/23  Yes [provider]  Cholecalciferol  (VITAMIN D3) 1000 UNITS CAPS Take 1,000 Units by mouth in the morning.   Yes [provider]  cyanocobalamin (VITAMIN B12) 500 MCG tablet Take 500 mcg by mouth daily.   Yes [provider]  furosemide  (LASIX ) 20 MG tablet Take 1 tablet (20 mg total) by mouth daily. 05/03/24 08/01/24 Yes Debbie Fails, PA-C  hydrochlorothiazide (HYDRODIURIL) 25 MG tablet Take 25 mg by mouth in the morning.   Yes [provider]  latanoprost  (XALATAN ) 0.005 % ophthalmic solution Place 1 drop into both eyes daily at 12 noon. 04/11/23  Yes [provider]  levothyroxine  (SYNTHROID , LEVOTHROID) 125 MCG tablet Take 125 mcg by mouth daily before breakfast.   Yes [provider]  losartan  (COZAAR ) 100 MG tablet Take 1 tablet (100 mg total) by mouth daily. 05/03/23  Yes Verona Goodwill, MD  metFORMIN  (GLUCOPHAGE ) 1000 MG tablet Take 1,000 mg by mouth 2 (two) times daily.   Yes [provider]  rosuvastatin (CRESTOR) 5 MG tablet Take 5 mg by mouth 2 (two) times a week. Tuesdays & Thursdays.   Yes [provider]  cyanocobalamin (FINEST NUTRITION VITAMIN B-12) 500 MCG tablet  01/03/24   [provider]  pregabalin (LYRICA) 75 MG capsule Take 75 mg by mouth 2 (two) times daily.    [provider]      Allergies    Oxycodone and Codeine    Review of Systems   Review of Systems  Physical Exam Updated Vital Signs BP (!) 110/94   Pulse (!) 58   Temp 97.9 F (36.6 C) (Oral)   Resp (!) 28   Ht 1.651 m (5\' 5" )   Wt 83.9 kg   SpO2 99%   BMI 30.79 kg/m  Physical Exam Vitals reviewed.  Constitutional:      General: She is not in acute distress.    Appearance: Normal appearance.  HENT:     Head: Normocephalic.     Right Ear: External ear normal.     Nose: Nose normal.     Mouth/Throat:     Pharynx: Oropharynx is clear.  Eyes:     Pupils: Pupils are equal, round, and  reactive to light.  Cardiovascular:     Rate and Rhythm: Tachycardia present. Rhythm irregular.     Pulses: Normal pulses.  Pulmonary:     Effort: Pulmonary effort is normal.     Breath sounds: Normal breath sounds.  Abdominal:     Palpations: Abdomen is soft.  Musculoskeletal:        General: Normal range of motion.     Cervical back: Normal range of motion.  Skin:    General: Skin is warm and dry.     Capillary Refill: Capillary refill takes less than 2 seconds.  Neurological:     General: No focal deficit present.     Mental Status: She is alert.  Psychiatric:        Mood and Affect: Mood normal.     ED Results / Procedures / Treatments   Labs (all labs ordered are listed, but only abnormal results are displayed) Labs Reviewed   BASIC METABOLIC PANEL WITH GFR - Abnormal; Notable for the following components:      Result Value   Sodium 133 (*)    Glucose, Bld 166 (*)    BUN 30 (*)    Creatinine, Ser 1.16 (*)    GFR, Estimated 48 (*)    All other components within normal limits  I-STAT CHEM 8, ED - Abnormal; Notable for the following components:   BUN 28 (*)    Creatinine, Ser 1.30 (*)    Glucose, Bld 163 (*)    All other components within normal limits  CBG MONITORING, ED - Abnormal; Notable for the following components:   Glucose-Capillary 134 (*)    All other components within normal limits  CBC  TSH  APTT  HEPARIN  LEVEL (UNFRACTIONATED)    EKG EKG Interpretation Date/Time:  Tuesday May 09 2024 10:27:46 EDT Ventricular Rate:  136 PR Interval:  129 QRS Duration:  143 QT Interval:  316 QTC Calculation: 476 R Axis:   237  Text Interpretation: Extreme tachycardia with wide complex, no further rhythm analysis attempted Confirmed by Auston Blush [id number]) on 05/09/2024 11:05:18 AM  Radiology NM PET CT CARDIAC PERFUSION MULTI W/ABSOLUTE BLOODFLOW Result Date: 05/09/2024   Findings are consistent with ischemia. The study is intermediate risk: Mild anterior ischemia with decrease in LAD stress flows.  LVEF is decreased but augments with stress and may be related to septal motion. Decrease iny myocardial blood flow reserve is partially underestimated due to high resting flows   LV perfusion is abnormal. There is evidence of ischemia. There is no evidence of infarction. Defect 1: There is a small defect with mild reduction in uptake present in the apical to mid anterior location(s) that is reversible. There is normal wall motion in the defect area. Consistent with ischemia.   Rest left ventricular function is abnormal. Rest global function is moderately reduced. Rest EF: 35%. Stress left ventricular function is abnormal. Stress global function is mildly reduced. Stress EF: 42%. End diastolic cavity size is mildly  enlarged. End systolic cavity size is mildly enlarged.   Myocardial blood flow was computed to be 1.79ml/g/min at rest and 1.90ml/g/min at stress. Global myocardial blood flow reserve was 1.58 and was abnormal.   Coronary calcium  assessment not performed due to prior revascularization. Aortic atherosclerosis.  Aortic valve calcification.   Electronically Signed  By: Gloriann Larger M.D. EXAM: OVER-READ INTERPRETATION  CT CHEST The following report is a limited chest CT over-read performed by radiologist Dr. Marcos Sevin [facility] on 05/09/2024. This over-read  does not include interpretation of cardiac or coronary anatomy or pathology nor does it include evaluation of the PET data. The cardiac PET-CT interpretation by the cardiologist is attached. COMPARISON:  Cardiac CT 02/10/2023 FINDINGS: Mediastinum/Nodes: No enlarged lymph nodes within the visualized mediastinum. Lungs/Pleura: There is no pleural effusion. Stable linear scarring in both lungs. Upper abdomen: No significant findings in the visualized upper abdomen. Musculoskeletal/Chest wall: No chest wall mass or suspicious osseous findings within the visualized chest. Thoracic spinal stimulator, previous right shoulder reverse arthroplasty and lower cervical fusion noted. IMPRESSION: No significant extracardiac findings within the visualized chest. Electronically Signed   By: Elmon Hagedorn M.D.   On: 05/09/2024 10:19  DG Chest Port 1 View Result Date: 05/09/2024 CLINICAL DATA:  tachycardia. EXAM: PORTABLE CHEST 1 VIEW COMPARISON:  06/30/2020. FINDINGS: Bilateral lung fields are clear. Bilateral costophrenic angles are clear. Stable cardio-mediastinal silhouette. No acute osseous abnormalities. Right reverse shoulder arthroplasty noted. Partially seen lower cervical spinal fixation hardware. The soft tissues are within normal limits. Neurostimulator device leads noted overlying the midthoracic spine region. IMPRESSION: No active  disease. Electronically Signed   By: Beula Brunswick M.D.   On: 05/09/2024 09:58    Procedures .Critical Care  Performed by: Auston Blush, MD Authorized by: Auston Blush, MD   Critical care provider statement:    Critical care time (minutes):  60   Critical care end time:  05/09/2024 11:39 AM   Critical care time was exclusive of:  Separately billable procedures and treating other patients and teaching time   Critical care was necessary to treat or prevent imminent or life-threatening deterioration of the following conditions:  Circulatory failure   Critical care was time spent personally by me on the following activities:  Development of treatment plan with patient or surrogate, discussions with consultants, evaluation of patient's response to treatment, examination of patient, ordering and review of laboratory studies, ordering and review of radiographic studies, ordering and performing treatments and interventions, pulse oximetry, re-evaluation of patient's condition and review of old charts .Sedation  Date/Time: 05/09/2024 3:11 PM  Performed by: Auston Blush, MD Authorized by: Auston Blush, MD   Consent:    Consent obtained:  Verbal and written   Consent given by:  Patient   Risks discussed:  Allergic reaction, dysrhythmia, inadequate sedation, nausea, prolonged hypoxia resulting in organ damage, prolonged sedation necessitating reversal, respiratory compromise necessitating ventilatory assistance and intubation and vomiting   Alternatives discussed:  Analgesia without sedation, anxiolysis and regional anesthesia Universal protocol:    Procedure explained and questions answered to patient or proxy's satisfaction: yes     Relevant documents present and verified: yes     Test results available: yes     Imaging studies available: yes     Required blood products, implants, devices, and special equipment available: yes     Site/side marked: yes     Immediately prior to procedure, a  time out was called: yes     Patient identity confirmed:  Verbally with patient Indications:    Procedure performed:  Cardioversion   Procedure necessitating sedation performed by:  Different physician Pre-sedation assessment:    Time since last food or drink:  22 hours   NPO status caution: unable to specify NPO status     ASA classification: class 3 - patient with severe systemic disease     Mouth opening:  3 or more finger widths   Thyromental distance:  3 finger widths   Mallampati score:  I - soft palate,  uvula, fauces, pillars visible   Neck mobility: normal     Pre-sedation assessments completed and reviewed: airway patency, cardiovascular function, hydration status, mental status, nausea/vomiting, pain level, respiratory function and temperature   A pre-sedation assessment was completed prior to the start of the procedure Immediate pre-procedure details:    Reassessment: Patient reassessed immediately prior to procedure     Reviewed: vital signs, relevant labs/tests and NPO status     Verified: bag valve mask available, emergency equipment available, intubation equipment available, IV patency confirmed, oxygen available and suction available   Procedure details (see MAR for exact dosages):    Preoxygenation:  Nasal cannula   Sedation:  Propofol    Intended level of sedation: deep   Intra-procedure monitoring:  Blood pressure monitoring, cardiac monitor, continuous pulse oximetry, frequent LOC assessments, frequent vital sign checks and continuous capnometry   Intra-procedure events: none     Total Provider sedation time (minutes):  30 Post-procedure details:   A post-sedation assessment was completed following the completion of the procedure.   Attendance: Constant attendance by certified staff until patient recovered     Recovery: Patient returned to pre-procedure baseline     Post-sedation assessments completed and reviewed: airway patency, cardiovascular function, hydration  status, mental status, nausea/vomiting, pain level, respiratory function and temperature     Patient is stable for discharge or admission: yes     Procedure completion:  Tolerated well, no immediate complications Comments:     Patient sedated with propofol  50 mg iv     Medications Ordered in ED Medications  diltiazem  (CARDIZEM ) 1 mg/mL load via infusion 10 mg (10 mg Intravenous Bolus from Bag 05/09/24 0947)    And  diltiazem  (CARDIZEM ) 125 mg in dextrose  5% 125 mL (1 mg/mL) infusion (15 mg/hr Intravenous Rate/Dose Change 05/09/24 1054)  levothyroxine  (SYNTHROID ) tablet 125 mcg (has no administration in time range)  rosuvastatin (CRESTOR) tablet 5 mg (has no administration in time range)  aspirin  EC tablet 81 mg (81 mg Oral Given 05/09/24 1453)  insulin  aspart (novoLOG ) injection 0-15 Units (2 Units Subcutaneous Given 05/09/24 1251)  insulin  aspart (novoLOG ) injection 0-5 Units (has no administration in time range)  acetaminophen  (TYLENOL ) tablet 650 mg (has no administration in time range)    Or  acetaminophen  (TYLENOL ) suppository 650 mg (has no administration in time range)  traZODone (DESYREL) tablet 25 mg (has no administration in time range)  ondansetron  (ZOFRAN ) tablet 4 mg (has no administration in time range)    Or  ondansetron  (ZOFRAN ) injection 4 mg (has no administration in time range)  metoprolol  tartrate (LOPRESSOR ) tablet 25 mg (25 mg Oral Given 05/09/24 1450)  heparin  ADULT infusion 100 units/mL (25000 units/250mL) (1,100 Units/hr Intravenous New Bag/Given 05/09/24 1502)  propofol  (DIPRIVAN ) 10 mg/mL bolus/IV push 42 mg (has no administration in time range)    ED Course/ Medical Decision Making/ A&P Clinical Course as of 05/09/24 1534  Tue May 09, 2024  1011 Patient received Cardizem  bolus and drip.  Heart rate is decreased to 130 and appears to be A-fib with aberrant conduction [DR]  1012 CBC reviewed interpreted within normal limits I-STAT reviewed interpreted with  slightly elevated BUN and creatinine and glucose glucose 163 creatinine 1.3 was within normal limits [DR]  1038 Chest x-Stasia Somero reviewed interpreted no evidence of acute abnormality and radiologist interpretation concurs [DR]    Clinical Course User Index [DR] Auston Blush, MD  Medical Decision Making Amount and/or Complexity of Data Reviewed Labs: ordered. Radiology: ordered.  Risk Prescription drug management. Decision regarding hospitalization.   78 -year-old female history of A-fib and left bundle branch presents today with rapid rate. Differential diagnosis includes but is not limited to A-fib RVR with aberrant conduction, A-fib, V. tach, etiologies including primary cardiac versus secondary etiologies such as infection, PE, anemia, other metabolic abnormalities Patient evaluated here with EKG that is most consistent with A-fib RVR with aberrant conduction Care discussed with cardiology, Dr. Emmette Harms, she will see in consultation Patient started here on Cardizem  with drip and rate has decreased from 180-130 and is more clearly A-fib with aberrant conduction on monitor now. CBC and basic metabolic panel checked and appear to be within normal limits Patient does not have any recent history of symptoms consistent with infection, PE, and is not having active chest pain.  Plan admission for ongoing evaluation and treatment Patient sent to ED with complex tachycardia with rapid rate Initial EKG with A-fib RVR with wide-complex.  This EKG was discussed with cardiology and they feel it is aberrant conduction Cardizem  is initiated.  Heart rate was down to 136.  Blood pressure has been stable.  Nursing has gone up on Cardizem  dose to 15 Labs are reviewed and appear to be within normal limits with exception of some hyponatremia that is noted on the bmet bed on i-STAT. Care was discussed with allergy who will see in consult Awaiting callback from hospitalist for  admission  Discussed with Dr. Lowell Rude who will see for admission Patient feels improved,  HR still bouncing in 130s.  Cardizem  being increased     3:34 PM Dr. Albert Huff requests sedation to cardiovert patient urgently here in ED. Discussed risks and benefits with patient. Plan propofol     Final Clinical Impression(s) / ED Diagnoses Final diagnoses:  Atrial fibrillation with RVR (HCC)  Atrial fibrillation with rapid ventricular response [facility])    Rx / DC Orders ED Discharge Orders     None         Auston Blush, MD 05/09/24 1139    Auston Blush, MD 05/09/24 1534

## 2024-05-09 NOTE — ED Notes (Signed)
 No cardizem  drip infusing at this time.

## 2024-05-09 NOTE — ED Notes (Signed)
 X-ray at bedside

## 2024-05-09 NOTE — ED Notes (Signed)
Patient provided with a pillow at this time.

## 2024-05-09 NOTE — ED Notes (Signed)
 Notified Dr.Ray of rate dose change of cardizem , to 15ml/hr at 1055a

## 2024-05-09 NOTE — Consult Note (Addendum)
 Cardiology Consultation   Patient ID: SAMSARA TEDESCO MRN: [id number]; DOB: Dec 30, 1945  Admit date: 05/09/2024 Date of Consult: 05/09/2024  PCP:  Rosslyn Coons, MD   [facility] Providers Cardiologist:  Richardo Chandler, MD  Electrophysiologist:  Richardo Chandler, MD       Patient Profile:   ALVIS ANTONINI is a 78 y.o. female with a hx of PAF on [facility] since 2015, CAD with recent PCI/stent to LCX 2024, LBBB, DM2, HTN, and HLD who is being seen 05/09/2024 for the evaluation of Afib RVR at the request of Dr. Jannette Mend.  History of Present Illness:   Ms. Meske was diagnosed with Afib in 2015 but has not required cardioversion. She has converted with IV cardizem  in the past. She has been anticoagulated for stroke PPX. She has DM2, HTN, LBBB.  Due to new exertional chest pain in 2024, she was referred for coronary CTA that demonstrated hemodynamically significant stenosis in the LAD and LCx by FFR.  Heart catheterization was arranged in March but her hemoglobin was low and her creatinine was elevated from baseline.  Cardiac catheterization was postponed and she was evaluated by GI with colonoscopy.  Hemoglobin improved and she proceeded to heart catheterization 04/23/23 that demonstrated 50% ostial to proximal LAD lesion, 80% proximal to mid LCx lesion subsequently treated with PCI/DES with 2.75 x 28 mm stent.  She was discharged on triple therapy x 30 days then Plavix  and Eliquis .  She did well post catheterization.  However she was seen in the clinic with EP 05/03/2024 and reported exertional chest pain similar to prior angina leading to heart catheterization.  She was referred to PET stress test which was performed today 05/09/2024.  She reported dizziness after nuclear stress test today. Telemetry re-applied and found in Afib RVR.  She was transferred down to [facility], ED for further evaluation.  On telemetry, she is in A-fib RVR with known left bundle branch block.  She is running  Cardizem  drip at 15 mg/h with improvement to rates and becoming hypotensive. When reviewing her symptoms, she described exertional chest pain with walking and house chores relieved by rest.  She has been taking her eliquis  without interruption.  Did not take the morning dose due to the stress test.  She has been on heparin  drip.  On review of her stress test, there is abnormal perfusion suggestive of ischemia.  She is currently chest pain-free.  I will continue holding Eliquis  for heparin  drip today.   Past Medical History:  Diagnosis Date   Arthritis    Atrial fibrillation with RVR (HCC) 09/21/2014   Bronchitis    h/o   Diabetes mellitus    NO MEDS,  DIET CONTROLLED   History of migraines    "when working under alot of stress"   Hypertension    Hypothyroid    LBBB (left bundle branch block) 09/22/2014   OSA (obstructive sleep apnea) 04/09/2015   does not use cpap   PONV (postoperative nausea and vomiting)    PT DOESNOT WANT TO TAKE HER ATENOLOL      DOS.   " IT DROPS MY BP TO LOW"    Past Surgical History:  Procedure Laterality Date   ABDOMINAL HYSTERECTOMY  03/2001   APPENDECTOMY     BACK SURGERY     CATARACT EXTRACTION, BILATERAL     WITH IMPLANTS   CERVICAL FUSION  08/2003   CHOLECYSTECTOMY  03/2010   COLONOSCOPY WITH PROPOFOL  N/A 08/27/2023   Procedure: COLONOSCOPY WITH  PROPOFOL ;  Surgeon: Alvis Jourdain, MD;  Location: [facility];  Service: Gastroenterology;  Laterality: N/A;   CORONARY STENT INTERVENTION N/A 04/23/2023   Procedure: CORONARY STENT INTERVENTION;  Surgeon: Arnoldo Lapping, MD;  Location: [facility] INVASIVE CV LAB;  Service: Cardiovascular;  Laterality: N/A;   ESOPHAGOGASTRODUODENOSCOPY N/A 08/27/2023   Procedure: ESOPHAGOGASTRODUODENOSCOPY (EGD);  Surgeon: Alvis Jourdain, MD;  Location: [facility];  Service: Gastroenterology;  Laterality: N/A;   EYE SURGERY     GIVENS CAPSULE STUDY N/A 11/18/2023   Procedure: GIVENS CAPSULE STUDY;  Surgeon: Alvis Jourdain, MD;   Location: [facility] ENDOSCOPY;  Service: Gastroenterology;  Laterality: N/A;   LEFT HEART CATH AND CORONARY ANGIOGRAPHY N/A 04/23/2023   Procedure: LEFT HEART CATH AND CORONARY ANGIOGRAPHY;  Surgeon: Arnoldo Lapping, MD;  Location: [facility] INVASIVE CV LAB;  Service: Cardiovascular;  Laterality: N/A;   LUMBAR DISC SURGERY     LUMBAR LAMINECTOMY/DECOMPRESSION MICRODISCECTOMY  10/2010   POSTERIOR FUSION LUMBAR SPINE  12/16/11; 12/2005; 07/2004   L2-3; L3-4; L4-5   REVERSE SHOULDER ARTHROPLASTY Right 02/26/2022   Procedure: REVERSE SHOULDER ARTHROPLASTY;  Surgeon: Sammye Cristal, MD;  Location: [facility] ORS;  Service: Orthopedics;  Laterality: Right;   TOTAL KNEE ARTHROPLASTY Right 03/18/2017   Procedure: RIGHT TOTAL KNEE ARTHROPLASTY;  Surgeon: Genevie Kerns, MD;  Location: [facility] ORS;  Service: Orthopedics;  Laterality: Right;     Home Medications:  Prior to Admission medications   Medication Sig Start Date End Date Taking? Authorizing Provider  ACCU-CHEK FASTCLIX LANCETS MISC 1 each by Other route 2 (two) times daily. Use 1 lancet bid 10/21/15   [provider]  ACCU-CHEK SMARTVIEW test strip 1 strip by Other route 2 (two) times daily. Use 1 strip to check glucose twice a day 10/21/15   [provider]  albuterol  (VENTOLIN  HFA) 108 (90 Base) MCG/ACT inhaler Inhale 2 puffs into the lungs every 6 (six) hours as needed for wheezing or shortness of breath (Cough). 11/21/22   Eloise Hake Scales, PA-C  amLODipine  (NORVASC ) 5 MG tablet Take 5 mg by mouth in the morning. 12/14/16   [provider]  apixaban  (ELIQUIS ) 5 MG TABS tablet Take 1 tablet (5 mg total) by mouth 2 (two) times daily. 05/03/23   Verona Goodwill, MD  aspirin  EC 81 MG tablet Take 81 mg by mouth daily. 09/29/23   [provider]  Cholecalciferol  (VITAMIN D3) 1000 UNITS CAPS Take 1,000 Units by mouth in the morning.    [provider]  cyanocobalamin (FINEST NUTRITION VITAMIN B-12) 500 MCG tablet  01/03/24    [provider]  furosemide  (LASIX ) 20 MG tablet Take 1 tablet (20 mg total) by mouth daily. 05/03/24 08/01/24  Debbie Fails, PA-C  hydrochlorothiazide (HYDRODIURIL) 25 MG tablet Take 25 mg by mouth in the morning.    [provider]  latanoprost  (XALATAN ) 0.005 % ophthalmic solution Place 1 drop into both eyes at bedtime. 04/11/23   [provider]  levothyroxine  (SYNTHROID , LEVOTHROID) 125 MCG tablet Take 125 mcg by mouth daily before breakfast.    [provider]  losartan  (COZAAR ) 100 MG tablet Take 1 tablet (100 mg total) by mouth daily. 05/03/23   Verona Goodwill, MD  metFORMIN  (GLUCOPHAGE ) 1000 MG tablet Take 1,000 mg by mouth 2 (two) times daily.    [provider]  rosuvastatin (CRESTOR) 5 MG tablet Take 5 mg by mouth 2 (two) times a week. Tuesdays & Thursdays.    [provider]    Inpatient Medications: Scheduled Meds:  aspirin  EC  81 mg Oral Daily   insulin  aspart  0-15 Units Subcutaneous TID WC   insulin  aspart  0-5 Units Subcutaneous QHS   [START ON 05/10/2024] levothyroxine   125 mcg Oral QAC breakfast   metoprolol  tartrate  25 mg Oral BID   [START ON 05/11/2024] rosuvastatin  5 mg Oral Once per day on Monday Thursday   Continuous Infusions:  diltiazem  (CARDIZEM ) infusion 15 mg/hr (05/09/24 1054)   heparin  1,100 Units/hr (05/09/24 1502)   PRN Meds: acetaminophen  **OR** acetaminophen , ondansetron  **OR** ondansetron  (ZOFRAN ) IV, propofol , traZODone  Allergies:    Allergies  Allergen Reactions   Oxycodone Other (See Comments)    Severe headaches, hallucinations    Codeine Other (See Comments)    Severe Headaches with codeine; but not with other opioids    Social History:   Social History   Socioeconomic History   Marital status: Married    Spouse name: Not on file   Number of children: 2   Years of education: 13   Highest education level: Not on file  Occupational History   Occupation: part time-[employer]    Occupation: retired  Tobacco Use   Smoking status: Never   Smokeless tobacco: Never  Vaping Use   Vaping status: Never Used  Substance and Sexual Activity   Alcohol use: No   Drug use: No   Sexual activity: Never  Other Topics Concern   Not on file  Social History Narrative   Patient drinks about 2-3 cups of caffeine daily.   Patient is right handed.    Social Drivers of Corporate investment banker Strain: Not on file  Food Insecurity: Not on file  Transportation Needs: Not on file  Physical Activity: Not on file  Stress: Not on file  Social Connections: Not on file  Intimate Partner Violence: Not on file    Family History:    Family History  Adopted: Yes  Family history unknown: Yes     ROS:  Please see the history of present illness.   All other ROS reviewed and negative.     Physical Exam/Data:   Vitals:   05/09/24 1525 05/09/24 1533 05/09/24 1539 05/09/24 1547  BP:   (!) 150/62 (!) 150/62  Pulse: (!) 133  61 (!) 57  Resp: (!) 24  18 (!) 22  Temp:      TempSrc:      SpO2:  100% 97% 99%  Weight:      Height:       No intake or output data in the 24 hours ending 05/09/24 1555    05/09/2024    9:28 AM 05/03/2024    8:12 AM 11/18/2023    8:00 AM  Last 3 Weights  Weight (lbs) 185 lb 189 lb 177 lb  Weight (kg) 83.915 kg 85.73 kg 80.287 kg     Body mass index is 30.79 kg/m.  General:  Well nourished, well developed, in no acute distress HEENT: normal Neck: no JVD Vascular: No carotid bruits; Distal pulses 2+ bilaterally Cardiac:  irregular rhythm, tachycardic rate Lungs:  clear to auscultation bilaterally, no wheezing, rhonchi or rales  Abd: soft, nontender, no hepatomegaly  Ext: no edema Musculoskeletal:  No deformities, BUE and BLE strength normal and equal Skin: warm and dry  Neuro:  CNs 2-12 intact, no focal abnormalities noted Psych:  Normal affect   EKG:  The EKG was personally reviewed and demonstrates:  atrial fibrillation with VR 179  with LBBB Telemetry:  Telemetry  was personally reviewed and demonstrates:  Afib with VR 130s with LBBB  Relevant CV Studies:  PET stress test 05/09/24:    Findings are consistent with ischemia. The study is intermediate risk: Mild anterior ischemia with decrease in LAD stress flows.  LVEF is decreased but augments with stress and may be related to septal motion. Decrease iny myocardial blood flow reserve is partially underestimated due to high resting flows   LV perfusion is abnormal. There is evidence of ischemia. There is no evidence of infarction. Defect 1: There is a small defect with mild reduction in uptake present in the apical to mid anterior location(s) that is reversible. There is normal wall motion in the defect area. Consistent with ischemia.   Rest left ventricular function is abnormal. Rest global function is moderately reduced. Rest EF: 35%. Stress left ventricular function is abnormal. Stress global function is mildly reduced. Stress EF: 42%. End diastolic cavity size is mildly enlarged. End systolic cavity size is mildly enlarged.   Myocardial blood flow was computed to be 1.19ml/g/min at rest and 1.90ml/g/min at stress. Global myocardial blood flow reserve was 1.58 and was abnormal.   Coronary calcium  assessment not performed due to prior revascularization. Aortic atherosclerosis.  Aortic valve calcification.   Electronically Signed  By: Gloriann Larger M.D.   Cath 04/23/2023   Ost LAD to Prox LAD lesion is 50% stenosed.   Prox Cx to Mid Cx lesion is 80% stenosed.   1st RPL lesion is 60% stenosed.   A drug-eluting stent was successfully placed using a SYNERGY XD 2.75X28.   Post intervention, there is a 0% residual stenosis.   1.  Widely patent left main with no significant stenosis 2.  Patent LAD with 40 to 50% proximal stenosis, nonobstructive disease appropriate for medical therapy 3.  Severe mid circumflex stenosis treated successfully with a 2.75 x 28 mm Synergy  DES 4.  Large, dominant RCA with mild nonobstructive plaquing and moderate focal stenosis in a large posterolateral branch 5.  Normal LVEDP   Recommendations: Resume apixaban  tomorrow.  Clopidogrel  600 mg administered orally on the table during the procedure.  Take clopidogrel  75 mg daily starting tomorrow for at least 3 months, then consider transition to aspirin  81 mg daily in the setting of chronic oral anticoagulation.    Same day DC if criteria met  Laboratory Data:  High Sensitivity Troponin:  No results for input(s): "TROPONINIHS" in the last 720 hours.   Chemistry Recent Labs  Lab 05/03/24 0914 05/09/24 0932 05/09/24 0939  NA 138 133* 137  K 4.7 4.1 4.2  CL 101 100 104  CO2 20 23  --   GLUCOSE 132* 166* 163*  BUN 22 30* 28*  CREATININE 1.18* 1.16* 1.30*  CALCIUM  9.7 9.8  --   GFRNONAA  --  48*  --   ANIONGAP  --  10  --     Recent Labs  Lab 05/03/24 0914  PROT 7.0  ALBUMIN  4.4  AST 16  ALT 14  ALKPHOS 104  BILITOT 0.4   Lipids  Recent Labs  Lab 05/03/24 0914  CHOL 157  TRIG 177*  HDL 66  LABVLDL 29  LDLCALC 62  CHOLHDL 2.4    Hematology Recent Labs  Lab 05/03/24 0914 05/09/24 0932 05/09/24 0939  WBC 5.8 6.5  --   RBC 4.57 4.98  --   HGB 12.5 13.8 14.6  HCT 39.6 42.7 43.0  MCV 87 85.7  --   MCH 27.4 27.7  --  MCHC 31.6 32.3  --   RDW 12.9 13.0  --   PLT 267 250  --    Thyroid  No results for input(s): "TSH", "FREET4" in the last 168 hours.  BNPNo results for input(s): "BNP", "PROBNP" in the last 168 hours.  DDimer No results for input(s): "DDIMER" in the last 168 hours.   Radiology/Studies:  NM PET CT CARDIAC PERFUSION MULTI W/ABSOLUTE BLOODFLOW Result Date: 05/09/2024   Findings are consistent with ischemia. The study is intermediate risk: Mild anterior ischemia with decrease in LAD stress flows.  LVEF is decreased but augments with stress and may be related to septal motion. Decrease iny myocardial blood flow reserve is partially  underestimated due to high resting flows   LV perfusion is abnormal. There is evidence of ischemia. There is no evidence of infarction. Defect 1: There is a small defect with mild reduction in uptake present in the apical to mid anterior location(s) that is reversible. There is normal wall motion in the defect area. Consistent with ischemia.   Rest left ventricular function is abnormal. Rest global function is moderately reduced. Rest EF: 35%. Stress left ventricular function is abnormal. Stress global function is mildly reduced. Stress EF: 42%. End diastolic cavity size is mildly enlarged. End systolic cavity size is mildly enlarged.   Myocardial blood flow was computed to be 1.32ml/g/min at rest and 1.90ml/g/min at stress. Global myocardial blood flow reserve was 1.58 and was abnormal.   Coronary calcium  assessment not performed due to prior revascularization. Aortic atherosclerosis.  Aortic valve calcification.   Electronically Signed  By: Gloriann Larger M.D. EXAM: OVER-READ INTERPRETATION  CT CHEST The following report is a limited chest CT over-read performed by radiologist Dr. Marcos Sevin [facility] on 05/09/2024. This over-read does not include interpretation of cardiac or coronary anatomy or pathology nor does it include evaluation of the PET data. The cardiac PET-CT interpretation by the cardiologist is attached. COMPARISON:  Cardiac CT 02/10/2023 FINDINGS: Mediastinum/Nodes: No enlarged lymph nodes within the visualized mediastinum. Lungs/Pleura: There is no pleural effusion. Stable linear scarring in both lungs. Upper abdomen: No significant findings in the visualized upper abdomen. Musculoskeletal/Chest wall: No chest wall mass or suspicious osseous findings within the visualized chest. Thoracic spinal stimulator, previous right shoulder reverse arthroplasty and lower cervical fusion noted. IMPRESSION: No significant extracardiac findings within the visualized chest.  Electronically Signed   By: Elmon Hagedorn M.D.   On: 05/09/2024 10:19  DG Chest Port 1 View Result Date: 05/09/2024 CLINICAL DATA:  tachycardia. EXAM: PORTABLE CHEST 1 VIEW COMPARISON:  06/30/2020. FINDINGS: Bilateral lung fields are clear. Bilateral costophrenic angles are clear. Stable cardio-mediastinal silhouette. No acute osseous abnormalities. Right reverse shoulder arthroplasty noted. Partially seen lower cervical spinal fixation hardware. The soft tissues are within normal limits. Neurostimulator device leads noted overlying the midthoracic spine region. IMPRESSION: No active disease. Electronically Signed   By: Beula Brunswick M.D.   On: 05/09/2024 09:58     Assessment and Plan:   Afib with RVR PAF - Currently on cardizem  gtt - will continue with rate control strategy for now given that she missed a dose of eliquis  this morning for PET stress test - will start lopressor  25 mg TID - if she doesn't convert, can consider TEE-DCCV once able to take [facility]   Exertional chest pain - hx of PCI in LCX - symptoms concerning for stable angina - she proceeded to planned OP PET stress test today which showed perfusion defect consistent with ischemia -  will hold eliquis  for now and start heparin  gtt for CAD and for stroke PPX - will likely need to proceed to repeat heart catheterization   CAD - PCI/DES-LCX 04/2023 - remains on plavix  and eliquis  - residual 50% stenosis in the proximal to mid LAD and 60% in a PDA branch   Chronic anticoagulation - as above, missed one dose of eliquis  this morning - will hold eliquis  and start heparin  drip while we determine next steps for ischemic evaluation   Hypertension - hold home medications for rate controlling agents   Hyperlipidemia with LDL goal < 70 05/03/2024: Cholesterol, Total 157; HDL 66; LDL Chol Calc (NIH) 62; Triglycerides 177 Doesn't tolerate higher doses of statin medications  Risk Assessment/Risk Scores:      CHA2DS2-VASc  Score = 6   This indicates a 9.7% annual risk of stroke. The patient's score is based upon: CHF History: 0 HTN History: 1 Diabetes History: 1 Stroke History: 0 Vascular Disease History: 1 Age Score: 2 Gender Score: 1     For questions or updates, please contact [facility] Please consult [email or web address] for contact info under   Signed, Lamond Pilot, [place]  05/09/2024 1:37 PM  ADDENDUM:   Patient seen and examined with Lamond Pilot, PA .  I personally taken a history, examined the patient, reviewed relevant notes,  laboratory data / imaging studies.  I performed a substantive portion of this encounter and formulated the important aspects of the plan.  I agree with the APP's note, impression, and recommendations; however, I have edited the note to reflect changes or salient points.   Patient's been experiencing anginal chest pain and was scheduled for a routine cardiac PET/CT earlier today.  Postprocedure she was found to be in A-fib with RVR and presented to the hospital for further evaluation and management.  She is currently on Cardizem  drip and ventricular rates are slowly improving.  Clinically asymptomatic.  Patient has been on anticoagulation without interruptions according to her with the exception of earlier this morning did not take it prior to her cardiac PET/CT.   For the last several weeks patient states that she has been having precordial pain, brought on by effort related activities, resolving with rest.  Patient is accompanied by her husband at bedside  PHYSICAL EXAM: Today's Vitals   05/09/24 1525 05/09/24 1533 05/09/24 1539 05/09/24 1547  BP:   (!) 150/62 (!) 150/62  Pulse: (!) 133  61 (!) 57  Resp: (!) 24  18 (!) 22  Temp:      TempSrc:      SpO2:  100% 97% 99%  Weight:      Height:      PainSc:       Body mass index is 30.79 kg/m.   Net IO Since Admission: No IO data has been entered for this period [05/09/24 1555]  Filed Weights    05/09/24 0928  Weight: 83.9 kg    Physical Exam  Constitutional: No distress. She appears acutely ill.  hemodynamically stable  Neck: No JVD present.  Cardiovascular: S1 normal and S2 normal. An irregularly irregular rhythm present. Tachycardia present. Exam reveals no gallop, no S3 and no S4.  No murmur heard. Pulmonary/Chest: Effort normal and breath sounds normal. No stridor. She has no wheezes. She has no rales.  Musculoskeletal:        General: No edema.     Cervical back: Neck supple.  Skin: Skin is warm.    EKG: (personally reviewed  by me) 05/09/2024:  929 Wide-complex tachycardia with extreme rightward axis, irregular, bundle branch block, some criteria's suggesting ventricular tachycardia.  1027: Wide-complex tachycardia, irregularly irregular, rightward axis, underlying atrial fibrillation with possible aberrancy but ventricular tachycardia cannot be ruled out.  Telemetry: (personally reviewed by me) Wide-complex tachycardia   Impression:  Wide-complex tachycardia. Atrial fibrillation with rapid ventricular rate. Coronary artery disease with anginal chest pain. Abnormal cardiac PET/CT Chronic anticoagulation Hypertension  Recommendations:  Patient was scheduled for cardiac PET/CT earlier this morning and postprocedure started experiencing tachycardia and lightheadedness.  EKG concerning for A-fib with RVR and patient was brought to the ER for further evaluation and management.  Initial EKG was interpreted to be A-fib with RVR patient was started on Cardizem  and was maxed out.  Ventricular rate did not improve much but patient started experiencing hypotension.  Patient's baseline rhythm is sinus with left bundle branch block.  Her EKG today and presentation were concerning for possible ventricular tachycardia superimposed on A-fib with RVR as she was becoming hemodynamically compromised with soft blood pressures.  I called on-call EP provider Dr. Arlester Ladd to review the  EKG and he also felt that the underlying rhythm suggestive of  ventricular tachycardia and given her clinical trajectory would benefit from cardioversion.  Discussed the risks, benefits, alternatives of direct-current cardioversion with the patient and husband at bedside.  Patient has been appropriately anticoagulated with the exception of this morning dose of Eliquis  was postponed until the stress test was complete.  She has been on IV heparin  drip since she has been in the emergency room department.  It is felt that the benefit of the procedure is greater than the risk as part of shared decision making patient agrees to proceed forward with cardioversion as she is becoming symptomatic despite medical therapy.  Plan of care discussed with the husband and patient.  Informed consent is obtained.  Synchronized direct-current cardioversion 200 J x 1 converted the patient to sinus rhythm.   Anesthesia was provided by ED physician Dr. Synetta Eves.   Continue IV heparin  drip.  Spoke to attending physician prior to the direct-current cardioversion and also postprocedure.  Recommend transfer to [facility] for closer observation on progressive bed.  Will likely schedule left heart catheterization with possible intervention 05/11/2024.  Telemetry bed.  Further recommendations to follow as the case evolves.  CRITICAL CARE Performed by: Olinda Bertrand   Total critical care time: 70 minutes   Critical care time was exclusive of separately billable procedures and treating other patients.   Critical care was necessary to treat or prevent imminent or life-threatening deterioration due to wide-complex tachycardia concerning for ventricular tachycardia/A-fib with RVR with aberrancy.   Critical care was time spent personally by me on the following activities: development of treatment plan with patient and/or surrogate as well as nursing, discussions with consultants, evaluation of patient's response to treatment, examination  of patient, obtaining history from patient or surrogate, ordering and performing treatments and interventions, ordering and review of laboratory studies, ordering and review of radiographic studies, pulse oximetry and re-evaluation of patient's condition.   This note was created using a voice recognition software as a result there may be grammatical errors inadvertently enclosed that do not reflect the nature of this encounter. Every attempt is made to correct such errors.   Dereon Williamsen Fredrich Jefferson, [facility] [place]  [facility]  Pager: [phone] Office: [phone] 05/09/2024 3:55 PM

## 2024-05-09 NOTE — Progress Notes (Signed)
 Respiratory Therapist at [facility]  in room number RESA during procedure.  Suction with [place] at [facility] set up and ready to use. Ambu bag at [facility] and ready to use.  Patient placed on ETCO2 Nasal Cannula at 3 LPM.  Vitals at conclusion of procedure:  ETCO2 30 mmHg HR 58 RR 15 SPO2 99  Patient awake and able to verbalize name.

## 2024-05-09 NOTE — ED Triage Notes (Signed)
 Patient sent from nuclear medicine. Received regadenosine and then 15 minutes after she began having a heart rate in the 180s. Denies chest pain. Feels short of breath. Feels dizzy.

## 2024-05-09 NOTE — H&P (Signed)
 History and Physical  Destiny Brown ZOX:[id number] DOB: February 28, 1946 DOA: 05/09/2024  PCP: Rosslyn Coons, MD   Chief Complaint: Dizziness, weakness  HPI: Destiny Brown is a 78 y.o. female with medical history significant for CAD and circumflex stent April 2024, paroxysmal atrial fibrillation on Eliquis , diet-controlled diabetes, hypothyroidism on Synthroid  being admitted to the hospital with atrial fibrillation with rapid ventricular response.  She is followed by cardiology as an outpatient, she was scheduled for nuclear medicine stress test as an outpatient today.  About 15 minutes after regadenosine administration, patient felt weak and dizzy.  She was found to have heart rate in the 180s felt to be rapid atrial fibrillation.  She was brought to the ER for evaluation, where she was placed on IV Cardizem  drip blood pressure is stable and her heart rate is currently in the 130s.  Currently patient is resting comfortably and is completely asymptomatic.  Review of Systems: Please see HPI for pertinent positives and negatives. A complete 10 system review of systems are otherwise negative.  Past Medical History:  Diagnosis Date   Arthritis    Atrial fibrillation with RVR (HCC) 09/21/2014   Bronchitis    h/o   Diabetes mellitus    NO MEDS,  DIET CONTROLLED   History of migraines    "when working under alot of stress"   Hypertension    Hypothyroid    LBBB (left bundle branch block) 09/22/2014   OSA (obstructive sleep apnea) 04/09/2015   does not use cpap   PONV (postoperative nausea and vomiting)    PT DOESNOT WANT TO TAKE HER ATENOLOL      DOS.   " IT DROPS MY BP TO LOW"   Past Surgical History:  Procedure Laterality Date   ABDOMINAL HYSTERECTOMY  03/2001   APPENDECTOMY     BACK SURGERY     CATARACT EXTRACTION, BILATERAL     WITH IMPLANTS   CERVICAL FUSION  08/2003   CHOLECYSTECTOMY  03/2010   COLONOSCOPY WITH PROPOFOL  N/A 08/27/2023   Procedure: COLONOSCOPY WITH PROPOFOL ;   Surgeon: Alvis Jourdain, MD;  Location: [facility] ENDOSCOPY;  Service: Gastroenterology;  Laterality: N/A;   CORONARY STENT INTERVENTION N/A 04/23/2023   Procedure: CORONARY STENT INTERVENTION;  Surgeon: Arnoldo Lapping, MD;  [facility] INVASIVE CV LAB;  Service: Cardiovascular;  Laterality: N/A;   ESOPHAGOGASTRODUODENOSCOPY N/A 08/27/2023   Procedure: ESOPHAGOGASTRODUODENOSCOPY (EGD);  Surgeon: Alvis Jourdain, MD;  Location: [facility]OSCOPY;  Service: Gastroenterology;  Laterality: N/A;   EYE SURGERY     GIVENS CAPSULE STUDY N/A 11/18/2023   Procedure: GIVENS CAPSULE STUDY;  Surgeon: Alvis Jourdain, MD;  Location: [facility] ENDOSCOPY;  Service: Gastroenterology;  Laterality: N/A;   LEFT HEART CATH AND CORONARY ANGIOGRAPHY N/A 04/23/2023   Procedure: LEFT HEART CATH AND CORONARY ANGIOGRAPHY;  Surgeon: Arnoldo Lapping, MD;  Location: [facility] INVASIVE CV LAB;  Service: Cardiovascular;  Laterality: N/A;   LUMBAR DISC SURGERY     LUMBAR LAMINECTOMY/DECOMPRESSION MICRODISCECTOMY  10/2010   POSTERIOR FUSION LUMBAR SPINE  12/16/11; 12/2005; 07/2004   L2-3; L3-4; L4-5   REVERSE SHOULDER ARTHROPLASTY Right 02/26/2022   Procedure: REVERSE SHOULDER ARTHROPLASTY;  Surgeon: Sammye Cristal, MD;  Location: [facility] ORS;  Service: Orthopedics;  Laterality: Right;   TOTAL KNEE ARTHROPLASTY Right 03/18/2017   Procedure: RIGHT TOTAL KNEE ARTHROPLASTY;  Surgeon: Genevie Kerns, MD;  Location: [facility] ORS;  Service: Orthopedics;  Laterality: Right;   Social History:  reports that she has never smoked. She has never used smokeless tobacco. She reports that she does  not drink alcohol and does not use drugs.  Allergies  Allergen Reactions   Oxycodone Other (See Comments)    Severe headaches, hallucinations    Codeine Other (See Comments)    Severe Headaches with codeine; but not with other opioids   Percocet [Oxycodone-Acetaminophen ] Other (See Comments)    hallucinations    Family History  Adopted: Yes  Family history unknown: Yes     Prior  to Admission medications   Medication Sig Start Date End Date Taking? Authorizing Provider  ACCU-CHEK FASTCLIX LANCETS MISC 1 each by Other route 2 (two) times daily. Use 1 lancet bid 10/21/15   [provider]  ACCU-CHEK SMARTVIEW test strip 1 strip by Other route 2 (two) times daily. Use 1 strip to check glucose twice a day 10/21/15   [provider]  albuterol  (VENTOLIN  HFA) 108 (90 Base) MCG/ACT inhaler Inhale 2 puffs into the lungs every 6 (six) hours as needed for wheezing or shortness of breath (Cough). 11/21/22   Eloise Hake Scales, PA-C  amLODipine  (NORVASC ) 5 MG tablet Take 5 mg by mouth in the morning. 12/14/16   [provider]  apixaban  (ELIQUIS ) 5 MG TABS tablet Take 1 tablet (5 mg total) by mouth 2 (two) times daily. 05/03/23   Verona Goodwill, MD  aspirin  EC 81 MG tablet Take 81 mg by mouth daily. 09/29/23   [provider]  Cholecalciferol  (VITAMIN D3) 1000 UNITS CAPS Take 1,000 Units by mouth in the morning.    [provider]  cyanocobalamin (FINEST NUTRITION VITAMIN B-12) 500 MCG tablet  01/03/24   [provider]  furosemide  (LASIX ) 20 MG tablet Take 1 tablet (20 mg total) by mouth daily. 05/03/24 08/01/24  Debbie Fails, PA-C  hydrochlorothiazide (HYDRODIURIL) 25 MG tablet Take 25 mg by mouth in the morning.    [provider]  latanoprost  (XALATAN ) 0.005 % ophthalmic solution Place 1 drop into both eyes at bedtime. 04/11/23   [provider]  levothyroxine  (SYNTHROID , LEVOTHROID) 125 MCG tablet Take 125 mcg by mouth daily before breakfast.    [provider]  losartan  (COZAAR ) 100 MG tablet Take 1 tablet (100 mg total) by mouth daily. 05/03/23   Verona Goodwill, MD  metFORMIN  (GLUCOPHAGE ) 1000 MG tablet Take 1,000 mg by mouth 2 (two) times daily.    [provider]  rosuvastatin (CRESTOR) 5 MG tablet Take 5 mg by mouth 2 (two) times a week. Tuesdays & Thursdays.    [provider]     Physical Exam: BP 121/74   Pulse (!) 140   Temp 97.9 F (36.6 C) (Oral)   Resp (!) 22   Ht 5\' 5"  (1.651 m)   Wt 83.9 kg   SpO2 98%   BMI 30.79 kg/m  General:  Alert, oriented, calm, in no acute distress, her husband is at the bedside Cardiovascular: Tachycardic, no murmurs or rubs, no peripheral edema  Respiratory: clear to auscultation bilaterally, no wheezes, no crackles  Abdomen: soft, nontender, nondistended, normal bowel tones heard  Skin: dry, no rashes  Musculoskeletal: no joint effusions, normal range of motion  Psychiatric: appropriate affect, normal speech  Neurologic: extraocular muscles intact, clear speech, moving all extremities with intact sensorium         Labs on Admission:  Basic Metabolic Panel: Recent Labs  Lab 05/03/24 0914 05/09/24 0932 05/09/24 0939  NA 138 133* 137  K 4.7 4.1 4.2  CL 101 100 104  CO2 20 23  --  GLUCOSE 132* 166* 163*  BUN 22 30* 28*  CREATININE 1.18* 1.16* 1.30*  CALCIUM  9.7 9.8  --    Liver Function Tests: Recent Labs  Lab 05/03/24 0914  AST 16  ALT 14  ALKPHOS 104  BILITOT 0.4  PROT 7.0  ALBUMIN  4.4   No results for input(s): "LIPASE", "AMYLASE" in the last 168 hours. No results for input(s): "AMMONIA" in the last 168 hours. CBC: Recent Labs  Lab 05/03/24 0914 05/09/24 0932 05/09/24 0939  WBC 5.8 6.5  --   HGB 12.5 13.8 14.6  HCT 39.6 42.7 43.0  MCV 87 85.7  --   PLT 267 250  --    Cardiac Enzymes: No results for input(s): "CKTOTAL", "CKMB", "CKMBINDEX", "TROPONINI" in the last 168 hours. BNP (last 3 results) No results for input(s): "BNP" in the last 8760 hours.  ProBNP (last 3 results) No results for input(s): "PROBNP" in the last 8760 hours.  CBG: No results for input(s): "GLUCAP" in the last 168 hours.  Radiological Exams on Admission: NM PET CT CARDIAC PERFUSION MULTI W/ABSOLUTE BLOODFLOW Result Date: 05/09/2024   Findings are consistent with ischemia. The study is intermediate risk: Mild  anterior ischemia with decrease in LAD stress flows.  LVEF is decreased but augments with stress and may be related to septal motion. Decrease iny myocardial blood flow reserve is partially underestimated due to high resting flows   LV perfusion is abnormal. There is evidence of ischemia. There is no evidence of infarction. Defect 1: There is a small defect with mild reduction in uptake present in the apical to mid anterior location(s) that is reversible. There is normal wall motion in the defect area. Consistent with ischemia.   Rest left ventricular function is abnormal. Rest global function is moderately reduced. Rest EF: 35%. Stress left ventricular function is abnormal. Stress global function is mildly reduced. Stress EF: 42%. End diastolic cavity size is mildly enlarged. End systolic cavity size is mildly enlarged.   Myocardial blood flow was computed to be 1.95ml/g/min at rest and 1.90ml/g/min at stress. Global myocardial blood flow reserve was 1.58 and was abnormal.   Coronary calcium  assessment not performed due to prior revascularization. Aortic atherosclerosis.  Aortic valve calcification.   Electronically Signed  By: Gloriann Larger M.D. EXAM: OVER-READ INTERPRETATION  CT CHEST The following report is a limited chest CT over-read performed by radiologist Dr. Marcos Sevin [facility] on 05/09/2024. This over-read does not include interpretation of cardiac or coronary anatomy or pathology nor does it include evaluation of the PET data. The cardiac PET-CT interpretation by the cardiologist is attached. COMPARISON:  Cardiac CT 02/10/2023 FINDINGS: Mediastinum/Nodes: No enlarged lymph nodes within the visualized mediastinum. Lungs/Pleura: There is no pleural effusion. Stable linear scarring in both lungs. Upper abdomen: No significant findings in the visualized upper abdomen. Musculoskeletal/Chest wall: No chest wall mass or suspicious osseous findings within the visualized chest.  Thoracic spinal stimulator, previous right shoulder reverse arthroplasty and lower cervical fusion noted. IMPRESSION: No significant extracardiac findings within the visualized chest. Electronically Signed   By: Elmon Hagedorn M.D.   On: 05/09/2024 10:19  DG Chest Port 1 View Result Date: 05/09/2024 CLINICAL DATA:  tachycardia. EXAM: PORTABLE CHEST 1 VIEW COMPARISON:  06/30/2020. FINDINGS: Bilateral lung fields are clear. Bilateral costophrenic angles are clear. Stable cardio-mediastinal silhouette. No acute osseous abnormalities. Right reverse shoulder arthroplasty noted. Partially seen lower cervical spinal fixation hardware. The soft tissues are within normal limits. Neurostimulator device leads noted overlying the midthoracic  spine region. IMPRESSION: No active disease. Electronically Signed   By: Beula Brunswick M.D.   On: 05/09/2024 09:58   Assessment/Plan Destiny Brown is a 78 y.o. female with medical history significant for CAD and circumflex stent April 2024, paroxysmal atrial fibrillation on Eliquis , diet-controlled diabetes, hypothyroidism on Synthroid  being admitted to the hospital with atrial fibrillation with rapid ventricular response.  Atrial fibrillation with RVR--patient is hemodynamically stable, without chest pain.  May have been precipitated by nuclear contrast administration, or she may have been experiencing paroxysmal runs of atrial fibrillation recently as she has been experiencing some dyspnea with exertion and weakness from time to time. -Observation admission -Continuous telemetry monitoring -IV Cardizem  drip -Will hold her home losartan  and HCTZ and start p.o. metoprolol  -ER provider discussed with cardiology, who will be seeing the patient in consultation as well -Continue Eliquis  -Check TSH level  CAD-continue home aspirin   Hypertension-will hold HCTZ and losartan  for now in the setting of Cardizem  drip and need to adjust cardiac medications for A-fib  Type 2  diabetes-hold metformin , carb modified diet, moderate dose sliding scale  Hypothyroidism-check TSH, continue Synthroid     Code Status: Full Code  Consults called: Cardiology  Admission status: Observation  Time spent: 55 minutes  Sumaiyah Markert Rickey Charm MD [facility] Pager [phone]  If 7PM-7AM, please contact night-coverage [email or web address] Password [email or web address]  05/09/2024, 11:40 AM

## 2024-05-09 NOTE — ED Notes (Signed)
 Husband leaving patient bedside to go home at this time.

## 2024-05-09 NOTE — ED Notes (Signed)
 Angie from cardiology called to make sure pt is not given Eliquis  order has been DC

## 2024-05-09 NOTE — ED Notes (Signed)
Patient readjusted in bed for comfort at this time.

## 2024-05-09 NOTE — Progress Notes (Signed)
 PHARMACY - ANTICOAGULATION CONSULT NOTE  Pharmacy Consult for heparin  Indication: atrial fibrillation (holding Eliquis )  Allergies  Allergen Reactions   Oxycodone Other (See Comments)    Severe headaches, hallucinations    Codeine Other (See Comments)    Severe Headaches with codeine; but not with other opioids    Patient Measurements: Height: 5\' 5"  (165.1 cm) Weight: 83.9 kg (185 lb) IBW/kg (Calculated) : 57 HEPARIN  DW (KG): 75  Vital Signs: Temp: 97.9 F (36.6 C) (05/13 0935) Temp Source: Oral (05/13 0935) BP: 121/74 (05/13 1015) Pulse Rate: 140 (05/13 1015)  Labs: Recent Labs    05/09/24 0932 05/09/24 0939  HGB 13.8 14.6  HCT 42.7 43.0  PLT 250  --   CREATININE 1.16* 1.30*    Estimated Creatinine Clearance: 37.6 mL/min (A) (by C-G formula based on SCr of 1.3 mg/dL (H)).   Medical History: Past Medical History:  Diagnosis Date   Arthritis    Atrial fibrillation with RVR (HCC) 09/21/2014   Bronchitis    h/o   Diabetes mellitus    NO MEDS,  DIET CONTROLLED   History of migraines    "when working under alot of stress"   Hypertension    Hypothyroid    LBBB (left bundle branch block) 09/22/2014   OSA (obstructive sleep apnea) 04/09/2015   does not use cpap   PONV (postoperative nausea and vomiting)    PT DOESNOT WANT TO TAKE HER ATENOLOL      DOS.   " IT DROPS MY BP TO LOW"     Assessment: 78 year old female presented from cardiac stress test. Patient reported feeling dizzy following procedure, found to be in afib with RVR and transferred to [facility]. She is on Eliquis  PTA for same. Her last dose was 5/12 in the evening. Per Cardiology, may need cardiac cath - holding Eliquis  at this time. Pharmacy consulted for heparin .  Goal of Therapy:  Heparin  level 0.3-0.7 units/ml aPTT 66-102 seconds Monitor platelets by anticoagulation protocol: Yes   Plan:  -Defer heparin  bolus -Heparin  infusion at 1100 units/hr -Check 8 hour aPTT/heparin  level -Daily  CBC  Lolita Rise, PharmD, BCPS Clinical Pharmacist 05/09/2024 2:13 PM

## 2024-05-10 DIAGNOSIS — E079 Disorder of thyroid, unspecified: Secondary | ICD-10-CM

## 2024-05-10 DIAGNOSIS — I4891 Unspecified atrial fibrillation: Secondary | ICD-10-CM

## 2024-05-10 DIAGNOSIS — I2510 Atherosclerotic heart disease of native coronary artery without angina pectoris: Secondary | ICD-10-CM

## 2024-05-10 DIAGNOSIS — I4819 Other persistent atrial fibrillation: Principal | ICD-10-CM

## 2024-05-10 DIAGNOSIS — E039 Hypothyroidism, unspecified: Secondary | ICD-10-CM

## 2024-05-10 LAB — APTT
aPTT: 55 s — ABNORMAL HIGH (ref 24–36)
aPTT: 80 s — ABNORMAL HIGH (ref 24–36)

## 2024-05-10 LAB — GLUCOSE, CAPILLARY
Glucose-Capillary: 142 mg/dL — ABNORMAL HIGH (ref 70–99)
Glucose-Capillary: 118 mg/dL — ABNORMAL HIGH (ref 70–99)
Glucose-Capillary: 149 mg/dL — ABNORMAL HIGH (ref 70–99)

## 2024-05-10 LAB — CBC
HCT: 37.3 % (ref 36.0–46.0)
Hemoglobin: 12 g/dL (ref 12.0–15.0)
MCH: 27 pg (ref 26.0–34.0)
MCHC: 32.2 g/dL (ref 30.0–36.0)
MCV: 84 fL (ref 80.0–100.0)
Platelets: 196 10*3/uL (ref 150–400)
RBC: 4.44 MIL/uL (ref 3.87–5.11)
RDW: 12.9 % (ref 11.5–15.5)
WBC: 5.5 10*3/uL (ref 4.0–10.5)
nRBC: 0 % (ref 0.0–0.2)

## 2024-05-10 LAB — CBG MONITORING, ED
Glucose-Capillary: 139 mg/dL — ABNORMAL HIGH (ref 70–99)
Glucose-Capillary: 184 mg/dL — ABNORMAL HIGH (ref 70–99)

## 2024-05-10 LAB — BASIC METABOLIC PANEL WITH GFR
Anion gap: 11 (ref 5–15)
BUN: 23 mg/dL (ref 8–23)
CO2: 20 mmol/L — ABNORMAL LOW (ref 22–32)
Calcium: 9.6 mg/dL (ref 8.9–10.3)
Chloride: 104 mmol/L (ref 98–111)
Creatinine, Ser: 0.88 mg/dL (ref 0.44–1.00)
GFR, Estimated: >60 mL/min (ref >60)
Glucose, Bld: 138 mg/dL — ABNORMAL HIGH (ref 70–99)
Potassium: 3.7 mmol/L (ref 3.5–5.1)
Sodium: 135 mmol/L (ref 135–145)

## 2024-05-10 LAB — TSH: TSH: <0.01 u[IU]/mL — ABNORMAL LOW (ref 0.350–4.500)

## 2024-05-10 LAB — MAGNESIUM: Magnesium: 1.4 mg/dL — ABNORMAL LOW (ref 1.7–2.4)

## 2024-05-10 MED ORDER — PNEUMOCOCCAL 20-VAL CONJ VACC 0.5 ML IM SUSY
0.5000 mL | PREFILLED_SYRINGE | INTRAMUSCULAR | Status: DC
  Filled 2024-05-10: qty 0.5

## 2024-05-10 MED ORDER — POTASSIUM CHLORIDE CRYS ER 20 MEQ PO TBCR
20.0000 meq | EXTENDED_RELEASE_TABLET | Freq: Once | ORAL | Status: AC
  Administered 2024-05-10: 20 meq via ORAL
  Filled 2024-05-10: qty 1

## 2024-05-10 MED ORDER — AMLODIPINE BESYLATE 5 MG PO TABS
5.0000 mg | ORAL_TABLET | Freq: Every morning | ORAL | Status: DC
  Administered 2024-05-10: 5 mg via ORAL
  Filled 2024-05-10: qty 1

## 2024-05-10 MED ORDER — HEPARIN BOLUS VIA INFUSION
2500.0000 [IU] | Freq: Once | INTRAVENOUS | Status: AC
  Administered 2024-05-10: 2500 [IU] via INTRAVENOUS
  Filled 2024-05-10: qty 2500

## 2024-05-10 MED ORDER — HYDROCHLOROTHIAZIDE 25 MG PO TABS
25.0000 mg | ORAL_TABLET | Freq: Every morning | ORAL | Status: DC
  Administered 2024-05-10 – 2024-05-11 (×2): 25 mg via ORAL
  Filled 2024-05-10 (×2): qty 1

## 2024-05-10 MED ORDER — LOSARTAN POTASSIUM 50 MG PO TABS
50.0000 mg | ORAL_TABLET | Freq: Every day | ORAL | Status: DC
  Administered 2024-05-10: 50 mg via ORAL
  Filled 2024-05-10: qty 1

## 2024-05-10 MED ORDER — AMLODIPINE BESYLATE 10 MG PO TABS
10.0000 mg | ORAL_TABLET | Freq: Every morning | ORAL | Status: DC
  Administered 2024-05-11: 10 mg via ORAL
  Filled 2024-05-10: qty 1

## 2024-05-10 MED ORDER — MAGNESIUM SULFATE 4 GM/100ML IV SOLN
4.0000 g | Freq: Once | INTRAVENOUS | Status: AC
  Administered 2024-05-10: 4 g via INTRAVENOUS
  Filled 2024-05-10: qty 100

## 2024-05-10 MED ORDER — PREGABALIN 75 MG PO CAPS
75.0000 mg | ORAL_CAPSULE | Freq: Two times a day (BID) | ORAL | Status: DC
  Administered 2024-05-10 – 2024-05-12 (×5): 75 mg via ORAL
  Filled 2024-05-10 (×5): qty 1

## 2024-05-10 NOTE — H&P (View-Only) (Signed)
 Rounding Note    Patient Name: Destiny Brown Date of Encounter: 05/10/2024  [facility] Cardiologist: Richardo Chandler, MD  Chief Complaint: Dizziness and weakness Reason of consult: Abnormal stress test and A-fib with RVR  Subjective   Patient seen and examined in resuscitation room A in [facility]. Hemodynamically stable. No chest pain. Accompanied by daughter and husband at bedside  Inpatient Medications    Scheduled Meds:  [START ON 05/11/2024] amLODipine   10 mg Oral q AM   aspirin  EC  81 mg Oral Daily   hydrochlorothiazide  25 mg Oral q AM   insulin  aspart  0-15 Units Subcutaneous TID WC   insulin  aspart  0-5 Units Subcutaneous QHS   levothyroxine   125 mcg Oral QAC breakfast   losartan   50 mg Oral Daily   metoprolol  tartrate  25 mg Oral BID   pregabalin  75 mg Oral BID   [START ON 05/11/2024] rosuvastatin  5 mg Oral Once per day on Monday Thursday   Continuous Infusions:  heparin  1,300 Units/hr (05/10/24 1054)   PRN Meds: acetaminophen  **OR** acetaminophen , ondansetron  **OR** ondansetron  (ZOFRAN ) IV, propofol , traZODone   Vital Signs    Vitals:   05/10/24 0909 05/10/24 1129 05/10/24 1131 05/10/24 1317  BP: (!) 175/81 (!) 139/52  (!) 142/69  Pulse: 70 (!) 55  (!) 54  Resp:  18  14  Temp:   97.7 F (36.5 C)   TempSrc:   Oral   SpO2:  100%  100%  Weight:      Height:        Intake/Output Summary (Last 24 hours) at 05/10/2024 1338 Last data filed at 05/10/2024 1125 Gross per 24 hour  Intake 305.97 ml  Output --  Net 305.97 ml      05/09/2024    9:28 AM 05/03/2024    8:12 AM 11/18/2023    8:00 AM  Last 3 Weights  Weight (lbs) 185 lb 189 lb 177 lb  Weight (kg) 83.915 kg 85.73 kg 80.287 kg      Telemetry    Sinus rhythm.- Personally Reviewed  ECG    No new tracing- Personally Reviewed  Physical Exam   General: Age appropriate, hemodynamically stable, no acute distress HEENT: Moist mucous membranes, trachea midline, no JVP Lungs:  Clear to auscultation bilaterally no wheezes rales or rhonchi's Heart: Regular, bradycardia, positive S1-S2 Abdomen: Soft, nontender, distended, positive bowel sounds in all 4 quadrants Extremities: No swelling, warm to touch Neuro: Oriented x 4, no acute distress, nonfocal Psych: Appropriate and cooperative  Labs    High Sensitivity Troponin:  No results for input(s): "TROPONINIHS" in the last 720 hours.   Chemistry Recent Labs  Lab 05/09/24 0932 05/09/24 0939 05/10/24 0650  NA 133* 137 135  K 4.1 4.2 3.7  CL 100 104 104  CO2 23  --  20*  GLUCOSE 166* 163* 138*  BUN 30* 28* 23  CREATININE 1.16* 1.30* 0.88  CALCIUM  9.8  --  9.6  MG  --   --  1.4*  GFRNONAA 48*  --  >60  ANIONGAP 10  --  11    Lipids No results for input(s): "CHOL", "TRIG", "HDL", "LABVLDL", "LDLCALC", "CHOLHDL" in the last 168 hours.  Hematology Recent Labs  Lab 05/09/24 0932 05/09/24 0939 05/10/24 0650  WBC 6.5  --  5.5  RBC 4.98  --  4.44  HGB 13.8 14.6 12.0  HCT 42.7 43.0 37.3  MCV 85.7  --  84.0  MCH 27.7  --  27.0  MCHC 32.3  --  32.2  RDW 13.0  --  12.9  PLT 250  --  196   Thyroid   Recent Labs  Lab 05/10/24 0740  TSH <0.010*    BNPNo results for input(s): "BNP", "PROBNP" in the last 168 hours.  DDimer No results for input(s): "DDIMER" in the last 168 hours.   Radiology    N/A  Cardiac Studies   PET stress test 05/09/24:     Findings are consistent with ischemia. The study is intermediate risk: Mild anterior ischemia with decrease in LAD stress flows.  LVEF is decreased but augments with stress and may be related to septal motion. Decrease iny myocardial blood flow reserve is partially underestimated due to high resting flows   LV perfusion is abnormal. There is evidence of ischemia. There is no evidence of infarction. Defect 1: There is a small defect with mild reduction in uptake present in the apical to mid anterior location(s) that is reversible. There is normal wall motion in  the defect area. Consistent with ischemia.   Rest left ventricular function is abnormal. Rest global function is moderately reduced. Rest EF: 35%. Stress left ventricular function is abnormal. Stress global function is mildly reduced. Stress EF: 42%. End diastolic cavity size is mildly enlarged. End systolic cavity size is mildly enlarged.   Myocardial blood flow was computed to be 1.75ml/g/min at rest and 1.90ml/g/min at stress. Global myocardial blood flow reserve was 1.58 and was abnormal.   Coronary calcium  assessment not performed due to prior revascularization. Aortic atherosclerosis.  Aortic valve calcification.   Electronically Signed  By: Gloriann Larger M.D.     Cath 04/23/2023   Ost LAD to Prox LAD lesion is 50% stenosed.   Prox Cx to Mid Cx lesion is 80% stenosed.   1st RPL lesion is 60% stenosed.   A drug-eluting stent was successfully placed using a SYNERGY XD 2.75X28.   Post intervention, there is a 0% residual stenosis.   1.  Widely patent left main with no significant stenosis 2.  Patent LAD with 40 to 50% proximal stenosis, nonobstructive disease appropriate for medical therapy 3.  Severe mid circumflex stenosis treated successfully with a 2.75 x 28 mm Synergy DES 4.  Large, dominant RCA with mild nonobstructive plaquing and moderate focal stenosis in a large posterolateral branch 5.  Normal LVEDP   Recommendations: Resume apixaban  tomorrow.  Clopidogrel  600 mg administered orally on the table during the procedure.  Take clopidogrel  75 mg daily starting tomorrow for at least 3 months, then consider transition to aspirin  81 mg daily in the setting of chronic oral anticoagulation.    Same day DC if criteria met  Patient Profile     78 y.o. female with a hx of PAF on OAC since 2015, CAD with recent PCI/stent to LCX 2024, LBBB, DM2, HTN, and HLD who is being seen  for the evaluation of Afib RVR at the request of Dr. Jannette Mend.   Assessment & Plan    Impression    Recommendations:  Wide-complex tachycardia - suggestive of ventricular tachycardia (present on arrival) Reviewed EKG with EP on-call 05/09/2024 shared decision was that her symptoms/presentation/EKG concerning for ventricular tachycardia. Due to hemodynamic instability shared decision was obtained the patient, husband, and myself and underwent direct-current cardioversion with 200 J x 1.  Patient was restored to normal sinus rhythm. Overnight she remains in sinus rhythm and has not had any sustained arrhythmias or A-fib with RVR.  Atrial fibrillation with rapid  ventricular rate. Chronic anticoagulation Persistent A-fib status post direct-current cardioversion 05/09/2024 Currently on metoprolol  25 mg p.o. twice daily, heparin  drip Transition IV heparin  drip to oral anticoagulation status post heart catheterization Patient is aware of not to interrupt anticoagulation for at least 4 weeks secondary to recent cardioversion Telemetry reviewed  Coronary artery disease with anginal chest pain. Abnormal cardiac PET/CT Has been experiencing anginal chest pain at home which led to outpatient cardiac PET/CT which noted reversible ischemia in the anterior precordium likely LAD distribution. Scheduled for left heart catheterization with possible intervention tomorrow 05/11/2024 Informed Consent   Shared Decision Making/Informed Consent The risks [stroke (1 in 1000), death (1 in 1000), kidney failure [usually temporary] (1 in 500), bleeding (1 in 200), allergic reaction [possibly serious] (1 in 200)], benefits (diagnostic support and management of coronary artery disease) and alternatives of a cardiac catheterization were discussed in detail with Ms. Jefferys and she is willing to proceed.     Hypertension Blood pressures are not well-controlled. Increase  amlodipine  to 10 mg p.o. daily. Was getting 5mg  po qday here & at home she is on 5mg  bid. Continue hydrochlorothiazide 25 mg p.o. daily. Resume  losartan  50 mg p.o. daily, at home she is on 100 mg p.o. daily  Thyroid  disease: TSH is low suggestive of hyperthyroidism. Will defer workup to primary team.    For questions or updates, please contact [facility] Please consult [email or web address] for contact info under      Signed, Olinda Bertrand, DO, [facility]  [facility]  Pager: [phone] Office: [phone] 05/10/2024, 1:38 PM

## 2024-05-10 NOTE — ED Notes (Signed)
 Lab to add on hemoglobin A1C at this time per lab

## 2024-05-10 NOTE — Progress Notes (Addendum)
 [facility] night cross cover note:   I was contacted by the patient's RN regarding this patient's most recent heart rates in the low 60s, with heart rates in the 50s noted earlier.  Not associated with any report of new symptoms.  Most recent systolic blood pressures in the 130s mmHg.  RN conveys that the patient is scheduled for  cardiac catheterization in the morning. I subsequently asked that this evening's  scheduled dose of PO metoprolol  be held.  Other vital signs at this time appears stable, including afebrile, with oxygen saturations in the high 90s on room air.  Will continue to monitor.      Camelia Cavalier, DO Hospitalist

## 2024-05-10 NOTE — Progress Notes (Signed)
 PHARMACY - ANTICOAGULATION CONSULT NOTE  Pharmacy Consult for heparin  Indication: atrial fibrillation (holding Eliquis )  Allergies  Allergen Reactions   Oxycodone Other (See Comments)    Severe headaches, hallucinations    Codeine Other (See Comments)    Severe Headaches with codeine; but not with other opioids    Patient Measurements: Height: 5\' 5"  (165.1 cm) Weight: 83.9 kg (185 lb) IBW/kg (Calculated) : 57 HEPARIN  DW (KG): 75  Vital Signs: Temp: 98 F (36.7 C) (05/13 2207) Temp Source: Oral (05/13 2207) BP: 149/61 (05/13 2245) Pulse Rate: 58 (05/13 2245)  Labs: Recent Labs    05/09/24 0932 05/09/24 0939 05/09/24 2313  HGB 13.8 14.6  --   HCT 42.7 43.0  --   PLT 250  --   --   APTT  --   --  99*  HEPARINUNFRC  --   --  >1.10*  CREATININE 1.16* 1.30*  --     Estimated Creatinine Clearance: 37.6 mL/min (A) (by C-G formula based on SCr of 1.3 mg/dL (H)).   Medical History: Past Medical History:  Diagnosis Date   Arthritis    Atrial fibrillation with RVR (HCC) 09/21/2014   Bronchitis    h/o   Diabetes mellitus    NO MEDS,  DIET CONTROLLED   History of migraines    "when working under alot of stress"   Hypertension    Hypothyroid    LBBB (left bundle branch block) 09/22/2014   OSA (obstructive sleep apnea) 04/09/2015   does not use cpap   PONV (postoperative nausea and vomiting)    PT DOESNOT WANT TO TAKE HER ATENOLOL      DOS.   " IT DROPS MY BP TO LOW"     Assessment: 78 year old female presented from cardiac stress test. Patient reported feeling dizzy following procedure, found to be in afib with RVR and transferred to [facility]. She is on Eliquis  PTA for same. Her last dose was 5/12 in the evening. Per Cardiology, may need cardiac cath - holding Eliquis  at this time. Pharmacy consulted for heparin .  05/10/2024: aPTT 99 sec- therapeutic on IV heparin  1100 units/hr HL >1.1 - elevated as expected due to recent Eliquis  dosing.  Will continue to adjust  heparin  using aPTT until heparin  level correlates with aPTT No bleeding or infusion related concerns reported by RN  Goal of Therapy:  Heparin  level 0.3-0.7 units/ml aPTT 66-102 seconds Monitor platelets by anticoagulation protocol: Yes   Plan:  - Continue heparin  infusion at 1100 units/hr - Check confirmatory aPTT at 0800 - Daily CBC & heparin  level while on heparin   Arie Kurtz, PharmD, BCPS Clinical Pharmacist 05/10/2024 12:37 AM

## 2024-05-10 NOTE — ED Notes (Signed)
 Spoke with Destiny Brown in pharmacy. Patient is ok to stay running at the 1100 units/hr with heparin  at this time resulting in this decision from APTT and heparin  level collect at 2313 resulted at 2343

## 2024-05-10 NOTE — Progress Notes (Addendum)
  Progress Note   Patient: Destiny Brown WGN:[id number] DOB: 03/17/1946 DOA: 05/09/2024     1 DOS: the patient was seen and examined on 05/10/2024 at 7:23AM      Brief hospital course: 78 y.o. F with pAF on Eliquis , hx CAD s/p PCI to LCx 4/24, LBBB, DM, HTN, obesity, chart history RA not on DMARD, CKD IIIa baseline 1.1-1.2 and hypothyroidism who presented for outpatient cardiac PET, developed dizziness, chest pressure afterwards.  Found to be in Afib RVR.  ?VT and so underwent DCCV on 5/13 in the ER.  Cardiology recommend heparin  gtt and transfer to [facility] for LHC      Assessment and Plan: Angina Coronary artery disease - Continue aspirin  - Continue heparin  - Continue metoprolol  - Continue Crestor - Consult cardiology, appreciate cares - Transfer to [facility] for ischemic evaluation   Atrial fibrillation with RVR Paroxysmal atrial fibrillation Admitted on diltiazem  drip, unable to achieve rate control despite maximal infusion, underwent DCCV on 5/13 - Continue metoprolol  - Continue heparin  drip - Hold home Eliquis  - Keep K>4, mag>2   Obesity Class I obesity, BMI 30.8, complicates care  Diabetes Diabetic neuropathy Glucoses normal - Continue insulin  corrections - Hold metformin  - Resume Lyrica  Hypertension Blood pressure elevatd this morning - Rsume amlodipine , HCT - Hold losartan , furosemide  - Continue metoprolol  new  Hypothyroidism - Continue LT4 - Follow TSH  CKD IIIa Cr lower today off losartan  and furosemide .  AKI ruled out.  Baseine appears to be 1.1-1.2 over several years     Subjective: Patient is feeling well, she did not sleep much last night, she has no chest pressure, palpitations, dizziness, confusion.     Physical Exam: BP (!) 175/81   Pulse 70   Temp 98.7 F (37.1 C) (Oral)   Resp 17   Ht 5\' 5"  (1.651 m)   Wt 83.9 kg   SpO2 99%   BMI 30.79 kg/m   Adult female, ambulating in the ER bedroom, sitting in a chair, interactive and  appropriate Heart rate normal, no murmurs, no JVD, no pitting edema in extremities Respiratory rate normal, lungs clear without rales or wheezes Attention normal, affect appropriate, judgment and insight appear normal    Data Reviewed: Discussed with cardiology team Recent PET/CT showed intermediate risk study Telemetry reviewed, normal sinus rhythm Basic metabolic panel shows creatinine down to 0.9, sodium and potassium normal CBC normal  Family Communication: None    Disposition: Status is: Inpatient Transfer to [facility]. Will need LHC.  If LHC normal, possibly home same day if Cardiology plan no further work up or inptaitent med titration        Author: Ephriam Hashimoto, MD 05/10/2024 7:44 AM  For on call review [email or web address].

## 2024-05-10 NOTE — Progress Notes (Signed)
 Rounding Note    Patient Name: Destiny Brown Date of Encounter: 05/10/2024  [facility] Cardiologist: Richardo Chandler, MD  Chief Complaint: Dizziness and weakness Reason of consult: Abnormal stress test and A-fib with RVR  Subjective   Patient seen and examined in resuscitation room A in [facility]. Hemodynamically stable. No chest pain. Accompanied by daughter and husband at bedside  Inpatient Medications    Scheduled Meds:  [START ON 05/11/2024] amLODipine   10 mg Oral q AM   aspirin  EC  81 mg Oral Daily   hydrochlorothiazide  25 mg Oral q AM   insulin  aspart  0-15 Units Subcutaneous TID WC   insulin  aspart  0-5 Units Subcutaneous QHS   levothyroxine   125 mcg Oral QAC breakfast   losartan   50 mg Oral Daily   metoprolol  tartrate  25 mg Oral BID   pregabalin  75 mg Oral BID   [START ON 05/11/2024] rosuvastatin  5 mg Oral Once per day on Monday Thursday   Continuous Infusions:  heparin  1,300 Units/hr (05/10/24 1054)   PRN Meds: acetaminophen  **OR** acetaminophen , ondansetron  **OR** ondansetron  (ZOFRAN ) IV, propofol , traZODone   Vital Signs    Vitals:   05/10/24 0909 05/10/24 1129 05/10/24 1131 05/10/24 1317  BP: (!) 175/81 (!) 139/52  (!) 142/69  Pulse: 70 (!) 55  (!) 54  Resp:  18  14  Temp:   97.7 F (36.5 C)   TempSrc:   Oral   SpO2:  100%  100%  Weight:      Height:        Intake/Output Summary (Last 24 hours) at 05/10/2024 1338 Last data filed at 05/10/2024 1125 Gross per 24 hour  Intake 305.97 ml  Output --  Net 305.97 ml      05/09/2024    9:28 AM 05/03/2024    8:12 AM 11/18/2023    8:00 AM  Last 3 Weights  Weight (lbs) 185 lb 189 lb 177 lb  Weight (kg) 83.915 kg 85.73 kg 80.287 kg      Telemetry    Sinus rhythm.- Personally Reviewed  ECG    No new tracing- Personally Reviewed  Physical Exam   General: Age appropriate, hemodynamically stable, no acute distress HEENT: Moist mucous membranes, trachea midline, no JVP Lungs:  Clear to auscultation bilaterally no wheezes rales or rhonchi's Heart: Regular, bradycardia, positive S1-S2 Abdomen: Soft, nontender, distended, positive bowel sounds in all 4 quadrants Extremities: No swelling, warm to touch Neuro: Oriented x 4, no acute distress, nonfocal Psych: Appropriate and cooperative  Labs    High Sensitivity Troponin:  No results for input(s): "TROPONINIHS" in the last 720 hours.   Chemistry Recent Labs  Lab 05/09/24 0932 05/09/24 0939 05/10/24 0650  NA 133* 137 135  K 4.1 4.2 3.7  CL 100 104 104  CO2 23  --  20*  GLUCOSE 166* 163* 138*  BUN 30* 28* 23  CREATININE 1.16* 1.30* 0.88  CALCIUM  9.8  --  9.6  MG  --   --  1.4*  GFRNONAA 48*  --  >60  ANIONGAP 10  --  11    Lipids No results for input(s): "CHOL", "TRIG", "HDL", "LABVLDL", "LDLCALC", "CHOLHDL" in the last 168 hours.  Hematology Recent Labs  Lab 05/09/24 0932 05/09/24 0939 05/10/24 0650  WBC 6.5  --  5.5  RBC 4.98  --  4.44  HGB 13.8 14.6 12.0  HCT 42.7 43.0 37.3  MCV 85.7  --  84.0  MCH 27.7  --  27.0  MCHC 32.3  --  32.2  RDW 13.0  --  12.9  PLT 250  --  196   Thyroid   Recent Labs  Lab 05/10/24 0740  TSH <0.010*    BNPNo results for input(s): "BNP", "PROBNP" in the last 168 hours.  DDimer No results for input(s): "DDIMER" in the last 168 hours.   Radiology    N/A  Cardiac Studies   PET stress test 05/09/24:     Findings are consistent with ischemia. The study is intermediate risk: Mild anterior ischemia with decrease in LAD stress flows.  LVEF is decreased but augments with stress and may be related to septal motion. Decrease iny myocardial blood flow reserve is partially underestimated due to high resting flows   LV perfusion is abnormal. There is evidence of ischemia. There is no evidence of infarction. Defect 1: There is a small defect with mild reduction in uptake present in the apical to mid anterior location(s) that is reversible. There is normal wall motion in  the defect area. Consistent with ischemia.   Rest left ventricular function is abnormal. Rest global function is moderately reduced. Rest EF: 35%. Stress left ventricular function is abnormal. Stress global function is mildly reduced. Stress EF: 42%. End diastolic cavity size is mildly enlarged. End systolic cavity size is mildly enlarged.   Myocardial blood flow was computed to be 1.75ml/g/min at rest and 1.90ml/g/min at stress. Global myocardial blood flow reserve was 1.58 and was abnormal.   Coronary calcium  assessment not performed due to prior revascularization. Aortic atherosclerosis.  Aortic valve calcification.   Electronically Signed  By: Gloriann Larger M.D.     Cath 04/23/2023   Ost LAD to Prox LAD lesion is 50% stenosed.   Prox Cx to Mid Cx lesion is 80% stenosed.   1st RPL lesion is 60% stenosed.   A drug-eluting stent was successfully placed using a SYNERGY XD 2.75X28.   Post intervention, there is a 0% residual stenosis.   1.  Widely patent left main with no significant stenosis 2.  Patent LAD with 40 to 50% proximal stenosis, nonobstructive disease appropriate for medical therapy 3.  Severe mid circumflex stenosis treated successfully with a 2.75 x 28 mm Synergy DES 4.  Large, dominant RCA with mild nonobstructive plaquing and moderate focal stenosis in a large posterolateral branch 5.  Normal LVEDP   Recommendations: Resume apixaban  tomorrow.  Clopidogrel  600 mg administered orally on the table during the procedure.  Take clopidogrel  75 mg daily starting tomorrow for at least 3 months, then consider transition to aspirin  81 mg daily in the setting of chronic oral anticoagulation.    Same day DC if criteria met  Patient Profile     78 y.o. female with a hx of PAF on OAC since 2015, CAD with recent PCI/stent to LCX 2024, LBBB, DM2, HTN, and HLD who is being seen  for the evaluation of Afib RVR at the request of Dr. Jannette Mend.   Assessment & Plan    Impression    Recommendations:  Wide-complex tachycardia - suggestive of ventricular tachycardia (present on arrival) Reviewed EKG with EP on-call 05/09/2024 shared decision was that her symptoms/presentation/EKG concerning for ventricular tachycardia. Due to hemodynamic instability shared decision was obtained the patient, husband, and myself and underwent direct-current cardioversion with 200 J x 1.  Patient was restored to normal sinus rhythm. Overnight she remains in sinus rhythm and has not had any sustained arrhythmias or A-fib with RVR.  Atrial fibrillation with rapid  ventricular rate. Chronic anticoagulation Persistent A-fib status post direct-current cardioversion 05/09/2024 Currently on metoprolol  25 mg p.o. twice daily, heparin  drip Transition IV heparin  drip to oral anticoagulation status post heart catheterization Patient is aware of not to interrupt anticoagulation for at least 4 weeks secondary to recent cardioversion Telemetry reviewed  Coronary artery disease with anginal chest pain. Abnormal cardiac PET/CT Has been experiencing anginal chest pain at home which led to outpatient cardiac PET/CT which noted reversible ischemia in the anterior precordium likely LAD distribution. Scheduled for left heart catheterization with possible intervention tomorrow 05/11/2024 Informed Consent   Shared Decision Making/Informed Consent The risks [stroke (1 in 1000), death (1 in 1000), kidney failure [usually temporary] (1 in 500), bleeding (1 in 200), allergic reaction [possibly serious] (1 in 200)], benefits (diagnostic support and management of coronary artery disease) and alternatives of a cardiac catheterization were discussed in detail with Ms. Jefferys and she is willing to proceed.     Hypertension Blood pressures are not well-controlled. Increase  amlodipine  to 10 mg p.o. daily. Was getting 5mg  po qday here & at home she is on 5mg  bid. Continue hydrochlorothiazide 25 mg p.o. daily. Resume  losartan  50 mg p.o. daily, at home she is on 100 mg p.o. daily  Thyroid  disease: TSH is low suggestive of hyperthyroidism. Will defer workup to primary team.    For questions or updates, please contact [facility] Please consult [email or web address] for contact info under      Signed, Olinda Bertrand, DO, [facility]  [facility]  Pager: [phone] Office: [phone] 05/10/2024, 1:38 PM

## 2024-05-10 NOTE — ED Notes (Signed)
 RN called [facility] and [facility]E and gave report to [employer], [employer]

## 2024-05-10 NOTE — Progress Notes (Signed)
 PHARMACY - ANTICOAGULATION CONSULT NOTE  Pharmacy Consult for heparin  Indication: atrial fibrillation (holding Eliquis )  Allergies  Allergen Reactions   Oxycodone Other (See Comments)    Severe headaches, hallucinations    Codeine Other (See Comments)    Severe Headaches with codeine; but not with other opioids    Patient Measurements: Height: 5\' 5"  (165.1 cm) Weight: 83.9 kg (185 lb) IBW/kg (Calculated) : 57 HEPARIN  DW (KG): 75  Vital Signs: Temp: 98.7 F (37.1 C) (05/14 0742) Temp Source: Oral (05/14 0742) BP: 175/81 (05/14 0909) Pulse Rate: 70 (05/14 0909)  Labs: Recent Labs    05/09/24 0932 05/09/24 0939 05/09/24 2313 05/10/24 0650 05/10/24 0733  HGB 13.8 14.6  --  12.0  --   HCT 42.7 43.0  --  37.3  --   PLT 250  --   --  196  --   APTT  --   --  99*  --  55*  HEPARINUNFRC  --   --  >1.10*  --   --   CREATININE 1.16* 1.30*  --  0.88  --     Estimated Creatinine Clearance: 55.5 mL/min (by C-G formula based on SCr of 0.88 mg/dL).    Assessment: 78 year old female presented from cardiac stress test. Patient reported feeling dizzy following procedure, found to be in afib with RVR and transferred to [facility]. She is on Eliquis  PTA for same. Her last dose was 5/12 in the evening. Per Cardiology, may need cardiac cath - holding Eliquis  at this time. Pharmacy consulted for heparin .  05/10/2024: First aPTT 99 sec- therapeutic on IV heparin  1100 units/hr Confirmatory aPTT 55 sec- subtherapeutic on heparin  1100 units/hr> per RN drip is infusing well w/no interruptions/issues HL >1.1 - elevated as expected due to recent Eliquis  dosing.  Will continue to adjust heparin  using aPTT until heparin  level correlates with aPTT No bleeding or infusion related concerns reported by RN CBC remains WNL Plans: to [facility] for LHC when cath lab available  Goal of Therapy:  Heparin  level 0.3-0.7 units/ml aPTT 66-102 seconds Monitor platelets by anticoagulation protocol: Yes   Plan:   - heparin  2500 bolus IV x 1 - increase heparin  infusion to 1300 units/hr - Check  aPTT 8 hrs after bolus and rate increase - Daily CBC, aPTT and heparin  level while on heparin  -  heparin  to be turned off on call to cath lab> timing to be determined by cath lab availability  Rubie Corona, Pharm.D Use secure chat for questions 05/10/2024 9:42 AM

## 2024-05-11 ENCOUNTER — Encounter (HOSPITAL_COMMUNITY): Payer: Self-pay | Admitting: Cardiovascular Disease

## 2024-05-11 ENCOUNTER — Encounter (HOSPITAL_COMMUNITY)
Admission: EM | Disposition: A | Discharge status: Home / Self Care | Payer: Self-pay | Source: Home / Self Care | Attending: Internal Medicine

## 2024-05-11 DIAGNOSIS — Z7901 Long term (current) use of anticoagulants: Secondary | ICD-10-CM

## 2024-05-11 DIAGNOSIS — I214 Non-ST elevation (NSTEMI) myocardial infarction: Secondary | ICD-10-CM

## 2024-05-11 DIAGNOSIS — E0580 Other thyrotoxicosis without thyrotoxic crisis or storm: Secondary | ICD-10-CM

## 2024-05-11 DIAGNOSIS — E0590 Thyrotoxicosis, unspecified without thyrotoxic crisis or storm: Secondary | ICD-10-CM

## 2024-05-11 HISTORY — PX: LEFT HEART CATH AND CORONARY ANGIOGRAPHY: CATH118249

## 2024-05-11 LAB — CBC
HCT: 38.2 % (ref 36.0–46.0)
Hemoglobin: 12.8 g/dL (ref 12.0–15.0)
MCH: 27.8 pg (ref 26.0–34.0)
MCHC: 33.5 g/dL (ref 30.0–36.0)
MCV: 83 fL (ref 80.0–100.0)
Platelets: 185 10*3/uL (ref 150–400)
RBC: 4.6 MIL/uL (ref 3.87–5.11)
RDW: 12.8 % (ref 11.5–15.5)
WBC: 4.4 10*3/uL (ref 4.0–10.5)
nRBC: 0 % (ref 0.0–0.2)

## 2024-05-11 LAB — T4, FREE: Free T4: 1.97 ng/dL — ABNORMAL HIGH (ref 0.61–1.12)

## 2024-05-11 LAB — GLUCOSE, CAPILLARY
Glucose-Capillary: 140 mg/dL — ABNORMAL HIGH (ref 70–99)
Glucose-Capillary: 121 mg/dL — ABNORMAL HIGH (ref 70–99)
Glucose-Capillary: 194 mg/dL — ABNORMAL HIGH (ref 70–99)
Glucose-Capillary: 143 mg/dL — ABNORMAL HIGH (ref 70–99)

## 2024-05-11 LAB — BASIC METABOLIC PANEL WITH GFR
Anion gap: 11 (ref 5–15)
Anion gap: 10 (ref 5–15)
BUN: 24 mg/dL — ABNORMAL HIGH (ref 8–23)
BUN: 23 mg/dL (ref 8–23)
CO2: 21 mmol/L — ABNORMAL LOW (ref 22–32)
CO2: 22 mmol/L (ref 22–32)
Calcium: 9.8 mg/dL (ref 8.9–10.3)
Calcium: 9.9 mg/dL (ref 8.9–10.3)
Chloride: 103 mmol/L (ref 98–111)
Chloride: 104 mmol/L (ref 98–111)
Creatinine, Ser: 1.47 mg/dL — ABNORMAL HIGH (ref 0.44–1.00)
Creatinine, Ser: 1.4 mg/dL — ABNORMAL HIGH (ref 0.44–1.00)
GFR, Estimated: 36 mL/min — ABNORMAL LOW (ref >60)
GFR, Estimated: 38 mL/min — ABNORMAL LOW
Glucose, Bld: 132 mg/dL — ABNORMAL HIGH (ref 70–99)
Glucose, Bld: 138 mg/dL — ABNORMAL HIGH (ref 70–99)
Potassium: 4.3 mmol/L (ref 3.5–5.1)
Potassium: 4 mmol/L (ref 3.5–5.1)
Sodium: 135 mmol/L (ref 135–145)
Sodium: 136 mmol/L (ref 135–145)

## 2024-05-11 LAB — APTT: aPTT: 83 s — ABNORMAL HIGH (ref 24–36)

## 2024-05-11 LAB — MAGNESIUM
Magnesium: 2.2 mg/dL (ref 1.7–2.4)
Magnesium: 2 mg/dL (ref 1.7–2.4)

## 2024-05-11 LAB — HEMOGLOBIN A1C
Hgb A1c MFr Bld: 6.6 % — ABNORMAL HIGH (ref 4.8–5.6)
Mean Plasma Glucose: 143 mg/dL

## 2024-05-11 LAB — HEPARIN LEVEL (UNFRACTIONATED): Heparin Unfractionated: 0.98 [IU]/mL — ABNORMAL HIGH (ref 0.30–0.70)

## 2024-05-11 SURGERY — LEFT HEART CATH AND CORONARY ANGIOGRAPHY
Anesthesia: LOCAL

## 2024-05-11 MED ORDER — SODIUM CHLORIDE 0.9% FLUSH
3.0000 mL | Freq: Two times a day (BID) | INTRAVENOUS | Status: DC
  Administered 2024-05-11 – 2024-05-12 (×2): 3 mL via INTRAVENOUS

## 2024-05-11 MED ORDER — LIDOCAINE HCL (PF) 1 % IJ SOLN
INTRAMUSCULAR | Status: AC
  Filled 2024-05-11: qty 30

## 2024-05-11 MED ORDER — LEVOTHYROXINE SODIUM 100 MCG PO TABS
100.0000 ug | ORAL_TABLET | Freq: Every day | ORAL | Status: DC
  Administered 2024-05-12: 100 ug via ORAL
  Filled 2024-05-11: qty 2

## 2024-05-11 MED ORDER — VERAPAMIL HCL 2.5 MG/ML IV SOLN
INTRAVENOUS | Status: AC
Start: 2024-05-11 — End: ?
  Filled 2024-05-11: qty 2

## 2024-05-11 MED ORDER — DILTIAZEM HCL-DEXTROSE 125-5 MG/125ML-% IV SOLN (PREMIX)
5.0000 mg/h | INTRAVENOUS | Status: DC
  Administered 2024-05-11: 5 mg/h via INTRAVENOUS
  Filled 2024-05-11: qty 125

## 2024-05-11 MED ORDER — FENTANYL CITRATE (PF) 100 MCG/2ML IJ SOLN
INTRAMUSCULAR | Status: AC
Start: 2024-05-11 — End: ?
  Filled 2024-05-11: qty 2

## 2024-05-11 MED ORDER — HEPARIN (PORCINE) IN NACL 2000-0.9 UNIT/L-% IV SOLN
INTRAVENOUS | Status: DC | PRN
  Administered 2024-05-11: 1000 mL

## 2024-05-11 MED ORDER — SODIUM CHLORIDE 0.9 % WEIGHT BASED INFUSION
1.0000 mL/kg/h | INTRAVENOUS | Status: DC
  Administered 2024-05-11: 1 mL/kg/h via INTRAVENOUS

## 2024-05-11 MED ORDER — SODIUM CHLORIDE 0.9% FLUSH: 3.0000 mL | INTRAVENOUS | Status: DC | PRN

## 2024-05-11 MED ORDER — ASPIRIN 81 MG PO CHEW
81.0000 mg | CHEWABLE_TABLET | ORAL | Status: AC
  Administered 2024-05-11: 81 mg via ORAL
  Filled 2024-05-11: qty 1

## 2024-05-11 MED ORDER — HEPARIN SODIUM (PORCINE) 1000 UNIT/ML IJ SOLN
INTRAMUSCULAR | Status: AC
  Filled 2024-05-11: qty 10

## 2024-05-11 MED ORDER — SODIUM CHLORIDE 0.9 % WEIGHT BASED INFUSION
3.0000 mL/kg/h | INTRAVENOUS | Status: DC
  Administered 2024-05-11: 3 mL/kg/h via INTRAVENOUS

## 2024-05-11 MED ORDER — HEPARIN SODIUM (PORCINE) 1000 UNIT/ML IJ SOLN
INTRAMUSCULAR | Status: DC | PRN
  Administered 2024-05-11: 4000 [IU] via INTRAVENOUS

## 2024-05-11 MED ORDER — VERAPAMIL HCL 2.5 MG/ML IV SOLN
INTRAVENOUS | Status: DC | PRN
  Administered 2024-05-11: 10 mL via INTRA_ARTERIAL

## 2024-05-11 MED ORDER — METOPROLOL TARTRATE 25 MG PO TABS
25.0000 mg | ORAL_TABLET | Freq: Three times a day (TID) | ORAL | Status: DC
  Administered 2024-05-12: 25 mg via ORAL
  Filled 2024-05-11: qty 1

## 2024-05-11 MED ORDER — APIXABAN 5 MG PO TABS
5.0000 mg | ORAL_TABLET | Freq: Two times a day (BID) | ORAL | Status: DC
  Administered 2024-05-11 – 2024-05-12 (×2): 5 mg via ORAL
  Filled 2024-05-11 (×2): qty 1

## 2024-05-11 MED ORDER — MIDAZOLAM HCL 2 MG/2ML IJ SOLN
INTRAMUSCULAR | Status: DC | PRN
  Administered 2024-05-11: 1 mg via INTRAVENOUS

## 2024-05-11 MED ORDER — IOHEXOL 350 MG/ML SOLN
INTRAVENOUS | Status: DC | PRN
  Administered 2024-05-11: 35 mL

## 2024-05-11 MED ORDER — MIDAZOLAM HCL 2 MG/2ML IJ SOLN
INTRAMUSCULAR | Status: AC
  Filled 2024-05-11: qty 2

## 2024-05-11 MED ORDER — FENTANYL CITRATE (PF) 100 MCG/2ML IJ SOLN
INTRAMUSCULAR | Status: DC | PRN
  Administered 2024-05-11: 25 ug via INTRAVENOUS

## 2024-05-11 MED ORDER — SODIUM CHLORIDE 0.9 % IV SOLN
INTRAVENOUS | Status: AC
Start: 2024-05-11 — End: 2024-05-11

## 2024-05-11 MED ORDER — METOPROLOL TARTRATE 25 MG PO TABS
25.0000 mg | ORAL_TABLET | Freq: Two times a day (BID) | ORAL | Status: DC
Start: 2024-05-11 — End: 2024-05-11

## 2024-05-11 MED ORDER — SODIUM CHLORIDE 0.9 % IV SOLN: 250.0000 mL | INTRAVENOUS | Status: AC | PRN

## 2024-05-11 SURGICAL SUPPLY — 8 items
CATH INFINITI AMBI 5FR JK (CATHETERS) IMPLANT
CATH INFINITI JR4 5F (CATHETERS) IMPLANT
DEVICE RAD COMP TR BAND LRG (VASCULAR PRODUCTS) IMPLANT
GLIDESHEATH SLEND SS 6F .021 (SHEATH) IMPLANT
GUIDEWIRE INQWIRE 1.5J.035X260 (WIRE) IMPLANT
PACK CARDIAC CATHETERIZATION (CUSTOM PROCEDURE TRAY) ×2 IMPLANT
SET ATX-X65L (MISCELLANEOUS) IMPLANT
SHEATH PROBE COVER 6X72 (BAG) IMPLANT

## 2024-05-11 NOTE — TOC CM/SW Note (Signed)
 Transition of Care [facility]) - Inpatient Brief Assessment   Patient Details  Name: Destiny Brown MRN: [id number] Date of Birth: 1946-01-12  [facility] CM/SW Contact:    Cosimo Diones, RN Phone Number: 05/11/2024, 3:32 PM   Clinical Narrative: Patient presented for dizziness and weakness- Atrial Fib. PTA patient was from home with spouse. Patient has insurance and PCP- she states she gets to appointments without any issues. No home needs identified at this time.    Transition of Care Asessment: Insurance and Status: Insurance coverage has been reviewed Patient has primary care physician: Yes Home environment has been reviewed: reviewed Prior level of function:: independent Prior/Current Home Services: No current home services Social Drivers of Health Review: SDOH reviewed no interventions necessary Readmission risk has been reviewed: Yes Transition of care needs: no transition of care needs at this time

## 2024-05-11 NOTE — Interval H&P Note (Signed)
 History and Physical Interval Note:  05/11/2024 9:42 AM  Destiny Brown  has presented today for surgery, with the diagnosis of nstemi.  The various methods of treatment have been discussed with the patient and family. After consideration of risks, benefits and other options for treatment, the patient has consented to  Procedure(s): LEFT HEART CATH AND CORONARY ANGIOGRAPHY (N/A) as a surgical intervention.  The patient's history has been reviewed, patient examined, no change in status, stable for surgery.  I have reviewed the patient's chart and labs.  Questions were answered to the patient's satisfaction.     Bethann Qualley

## 2024-05-11 NOTE — Progress Notes (Signed)
 Progress Note   Patient: Destiny Brown ZOX:[id number] DOB: 01-Sep-1946 DOA: 05/09/2024     2 DOS: the patient was seen and examined on 05/11/2024   Brief hospital course:  78 y.o. F with pAF on Eliquis , hx CAD s/p PCI to LCx 4/24, LBBB, DM, HTN, obesity, chart history RA not on DMARD, CKD IIIa baseline 1.1-1.2 and hypothyroidism who presented for outpatient cardiac PET, developed dizziness, chest pressure afterwards.   Found to be in Afib RVR.  ?VT and so underwent DCCV on 5/13 in the ER.  Cardiology recommend heparin  gtt and transfer to [facility] for LHC   Assessment and Plan:  Wide-complex tachycardia - Noted on presentation.  Status post DCCV x 1 with return of NSR.  Atypical chest pain with history of CAD and abnormal cardiac PET/CT - Suspicion for ACS.  Was transitioned to [facility] for [facility].  LHC showing nonobstructive CAD with recommendations for medical management, no stent placement.  Atrial fibrillation with RVR - Status post DCCV 5/13.  However reappeared after LHC upon returning to the floor.  Rates in the 130s 140s.  Cardiology following closely.  Initiated on IV Cardizem .  Anticoagulation on board.  Continue on telemetry.  Hyperthyroidism - TSH markedly low with mildly elevated T4.  Will recommend reducing levothyroxine  to 100 mcg daily.  Recheck TSH in 4-8 weeks.  Hypertension - Currently on metoprolol  25 mg twice daily.  Diabetes mellitus - Insulin  sliding scale on board.  CKD 3A - Creatinine precarious however does not appear to be above baseline.  Will recheck BMP in a.m. given cath contrast.    Subjective: Patient just returned from cardiac catheterization this morning.  Resting comfortably, denies fever, chills, chest pain, shortness of breath, nausea, vomiting, abdominal pain.  Noted A-fib on monitor with rates in the 120s-140s.  Patient largely asymptomatic.  Physical Exam: Vitals:   05/11/24 0500 05/11/24 0733 05/11/24 0948 05/11/24 1134  BP:  (!) 131/53   119/79  Pulse:  (!) 52  (!) 109  Resp:  18  18  Temp:  97.8 F (36.6 C)  97.9 F (36.6 C)  TempSrc:  Oral  Oral  SpO2:  100% 100% 97%  Weight: 84.6 kg     Height:        GENERAL:  Alert, pleasant, no acute distress  HEENT:  EOMI CARDIOVASCULAR: Irregularly irregular and tachycardic, no murmurs appreciated RESPIRATORY:  Clear to auscultation, no wheezing, rales, or rhonchi GASTROINTESTINAL:  Soft, nontender, nondistended EXTREMITIES:  No LE edema bilaterally NEURO:  No new focal deficits appreciated SKIN: Pressure bandage in right wrist PSYCH:  Appropriate mood and affect     Data Reviewed:  No new imaging to review  Labs: CBC: Recent Labs  Lab 05/09/24 0932 05/09/24 0939 05/10/24 0650 05/11/24 0542  WBC 6.5  --  5.5 4.4  HGB 13.8 14.6 12.0 12.8  HCT 42.7 43.0 37.3 38.2  MCV 85.7  --  84.0 83.0  PLT 250  --  196 185   Basic Metabolic Panel: Recent Labs  Lab 05/09/24 0932 05/09/24 0939 05/10/24 0650 05/11/24 0542 05/11/24 0832  NA 133* 137 135 135 136  K 4.1 4.2 3.7 4.3 4.0  CL 100 104 104 103 104  CO2 23  --  20* 21* 22  GLUCOSE 166* 163* 138* 132* 138*  BUN 30* 28* 23 24* 23  CREATININE 1.16* 1.30* 0.88 1.47* 1.40*  CALCIUM  9.8  --  9.6 9.8 9.9  MG  --   --  1.4*  2.2 2.0   Liver Function Tests: No results for input(s): "AST", "ALT", "ALKPHOS", "BILITOT", "PROT", "ALBUMIN " in the last 168 hours. CBG: Recent Labs  Lab 05/10/24 1428 05/10/24 1608 05/10/24 2155 05/11/24 0730 05/11/24 1137  GLUCAP 142* 118* 149* 140* 121*    Scheduled Meds:  aspirin  EC  81 mg Oral Daily   insulin  aspart  0-15 Units Subcutaneous TID WC   insulin  aspart  0-5 Units Subcutaneous QHS   [START ON 05/12/2024] levothyroxine   100 mcg Oral Q0600   metoprolol  tartrate  25 mg Oral BID   pneumococcal 20-valent conjugate vaccine  0.5 mL Intramuscular Tomorrow-1000   pregabalin  75 mg Oral BID   rosuvastatin  5 mg Oral Once per day on Monday Thursday   sodium chloride   flush  3 mL Intravenous Q12H   Continuous Infusions:  sodium chloride  75 mL/hr (05/11/24 1041)   sodium chloride      diltiazem  (CARDIZEM ) infusion 5 mg/hr (05/11/24 1153)   PRN Meds:.sodium chloride , acetaminophen  **OR** acetaminophen , ondansetron  **OR** ondansetron  (ZOFRAN ) IV, sodium chloride  flush, traZODone  Family Communication: Family at bedside  Disposition: Status is: Inpatient Remains inpatient appropriate because: A-fib with RVR     Time spent: 38 minutes  Author: Jodeane Mulligan, DO 05/11/2024 2:42 PM  For on call review [email or web address].

## 2024-05-11 NOTE — Plan of Care (Signed)

## 2024-05-11 NOTE — Progress Notes (Signed)
 PHARMACY - ANTICOAGULATION CONSULT NOTE  Pharmacy Consult for apixaban  Indication: atrial fibrillation  Allergies  Allergen Reactions   Oxycodone Other (See Comments)    Severe headaches, hallucinations    Codeine Other (See Comments)    Severe Headaches with codeine; but not with other opioids    Patient Measurements: Height: 5\' 5"  (165.1 cm) Weight: 84.6 kg (186 lb 8 oz) IBW/kg (Calculated) : 57 HEPARIN  DW (KG): 73.7  Vital Signs: Temp: 97.9 F (36.6 C) (05/15 1134) Temp Source: Oral (05/15 1134) BP: 119/79 (05/15 1134) Pulse Rate: 109 (05/15 1134)  Labs: Recent Labs    05/09/24 0932 05/09/24 0939 05/09/24 0939 05/09/24 2313 05/10/24 0650 05/10/24 0733 05/10/24 1847 05/11/24 0542 05/11/24 0832  HGB 13.8 14.6  --   --  12.0  --   --  12.8  --   HCT 42.7 43.0  --   --  37.3  --   --  38.2  --   PLT 250  --   --   --  196  --   --  185  --   APTT  --   --    < > 99*  --  55* 80* 83*  --   HEPARINUNFRC  --   --   --  >1.10*  --   --   --  0.98*  --   CREATININE 1.16* 1.30*  --   --  0.88  --   --  1.47* 1.40*   < > = values in this interval not displayed.    Estimated Creatinine Clearance: 35 mL/min (A) (by C-G formula based on SCr of 1.4 mg/dL (H)).   Medical History: Past Medical History:  Diagnosis Date   Arthritis    Atrial fibrillation with RVR (HCC) 09/21/2014   Bronchitis    h/o   Diabetes mellitus    NO MEDS,  DIET CONTROLLED   History of migraines    "when working under alot of stress"   Hypertension    Hypothyroid    LBBB (left bundle branch block) 09/22/2014   OSA (obstructive sleep apnea) 04/09/2015   does not use cpap   PONV (postoperative nausea and vomiting)    PT DOESNOT WANT TO TAKE HER ATENOLOL      DOS.   " IT DROPS MY BP TO LOW"    Medications:  Medications Prior to Admission  Medication Sig Dispense Refill Last Dose/Taking   albuterol  (VENTOLIN  HFA) 108 (90 Base) MCG/ACT inhaler Inhale 2 puffs into the lungs every 6 (six)  hours as needed for wheezing or shortness of breath (Cough). 18 g 2 Unknown   amLODipine  (NORVASC ) 5 MG tablet Take 5 mg by mouth in the morning.   05/08/2024   apixaban  (ELIQUIS ) 5 MG TABS tablet Take 1 tablet (5 mg total) by mouth 2 (two) times daily. 180 tablet 3 05/08/2024 at  6:00 PM   aspirin  EC 81 MG tablet Take 81 mg by mouth daily.   05/08/2024   Cholecalciferol  (VITAMIN D3) 1000 UNITS CAPS Take 1,000 Units by mouth in the morning.   05/08/2024   cyanocobalamin (VITAMIN B12) 500 MCG tablet Take 500 mcg by mouth daily.   05/08/2024   furosemide  (LASIX ) 20 MG tablet Take 1 tablet (20 mg total) by mouth daily. 90 tablet 3 05/08/2024   hydrochlorothiazide (HYDRODIURIL) 25 MG tablet Take 25 mg by mouth in the morning.   05/08/2024   latanoprost  (XALATAN ) 0.005 % ophthalmic solution Place 1 drop into both eyes daily  at 12 noon.   05/08/2024   levothyroxine  (SYNTHROID , LEVOTHROID) 125 MCG tablet Take 125 mcg by mouth daily before breakfast.   05/08/2024   losartan  (COZAAR ) 100 MG tablet Take 1 tablet (100 mg total) by mouth daily. 90 tablet 3 05/08/2024   metFORMIN  (GLUCOPHAGE ) 1000 MG tablet Take 1,000 mg by mouth 2 (two) times daily.   05/08/2024   rosuvastatin (CRESTOR) 5 MG tablet Take 5 mg by mouth 2 (two) times a week. Tuesdays & Thursdays.   Past Week   cyanocobalamin (FINEST NUTRITION VITAMIN B-12) 500 MCG tablet       pregabalin (LYRICA) 75 MG capsule Take 75 mg by mouth 2 (two) times daily.       Assessment: 78 yo female s/p cath on apixaban  PTA for afib. Plans are to resume apixaban  tonight if no bleeding issues with TR band removal  -hg= 12.8 -SCr 1.4. Wt 84.6kg  Goal of Therapy:  Monitor platelets by anticoagulation protocol: Yes   Plan:  -Restart apixaban  5mg  po bid tonight if no bleeding issues after TR band removal -Will follow later today  Baxter Limber, PharmD Clinical Pharmacist **Pharmacist phone directory can now be found on [email or web address] ([email or web address]).  Listed under [facility]  Pharmacy.

## 2024-05-11 NOTE — Progress Notes (Addendum)
 [PCP OBSERVATION: ON LONG TERM THYROID  REPLACEMENT WITH 125 MCG TSH 0.61 FEB 2025   Patient Name: Destiny Brown Date of Encounter: 05/11/2024 [facility] Cardiologist: Richardo Chandler, MD   Interval Summary  .    Went to cath lab this morning. The procedure went well and they found non obstructive CAD. After arriving back on the floor the patient went back into atrial fibrillation with rvr.  Vital Signs .    Vitals:   05/11/24 0437 05/11/24 0500 05/11/24 0733 05/11/24 0948  BP: (!) 134/50  (!) 131/53   Pulse: (!) 59  (!) 52   Resp: 18  18   Temp: 97.8 F (36.6 C)  97.8 F (36.6 C)   TempSrc: Oral  Oral   SpO2: 97%  100% 100%  Weight:  84.6 kg    Height:        Intake/Output Summary (Last 24 hours) at 05/11/2024 1056 Last data filed at 05/11/2024 0304 Gross per 24 hour  Intake 236.63 ml  Output --  Net 236.63 ml      05/11/2024    5:00 AM 05/10/2024    2:24 PM 05/09/2024    9:28 AM  Last 3 Weights  Weight (lbs) 186 lb 8 oz 175 lb 185 lb  Weight (kg) 84.596 kg 79.379 kg 83.915 kg      Telemetry/ECG    Was initially in NSR but went into A-fib with RVR at 10:35 after arriving back on the floor rates are currently in the 130's - Personally Reviewed  Physical Exam .   GEN: No acute distress.   Neck: No JVD Cardiac: irregularly irregular rate and rhythm. no murmurs, rubs, or gallops. Radial pulses diminished 1+ Respiratory: Clear to auscultation bilaterally. GI: Soft, nontender, non-distended  MS: No edema  Assessment & Plan .     Wide-complex tachycardia - suggestive of ventricular tachycardia (present on arrival) Reviewed EKG with EP on-call 05/09/2024 shared decision was that her symptoms/presentation/EKG concerning for ventricular tachycardia. Due to hemodynamic instability shared decision was obtained the patient, husband, and myself and underwent direct-current cardioversion with 200 J x 1.  Patient was restored to normal sinus rhythm.    Atrial  fibrillation with rapid ventricular rate. Chronic anticoagulation Echo on 01/2023 showed EF 55-60% Persistent A-fib status post direct-current cardioversion 05/09/2024 Currently on metoprolol  25 mg p.o. twice daily, heparin  drip Plan to transition to Eliquis  5 mg BID later today after TR band removed if site has minimal hematoma. After getting LHC the patient went into atrial fibrillation with rvr in the 130's Start IV Cardizem  Patient is aware of not to interrupt anticoagulation for at least 4 weeks secondary to recent cardioversion Telemetry reviewed May need repeat echo performed when not in atrial fibrillation with RVR.   Coronary artery disease with anginal chest pain. Abnormal cardiac PET/CT Has been experiencing anginal chest pain at home which led to outpatient cardiac PET/CT which noted reversible ischemia in the anterior precordium likely LAD distribution.  left heart catheterization showed non obstructive CAD and the recommendation was to manage medically     Hypertension Blood pressures continue to be elevated but are going down. Hold amlodipine  to 10 mg p.o. daily. Was getting 5mg  po qday here & at home she is on 5mg  bid. Hold hydrochlorothiazide 25 mg p.o. daily. Hold losartan  50 mg p.o. daily, at home she is on 100 mg p.o. daily Currently on metoprolol  25 mg p.o BID   Thyroid  disease: TSH is low suggestive of hyperthyroidism.  Will defer workup to primary team.  For questions or updates, please contact [facility] Please consult [email or web address] for contact info under        Signed, Zane Adams, PA-C

## 2024-05-11 NOTE — Progress Notes (Signed)
 Rounding Note    Patient Name: Destiny Brown Date of Encounter: 05/11/2024  [facility] Cardiologist: Richardo Chandler, MD  Chief Complaint: Dizziness and weakness Reason of consult: Abnormal stress test and A-fib with RVR  Subjective   Patient seen and examined at bedside. Status post heart catheterization. Went back into A-fib with RVR. No family at bedside  Inpatient Medications    Scheduled Meds:  apixaban   5 mg Oral BID   aspirin  EC  81 mg Oral Daily   insulin  aspart  0-15 Units Subcutaneous TID WC   insulin  aspart  0-5 Units Subcutaneous QHS   [START ON 05/12/2024] levothyroxine   100 mcg Oral Q0600   metoprolol  tartrate  25 mg Oral BID   pneumococcal 20-valent conjugate vaccine  0.5 mL Intramuscular Tomorrow-1000   pregabalin  75 mg Oral BID   rosuvastatin  5 mg Oral Once per day on Monday Thursday   sodium chloride  flush  3 mL Intravenous Q12H   Continuous Infusions:  sodium chloride      diltiazem  (CARDIZEM ) infusion 5 mg/hr (05/11/24 1700)   PRN Meds: sodium chloride , acetaminophen  **OR** acetaminophen , ondansetron  **OR** ondansetron  (ZOFRAN ) IV, sodium chloride  flush, traZODone   Vital Signs    Vitals:   05/11/24 0733 05/11/24 0948 05/11/24 1134 05/11/24 1546  BP: (!) 131/53  119/79 (!) 112/55  Pulse: (!) 52  (!) 109 71  Resp: 18  18 18   Temp: 97.8 F (36.6 C)  97.9 F (36.6 C) 97.8 F (36.6 C)  TempSrc: Oral  Oral Oral  SpO2: 100% 100% 97% 97%  Weight:      Height:        Intake/Output Summary (Last 24 hours) at 05/11/2024 1948 Last data filed at 05/11/2024 1808 Gross per 24 hour  Intake 1063.73 ml  Output --  Net 1063.73 ml      05/11/2024    5:00 AM 05/10/2024    2:24 PM 05/09/2024    9:28 AM  Last 3 Weights  Weight (lbs) 186 lb 8 oz 175 lb 185 lb  Weight (kg) 84.596 kg 79.379 kg 83.915 kg      Telemetry   A-fib with RVR.- Personally Reviewed  ECG    No new tracing- Personally Reviewed  Physical Exam   General: Age  appropriate, hemodynamically stable, no acute distress HEENT: Moist mucous membranes, trachea midline, no JVP Lungs: Clear to auscultation bilaterally no wheezes rales or rhonchi's Heart: Irregularly irregular, variable S1-S2, no murmurs rubs or gallops appreciated secondary to tachycardia Abdomen: Soft, nontender, distended, positive bowel sounds in all 4 quadrants Extremities: No swelling, warm to touch Neuro: Oriented x 4, no acute distress, nonfocal Psych: Appropriate and cooperative  Labs    High Sensitivity Troponin:  No results for input(s): "TROPONINIHS" in the last 720 hours.   Chemistry Recent Labs  Lab 05/10/24 0650 05/11/24 0542 05/11/24 0832  NA 135 135 136  K 3.7 4.3 4.0  CL 104 103 104  CO2 20* 21* 22  GLUCOSE 138* 132* 138*  BUN 23 24* 23  CREATININE 0.88 1.47* 1.40*  CALCIUM  9.6 9.8 9.9  MG 1.4* 2.2 2.0  GFRNONAA >60 36* 38*  ANIONGAP 11 11 10     Lipids No results for input(s): "CHOL", "TRIG", "HDL", "LABVLDL", "LDLCALC", "CHOLHDL" in the last 168 hours.  Hematology Recent Labs  Lab 05/09/24 0932 05/09/24 0939 05/10/24 0650 05/11/24 0542  WBC 6.5  --  5.5 4.4  RBC 4.98  --  4.44 4.60  HGB 13.8 14.6  12.0 12.8  HCT 42.7 43.0 37.3 38.2  MCV 85.7  --  84.0 83.0  MCH 27.7  --  27.0 27.8  MCHC 32.3  --  32.2 33.5  RDW 13.0  --  12.9 12.8  PLT 250  --  196 185   Thyroid   Recent Labs  Lab 05/10/24 0740 05/11/24 0832  TSH <0.010*  --   FREET4  --  1.97*    BNPNo results for input(s): "BNP", "PROBNP" in the last 168 hours.  DDimer No results for input(s): "DDIMER" in the last 168 hours.   Radiology    N/A  Cardiac Studies   PET stress test 05/09/24:     Findings are consistent with ischemia. The study is intermediate risk: Mild anterior ischemia with decrease in LAD stress flows.  LVEF is decreased but augments with stress and may be related to septal motion. Decrease iny myocardial blood flow reserve is partially underestimated due to high  resting flows   LV perfusion is abnormal. There is evidence of ischemia. There is no evidence of infarction. Defect 1: There is a small defect with mild reduction in uptake present in the apical to mid anterior location(s) that is reversible. There is normal wall motion in the defect area. Consistent with ischemia.   Rest left ventricular function is abnormal. Rest global function is moderately reduced. Rest EF: 35%. Stress left ventricular function is abnormal. Stress global function is mildly reduced. Stress EF: 42%. End diastolic cavity size is mildly enlarged. End systolic cavity size is mildly enlarged.   Myocardial blood flow was computed to be 1.45ml/g/min at rest and 1.90ml/g/min at stress. Global myocardial blood flow reserve was 1.58 and was abnormal.   Coronary calcium  assessment not performed due to prior revascularization. Aortic atherosclerosis.  Aortic valve calcification.   Electronically Signed  By: Gloriann Larger M.D.     Cath 05/11/2024   1st RPL lesion is 60% stenosed.   Ost LAD to Prox LAD lesion is 40% stenosed.   Ost LM lesion is 20% stenosed.   Non-stenotic Prox Cx to Mid Cx lesion was previously treated.   1.  Widely patent left circumflex stent with stable moderate proximal LAD stenosis.  No evidence of obstructive disease. 2.  Left ventricular angiography was not performed due to chronic kidney disease.  Mildly elevated left ventricular end-diastolic pressure at 20 mmHg.   Recommendations: Continue medical therapy for coronary artery disease. Continue management of atrial fibrillation. Heparin  drip can be resumed 2 hours after TR band removal and she can be transition back to apixaban  in the evening if no further plans for any other invasive procedures. Monitor renal function.  35 mL of contrast was used for the procedure.    Patient Profile     78 y.o. female with a hx of PAF on OAC since 2015, CAD with recent PCI/stent to LCX 2024, LBBB, DM2, HTN, and HLD  who is being seen  for the evaluation of Afib RVR at the request of Dr. Jannette Mend.   Assessment & Plan    Impression   Recommendations:   Atrial fibrillation with rapid ventricular rate. Chronic anticoagulation Patient seen today post heart catheterization due to reoccurrence of A-fib with RVR. Underwent direct-current cardioversion on 05/09/2024 due to concerns for A-fib with RVR with wide-complex tachycardia that suggests VT Currently on Lopressor  25 mg p.o. twice daily, will transition to 3 times daily dosing with holding parameters Patient started on Cardizem  drip again. Will be transitioning from IV heparin  drip  to Eliquis  later today, after post heart catheterization restrictions are over Recurrence of A-fib with RVR likely secondary to iatrogenic hyperthyroidism. Telemetry reviewed  Wide-complex tachycardia - suggestive of ventricular tachycardia (present on arrival) Reviewed EKG with EP on-call 05/09/2024 shared decision was that her symptoms/presentation/EKG concerning for ventricular tachycardia. Due to hemodynamic instability shared decision was obtained and underwent direct-current cardioversion with 200 J x 1.  Patient was restored to normal sinus rhythm. However after heart catheterization was back into A-fib with RVR.  Coronary artery disease with anginal chest pain. Abnormal cardiac PET/CT Has been experiencing anginal chest pain at home which led to outpatient cardiac PET/CT which noted reversible ischemia in the anterior precordium likely LAD distribution. No new obstructive coronary disease prior left heart catheterization.  Hypertension Blood pressures are better controlled. Losartan  held secondary to renal insufficiency. Monitor BUN and creatinine. Will uptitrate GDMT and diuretics tomorrow.    Thyroid  disease: Suggestive of iatrogenic hyperthyroidism. Management per primary team Likely the cause of recurrent A-fib with RVR   For questions or updates, please  contact [facility] Please consult [email or web address] for contact info under      Signed, Olinda Bertrand, DO, [facility]  [facility]  05/11/2024

## 2024-05-11 NOTE — Hospital Course (Signed)
 78 y.o. F with pAF on Eliquis , hx CAD s/p PCI to LCx 4/24, LBBB, DM, HTN, obesity, chart history RA not on DMARD, CKD IIIa baseline 1.1-1.2 and hypothyroidism who presented for outpatient cardiac PET, developed dizziness, chest pressure afterwards.   Found to be in Afib RVR.  ?VT and so underwent DCCV on 5/13 in the ER.  Cardiology recommend heparin  gtt and transfer to [facility] for LHC    Assessment and Plan:   Wide-complex tachycardia - Noted on presentation.  Status post DCCV x 1 with return of NSR.   Atypical chest pain with history of CAD and abnormal cardiac PET/CT - Suspicion for ACS.  Was transitioned to [facility] for [facility].  LHC showing nonobstructive CAD with recommendations for medical management, no stent placement.   Atrial fibrillation with RVR - Status post DCCV 5/13.  However reappeared after LHC upon returning to the floor.  Rates in the 130s 140s.  Cardiology following closely.  Initiated on IV Cardizem .  Anticoagulation on board.  Continue on telemetry.   Hyperthyroidism - TSH markedly low with mildly elevated T4.  Will recommend reducing levothyroxine  to 100 mcg daily.  Recheck TSH in 4-8 weeks.   Hypertension - Currently on metoprolol  25 mg twice daily.   Diabetes mellitus - Insulin  sliding scale on board.   CKD 3A - Creatinine precarious however does not appear to be above baseline.  Will recheck BMP in a.m. given cath contrast.

## 2024-05-12 DIAGNOSIS — R001 Bradycardia, unspecified: Secondary | ICD-10-CM

## 2024-05-12 DIAGNOSIS — E0580 Other thyrotoxicosis without thyrotoxic crisis or storm: Secondary | ICD-10-CM

## 2024-05-12 DIAGNOSIS — Z5181 Encounter for therapeutic drug level monitoring: Secondary | ICD-10-CM

## 2024-05-12 DIAGNOSIS — Z7901 Long term (current) use of anticoagulants: Secondary | ICD-10-CM

## 2024-05-12 LAB — GLUCOSE, CAPILLARY
Glucose-Capillary: 120 mg/dL — ABNORMAL HIGH (ref 70–99)
Glucose-Capillary: 198 mg/dL — ABNORMAL HIGH (ref 70–99)

## 2024-05-12 LAB — BASIC METABOLIC PANEL WITH GFR
Anion gap: 8 (ref 5–15)
BUN: 24 mg/dL — ABNORMAL HIGH (ref 8–23)
CO2: 19 mmol/L — ABNORMAL LOW (ref 22–32)
Calcium: 9.6 mg/dL (ref 8.9–10.3)
Chloride: 109 mmol/L (ref 98–111)
Creatinine, Ser: 1.26 mg/dL — ABNORMAL HIGH (ref 0.44–1.00)
GFR, Estimated: 43 mL/min — ABNORMAL LOW (ref >60)
Glucose, Bld: 127 mg/dL — ABNORMAL HIGH (ref 70–99)
Potassium: 4.3 mmol/L (ref 3.5–5.1)
Sodium: 136 mmol/L (ref 135–145)

## 2024-05-12 LAB — CBC
HCT: 36.7 % (ref 36.0–46.0)
Hemoglobin: 11.9 g/dL — ABNORMAL LOW (ref 12.0–15.0)
MCH: 27.4 pg (ref 26.0–34.0)
MCHC: 32.4 g/dL (ref 30.0–36.0)
MCV: 84.6 fL (ref 80.0–100.0)
Platelets: 193 10*3/uL (ref 150–400)
RBC: 4.34 MIL/uL (ref 3.87–5.11)
RDW: 12.9 % (ref 11.5–15.5)
WBC: 4.5 10*3/uL (ref 4.0–10.5)
nRBC: 0 % (ref 0.0–0.2)

## 2024-05-12 LAB — MAGNESIUM: Magnesium: 1.8 mg/dL (ref 1.7–2.4)

## 2024-05-12 MED ORDER — LEVOTHYROXINE SODIUM 100 MCG PO TABS: 100.0000 ug | ORAL_TABLET | Freq: Every day | ORAL | 1 refills | Status: DC

## 2024-05-12 MED ORDER — APIXABAN 5 MG PO TABS: 5.0000 mg | ORAL_TABLET | Freq: Two times a day (BID) | ORAL | 1 refills | Status: AC

## 2024-05-12 MED ORDER — METOPROLOL TARTRATE 25 MG PO TABS: 25.0000 mg | ORAL_TABLET | Freq: Two times a day (BID) | ORAL | 1 refills | Status: DC

## 2024-05-12 MED FILL — Lidocaine HCl Local Preservative Free (PF) Inj 1%: INTRAMUSCULAR | Qty: 30 | Status: AC

## 2024-05-12 NOTE — Discharge Summary (Signed)
 Physician Discharge Summary   Patient: Destiny Brown MRN: [id number] DOB: [birth year]  Admit date:     05/09/2024  Discharge date: 05/12/24  Discharge Physician: Jodeane Mulligan   PCP: Rosslyn Coons, MD   Recommendations at discharge:    Pt to be discharged home.   If you experience worsening fever, chills, chest pain, shortness of breath, or other concerning symptoms, please call your PCP or go to the emergency department immediately.  Discharge Diagnoses: Principal Problem:   Atrial fibrillation with rapid ventricular response (HCC) Active Problems:   Atrial fibrillation with RVR (HCC)   Ventricular tachycardia (HCC)   Long term (current) use of anticoagulants   Benign hypertension   Abnormal findings diagnostic imaging of heart and coronary circulation   Atherosclerotic heart disease   Wide-complex tachycardia   Persistent atrial fibrillation (HCC)   Thyroid  disease   Atrial fibrillation status post cardioversion [facility])   Iatrogenic hyperthyroidism   Sinus bradycardia   Monitoring for long-term anticoagulant use  Resolved Problems:   * No resolved hospital problems. [facility] Course:  78 y.o. F with pAF on Eliquis , hx CAD s/p PCI to LCx 4/24, LBBB, DM, HTN, obesity, chart history RA not on DMARD, CKD IIIa baseline 1.1-1.2 and hypothyroidism who presented for outpatient cardiac PET, developed dizziness, chest pressure afterwards.   Found to be in Afib RVR.  ?VT and so underwent DCCV on 5/13 in the ER.  Cardiology recommend heparin  gtt and transfer to [facility] for LHC    Assessment and Plan:   Wide-complex tachycardia - Noted on presentation.  Status post DCCV x 1 with return of NSR 5/13.   Atypical chest pain with history of CAD and abnormal cardiac PET/CT - Suspicion for ACS.  Was transitioned to [facility] for [facility].  LHC showing nonobstructive CAD with recommendations for medical management, no stent placement.   Atrial fibrillation with RVR - Status post DCCV 5/13.   However reappeared after LHC upon returning to the floor.  Rates in the 130s-140s.  Cardiology following closely.  Initiated on IV Cardizem .  Spontaneously converted to NSR last night.  Bradycardia noted so Cardizem  drip discontinued.  Anticoagulation on board.  Given normal sinus rhythm, stable vitals, okay to discharge home.  Patient will be discharged with metoprolol  25 mg twice daily with holding parameters systolic blood pressure less than 100, heart rate less than 60.  Refill for Eliquis  provided as well.  Patient to follow-up with cardiology in 1 to 2 weeks, referral ordered.  Hyperthyroidism - TSH markedly low with mildly elevated T4.  Reduced levothyroxine  to 100 mcg daily.  Prescription provided.  Recheck TSH in 4-8 weeks with primary care.   Hypertension - Currently well-controlled on metoprolol  25 mg twice daily.  Discontinue her other previous antihypertensive regiment including amlodipine , HCTZ, losartan , Lasix .  Patient will need to follow-up with PCP or cardiology in the outpatient setting to reevaluate antihypertensive regimen if necessary.   Diabetes mellitus - Resume home metformin .   CKD 3A - Creatinine precarious however does not appear to be above baseline.    Consultants: Cardiology Procedures performed: DCCV Disposition: Home Diet recommendation:  Discharge Diet Orders (From admission, onward)     Start     Ordered   05/12/24 0000  Diet - low sodium heart healthy        05/12/24 1449           Cardiac and Carb modified diet  DISCHARGE MEDICATION: Allergies as of 05/12/2024  Reactions   Oxycodone Other (See Comments)   Severe headaches, hallucinations    Codeine Other (See Comments)   Severe Headaches with codeine; but not with other opioids        Medication List     STOP taking these medications    amLODipine  5 MG tablet Commonly known as: NORVASC    furosemide  20 MG tablet Commonly known as: LASIX    hydrochlorothiazide 25 MG  tablet Commonly known as: HYDRODIURIL   losartan  100 MG tablet Commonly known as: COZAAR        TAKE these medications    albuterol  108 (90 Base) MCG/ACT inhaler Commonly known as: VENTOLIN  HFA Inhale 2 puffs into the lungs every 6 (six) hours as needed for wheezing or shortness of breath (Cough).   apixaban  5 MG Tabs tablet Commonly known as: ELIQUIS  Take 1 tablet (5 mg total) by mouth 2 (two) times daily.   aspirin  EC 81 MG tablet Take 81 mg by mouth daily.   cyanocobalamin 500 MCG tablet Commonly known as: VITAMIN B12 Take 500 mcg by mouth daily.   [employer] Vitamin B-12 500 MCG tablet Generic drug: cyanocobalamin   latanoprost  0.005 % ophthalmic solution Commonly known as: XALATAN  Place 1 drop into both eyes daily at 12 noon.   levothyroxine  100 MCG tablet Commonly known as: SYNTHROID  Take 1 tablet (100 mcg total) by mouth daily at 6 (six) AM. Start taking on: May 13, 2024 What changed:  medication strength how much to take when to take this   metFORMIN  1000 MG tablet Commonly known as: GLUCOPHAGE  Take 1,000 mg by mouth 2 (two) times daily.   metoprolol  tartrate 25 MG tablet Commonly known as: LOPRESSOR  Take 1 tablet (25 mg total) by mouth 2 (two) times daily. Hold mediation if SBP < 100 or if heart rate < 60.   pregabalin 75 MG capsule Commonly known as: LYRICA Take 75 mg by mouth 2 (two) times daily.   rosuvastatin 5 MG tablet Commonly known as: CRESTOR Take 5 mg by mouth 2 (two) times a week. Tuesdays & Thursdays.   Vitamin D3 25 MCG (1000 UT) Caps Take 1,000 Units by mouth in the morning.         Discharge Exam: Filed Weights   05/09/24 8657 05/10/24 1424 05/11/24 0500  Weight: 83.9 kg 79.4 kg 84.6 kg    GENERAL:  Alert, pleasant, no acute distress  HEENT:  EOMI CARDIOVASCULAR:  RRR, no murmurs appreciated RESPIRATORY:  Clear to auscultation, no wheezing, rales, or rhonchi GASTROINTESTINAL:  Soft, nontender,  nondistended EXTREMITIES:  No LE edema bilaterally NEURO:  No new focal deficits appreciated SKIN:  No rashes noted PSYCH:  Appropriate mood and affect     Condition at discharge: improving  The results of significant diagnostics from this hospitalization (including imaging, microbiology, ancillary and laboratory) are listed below for reference.   Imaging Studies: CARDIAC CATHETERIZATION Result Date: 05/11/2024   1st RPL lesion is 60% stenosed.   Ost LAD to Prox LAD lesion is 40% stenosed.   Ost LM lesion is 20% stenosed.   Non-stenotic Prox Cx to Mid Cx lesion was previously treated. 1.  Widely patent left circumflex stent with stable moderate proximal LAD stenosis.  No evidence of obstructive disease. 2.  Left ventricular angiography was not performed due to chronic kidney disease.  Mildly elevated left ventricular end-diastolic pressure at 20 mmHg. Recommendations: Continue medical therapy for coronary artery disease. Continue management of atrial fibrillation. Heparin  drip can be resumed 2 hours after TR  band removal and she can be transition back to apixaban  in the evening if no further plans for any other invasive procedures. Monitor renal function.  35 mL of contrast was used for the procedure.   NM PET CT CARDIAC PERFUSION MULTI W/ABSOLUTE BLOODFLOW Result Date: 05/09/2024   Findings are consistent with ischemia. The study is intermediate risk: Mild anterior ischemia with decrease in LAD stress flows.  LVEF is decreased but augments with stress and may be related to septal motion. Decrease iny myocardial blood flow reserve is partially underestimated due to high resting flows   LV perfusion is abnormal. There is evidence of ischemia. There is no evidence of infarction. Defect 1: There is a small defect with mild reduction in uptake present in the apical to mid anterior location(s) that is reversible. There is normal wall motion in the defect area. Consistent with ischemia.   Rest left  ventricular function is abnormal. Rest global function is moderately reduced. Rest EF: 35%. Stress left ventricular function is abnormal. Stress global function is mildly reduced. Stress EF: 42%. End diastolic cavity size is mildly enlarged. End systolic cavity size is mildly enlarged.   Myocardial blood flow was computed to be 1.74ml/g/min at rest and 1.90ml/g/min at stress. Global myocardial blood flow reserve was 1.58 and was abnormal.   Coronary calcium  assessment not performed due to prior revascularization. Aortic atherosclerosis.  Aortic valve calcification.   Electronically Signed  By: Gloriann Larger M.D. EXAM: OVER-READ INTERPRETATION  CT CHEST The following report is a limited chest CT over-read performed by radiologist Dr. Marcos Sevin [facility] on 05/09/2024. This over-read does not include interpretation of cardiac or coronary anatomy or pathology nor does it include evaluation of the PET data. The cardiac PET-CT interpretation by the cardiologist is attached. COMPARISON:  Cardiac CT 02/10/2023 FINDINGS: Mediastinum/Nodes: No enlarged lymph nodes within the visualized mediastinum. Lungs/Pleura: There is no pleural effusion. Stable linear scarring in both lungs. Upper abdomen: No significant findings in the visualized upper abdomen. Musculoskeletal/Chest wall: No chest wall mass or suspicious osseous findings within the visualized chest. Thoracic spinal stimulator, previous right shoulder reverse arthroplasty and lower cervical fusion noted. IMPRESSION: No significant extracardiac findings within the visualized chest. Electronically Signed   By: Elmon Hagedorn M.D.   On: 05/09/2024 10:19  DG Chest Port 1 View Result Date: 05/09/2024 CLINICAL DATA:  tachycardia. EXAM: PORTABLE CHEST 1 VIEW COMPARISON:  06/30/2020. FINDINGS: Bilateral lung fields are clear. Bilateral costophrenic angles are clear. Stable cardio-mediastinal silhouette. No acute osseous abnormalities. Right  reverse shoulder arthroplasty noted. Partially seen lower cervical spinal fixation hardware. The soft tissues are within normal limits. Neurostimulator device leads noted overlying the midthoracic spine region. IMPRESSION: No active disease. Electronically Signed   By: Beula Brunswick M.D.   On: 05/09/2024 09:58    Microbiology: Results for orders placed or performed during the hospital encounter of 02/17/22  Surgical PCR Screen     Status: None   Collection Time: 02/17/22  8:20 AM   Specimen: Nasal Mucosa; Nasal Swab  Result Value Ref Range Status   MRSA, PCR NEGATIVE NEGATIVE Final   Staphylococcus aureus NEGATIVE NEGATIVE Final    Comment: (NOTE) The Xpert SA Assay (FDA approved for NASAL specimens in patients 40 years of age and older), is one component of a comprehensive surveillance program. It is not intended to diagnose infection nor to guide or monitor treatment. Performed at [facility], [address], [place], [place] [address]  Labs: CBC: Recent Labs  Lab 05/09/24 0932 05/09/24 0939 05/10/24 0650 05/11/24 0542 05/12/24 0821  WBC 6.5  --  5.5 4.4 4.5  HGB 13.8 14.6 12.0 12.8 11.9*  HCT 42.7 43.0 37.3 38.2 36.7  MCV 85.7  --  84.0 83.0 84.6  PLT 250  --  196 185 193   Basic Metabolic Panel: Recent Labs  Lab 05/09/24 0932 05/09/24 0939 05/10/24 0650 05/11/24 0542 05/11/24 0832 05/12/24 0821  NA 133* 137 135 135 136 136  K 4.1 4.2 3.7 4.3 4.0 4.3  CL 100 104 104 103 104 109  CO2 23  --  20* 21* 22 19*  GLUCOSE 166* 163* 138* 132* 138* 127*  BUN 30* 28* 23 24* 23 24*  CREATININE 1.16* 1.30* 0.88 1.47* 1.40* 1.26*  CALCIUM  9.8  --  9.6 9.8 9.9 9.6  MG  --   --  1.4* 2.2 2.0 1.8   Liver Function Tests: No results for input(s): "AST", "ALT", "ALKPHOS", "BILITOT", "PROT", "ALBUMIN " in the last 168 hours. CBG: Recent Labs  Lab 05/11/24 1137 05/11/24 1613 05/11/24 2251 05/12/24 0746 05/12/24 1148  GLUCAP 121* 194* 143* 120* 198*     Discharge time spent: 46 minutes.  Signed: Jodeane Mulligan, DO [facility] 05/12/2024

## 2024-05-12 NOTE — Progress Notes (Signed)
 Progress Note   Patient: Destiny Brown WUX:[id number] DOB: 13-Sep-1946 DOA: 05/09/2024     3 DOS: the patient was seen and examined on 05/12/2024   Brief hospital course:  78 y.o. F with pAF on Eliquis , hx CAD s/p PCI to LCx 4/24, LBBB, DM, HTN, obesity, chart history RA not on DMARD, CKD IIIa baseline 1.1-1.2 and hypothyroidism who presented for outpatient cardiac PET, developed dizziness, chest pressure afterwards.   Found to be in Afib RVR.  ?VT and so underwent DCCV on 5/13 in the ER.  Cardiology recommend heparin  gtt and transfer to [facility] for LHC    Assessment and Plan:   Wide-complex tachycardia - Noted on presentation.  Status post DCCV x 1 with return of NSR 5/13.   Atypical chest pain with history of CAD and abnormal cardiac PET/CT - Suspicion for ACS.  Was transitioned to [facility] for [facility].  LHC showing nonobstructive CAD with recommendations for medical management, no stent placement.   Atrial fibrillation with RVR - Status post DCCV 5/13.  However reappeared after LHC upon returning to the floor.  Rates in the 130s-140s.  Cardiology following closely.  Initiated on IV Cardizem .  Spontaneously converted to NSR last night.  Bradycardia noted so did Cardizem  drip discontinued.  Anticoagulation on board.  Given normal sinus rhythm, stable vitals, will heed to cardiology for ongoing disposition.   Hyperthyroidism - TSH markedly low with mildly elevated T4.  Reduced levothyroxine  to 100 mcg daily.  Recheck TSH in 4-8 weeks.   Hypertension - Currently on metoprolol  25 mg twice daily.   Diabetes mellitus - Insulin  sliding scale on board.   CKD 3A - Creatinine precarious however does not appear to be above baseline.  Will recheck BMP in a.m. given cath contrast.   Subjective: Patient feeling well this morning.  Spontaneously converted to NSR overnight having episode of bradycardia and dizziness.  Cardizem  drip discontinued.  Rates well-controlled in the 70s this morning, blood  pressure stable.  Patient asymptomatic.  Denies any chest pain, fever, chills, nausea, vomiting, abdominal pain.  Anxious about having her A-fib return.  Physical Exam: Vitals:   05/11/24 2145 05/11/24 2350 05/12/24 0745 05/12/24 0901  BP:  (!) 141/56 (!) 156/61   Pulse: (!) 47  (!) 55 66  Resp:  18 16   Temp:  97.8 F (36.6 C) 97.9 F (36.6 C)   TempSrc:  Oral Oral   SpO2:  98% 99%   Weight:      Height:        GENERAL:  Alert, pleasant, no acute distress  HEENT:  EOMI CARDIOVASCULAR:  RRR, no murmurs appreciated RESPIRATORY:  Clear to auscultation, no wheezing, rales, or rhonchi GASTROINTESTINAL:  Soft, nontender, nondistended EXTREMITIES:  No LE edema bilaterally NEURO:  No new focal deficits appreciated SKIN:  No rashes noted PSYCH:  Appropriate mood and affect     Data Reviewed:  No new imaging to review  Labs: CBC: Recent Labs  Lab 05/09/24 0932 05/09/24 0939 05/10/24 0650 05/11/24 0542 05/12/24 0821  WBC 6.5  --  5.5 4.4 4.5  HGB 13.8 14.6 12.0 12.8 11.9*  HCT 42.7 43.0 37.3 38.2 36.7  MCV 85.7  --  84.0 83.0 84.6  PLT 250  --  196 185 193   Basic Metabolic Panel: Recent Labs  Lab 05/09/24 0932 05/09/24 0939 05/10/24 0650 05/11/24 0542 05/11/24 0832 05/12/24 0821  NA 133* 137 135 135 136 136  K 4.1 4.2 3.7 4.3 4.0 4.3  CL 100 104 104 103 104 109  CO2 23  --  20* 21* 22 19*  GLUCOSE 166* 163* 138* 132* 138* 127*  BUN 30* 28* 23 24* 23 24*  CREATININE 1.16* 1.30* 0.88 1.47* 1.40* 1.26*  CALCIUM  9.8  --  9.6 9.8 9.9 9.6  MG  --   --  1.4* 2.2 2.0 1.8   Liver Function Tests: No results for input(s): "AST", "ALT", "ALKPHOS", "BILITOT", "PROT", "ALBUMIN " in the last 168 hours. CBG: Recent Labs  Lab 05/11/24 0730 05/11/24 1137 05/11/24 1613 05/11/24 2251 05/12/24 0746  GLUCAP 140* 121* 194* 143* 120*    Scheduled Meds:  apixaban   5 mg Oral BID   aspirin  EC  81 mg Oral Daily   insulin  aspart  0-15 Units Subcutaneous TID WC   insulin   aspart  0-5 Units Subcutaneous QHS   levothyroxine   100 mcg Oral Q0600   metoprolol  tartrate  25 mg Oral TID   pneumococcal 20-valent conjugate vaccine  0.5 mL Intramuscular Tomorrow-1000   pregabalin  75 mg Oral BID   rosuvastatin  5 mg Oral Once per day on Monday Thursday   sodium chloride  flush  3 mL Intravenous Q12H   Continuous Infusions:  sodium chloride      diltiazem  (CARDIZEM ) infusion Stopped (05/11/24 2128)   PRN Meds:.sodium chloride , acetaminophen  **OR** acetaminophen , ondansetron  **OR** ondansetron  (ZOFRAN ) IV, sodium chloride  flush, traZODone  Family Communication: None at bedside  Disposition: Status is: Inpatient Remains inpatient appropriate because: A-fib RVR     Time spent: 35 minutes  Author: Jodeane Mulligan, DO 05/12/2024 10:58 AM  For on call review [email or web address].

## 2024-05-12 NOTE — Plan of Care (Signed)
  Problem: Pain Managment: Goal: General experience of comfort will improve and/or be controlled Outcome: Progressing   Problem: Safety: Goal: Ability to remain free from injury will improve Outcome: Progressing   Problem: Skin Integrity: Goal: Risk for impaired skin integrity will decrease Outcome: Progressing

## 2024-05-12 NOTE — Progress Notes (Signed)
 Pt has bruising to right wrist from a line yesterday, present since nightshift, no changes. Destiny Bogaert, DO aware. Sunit Tolia, DO paged.

## 2024-05-12 NOTE — Progress Notes (Signed)
 Mobility Specialist Progress Note;   05/12/24 1013  Mobility  Activity Ambulated independently in hallway  Level of Assistance Standby assist, set-up cues, supervision of patient - no hands on  Assistive Device None  Distance Ambulated (ft) 250 ft  Activity Response Tolerated well  Mobility Referral Yes  Mobility visit 1 Mobility  Mobility Specialist Start Time (ACUTE ONLY) 1013  Mobility Specialist Stop Time (ACUTE ONLY) 1031  Mobility Specialist Time Calculation (min) (ACUTE ONLY) 18 min    Pre-mobility: HR 53 bpm During-mobility: HR 68 bpm Post-mobility: HR 58 bpm  Pt eager for mobility. Required no physical assistance during ambulation, SV. Took 1x standing rest break d/t SOB, however VSS on RA. HR noted above during session. Pt returned back to chair with all needs met. Husband in room.   Destiny Brown Mobility Specialist Please contact via SecureChat or [employer] [phone]

## 2024-05-12 NOTE — Progress Notes (Signed)
 Rounding Note    Patient Name: Destiny Brown Date of Encounter: 05/12/2024   [facility] Cardiologist: Richardo Chandler, MD  Chief Complaint: Dizziness and weakness Reason of consult: Abnormal stress test and A-fib with RVR  Subjective   Sitting in chair upright. Hemodynamically stable. Back in sinus bradycardia  Inpatient Medications    Scheduled Meds:  apixaban   5 mg Oral BID   aspirin  EC  81 mg Oral Daily   insulin  aspart  0-15 Units Subcutaneous TID WC   insulin  aspart  0-5 Units Subcutaneous QHS   levothyroxine   100 mcg Oral Q0600   metoprolol  tartrate  25 mg Oral TID   pneumococcal 20-valent conjugate vaccine  0.5 mL Intramuscular Tomorrow-1000   pregabalin  75 mg Oral BID   rosuvastatin  5 mg Oral Once per day on Monday Thursday   sodium chloride  flush  3 mL Intravenous Q12H   Continuous Infusions:  diltiazem  (CARDIZEM ) infusion Stopped (05/11/24 2128)   PRN Meds: acetaminophen  **OR** acetaminophen , ondansetron  **OR** ondansetron  (ZOFRAN ) IV, sodium chloride  flush, traZODone   Vital Signs    Vitals:   05/11/24 2350 05/12/24 0745 05/12/24 0901 05/12/24 1146  BP: (!) 141/56 (!) 156/61  120/61  Pulse:  (!) 55 66 (!) 46  Resp: 18 16  18   Temp: 97.8 F (36.6 C) 97.9 F (36.6 C)  97.8 F (36.6 C)  TempSrc: Oral Oral  Oral  SpO2: 98% 99%  100%  Weight:      Height:        Intake/Output Summary (Last 24 hours) at 05/12/2024 1423 Last data filed at 05/12/2024 1147 Gross per 24 hour  Intake 1362.72 ml  Output --  Net 1362.72 ml      05/11/2024    5:00 AM 05/10/2024    2:24 PM 05/09/2024    9:28 AM  Last 3 Weights  Weight (lbs) 186 lb 8 oz 175 lb 185 lb  Weight (kg) 84.596 kg 79.379 kg 83.915 kg      Telemetry   Sinus bradycardia.- Personally Reviewed  ECG    05/11/2024 sinus bradycardia, 45 bpm, left axis, left bundle branch block- Personally Reviewed  Physical Exam   General: Age appropriate, hemodynamically stable, no acute  distress HEENT: Moist mucous membranes, trachea midline, no JVP Lungs: Clear to auscultation bilaterally no wheezes rales or rhonchi's Heart: Regular, bradycardia no murmurs rubs or gallops  Abdomen: Soft, nontender, distended, positive bowel sounds in all 4 quadrants Extremities: No swelling, warm to touch.  Ecchymosis of the right radial site.  Arteriotomy site is clean and dry and healing well.  2+ right radial pulse, no bruit or hematoma appreciated Neuro: Oriented x 4, no acute distress, nonfocal Psych: Appropriate and cooperative  Labs    High Sensitivity Troponin:  No results for input(s): "TROPONINIHS" in the last 720 hours.   Chemistry Recent Labs  Lab 05/11/24 0542 05/11/24 0832 05/12/24 0821  NA 135 136 136  K 4.3 4.0 4.3  CL 103 104 109  CO2 21* 22 19*  GLUCOSE 132* 138* 127*  BUN 24* 23 24*  CREATININE 1.47* 1.40* 1.26*  CALCIUM  9.8 9.9 9.6  MG 2.2 2.0 1.8  GFRNONAA 36* 38* 43*  ANIONGAP 11 10 8     Lipids No results for input(s): "CHOL", "TRIG", "HDL", "LABVLDL", "LDLCALC", "CHOLHDL" in the last 168 hours.  Hematology Recent Labs  Lab 05/10/24 0650 05/11/24 0542 05/12/24 0821  WBC 5.5 4.4 4.5  RBC 4.44 4.60 4.34  HGB 12.0 12.8 11.9*  HCT 37.3 38.2 36.7  MCV 84.0 83.0 84.6  MCH 27.0 27.8 27.4  MCHC 32.2 33.5 32.4  RDW 12.9 12.8 12.9  PLT 196 185 193   Thyroid   Recent Labs  Lab 05/10/24 0740 05/11/24 0832  TSH <0.010*  --   FREET4  --  1.97*    BNPNo results for input(s): "BNP", "PROBNP" in the last 168 hours.  DDimer No results for input(s): "DDIMER" in the last 168 hours.   Radiology    N/A  Cardiac Studies   PET stress test 05/09/24:     Findings are consistent with ischemia. The study is intermediate risk: Mild anterior ischemia with decrease in LAD stress flows.  LVEF is decreased but augments with stress and may be related to septal motion. Decrease iny myocardial blood flow reserve is partially underestimated due to high resting  flows   LV perfusion is abnormal. There is evidence of ischemia. There is no evidence of infarction. Defect 1: There is a small defect with mild reduction in uptake present in the apical to mid anterior location(s) that is reversible. There is normal wall motion in the defect area. Consistent with ischemia.   Rest left ventricular function is abnormal. Rest global function is moderately reduced. Rest EF: 35%. Stress left ventricular function is abnormal. Stress global function is mildly reduced. Stress EF: 42%. End diastolic cavity size is mildly enlarged. End systolic cavity size is mildly enlarged.   Myocardial blood flow was computed to be 1.58ml/g/min at rest and 1.90ml/g/min at stress. Global myocardial blood flow reserve was 1.58 and was abnormal.   Coronary calcium  assessment not performed due to prior revascularization. Aortic atherosclerosis.  Aortic valve calcification.   Electronically Signed  By: Gloriann Larger M.D.     Cath 05/11/2024   1st RPL lesion is 60% stenosed.   Ost LAD to Prox LAD lesion is 40% stenosed.   Ost LM lesion is 20% stenosed.   Non-stenotic Prox Cx to Mid Cx lesion was previously treated.   1.  Widely patent left circumflex stent with stable moderate proximal LAD stenosis.  No evidence of obstructive disease. 2.  Left ventricular angiography was not performed due to chronic kidney disease.  Mildly elevated left ventricular end-diastolic pressure at 20 mmHg.   Recommendations: Continue medical therapy for coronary artery disease. Continue management of atrial fibrillation. Heparin  drip can be resumed 2 hours after TR band removal and she can be transition back to apixaban  in the evening if no further plans for any other invasive procedures. Monitor renal function.  35 mL of contrast was used for the procedure.    Patient Profile     78 y.o. female with a hx of PAF on OAC since 2015, CAD with recent PCI/stent to LCX 2024, LBBB, DM2, HTN, and HLD who is  being seen  for the evaluation of Afib RVR at the request of Dr. Jannette Mend.   Assessment & Plan    Impression   Recommendations:   Persistent atrial fibrillation Atrial fibrillation with rapid ventricular rate -there is hospitalization Chronic anticoagulation Underwent direct-current cardioversion on 05/09/2024 due to concerns for A-fib with RVR with wide-complex tachycardia that suggests VT Converted to sinus bradycardia yesterday night May 11, 2024 at approximately 2100 EKG independently reviewed. Will decrease Lopressor  to 25 mg p.o. twice daily with holding parameters. Hold if systolic blood pressure (top number) less than 100 mmHg or pulse less than 60 bpm. Started back on oral anticoagulation. 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 Telemetry reviewed  Wide-complex tachycardia - suggestive of ventricular tachycardia (present on arrival) Reviewed EKG with EP on-call 05/09/2024 shared decision was that her symptoms/presentation/EKG concerning for ventricular tachycardia. Due to hemodynamic instability shared decision was obtained and underwent direct-current cardioversion with 200 J x 1.  Patient was restored to normal sinus rhythm. However after heart catheterization was back into A-fib with RVR.  Coronary artery disease with anginal chest pain. Abnormal cardiac PET/CT Has been experiencing anginal chest pain at home which led to outpatient cardiac PET/CT which noted reversible ischemia in the anterior precordium likely LAD distribution. No new obstructive coronary disease on recent left heart catheterization.  Hypertension Blood pressures are better controlled. Will defer blood pressure management to attending physician Monitor BUN and creatinine.  Thyroid  disease: Suggestive of iatrogenic hyperthyroidism. Management per primary team Likely the cause of recurrent A-fib with RVR   For questions or updates, please contact [facility] Please consult [email or web address] for contact info under      Signed, Olinda Bertrand, DO, [facility]   [facility]  05/12/2024

## 2024-05-12 NOTE — Plan of Care (Signed)

## 2024-05-26 ENCOUNTER — Other Ambulatory Visit: Payer: Self-pay | Admitting: Endocrinology

## 2024-05-26 DIAGNOSIS — Z1231 Encounter for screening mammogram for malignant neoplasm of breast: Secondary | ICD-10-CM

## 2024-06-02 ENCOUNTER — Emergency Department (HOSPITAL_COMMUNITY)

## 2024-06-02 ENCOUNTER — Other Ambulatory Visit: Payer: Self-pay

## 2024-06-02 ENCOUNTER — Inpatient Hospital Stay (HOSPITAL_COMMUNITY)
Admission: EM | Admit: 2024-06-02 | Discharge: 2024-06-10 | DRG: 291 | Disposition: A | Discharge status: Home Health | Attending: Internal Medicine | Admitting: Internal Medicine

## 2024-06-02 ENCOUNTER — Ambulatory Visit (HOSPITAL_COMMUNITY)
Admission: RE | Admit: 2024-06-02 | Discharge: 2024-06-02 | Disposition: A | Discharge status: Home / Self Care | Source: Ambulatory Visit | Attending: Physician Assistant | Admitting: Physician Assistant

## 2024-06-02 ENCOUNTER — Encounter (HOSPITAL_COMMUNITY): Payer: Self-pay | Admitting: Radiology

## 2024-06-02 DIAGNOSIS — E1140 Type 2 diabetes mellitus with diabetic neuropathy, unspecified: Secondary | ICD-10-CM | POA: Diagnosis present

## 2024-06-02 DIAGNOSIS — Z7901 Long term (current) use of anticoagulants: Secondary | ICD-10-CM

## 2024-06-02 DIAGNOSIS — I130 Hypertensive heart and chronic kidney disease with heart failure and stage 1 through stage 4 chronic kidney disease, or unspecified chronic kidney disease: Secondary | ICD-10-CM | POA: Diagnosis present

## 2024-06-02 DIAGNOSIS — I4891 Unspecified atrial fibrillation: Secondary | ICD-10-CM | POA: Diagnosis not present

## 2024-06-02 DIAGNOSIS — N184 Chronic kidney disease, stage 4 (severe): Secondary | ICD-10-CM | POA: Diagnosis present

## 2024-06-02 DIAGNOSIS — I2510 Atherosclerotic heart disease of native coronary artery without angina pectoris: Secondary | ICD-10-CM | POA: Diagnosis present

## 2024-06-02 DIAGNOSIS — Z885 Allergy status to narcotic agent status: Secondary | ICD-10-CM

## 2024-06-02 DIAGNOSIS — G8929 Other chronic pain: Secondary | ICD-10-CM | POA: Diagnosis present

## 2024-06-02 DIAGNOSIS — J9811 Atelectasis: Secondary | ICD-10-CM | POA: Diagnosis present

## 2024-06-02 DIAGNOSIS — E039 Hypothyroidism, unspecified: Secondary | ICD-10-CM | POA: Diagnosis present

## 2024-06-02 DIAGNOSIS — Z96611 Presence of right artificial shoulder joint: Secondary | ICD-10-CM | POA: Diagnosis present

## 2024-06-02 DIAGNOSIS — I429 Cardiomyopathy, unspecified: Secondary | ICD-10-CM | POA: Diagnosis present

## 2024-06-02 DIAGNOSIS — Z7982 Long term (current) use of aspirin: Secondary | ICD-10-CM

## 2024-06-02 DIAGNOSIS — I2489 Other forms of acute ischemic heart disease: Secondary | ICD-10-CM | POA: Diagnosis present

## 2024-06-02 DIAGNOSIS — E785 Hyperlipidemia, unspecified: Secondary | ICD-10-CM | POA: Diagnosis present

## 2024-06-02 DIAGNOSIS — E662 Morbid (severe) obesity with alveolar hypoventilation: Secondary | ICD-10-CM | POA: Diagnosis present

## 2024-06-02 DIAGNOSIS — R079 Chest pain, unspecified: Secondary | ICD-10-CM

## 2024-06-02 DIAGNOSIS — E1122 Type 2 diabetes mellitus with diabetic chronic kidney disease: Secondary | ICD-10-CM | POA: Diagnosis present

## 2024-06-02 DIAGNOSIS — Z7984 Long term (current) use of oral hypoglycemic drugs: Secondary | ICD-10-CM

## 2024-06-02 DIAGNOSIS — R7989 Other specified abnormal findings of blood chemistry: Secondary | ICD-10-CM | POA: Diagnosis present

## 2024-06-02 DIAGNOSIS — I959 Hypotension, unspecified: Secondary | ICD-10-CM | POA: Diagnosis not present

## 2024-06-02 DIAGNOSIS — I480 Paroxysmal atrial fibrillation: Secondary | ICD-10-CM | POA: Diagnosis present

## 2024-06-02 DIAGNOSIS — Z90710 Acquired absence of both cervix and uterus: Secondary | ICD-10-CM

## 2024-06-02 DIAGNOSIS — Z79890 Hormone replacement therapy (postmenopausal): Secondary | ICD-10-CM

## 2024-06-02 DIAGNOSIS — Z79899 Other long term (current) drug therapy: Secondary | ICD-10-CM

## 2024-06-02 DIAGNOSIS — Z6831 Body mass index (BMI) 31.0-31.9, adult: Secondary | ICD-10-CM

## 2024-06-02 DIAGNOSIS — E871 Hypo-osmolality and hyponatremia: Secondary | ICD-10-CM | POA: Diagnosis not present

## 2024-06-02 DIAGNOSIS — I447 Left bundle-branch block, unspecified: Secondary | ICD-10-CM | POA: Diagnosis present

## 2024-06-02 DIAGNOSIS — R001 Bradycardia, unspecified: Secondary | ICD-10-CM | POA: Diagnosis not present

## 2024-06-02 DIAGNOSIS — I10 Essential (primary) hypertension: Secondary | ICD-10-CM | POA: Diagnosis not present

## 2024-06-02 DIAGNOSIS — J9601 Acute respiratory failure with hypoxia: Secondary | ICD-10-CM | POA: Diagnosis present

## 2024-06-02 DIAGNOSIS — E079 Disorder of thyroid, unspecified: Secondary | ICD-10-CM | POA: Diagnosis present

## 2024-06-02 DIAGNOSIS — Z955 Presence of coronary angioplasty implant and graft: Secondary | ICD-10-CM

## 2024-06-02 DIAGNOSIS — N179 Acute kidney failure, unspecified: Secondary | ICD-10-CM | POA: Diagnosis not present

## 2024-06-02 DIAGNOSIS — I5021 Acute systolic (congestive) heart failure: Secondary | ICD-10-CM

## 2024-06-02 DIAGNOSIS — R0609 Other forms of dyspnea: Secondary | ICD-10-CM | POA: Diagnosis not present

## 2024-06-02 DIAGNOSIS — I5043 Acute on chronic combined systolic (congestive) and diastolic (congestive) heart failure: Secondary | ICD-10-CM | POA: Diagnosis not present

## 2024-06-02 DIAGNOSIS — I2583 Coronary atherosclerosis due to lipid rich plaque: Secondary | ICD-10-CM | POA: Diagnosis not present

## 2024-06-02 DIAGNOSIS — Z981 Arthrodesis status: Secondary | ICD-10-CM

## 2024-06-02 DIAGNOSIS — R0600 Dyspnea, unspecified: Secondary | ICD-10-CM | POA: Insufficient documentation

## 2024-06-02 DIAGNOSIS — Z96651 Presence of right artificial knee joint: Secondary | ICD-10-CM | POA: Diagnosis present

## 2024-06-02 DIAGNOSIS — E1169 Type 2 diabetes mellitus with other specified complication: Secondary | ICD-10-CM | POA: Diagnosis present

## 2024-06-02 DIAGNOSIS — I509 Heart failure, unspecified: Secondary | ICD-10-CM

## 2024-06-02 DIAGNOSIS — I5020 Unspecified systolic (congestive) heart failure: Secondary | ICD-10-CM | POA: Diagnosis not present

## 2024-06-02 DIAGNOSIS — I5041 Acute combined systolic (congestive) and diastolic (congestive) heart failure: Secondary | ICD-10-CM | POA: Diagnosis not present

## 2024-06-02 DIAGNOSIS — I5023 Acute on chronic systolic (congestive) heart failure: Secondary | ICD-10-CM | POA: Diagnosis present

## 2024-06-02 DIAGNOSIS — Z9049 Acquired absence of other specified parts of digestive tract: Secondary | ICD-10-CM

## 2024-06-02 DIAGNOSIS — E66811 Obesity, class 1: Secondary | ICD-10-CM | POA: Diagnosis not present

## 2024-06-02 LAB — COMPREHENSIVE METABOLIC PANEL WITH GFR
ALT: 16 U/L (ref 0–44)
AST: 23 U/L (ref 15–41)
Albumin: 3.7 g/dL (ref 3.5–5.0)
Alkaline Phosphatase: 73 U/L (ref 38–126)
Anion gap: 13 (ref 5–15)
BUN: 19 mg/dL (ref 8–23)
CO2: 19 mmol/L — ABNORMAL LOW (ref 22–32)
Calcium: 9.1 mg/dL (ref 8.9–10.3)
Chloride: 106 mmol/L (ref 98–111)
Creatinine, Ser: 1.29 mg/dL — ABNORMAL HIGH (ref 0.44–1.00)
GFR, Estimated: 42 mL/min — ABNORMAL LOW (ref >60)
Glucose, Bld: 271 mg/dL — ABNORMAL HIGH (ref 70–99)
Potassium: 3.6 mmol/L (ref 3.5–5.1)
Sodium: 138 mmol/L (ref 135–145)
Total Bilirubin: 1 mg/dL (ref 0.0–1.2)
Total Protein: 6.5 g/dL (ref 6.5–8.1)

## 2024-06-02 LAB — CBC WITH DIFFERENTIAL/PLATELET
Abs Immature Granulocytes: 0.04 10*3/uL (ref 0.00–0.07)
Basophils Absolute: 0 10*3/uL (ref 0.0–0.1)
Basophils Relative: 0 %
Eosinophils Absolute: 0.1 10*3/uL (ref 0.0–0.5)
Eosinophils Relative: 1 %
HCT: 38.4 % (ref 36.0–46.0)
Hemoglobin: 12.1 g/dL (ref 12.0–15.0)
Immature Granulocytes: 0 %
Lymphocytes Relative: 17 %
Lymphs Abs: 1.7 10*3/uL (ref 0.7–4.0)
MCH: 27.4 pg (ref 26.0–34.0)
MCHC: 31.5 g/dL (ref 30.0–36.0)
MCV: 87.1 fL (ref 80.0–100.0)
Monocytes Absolute: 0.4 10*3/uL (ref 0.1–1.0)
Monocytes Relative: 4 %
Neutro Abs: 7.7 10*3/uL (ref 1.7–7.7)
Neutrophils Relative %: 78 %
Platelets: 202 10*3/uL (ref 150–400)
RBC: 4.41 MIL/uL (ref 3.87–5.11)
RDW: 14.4 % (ref 11.5–15.5)
WBC: 9.9 10*3/uL (ref 4.0–10.5)
nRBC: 0 % (ref 0.0–0.2)

## 2024-06-02 LAB — MAGNESIUM: Magnesium: 2 mg/dL (ref 1.7–2.4)

## 2024-06-02 LAB — I-STAT VENOUS BLOOD GAS, ED
Acid-base deficit: 6 mmol/L — ABNORMAL HIGH (ref 0.0–2.0)
Bicarbonate: 20.6 mmol/L (ref 20.0–28.0)
Calcium, Ion: 1.18 mmol/L (ref 1.15–1.40)
HCT: 36 % (ref 36.0–46.0)
Hemoglobin: 12.2 g/dL (ref 12.0–15.0)
O2 Saturation: 83 %
Potassium: 3.4 mmol/L — ABNORMAL LOW (ref 3.5–5.1)
Sodium: 140 mmol/L (ref 135–145)
TCO2: 22 mmol/L (ref 22–32)
pCO2, Ven: 44.8 mmHg (ref 44–60)
pH, Ven: 7.271 (ref 7.25–7.43)
pO2, Ven: 54 mmHg — ABNORMAL HIGH (ref 32–45)

## 2024-06-02 LAB — ECHOCARDIOGRAM COMPLETE
Area-P 1/2: 3.03 cm2
S' Lateral: 4 cm

## 2024-06-02 LAB — TROPONIN I (HIGH SENSITIVITY): Troponin I (High Sensitivity): 79 ng/L — ABNORMAL HIGH (ref <18)

## 2024-06-02 LAB — BRAIN NATRIURETIC PEPTIDE: B Natriuretic Peptide: 1135.6 pg/mL — ABNORMAL HIGH (ref 0.0–100.0)

## 2024-06-02 MED ORDER — FUROSEMIDE 10 MG/ML IJ SOLN
40.0000 mg | Freq: Once | INTRAMUSCULAR | Status: AC
  Administered 2024-06-02: 40 mg via INTRAVENOUS
  Filled 2024-06-02: qty 4

## 2024-06-02 NOTE — Progress Notes (Signed)
 Reached out to Dr. Julane Ny, echo reader and [address] DOD,Dr. Lorie Rook regarding echocardiogram results.

## 2024-06-02 NOTE — ED Provider Notes (Signed)
 [facility] [facility] EMERGENCY DEPARTMENT AT  HOSPITAL Provider Note  CSN: [id number] Arrival date & time: 06/02/24 1924  Chief Complaint(s) Shortness of Breath  HPI Destiny Brown is a 78 y.o. female history of diabetes, hypertension, A-fib with RVR, coronary artery disease presenting with shortness of breath.  Patient reports she was recently hospitalized for atrial fibrillation.  She had an outpatient echocardiogram today which showed CHF.  Does not take diuretics.  Reports that has been having worsening shortness of breath today, some leg swelling, orthopnea.  Shortness of breath worse with exertion.  No chest pain.  Reports cough without phlegm.  Denies any similar episode previously.  EMS found patient was hypoxic in the 80s.  Does not typically use oxygen.   Past Medical History Past Medical History:  Diagnosis Date   Arthritis    Atrial fibrillation with RVR (HCC) 09/21/2014   Bronchitis    h/o   Diabetes mellitus    NO MEDS,  DIET CONTROLLED   History of migraines    "when working under alot of stress"   Hypertension    Hypothyroid    LBBB (left bundle branch block) 09/22/2014   OSA (obstructive sleep apnea) 04/09/2015   does not use cpap   PONV (postoperative nausea and vomiting)    PT DOESNOT WANT TO TAKE HER ATENOLOL      DOS.   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   Persistent atrial fibrillation (HCC) 05/10/2024   Thyroid  disease 05/10/2024   Atrial fibrillation status post cardioversion (HCC) 05/10/2024   Atrial fibrillation with rapid ventricular response (HCC) 05/09/2024   Ventricular tachycardia (HCC) 05/09/2024   Long term (current) use of anticoagulants 05/09/2024   Benign hypertension 05/09/2024   Abnormal findings diagnostic  imaging of heart and coronary circulation 05/09/2024   Atherosclerotic heart disease 05/09/2024   Wide-complex tachycardia 05/09/2024   Abnormal CT scan, heart 04/23/2023   CAD (coronary artery disease) 04/20/2023   Preoperative cardiovascular examination 04/20/2023   Chronic cough 02/19/2023   Palpitations 02/02/2023   Paroxysmal A-fib (HCC) 08/10/2019   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   Diplopia 01/20/2016   Obesity (BMI 30-39.9) 06/26/2015   OSA (obstructive sleep apnea) 04/09/2015   LBBB (left bundle branch block) 09/22/2014   Elevated troponin 09/22/2014   Atrial fibrillation with RVR (HCC) 09/21/2014   Diabetes type 2, controlled (HCC) 09/21/2014   Migraine headache 09/21/2014   Hypothyroid    Hypertension    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 Authorizing Provider  albuterol  (VENTOLIN  HFA) 108 (90 Base) MCG/ACT inhaler Inhale 2 puffs into the lungs every 6 (six) hours as needed for wheezing or shortness of breath (Cough). 11/21/22   Eloise Hake Scales, PA-C  apixaban  (ELIQUIS ) 5 MG TABS tablet Take 1 tablet (5 mg total) by mouth 2 (two) times daily. 05/12/24   Jodeane Mulligan, DO  aspirin  EC 81 MG tablet Take 81 mg by mouth daily. 09/29/23   [provider]  Cholecalciferol  (VITAMIN D3) 1000 UNITS CAPS Take 1,000 Units by mouth in the morning.    [provider]  cyanocobalamin (FINEST NUTRITION VITAMIN B-12) 500 MCG tablet  01/03/24   [provider]  cyanocobalamin (VITAMIN B12) 500 MCG tablet Take 500 mcg by mouth daily.    [provider]  latanoprost  (XALATAN ) 0.005 % ophthalmic solution Place 1 drop into both eyes daily at 12 noon. 04/11/23   [provider]  levothyroxine  (SYNTHROID ) 100 MCG tablet Take 1 tablet (100 mcg total) by mouth daily at 6 (six) AM. 05/13/24   Macarthur Savory, Rudean Corrente, DO  metFORMIN  (GLUCOPHAGE )  1000 MG tablet Take 1,000 mg by mouth 2 (two) times daily.    [provider]  metoprolol  tartrate (LOPRESSOR ) 25 MG tablet Take 1 tablet (25 mg total) by mouth 2 (two) times daily. Hold mediation if SBP < 100 or if heart rate < 60. 05/12/24   Jodeane Mulligan, DO  pregabalin  (LYRICA ) 75 MG capsule Take 75 mg by mouth 2 (two) times daily.    [provider]  rosuvastatin  (CRESTOR ) 5 MG tablet Take 5 mg by mouth 2 (two) times a week. Tuesdays & Thursdays.    [provider]                                                                                                                                    Past Surgical History Past Surgical History:  Procedure Laterality Date   ABDOMINAL HYSTERECTOMY  03/2001   APPENDECTOMY     BACK SURGERY     CATARACT EXTRACTION, BILATERAL     WITH IMPLANTS   CERVICAL FUSION  08/2003   CHOLECYSTECTOMY  03/2010   COLONOSCOPY WITH PROPOFOL  N/A 08/27/2023   Procedure: COLONOSCOPY WITH PROPOFOL ;  Surgeon: Alvis Jourdain, MD;  Location: [facility] ENDOSCOPY;  Service: Gastroenterology;  Laterality: N/A;   CORONARY STENT INTERVENTION N/A 04/23/2023   Procedure: CORONARY STENT INTERVENTION;  Surgeon: Arnoldo Lapping, MD;  Location: [facility] INVASIVE CV LAB;  Service: Cardiovascular;  Laterality: N/A;   ESOPHAGOGASTRODUODENOSCOPY N/A 08/27/2023   Procedure: ESOPHAGOGASTRODUODENOSCOPY (EGD);  Surgeon: Alvis Jourdain, MD;  Location: [facility];  Service: Gastroenterology;  Laterality: N/A;   EYE SURGERY     GIVENS CAPSULE STUDY N/A 11/18/2023   Procedure: GIVENS CAPSULE STUDY;  Surgeon: Alvis Jourdain, MD;  Location: [facility] ENDOSCOPY;  Service: Gastroenterology;  Laterality: N/A;   LEFT HEART CATH AND CORONARY ANGIOGRAPHY N/A 04/23/2023   Procedure: LEFT HEART CATH AND CORONARY ANGIOGRAPHY;  Surgeon: Arnoldo Lapping, MD;  Location: [facility] INVASIVE CV LAB;  Service: Cardiovascular;  Laterality: N/A;   LEFT HEART CATH AND CORONARY ANGIOGRAPHY N/A 05/11/2024   Procedure: LEFT  HEART CATH AND CORONARY ANGIOGRAPHY;  Surgeon: Wenona Hamilton, MD;  Location: [facility] INVASIVE CV LAB;  Service: Cardiovascular;  Laterality: N/A;   LUMBAR DISC SURGERY     LUMBAR LAMINECTOMY/DECOMPRESSION MICRODISCECTOMY  10/2010   POSTERIOR FUSION LUMBAR SPINE  12/16/11; 12/2005; 07/2004   L2-3; L3-4; L4-5   REVERSE SHOULDER ARTHROPLASTY Right 02/26/2022   Procedure: REVERSE SHOULDER ARTHROPLASTY;  Surgeon: Sammye Cristal, MD;  Location: [facility] ORS;  Service: Orthopedics;  Laterality:  Right;   TOTAL KNEE ARTHROPLASTY Right 03/18/2017   Procedure: RIGHT TOTAL KNEE ARTHROPLASTY;  Surgeon: Genevie Kerns, MD;  Location: [facility] ORS;  Service: Orthopedics;  Laterality: Right;   Family History Family History  Adopted: Yes  Family history unknown: Yes    Social History Social History   Tobacco Use   Smoking status: Never   Smokeless tobacco: Never  Vaping Use   Vaping status: Never Used  Substance Use Topics   Alcohol use: No   Drug use: No   Allergies Oxycodone and Codeine  Review of Systems Review of Systems  All other systems reviewed and are negative.   Physical Exam Vital Signs  I have reviewed the triage vital signs BP (!) 168/85 (BP Location: Right Arm)   Pulse (!) 102   Temp 98.2 F (36.8 C) (Oral)   Resp (!) 23   Ht 5\' 5"  (1.651 m)   Wt 83 kg   SpO2 100%   BMI 30.45 kg/m  Physical Exam Vitals and nursing note reviewed.  Constitutional:      General: She is in acute distress.     Appearance: She is well-developed.  HENT:     Head: Normocephalic and atraumatic.     Mouth/Throat:     Mouth: Mucous membranes are moist.  Eyes:     Pupils: Pupils are equal, round, and reactive to light.  Neck:     Vascular: Hepatojugular reflux and JVD present.  Cardiovascular:     Rate and Rhythm: Normal rate and regular rhythm.     Heart sounds: No murmur heard. Pulmonary:     Effort: Tachypnea, accessory muscle usage and respiratory distress present.     Breath sounds:  Examination of the right-upper field reveals wheezing. Examination of the left-upper field reveals wheezing. Examination of the right-middle field reveals wheezing and rales. Examination of the left-middle field reveals wheezing and rales. Examination of the right-lower field reveals rales. Examination of the left-lower field reveals rales. Wheezing and rales present.  Abdominal:     General: Abdomen is flat.     Palpations: Abdomen is soft.     Tenderness: There is no abdominal tenderness.  Musculoskeletal:        General: No tenderness.     Right lower leg: Edema present.     Left lower leg: Edema present.  Skin:    General: Skin is warm and dry.  Neurological:     General: No focal deficit present.     Mental Status: She is alert. Mental status is at baseline.  Psychiatric:        Mood and Affect: Mood normal.        Behavior: Behavior normal.     ED Results and Treatments Labs (all labs ordered are listed, but only abnormal results are displayed) Labs Reviewed  COMPREHENSIVE METABOLIC PANEL WITH GFR - Abnormal; Notable for the following components:      Result Value   CO2 19 (*)    Glucose, Bld 271 (*)    Creatinine, Ser 1.29 (*)    GFR, Estimated 42 (*)    All other components within normal limits  BRAIN NATRIURETIC PEPTIDE - Abnormal; Notable for the following components:   B Natriuretic Peptide 1,135.6 (*)    All other components within normal limits  I-STAT VENOUS BLOOD GAS, ED - Abnormal; Notable for the following components:   pO2, Ven 54 (*)    Acid-base deficit 6.0 (*)    Potassium 3.4 (*)  All other components within normal limits  TROPONIN I (HIGH SENSITIVITY) - Abnormal; Notable for the following components:   Troponin I (High Sensitivity) 79 (*)    All other components within normal limits  CBC WITH DIFFERENTIAL/PLATELET  MAGNESIUM   TROPONIN I (HIGH SENSITIVITY)                                                                                                                           Radiology DG Chest Portable 1 View Result Date: 06/02/2024 CLINICAL DATA:  Shortness of breath. EXAM: PORTABLE CHEST 1 VIEW COMPARISON:  Chest radiograph dated 05/09/2024. FINDINGS: There is mild cardiomegaly with vascular congestion and edema. Bibasilar densities may represent atelectasis or pneumonia. Small bilateral pleural effusions suspected. No pneumothorax. Right shoulder arthroplasty. No acute osseous pathology. IMPRESSION: 1. Cardiomegaly with vascular congestion and edema. 2. Bibasilar atelectasis or pneumonia. Electronically Signed   By: Angus Bark M.D.   On: 06/02/2024 19:53   ECHOCARDIOGRAM COMPLETE Result Date: 06/02/2024    ECHOCARDIOGRAM REPORT   Patient Name:   LAMICA MCCART Date of Exam: 06/02/2024 Medical Rec #:  [id number]        Height:       65.0 in Accession #:    [id number]       Weight:       186.5 lb Date of Birth:  May 07, 1946        BSA:          1.920 m Patient Age:    78 years         BP:           186/109 mmHg Patient Gender: F                HR:           58 bpm. Exam Location:  [address] Procedure: 2D Echo, Cardiac Doppler and Color Doppler (Both Spectral and Color            Flow Doppler were utilized during procedure). Indications:    R06.00 Dyspnea  History:        Patient has prior history of Echocardiogram examinations, most                 recent 02/24/2023. CAD, s/p LCX DES, Arrythmias:LBBB,                 Signs/Symptoms:Shortness of Breath, Dyspnea and Edema; Risk                 Factors:Hypertension and Obesity. Previouse echo LVEF 60% mild                 LAE.  Sonographer:    Donnita Gales [facility], RDCS Referring Phys: [id number] RENEE LYNN URSUY  Sonographer Comments: Notified DOD Dr. Julane Ny IMPRESSIONS  1. There is an L wave in the mitral inflow pattern suggesting significant diastolic dysfunction and increased filling pressures. Left ventricular ejection fraction, by estimation, is 25 to 30%. The  left ventricle has severely decreased  function. The left ventricle demonstrates global hypokinesis. There is mild concentric left ventricular hypertrophy. Left ventricular diastolic parameters are consistent with Grade II diastolic dysfunction (pseudonormalization).  2. Right ventricular systolic function is mildly reduced. The right ventricular size is normal. There is mildly elevated pulmonary artery systolic pressure. The estimated right ventricular systolic pressure is 36.9 mmHg.  3. Left atrial size was moderately dilated.  4. The mitral valve is normal in structure. Mild mitral valve regurgitation. No evidence of mitral stenosis.  5. The aortic valve is tricuspid. There is mild calcification of the aortic valve. Aortic valve regurgitation is not visualized. Aortic valve sclerosis/calcification is present, without any evidence of aortic stenosis. Conclusion(s)/Recommendation(s): Severe global LV dysfunction new since previous. Doppler parameters suggestive of high left-side filling pressures. FINDINGS  Left Ventricle: There is an L wave in the mitral inflow pattern suggesting significant diastolic dysfunction and increased filling pressures. Left ventricular ejection fraction, by estimation, is 25 to 30%. The left ventricle has severely decreased function. The left ventricle demonstrates global hypokinesis. The left ventricular internal cavity size was normal in size. There is mild concentric left ventricular hypertrophy. Left ventricular diastolic parameters are consistent with Grade II diastolic dysfunction (pseudonormalization). Right Ventricle: The right ventricular size is normal. No increase in right ventricular wall thickness. Right ventricular systolic function is mildly reduced. There is mildly elevated pulmonary artery systolic pressure. The tricuspid regurgitant velocity  is 2.69 m/s, and with an assumed right atrial pressure of 8 mmHg, the estimated right ventricular systolic pressure is 36.9 mmHg. Left Atrium: Left atrial size was  moderately dilated. Right Atrium: Right atrial size was normal in size. Pericardium: There is no evidence of pericardial effusion. Mitral Valve: The mitral valve is normal in structure. Mild mitral valve regurgitation. No evidence of mitral valve stenosis. Tricuspid Valve: The tricuspid valve is normal in structure. Tricuspid valve regurgitation is trivial. No evidence of tricuspid stenosis. Aortic Valve: The aortic valve is tricuspid. There is mild calcification of the aortic valve. Aortic valve regurgitation is not visualized. Aortic valve sclerosis/calcification is present, without any evidence of aortic stenosis. Pulmonic Valve: The pulmonic valve was normal in structure. Pulmonic valve regurgitation is not visualized. No evidence of pulmonic stenosis. Aorta: The aortic root is normal in size and structure. IAS/Shunts: No atrial level shunt detected by color flow Doppler.  LEFT VENTRICLE PLAX 2D LVIDd:         5.00 cm   Diastology LVIDs:         4.00 cm   LV e' medial:    3.92 cm/s LV PW:         0.80 cm   LV E/e' medial:  23.1 LV IVS:        1.20 cm   LV e' lateral:   4.57 cm/s LVOT diam:     2.00 cm   LV E/e' lateral: 19.8 LV SV:         63 LV SV Index:   33 LVOT Area:     3.14 cm  RIGHT VENTRICLE            IVC RV Basal diam:  3.20 cm    IVC diam: 2.50 cm RV Mid diam:    2.10 cm RV S prime:     9.14 cm/s TAPSE (M-mode): 2.1 cm RVSP:           36.9 mmHg LEFT ATRIUM             Index  RIGHT ATRIUM           Index LA diam:        4.90 cm 2.55 cm/m   RA Pressure: 8.00 mmHg LA Vol (A2C):   65.9 ml 34.32 ml/m  RA Area:     10.10 cm LA Vol (A4C):   35.6 ml 18.54 ml/m  RA Volume:   20.80 ml  10.83 ml/m LA Biplane Vol: 48.6 ml 25.31 ml/m  AORTIC VALVE LVOT Vmax:   79.70 cm/s LVOT Vmean:  51.900 cm/s LVOT VTI:    0.200 m  AORTA Ao Root diam: 2.70 cm Ao Asc diam:  2.70 cm MITRAL VALVE               TRICUSPID VALVE MV Area (PHT): 3.03 cm    TR Peak grad:   28.9 mmHg MV Decel Time: 250 msec    TR Vmax:         269.00 cm/s MV E velocity: 90.70 cm/s  Estimated RAP:  8.00 mmHg MV A velocity: 73.40 cm/s  RVSP:           36.9 mmHg MV E/A ratio:  1.24                            SHUNTS                            Systemic VTI:  0.20 m                            Systemic Diam: 2.00 cm Jules Oar MD Electronically signed by Jules Oar MD Signature Date/Time: 06/02/2024/10:09:39 AM    Final     Pertinent labs & imaging results that were available during my care of the patient were reviewed by me and considered in my medical decision making (see MDM for details).  Medications Ordered in ED Medications  furosemide  (LASIX ) injection 40 mg (40 mg Intravenous Given 06/02/24 1951)                                                                                                                                     Procedures .Critical Care  Performed by: Mordecai Applebaum, MD Authorized by: Mordecai Applebaum, MD   Critical care provider statement:    Critical care time (minutes):  30   Critical care was necessary to treat or prevent imminent or life-threatening deterioration of the following conditions:  Respiratory failure   Critical care was time spent personally by me on the following activities:  Development of treatment plan with patient or surrogate, discussions with consultants, evaluation of patient's response to treatment, examination of patient, ordering and review of laboratory studies, ordering and review of radiographic studies, ordering and performing treatments and interventions, pulse oximetry, re-evaluation of patient's condition  and review of old charts   Care discussed with: admitting provider     (including critical care time)  Medical Decision Making / ED Course   MDM:  78 year old presenting to the emerged from for shortness of breath.  Patient ill-appearing with mild respiratory distress, tachypnea.  Pulmonary exam with crackles at the base and wheezing.  Presentation most  consistent with CHF.  She does have some wheezing but no history of asthma or COPD and does not smoke.  May be changed a little bit with the breathing treatment with paramedics.  Chest x-ray shows pulmonary edema.  She had an echocardiogram today which shows new onset CHF.  She is not on diuretics.  Will give dose of Lasix .  She will need admission given hypoxia.  Will check labs including troponin although no chest pain and patient had recent cath.  Will check BNP although clinically patient clearly in CHF exacerbation.  Will check metabolic panel as well to evaluate for AKI or electrolyte derangement.    Clinical Course as of 06/02/24 2237  Fri Jun 02, 2024  2136 Discussed with cardiology Dr. Ossie Blend who will see patient. Discussed with hospitalist Dr. Michell Ahumada who will admit patient. She is feeling better.  [WS]    Clinical Course User Index [WS] Mordecai Applebaum, MD     Additional history obtained: -Additional history obtained from family and ems -External records from outside source obtained and reviewed including: Chart review including previous notes, labs, imaging, consultation notes including prior notes    Lab Tests: -I ordered, reviewed, and interpreted labs.   The pertinent results include:   Labs Reviewed  COMPREHENSIVE METABOLIC PANEL WITH GFR - Abnormal; Notable for the following components:      Result Value   CO2 19 (*)    Glucose, Bld 271 (*)    Creatinine, Ser 1.29 (*)    GFR, Estimated 42 (*)    All other components within normal limits  BRAIN NATRIURETIC PEPTIDE - Abnormal; Notable for the following components:   B Natriuretic Peptide 1,135.6 (*)    All other components within normal limits  I-STAT VENOUS BLOOD GAS, ED - Abnormal; Notable for the following components:   pO2, Ven 54 (*)    Acid-base deficit 6.0 (*)    Potassium 3.4 (*)    All other components within normal limits  TROPONIN I (HIGH SENSITIVITY) - Abnormal; Notable for the following components:    Troponin I (High Sensitivity) 79 (*)    All other components within normal limits  CBC WITH DIFFERENTIAL/PLATELET  MAGNESIUM   TROPONIN I (HIGH SENSITIVITY)    Notable for elevation in BNP and troponin  EKG  LBBB  Imaging Studies ordered: I ordered imaging studies including CXR On my interpretation imaging demonstrates pulmonary edema  I independently visualized and interpreted imaging. I agree with the radiologist interpretation   Medicines ordered and prescription drug management: Meds ordered this encounter  Medications   furosemide  (LASIX ) injection 40 mg    -I have reviewed the patients home medicines and have made adjustments as needed   Cardiac Monitoring: The patient was maintained on a cardiac monitor.  I personally viewed and interpreted the cardiac monitored which showed an underlying rhythm of: NSR  Social Determinants of Health:  Diagnosis or treatment significantly limited by social determinants of health: obesity   Reevaluation: After the interventions noted above, I reevaluated the patient and found that their symptoms have improved  Co morbidities that complicate the patient evaluation  Past Medical  History:  Diagnosis Date   Arthritis    Atrial fibrillation with RVR (HCC) 09/21/2014   Bronchitis    h/o   Diabetes mellitus    NO MEDS,  DIET CONTROLLED   History of migraines    "when working under alot of stress"   Hypertension    Hypothyroid    LBBB (left bundle branch block) 09/22/2014   OSA (obstructive sleep apnea) 04/09/2015   does not use cpap   PONV (postoperative nausea and vomiting)    PT DOESNOT WANT TO TAKE HER ATENOLOL      DOS.   " IT DROPS MY BP TO LOW"      Dispostion: Disposition decision including need for hospitalization was considered, and patient admitted to the hospital.    Final Clinical Impression(s) / ED Diagnoses Final diagnoses:  Acute hypoxic respiratory failure (HCC)  Acute systolic CHF (congestive heart  failure) (HCC)     This chart was dictated using voice recognition software.  Despite best efforts to proofread,  errors can occur which can change the documentation meaning.    Mordecai Applebaum, MD 06/02/24 2237

## 2024-06-02 NOTE — ED Triage Notes (Signed)
 Pt presents to the ED from home with c/o SOA since noon. Pt was seen earlier today at PCP and dx with CHF, decreased EF. Wanted to go home after appointment and started having trouble breathing after. Pt has audible wheezing. Pt Aox4. On duoned upon arrival, 100% o2 sat. No edema noted.

## 2024-06-03 ENCOUNTER — Encounter (HOSPITAL_COMMUNITY): Payer: Self-pay | Admitting: Internal Medicine

## 2024-06-03 DIAGNOSIS — I509 Heart failure, unspecified: Secondary | ICD-10-CM

## 2024-06-03 DIAGNOSIS — I5020 Unspecified systolic (congestive) heart failure: Secondary | ICD-10-CM

## 2024-06-03 DIAGNOSIS — I480 Paroxysmal atrial fibrillation: Secondary | ICD-10-CM

## 2024-06-03 DIAGNOSIS — J9601 Acute respiratory failure with hypoxia: Principal | ICD-10-CM

## 2024-06-03 LAB — GLUCOSE, CAPILLARY
Glucose-Capillary: 107 mg/dL — ABNORMAL HIGH (ref 70–99)
Glucose-Capillary: 173 mg/dL — ABNORMAL HIGH (ref 70–99)

## 2024-06-03 LAB — TROPONIN I (HIGH SENSITIVITY)
Troponin I (High Sensitivity): 211 ng/L (ref <18)
Troponin I (High Sensitivity): 292 ng/L (ref <18)
Troponin I (High Sensitivity): 272 ng/L (ref <18)

## 2024-06-03 LAB — BASIC METABOLIC PANEL WITH GFR
Anion gap: 16 — ABNORMAL HIGH (ref 5–15)
BUN: 19 mg/dL (ref 8–23)
CO2: 17 mmol/L — ABNORMAL LOW (ref 22–32)
Calcium: 9.3 mg/dL (ref 8.9–10.3)
Chloride: 105 mmol/L (ref 98–111)
Creatinine, Ser: 1.44 mg/dL — ABNORMAL HIGH (ref 0.44–1.00)
GFR, Estimated: 37 mL/min — ABNORMAL LOW (ref >60)
Glucose, Bld: 298 mg/dL — ABNORMAL HIGH (ref 70–99)
Potassium: 3.4 mmol/L — ABNORMAL LOW (ref 3.5–5.1)
Sodium: 138 mmol/L (ref 135–145)

## 2024-06-03 LAB — PROCALCITONIN: Procalcitonin: 0.25 ng/mL

## 2024-06-03 LAB — HEPARIN LEVEL (UNFRACTIONATED): Heparin Unfractionated: >1.1 [IU]/mL — ABNORMAL HIGH (ref 0.30–0.70)

## 2024-06-03 LAB — APTT: aPTT: 65 s — ABNORMAL HIGH (ref 24–36)

## 2024-06-03 LAB — MAGNESIUM: Magnesium: 1.6 mg/dL — ABNORMAL LOW (ref 1.7–2.4)

## 2024-06-03 MED ORDER — AMIODARONE HCL IN DEXTROSE 360-4.14 MG/200ML-% IV SOLN
60.0000 mg/h | INTRAVENOUS | Status: DC
  Administered 2024-06-03: 60 mg/h via INTRAVENOUS

## 2024-06-03 MED ORDER — LEVOTHYROXINE SODIUM 100 MCG PO TABS
100.0000 ug | ORAL_TABLET | Freq: Every day | ORAL | Status: DC
  Administered 2024-06-03 – 2024-06-10 (×8): 100 ug via ORAL
  Filled 2024-06-03 (×8): qty 1

## 2024-06-03 MED ORDER — POTASSIUM CHLORIDE CRYS ER 20 MEQ PO TBCR
40.0000 meq | EXTENDED_RELEASE_TABLET | Freq: Two times a day (BID) | ORAL | Status: AC
  Administered 2024-06-03 (×2): 40 meq via ORAL
  Filled 2024-06-03 (×2): qty 2

## 2024-06-03 MED ORDER — PNEUMOCOCCAL 20-VAL CONJ VACC 0.5 ML IM SUSY
0.5000 mL | PREFILLED_SYRINGE | INTRAMUSCULAR | Status: DC
  Filled 2024-06-03: qty 0.5

## 2024-06-03 MED ORDER — INSULIN ASPART 100 UNIT/ML IJ SOLN
0.0000 [IU] | Freq: Every day | INTRAMUSCULAR | Status: DC
  Administered 2024-06-03: 3 [IU] via SUBCUTANEOUS
  Administered 2024-06-07: 2 [IU] via SUBCUTANEOUS

## 2024-06-03 MED ORDER — FUROSEMIDE 10 MG/ML IJ SOLN
40.0000 mg | Freq: Two times a day (BID) | INTRAMUSCULAR | Status: DC
  Administered 2024-06-03 – 2024-06-04 (×4): 40 mg via INTRAVENOUS
  Filled 2024-06-03 (×5): qty 4

## 2024-06-03 MED ORDER — ACETAMINOPHEN 650 MG RE SUPP
650.0000 mg | Freq: Four times a day (QID) | RECTAL | Status: DC | PRN
Start: 2024-06-03 — End: 2024-06-10

## 2024-06-03 MED ORDER — ACETAMINOPHEN 325 MG PO TABS
650.0000 mg | ORAL_TABLET | Freq: Four times a day (QID) | ORAL | Status: DC | PRN
  Administered 2024-06-07: 650 mg via ORAL
  Filled 2024-06-03: qty 2

## 2024-06-03 MED ORDER — HEPARIN BOLUS VIA INFUSION
1000.0000 [IU] | Freq: Once | INTRAVENOUS | Status: AC
  Administered 2024-06-03: 1000 [IU] via INTRAVENOUS
  Filled 2024-06-03: qty 1000

## 2024-06-03 MED ORDER — AMIODARONE IV BOLUS ONLY 150 MG/100ML
INTRAVENOUS | Status: AC
  Filled 2024-06-03: qty 100

## 2024-06-03 MED ORDER — METOPROLOL TARTRATE 25 MG PO TABS
25.0000 mg | ORAL_TABLET | Freq: Two times a day (BID) | ORAL | Status: DC
  Administered 2024-06-03 (×2): 25 mg via ORAL
  Filled 2024-06-03 (×2): qty 1

## 2024-06-03 MED ORDER — APIXABAN 5 MG PO TABS: 5.0000 mg | ORAL_TABLET | Freq: Two times a day (BID) | ORAL | Status: DC

## 2024-06-03 MED ORDER — ROSUVASTATIN CALCIUM 5 MG PO TABS
5.0000 mg | ORAL_TABLET | ORAL | Status: DC
  Administered 2024-06-05 – 2024-06-08 (×2): 5 mg via ORAL
  Filled 2024-06-03 (×2): qty 1

## 2024-06-03 MED ORDER — AMIODARONE HCL IN DEXTROSE 360-4.14 MG/200ML-% IV SOLN
INTRAVENOUS | Status: AC
  Administered 2024-06-03: 150 mg via INTRAVENOUS
  Filled 2024-06-03: qty 200

## 2024-06-03 MED ORDER — POTASSIUM CHLORIDE 10 MEQ/100ML IV SOLN
10.0000 meq | Freq: Once | INTRAVENOUS | Status: AC
  Administered 2024-06-03: 10 meq via INTRAVENOUS
  Filled 2024-06-03: qty 100

## 2024-06-03 MED ORDER — MAGNESIUM SULFATE 2 GM/50ML IV SOLN: 2.0000 g | Freq: Once | INTRAVENOUS | Status: AC

## 2024-06-03 MED ORDER — INSULIN ASPART 100 UNIT/ML IJ SOLN
0.0000 [IU] | Freq: Three times a day (TID) | INTRAMUSCULAR | Status: DC
  Administered 2024-06-03: 3 [IU] via SUBCUTANEOUS
  Administered 2024-06-03 – 2024-06-04 (×2): 2 [IU] via SUBCUTANEOUS
  Administered 2024-06-04: 1 [IU] via SUBCUTANEOUS
  Administered 2024-06-04: 2 [IU] via SUBCUTANEOUS
  Administered 2024-06-05: 1 [IU] via SUBCUTANEOUS
  Administered 2024-06-05 – 2024-06-06 (×5): 2 [IU] via SUBCUTANEOUS
  Administered 2024-06-07: 1 [IU] via SUBCUTANEOUS
  Administered 2024-06-07 – 2024-06-08 (×4): 2 [IU] via SUBCUTANEOUS
  Administered 2024-06-08: 1 [IU] via SUBCUTANEOUS
  Administered 2024-06-09 – 2024-06-10 (×2): 2 [IU] via SUBCUTANEOUS
  Administered 2024-06-10: 1 [IU] via SUBCUTANEOUS

## 2024-06-03 MED ORDER — MAGNESIUM SULFATE 2 GM/50ML IV SOLN
INTRAVENOUS | Status: AC
  Administered 2024-06-03: 2 g via INTRAVENOUS
  Filled 2024-06-03: qty 50

## 2024-06-03 MED ORDER — AMIODARONE HCL IN DEXTROSE 360-4.14 MG/200ML-% IV SOLN
30.0000 mg/h | INTRAVENOUS | Status: DC
  Administered 2024-06-03 (×2): 30 mg/h via INTRAVENOUS
  Filled 2024-06-03 (×2): qty 200

## 2024-06-03 MED ORDER — LATANOPROST 0.005 % OP SOLN
1.0000 [drp] | Freq: Every day | OPHTHALMIC | Status: DC
  Administered 2024-06-03 – 2024-06-09 (×5): 1 [drp] via OPHTHALMIC
  Filled 2024-06-03 (×2): qty 2.5

## 2024-06-03 MED ORDER — HEPARIN (PORCINE) 25000 UT/250ML-% IV SOLN
1300.0000 [IU]/h | INTRAVENOUS | Status: DC
  Administered 2024-06-03: 1050 [IU]/h via INTRAVENOUS
  Administered 2024-06-04: 1300 [IU]/h via INTRAVENOUS
  Filled 2024-06-03 (×2): qty 250

## 2024-06-03 MED ORDER — PREGABALIN 75 MG PO CAPS
75.0000 mg | ORAL_CAPSULE | Freq: Two times a day (BID) | ORAL | Status: DC
  Administered 2024-06-03 – 2024-06-05 (×6): 75 mg via ORAL
  Filled 2024-06-03 (×5): qty 1
  Filled 2024-06-03: qty 3

## 2024-06-03 MED ORDER — AMIODARONE LOAD VIA INFUSION
150.0000 mg | Freq: Once | INTRAVENOUS | Status: AC
  Filled 2024-06-03: qty 83.34

## 2024-06-03 NOTE — Progress Notes (Addendum)
 PROGRESS NOTE    Destiny Brown  ZOX:[id number] DOB: November 25, 1946 DOA: 06/02/2024 PCP: Rosslyn Coons, MD    78/F w paroxysmal A-fib on Eliquis , CAD status  PCI to LCx 03/2023, LBBB, type 2 diabetes, hypertension, obesity, RA not on DMARD, CKD stage IIIa, hypothyroidism, OSA not on CPAP.  Recently admitted 5/13-5/16 for A-fib with RVR and wide-complex tachycardia status post DCCV x 1 with return of NSR.  LHC >showed widely patent left circumflex stent with stable moderate proximal LAD stenosis and no evidence of obstructive disease.  Patient was discharged on metoprolol  25 mg twice daily.  patient's other antihypertensive medications including amlodipine , Lasix , hydrochlorothiazide , and losartan  were discontinued. - Had an outpatient echo which noted EF of 25-30%, grade 2 DD, mild MR -Presented to the ED 6/6 with worsening dyspnea, some edema -In the ED tachypneic tachycardic, hypoxic, creatinine 1.2, troponin 79, 221, BNP 1135, chest x-ray with cardiomegaly pulmonary vascular congestion edema and effusions  Subjective: -Starting to feel better, breathing is improving  Assessment and Plan:  Acute systolic CHF, new diagnosis Acute hypoxemic respiratory failure - Continue IV Lasix  today, cards following -Slowly add GDMT as tolerated, BMP in a.m. -May not be a good candidate for SGLT2i - Suspect cardiomyopathy is secondary to recent A-fib RVR, LHC in May noted stable CAD -PT OT, patient indicates desire for short-term rehab  Atrial fibrillation with RVR -Appears to have converted to sinus rhythm now, cards following, currently on Amio gtt. -Continue metoprolol  and IV heparin    Elevated troponin CAD status post PCI to LCx 03/2023 Elevated troponin likely due to demand ischemia rather than ACS.  Troponin 211, 272 - Recent LHC showed widely patent left circumflex stent with stable moderate proximal LAD stenosis and no evidence of obstructive disease. - Cardiology following   QT  prolongation Monitor potassium and magnesium  levels, replace as needed.  Avoid QT prolonging drugs.  OSA/OHS -Unfortunately unable to tolerate CPAP   Type 2 diabetes A1c 6.6 on 05/09/2024.  Placed on sensitive sliding scale insulin  ACHS.   Hypertension IV Lasix  given and continue metoprolol .   Hyperlipidemia Continue Crestor .   CKD stage IIIa Creatinine stable, monitor renal function.   Hypothyroidism Continue Synthroid .   Chronic pain/neuropathy Continue Lyrica .   DVT prophylaxis: Eliquis  Code Status: Full Code (discussed with the patient) Disposition Plan: Interested in rehab now  Consultants:    Procedures:   Antimicrobials:    Objective: Vitals:   06/03/24 0700 06/03/24 0800 06/03/24 0900 06/03/24 0959  BP: (!) 153/55 (!) 162/71 (!) 135/105   Pulse: 73 87 66   Resp: 17 19 19    Temp:    (!) 97.5 F (36.4 C)  TempSrc:    Oral  SpO2: 100% 100% 100%   Weight:      Height:       No intake or output data in the 24 hours ending 06/03/24 1028 Filed Weights   06/02/24 1936  Weight: 83 kg    Examination:  General exam: Appears calm and comfortable obese, AAO x 3 HEENT: Positive JVD Respiratory system: Clear to auscultation Cardiovascular system: S1 & S2 heard, RRR.  Abd: nondistended, soft and nontender.Normal bowel sounds heard. Central nervous system: Alert and oriented. No focal neurological deficits. Extremities: 1-2+ edema Skin: No rashes Psychiatry:  Mood & affect appropriate.     Data Reviewed:   CBC: Recent Labs  Lab 06/02/24 1936 06/02/24 2006  WBC 9.9  --   NEUTROABS 7.7  --   HGB 12.1 12.2  HCT  38.4 36.0  MCV 87.1  --   PLT 202  --    Basic Metabolic Panel: Recent Labs  Lab 06/02/24 1936 06/02/24 2006 06/03/24 0204  NA 138 140 138  K 3.6 3.4* 3.4*  CL 106  --  105  CO2 19*  --  17*  GLUCOSE 271*  --  298*  BUN 19  --  19  CREATININE 1.29*  --  1.44*  CALCIUM  9.1  --  9.3  MG 2.0  --  1.6*   GFR: Estimated  Creatinine Clearance: 33.7 mL/min (A) (by C-G formula based on SCr of 1.44 mg/dL (H)). Liver Function Tests: Recent Labs  Lab 06/02/24 1936  AST 23  ALT 16  ALKPHOS 73  BILITOT 1.0  PROT 6.5  ALBUMIN  3.7   No results for input(s): "LIPASE", "AMYLASE" in the last 168 hours. No results for input(s): "AMMONIA" in the last 168 hours. Coagulation Profile: No results for input(s): "INR", "PROTIME" in the last 168 hours. Cardiac Enzymes: No results for input(s): "CKTOTAL", "CKMB", "CKMBINDEX", "TROPONINI" in the last 168 hours. BNP (last 3 results) No results for input(s): "PROBNP" in the last 8760 hours. HbA1C: No results for input(s): "HGBA1C" in the last 72 hours. CBG: No results for input(s): "GLUCAP" in the last 168 hours. Lipid Profile: No results for input(s): "CHOL", "HDL", "LDLCALC", "TRIG", "CHOLHDL", "LDLDIRECT" in the last 72 hours. Thyroid  Function Tests: No results for input(s): "TSH", "T4TOTAL", "FREET4", "T3FREE", "THYROIDAB" in the last 72 hours. Anemia Panel: No results for input(s): "VITAMINB12", "FOLATE", "FERRITIN", "TIBC", "IRON", "RETICCTPCT" in the last 72 hours. Urine analysis:    Component Value Date/Time   COLORURINE YELLOW 03/15/2017 1140   APPEARANCEUR CLEAR 03/15/2017 1140   LABSPEC 1.010 03/15/2017 1140   PHURINE 7.0 03/15/2017 1140   GLUCOSEU 50 (A) 03/15/2017 1140   HGBUR NEGATIVE 03/15/2017 1140   BILIRUBINUR NEGATIVE 03/15/2017 1140   KETONESUR NEGATIVE 03/15/2017 1140   PROTEINUR NEGATIVE 03/15/2017 1140   UROBILINOGEN 0.2 09/21/2014 1836   NITRITE NEGATIVE 03/15/2017 1140   LEUKOCYTESUR NEGATIVE 03/15/2017 1140   Sepsis Labs: @LABRCNTIP (procalcitonin:4,lacticidven:4)  )No results found for this or any previous visit (from the past 240 hours).   Radiology Studies: DG Chest Portable 1 View Result Date: 06/02/2024 CLINICAL DATA:  Shortness of breath. EXAM: PORTABLE CHEST 1 VIEW COMPARISON:  Chest radiograph dated 05/09/2024. FINDINGS:  There is mild cardiomegaly with vascular congestion and edema. Bibasilar densities may represent atelectasis or pneumonia. Small bilateral pleural effusions suspected. No pneumothorax. Right shoulder arthroplasty. No acute osseous pathology. IMPRESSION: 1. Cardiomegaly with vascular congestion and edema. 2. Bibasilar atelectasis or pneumonia. Electronically Signed   By: Angus Bark M.D.   On: 06/02/2024 19:53   ECHOCARDIOGRAM COMPLETE Result Date: 06/02/2024    ECHOCARDIOGRAM REPORT   Patient Name:   Destiny Brown Date of Exam: 06/02/2024 Medical Rec #:  [id number]        Height:       65.0 in Accession #:    [id number]       Weight:       186.5 lb Date of Birth:  [birth year]        BSA:          1.920 m Patient Age:    78 years         BP:           186/109 mmHg Patient Gender: F                HR:  58 bpm. Exam Location:  [address] Procedure: 2D Echo, Cardiac Doppler and Color Doppler (Both Spectral and Color            Flow Doppler were utilized during procedure). Indications:    R06.00 Dyspnea  History:        Patient has prior history of Echocardiogram examinations, most                 recent 02/24/2023. CAD, s/p LCX DES, Arrythmias:LBBB,                 Signs/Symptoms:Shortness of Breath, Dyspnea and Edema; Risk                 Factors:Hypertension and Obesity. Previouse echo LVEF 60% mild                 LAE.  Sonographer:    Donnita Gales [facility], RDCS Referring Phys: [id number] RENEE LYNN URSUY  Sonographer Comments: Notified DOD Dr. Julane Ny IMPRESSIONS  1. There is an L wave in the mitral inflow pattern suggesting significant diastolic dysfunction and increased filling pressures. Left ventricular ejection fraction, by estimation, is 25 to 30%. The left ventricle has severely decreased function. The left ventricle demonstrates global hypokinesis. There is mild concentric left ventricular hypertrophy. Left ventricular diastolic parameters are consistent with Grade II diastolic dysfunction  (pseudonormalization).  2. Right ventricular systolic function is mildly reduced. The right ventricular size is normal. There is mildly elevated pulmonary artery systolic pressure. The estimated right ventricular systolic pressure is 36.9 mmHg.  3. Left atrial size was moderately dilated.  4. The mitral valve is normal in structure. Mild mitral valve regurgitation. No evidence of mitral stenosis.  5. The aortic valve is tricuspid. There is mild calcification of the aortic valve. Aortic valve regurgitation is not visualized. Aortic valve sclerosis/calcification is present, without any evidence of aortic stenosis. Conclusion(s)/Recommendation(s): Severe global LV dysfunction new since previous. Doppler parameters suggestive of high left-side filling pressures. FINDINGS  Left Ventricle: There is an L wave in the mitral inflow pattern suggesting significant diastolic dysfunction and increased filling pressures. Left ventricular ejection fraction, by estimation, is 25 to 30%. The left ventricle has severely decreased function. The left ventricle demonstrates global hypokinesis. The left ventricular internal cavity size was normal in size. There is mild concentric left ventricular hypertrophy. Left ventricular diastolic parameters are consistent with Grade II diastolic dysfunction (pseudonormalization). Right Ventricle: The right ventricular size is normal. No increase in right ventricular wall thickness. Right ventricular systolic function is mildly reduced. There is mildly elevated pulmonary artery systolic pressure. The tricuspid regurgitant velocity  is 2.69 m/s, and with an assumed right atrial pressure of 8 mmHg, the estimated right ventricular systolic pressure is 36.9 mmHg. Left Atrium: Left atrial size was moderately dilated. Right Atrium: Right atrial size was normal in size. Pericardium: There is no evidence of pericardial effusion. Mitral Valve: The mitral valve is normal in structure. Mild mitral valve  regurgitation. No evidence of mitral valve stenosis. Tricuspid Valve: The tricuspid valve is normal in structure. Tricuspid valve regurgitation is trivial. No evidence of tricuspid stenosis. Aortic Valve: The aortic valve is tricuspid. There is mild calcification of the aortic valve. Aortic valve regurgitation is not visualized. Aortic valve sclerosis/calcification is present, without any evidence of aortic stenosis. Pulmonic Valve: The pulmonic valve was normal in structure. Pulmonic valve regurgitation is not visualized. No evidence of pulmonic stenosis. Aorta: The aortic root is normal in size and structure. IAS/Shunts: No atrial level shunt  detected by color flow Doppler.  LEFT VENTRICLE PLAX 2D LVIDd:         5.00 cm   Diastology LVIDs:         4.00 cm   LV e' medial:    3.92 cm/s LV PW:         0.80 cm   LV E/e' medial:  23.1 LV IVS:        1.20 cm   LV e' lateral:   4.57 cm/s LVOT diam:     2.00 cm   LV E/e' lateral: 19.8 LV SV:         63 LV SV Index:   33 LVOT Area:     3.14 cm  RIGHT VENTRICLE            IVC RV Basal diam:  3.20 cm    IVC diam: 2.50 cm RV Mid diam:    2.10 cm RV S prime:     9.14 cm/s TAPSE (M-mode): 2.1 cm RVSP:           36.9 mmHg LEFT ATRIUM             Index        RIGHT ATRIUM           Index LA diam:        4.90 cm 2.55 cm/m   RA Pressure: 8.00 mmHg LA Vol (A2C):   65.9 ml 34.32 ml/m  RA Area:     10.10 cm LA Vol (A4C):   35.6 ml 18.54 ml/m  RA Volume:   20.80 ml  10.83 ml/m LA Biplane Vol: 48.6 ml 25.31 ml/m  AORTIC VALVE LVOT Vmax:   79.70 cm/s LVOT Vmean:  51.900 cm/s LVOT VTI:    0.200 m  AORTA Ao Root diam: 2.70 cm Ao Asc diam:  2.70 cm MITRAL VALVE               TRICUSPID VALVE MV Area (PHT): 3.03 cm    TR Peak grad:   28.9 mmHg MV Decel Time: 250 msec    TR Vmax:        269.00 cm/s MV E velocity: 90.70 cm/s  Estimated RAP:  8.00 mmHg MV A velocity: 73.40 cm/s  RVSP:           36.9 mmHg MV E/A ratio:  1.24                            SHUNTS                             Systemic VTI:  0.20 m                            Systemic Diam: 2.00 cm Jules Oar MD Electronically signed by Jules Oar MD Signature Date/Time: 06/02/2024/10:09:39 AM    Final      Scheduled Meds:  furosemide   40 mg Intravenous BID   insulin  aspart  0-5 Units Subcutaneous QHS   insulin  aspart  0-9 Units Subcutaneous TID WC   latanoprost   1 drop Both Eyes QHS   levothyroxine   100 mcg Oral Q0600   metoprolol  tartrate  25 mg Oral BID   potassium chloride   40 mEq Oral BID   pregabalin   75 mg Oral BID   [START ON 06/05/2024] rosuvastatin   5 mg Oral Once  per day on Monday Thursday   Continuous Infusions:  amiodarone 60 mg/hr (06/03/24 0616)   Followed by   amiodarone     amiodarone     heparin  1,050 Units/hr (06/03/24 0539)     LOS: 1 day    Time spent:    Deforest Fast, MD [facility]   06/03/2024, 10:28 AM

## 2024-06-03 NOTE — Consult Note (Signed)
   Electrophysiology consultation   Patient ID: Destiny Brown MRN: [id number]; DOB: [birth year]  Admit date: 06/02/2024 Date of Consult: 06/03/2024  PCP:  Rosslyn Coons, MD   History of Present Illness:   Ms. Destiny Brown is a 78 year old woman who I am seeing today for an evaluation of atrial fibrillation at the request of Dr. Ossie Blend.  The patient has a history of atrial fibrillation, coronary disease, hypertension, diabetes, CKD.  She has been hospitalized in the recent past for atrial fibrillation requiring cardioversion.  Her EF was reduced to 25 to 30% on outpatient echocardiogram.  She called EMS yesterday because of worsening shortness of breath.  EMS found her to be tachycardic in atrial fibrillation with rapidly conducted ventricular rates.  She was given IV Lasix .  She converted back to sinus rhythm with improvement in her symptoms.  She did return to atrial fibrillation with aberrantly conducted rapid ventricular rates.  She was bolused with IV amiodarone and then started on an infusion that ultimately reverted her back to sinus rhythm.  I am asked to weigh in about rhythm control options.   Past medical, surgical, social and family history reviewed.  ROS:  Please see the history of present illness.  All other ROS reviewed and negative.     Physical Exam/Data:   Vitals:   06/03/24 0800 06/03/24 0900 06/03/24 0959 06/03/24 1000  BP: (!) 162/71 (!) 135/105  (!) 152/80  Pulse: 87 66  (!) 56  Resp: 19 19  19   Temp:   (!) 97.5 F (36.4 C)   TempSrc:   Oral   SpO2: 100% 100%  100%  Weight:      Height:        General:  Well nourished, well developed, in no acute distress Cardiac:  normal S1, S2; RRR; no murmur  Lungs:  clear to auscultation bilaterally, no wheezing, rhonchi or rales  Psych:  Normal affect   EKG:  The EKG was personally reviewed and demonstrates:   July 03, 2024 EKG shows atrial fibrillation. June 02, 2024 EKG shows sinus rhythm.  Left bundle branch  block May 11, 2024 EKG shows sinus rhythm.  Left bundle branch block. May 09, 2024 EKG shows atrial fibrillation with rapidly conducted ventricular rates  Telemetry:  Telemetry was personally reviewed and demonstrates: Sinus rhythm    Assessment and Plan:   #Atrial fibrillation Rapidly conducted and associated with decompensated systolic heart failure.  Rhythm control is indicated.  Given her heart failure and prior use of amiodarone, favor continuing with this regimen.  Continue IV amiodarone for an additional 24 to 48 hours before transitioning to oral.  Not currently a candidate for ablation therapy. Continue heparin .  #Acute on chronic systolic heart failure NYHA class III.  Rhythm control indicated as above.  Continue Lasix . Keep K greater than 4 and mag greater than 2     Nili Honda T. Marven Slimmer, MD, [facility], [facility] Cardiac Electrophysiology

## 2024-06-03 NOTE — Progress Notes (Signed)
 ANTICOAGULATION CONSULT NOTE  Pharmacy Consult for Heparin  Indication: chest pain/ACS  Allergies  Allergen Reactions   Oxycodone Other (See Comments)    Severe headaches, hallucinations    Codeine Other (See Comments)    Severe Headaches with codeine; but not with other opioids    Patient Measurements: Height: 5\' 5"  (165.1 cm) Weight: 83 kg (182 lb 15.7 oz) IBW/kg (Calculated) : 57 Heparin  Dosing Weight: 74.8 kg  Vital Signs: Temp: 98.4 F (36.9 C) (06/07 1403) Temp Source: Oral (06/07 1403) BP: 153/131 (06/07 1300) Pulse Rate: 67 (06/07 1300)  Labs: Recent Labs    06/02/24 1936 06/02/24 2006 06/02/24 2317 06/03/24 0204 06/03/24 0638 06/03/24 1417  HGB 12.1 12.2  --   --   --   --   HCT 38.4 36.0  --   --   --   --   PLT 202  --   --   --   --   --   APTT  --   --   --   --   --  65*  HEPARINUNFRC  --   --   --   --   --  >1.10*  CREATININE 1.29*  --   --  1.44*  --   --   TROPONINIHS 79*  --  211* 292* 272*  --     Estimated Creatinine Clearance: 33.7 mL/min (A) (by C-G formula based on SCr of 1.44 mg/dL (H)).   Medical History: Past Medical History:  Diagnosis Date   Arthritis    Atrial fibrillation with RVR (HCC) 09/21/2014   Bronchitis    h/o   Diabetes mellitus    NO MEDS,  DIET CONTROLLED   History of migraines    "when working under alot of stress"   Hypertension    Hypothyroid    LBBB (left bundle branch block) 09/22/2014   OSA (obstructive sleep apnea) 04/09/2015   does not use cpap   PONV (postoperative nausea and vomiting)    PT DOESNOT WANT TO TAKE HER ATENOLOL      DOS.   " IT DROPS MY BP TO LOW"    Medications:  Medications Prior to Admission  Medication Sig Dispense Refill Last Dose/Taking   albuterol  (VENTOLIN  HFA) 108 (90 Base) MCG/ACT inhaler Inhale 2 puffs into the lungs every 6 (six) hours as needed for wheezing or shortness of breath (Cough). 18 g 2 06/02/2024 Evening   apixaban  (ELIQUIS ) 5 MG TABS tablet Take 1 tablet (5 mg  total) by mouth 2 (two) times daily. 60 tablet 1 06/02/2024 at  5:00 PM   Cholecalciferol  (VITAMIN D3) 1000 UNITS CAPS Take 1,000 Units by mouth in the morning.   06/02/2024 Morning   cyanocobalamin (FINEST NUTRITION VITAMIN B-12) 500 MCG tablet Take 500 mcg by mouth daily.   06/02/2024 Morning   latanoprost  (XALATAN ) 0.005 % ophthalmic solution Place 1 drop into both eyes at bedtime.   06/01/2024   metFORMIN  (GLUCOPHAGE ) 1000 MG tablet Take 1,000 mg by mouth 2 (two) times daily.   06/02/2024 Evening   metoprolol  tartrate (LOPRESSOR ) 25 MG tablet Take 1 tablet (25 mg total) by mouth 2 (two) times daily. Hold mediation if SBP < 100 or if heart rate < 60. 60 tablet 1 06/02/2024 Evening   pregabalin  (LYRICA ) 75 MG capsule Take 75 mg by mouth 2 (two) times daily.   06/02/2024 Evening   rosuvastatin  (CRESTOR ) 5 MG tablet Take 5 mg by mouth 2 (two) times a week. Mondays & Thursdays.  06/01/2024   levothyroxine  (SYNTHROID ) 100 MCG tablet Take 1 tablet (100 mcg total) by mouth daily at 6 (six) AM. 30 tablet 1    Scheduled:   furosemide   40 mg Intravenous BID   insulin  aspart  0-5 Units Subcutaneous QHS   insulin  aspart  0-9 Units Subcutaneous TID WC   latanoprost   1 drop Both Eyes QHS   levothyroxine   100 mcg Oral Q0600   metoprolol  tartrate  25 mg Oral BID   potassium chloride   40 mEq Oral BID   pregabalin   75 mg Oral BID   [START ON 06/05/2024] rosuvastatin   5 mg Oral Once per day on Monday Thursday   Infusions:   amiodarone 30 mg/hr (06/03/24 1217)   amiodarone     heparin  1,050 Units/hr (06/03/24 1050)   PRN: acetaminophen  **OR** acetaminophen , amiodarone  Assessment: 66 yoF presented to ED with shortness of breath. PMH includes pAF (PTA Eliquis ), CAD (PCI Lcx 2024), LBBB, T2DM, HTN, CKD Stage IIIa, and new onset HF (LVEF 25-30%). Patient with recent admission for afib RVR and LHC not shwing obstructive disease. Pharmacy consulted to dose heparin  for ACS given rising troponin (no chest pain reported)    Patient was on apixaban  prior to arrival. Last dose 6/6 1700. Utilizing aPTT monitoring due to likely falsely high anti-Xa level secondary to DOAC use.  Started on 1050 units/hr IV heparin . Resulting aPTT level is 65 which is borderline sub-therapeutic. Heparin  level is > 1.1 secondary to apixaban . Will utilize aPTT for monitoring for now.  No issues with infusion or with bleeding per RN.  Hgb 12.2; plt 202  Goal of Therapy:  Heparin  level 0.3-0.7 units/ml aPTT 66-102 seconds Monitor platelets by anticoagulation protocol: Yes   Plan:  Give 1000 units IV heparin  bolus. Increase heparin  infusion to 1200 units/hr Check aPTT & anti-Xa level at 0000  Daily aPTT/HL while on heparin  Continue to monitor via aPTT until levels are correlated Continue to monitor H&H and platelets  Dionicio Fray, PharmD, BCPS 06/03/2024 3:11 PM ED Clinical Pharmacist -  [phone]

## 2024-06-03 NOTE — ED Notes (Addendum)
 At 0604 this RN received a phone call from CCMD that pt heart rhythm has gone into Vtach and HR in the 210s. This RN went to bedside where pt stated "I do not feel good, I feel super hot right now." This RN brought Edwina Gram PA and Liana Reding to bedside while another RN grabbed the code cart and stayed with pt. Edwina Gram PA was given information on the pt while another RN placed pt on the zoll monitor. Edwina Gram PA gave verbal orders to this RN for amio bolus and then an amio drip to be started. At 0610 another RN placed an 18 in the RAC of pt. At [phone] Cardiology returned the page that was placed and was updated on the situation, MD stated he was on the way. At 0612 the amio bolus was started, at 0616 the bolus finished and the amio drip was started. Edwina Gram PA also gave verbal orders for 2g mag to be started on pt. Pharmacy was called by this RN to see if amio and mag were compatible since pt has heparin  infusing in the LAC. Another RN attempted to get a third IV access. At [phone] pt converted and is now in sinus rhythm. Cardiology MD also at bedside at [phone]. Pt reported feeling better at this time. At 0625 mag was started in the IV in the R forearm that another RN placed.  Pt is resting in bed, on cardiac monitor as well as the zoll monitor. Pt given personal cell phone after she stated she wanted to update her family. Pt given call light.

## 2024-06-03 NOTE — ED Provider Notes (Signed)
  6:24 AM Called to bedside around [phone] as patient went into sustained V tach at 220-240 while waiting for IP bed upstairs.  She remained awake/alert during this with palpable pulse.  She was placed on Zoll and attempted valsalva x2 without success.  Given amiodarone bolus along with 2g Mg+ as this was low on prior labs at 1.6.  After bolus, rate slowed to 110's-140's.  Started amiodarone infusion.  Patient did convert to rate controlled AFIB shortly after initiation of drip. Has hx of same.  Cardiology, Dr. Ossie Blend, presented to bedside and witnessed her convert.  Repeat EKG captured.  Will continue amio for now.  K+ is also low so will give run of this as well.    6:37 AM Re-checked, now NSR in the 70's.  She is asymptomatic currently.  HD stable.  Awaiting re-eval by AM hospitalist team.   CRITICAL CARE Performed by: Coretha Dew   Total critical care time: 35 minutes  Critical care time was exclusive of separately billable procedures and treating other patients.  Critical care was necessary to treat or prevent imminent or life-threatening deterioration.  Critical care was time spent personally by me on the following activities: development of treatment plan with patient and/or surrogate as well as nursing, discussions with consultants, evaluation of patient's response to treatment, examination of patient, obtaining history from patient or surrogate, ordering and performing treatments and interventions, ordering and review of laboratory studies, ordering and review of radiographic studies, pulse oximetry and re-evaluation of patient's condition.    Coretha Dew, PA-C 06/03/24 9604    Ballard Bongo, MD 06/04/24 0001

## 2024-06-03 NOTE — Consult Note (Signed)
 Cardiology Consultation:   Patient ID: JAYDN FINCHER MRN: [id number]; DOB: 01-13-1946  Admit date: 06/02/2024 Date of Consult: 06/03/2024  Primary Care Provider: Rosslyn Coons, MD Primary Cardiologist: Richardo Chandler, MD  Primary Electrophysiologist:  Richardo Chandler, MD    Patient Profile:   Destiny Brown is a 78 y.o. female with a hx of atrial fibrillation, CAD, hypertension, diabetes, CKD who is being seen today for the evaluation of heart failure at the request of emergency department.  History of Present Illness:   Destiny Brown is a 78 y.o. female who has the above medical problems who was recently admitted for atrial fibrillation and cardioversion.  She has had ischemic workup recently that showed nonobstructive coronary disease.  She was going for outpatient evaluation and had echocardiogram done that showed a new EF of 25 to 30%.  She has also had worsening shortness of breath.  Yesterday she called EMS because of progressive shortness of breath and on arrival was found to have hypoxia with saturations in the 80s.  She was also tachycardic at that time.   In the emergency department, she was given IV Lasix  and responded well.  Had stabilized throughout the night and heart rate was back into sinus rhythm.  Unfortunately at 6 AM on 06/03/2024 she went into a wide-complex tachycardia that was reported greater than 200.  She was started on amiodarone infusion.  Never lost pulse.  On my arrival to bedside again to see her she was in a wide-complex tachycardia that was irregular in going the 160s that appeared to be atrial fibrillation with RVR.  After amnio bolus and started on the infusion she reverted to sinus rhythm.     Past Medical History:  Diagnosis Date   Arthritis    Atrial fibrillation with RVR (HCC) 09/21/2014   Bronchitis    h/o   Diabetes mellitus    NO MEDS,  DIET CONTROLLED   History of migraines    "when working under alot of stress"   Hypertension    Hypothyroid     LBBB (left bundle branch block) 09/22/2014   OSA (obstructive sleep apnea) 04/09/2015   does not use cpap   PONV (postoperative nausea and vomiting)    PT DOESNOT WANT TO TAKE HER ATENOLOL      DOS.   " IT DROPS MY BP TO LOW"     Past Surgical History:  Procedure Laterality Date   ABDOMINAL HYSTERECTOMY  03/2001   APPENDECTOMY     BACK SURGERY     CATARACT EXTRACTION, BILATERAL     WITH IMPLANTS   CERVICAL FUSION  08/2003   CHOLECYSTECTOMY  03/2010   COLONOSCOPY WITH PROPOFOL  N/A 08/27/2023   Procedure: COLONOSCOPY WITH PROPOFOL ;  Surgeon: Alvis Jourdain, MD;  Location: [facility]OSCOPY;  Service: Gastroenterology;  Laterality: N/A;   CORONARY STENT INTERVENTION N/A 04/23/2023   Procedure: CORONARY STENT INTERVENTION;  Surgeon: Arnoldo Lapping, MD;  Location: [facility] INVASIVE CV LAB;  Service: Cardiovascular;  Laterality: N/A;   ESOPHAGOGASTRODUODENOSCOPY N/A 08/27/2023   Procedure: ESOPHAGOGASTRODUODENOSCOPY (EGD);  Surgeon: Alvis Jourdain, MD;  Location: [facility];  Service: Gastroenterology;  Laterality: N/A;   EYE SURGERY     GIVENS CAPSULE STUDY N/A 11/18/2023   Procedure: GIVENS CAPSULE STUDY;  Surgeon: Alvis Jourdain, MD;  Location: [facility] ENDOSCOPY;  Service: Gastroenterology;  Laterality: N/A;   LEFT HEART CATH AND CORONARY ANGIOGRAPHY N/A 04/23/2023   Procedure: LEFT HEART CATH AND CORONARY ANGIOGRAPHY;  Surgeon: Arnoldo Lapping, MD;  Location: [facility] INVASIVE CV LAB;  Service: Cardiovascular;  Laterality: N/A;   LEFT HEART CATH AND CORONARY ANGIOGRAPHY N/A 05/11/2024   Procedure: LEFT HEART CATH AND CORONARY ANGIOGRAPHY;  Surgeon: Wenona Hamilton, MD;  Location: [facility] INVASIVE CV LAB;  Service: Cardiovascular;  Laterality: N/A;   LUMBAR DISC SURGERY     LUMBAR LAMINECTOMY/DECOMPRESSION MICRODISCECTOMY  10/2010   POSTERIOR FUSION LUMBAR SPINE  12/16/11; 12/2005; 07/2004   L2-3; L3-4; L4-5   REVERSE SHOULDER ARTHROPLASTY Right 02/26/2022   Procedure: REVERSE SHOULDER ARTHROPLASTY;  Surgeon: Sammye Cristal, MD;  Location: [facility] ORS;  Service: Orthopedics;  Laterality: Right;   TOTAL KNEE ARTHROPLASTY Right 03/18/2017   Procedure: RIGHT TOTAL KNEE ARTHROPLASTY;  Surgeon: Genevie Kerns, MD;  Location: [facility] ORS;  Service: Orthopedics;  Laterality: Right;      Inpatient Medications: Scheduled Meds:  amiodarone  150 mg Intravenous Once   insulin  aspart  0-5 Units Subcutaneous QHS   insulin  aspart  0-9 Units Subcutaneous TID WC   latanoprost   1 drop Both Eyes QHS   levothyroxine   100 mcg Oral Q0600   metoprolol  tartrate  25 mg Oral BID   pregabalin   75 mg Oral BID   [START ON 06/05/2024] rosuvastatin   5 mg Oral Once per day on Monday Thursday   Continuous Infusions:  amiodarone     Followed by   amiodarone     amiodarone     amiodarone     heparin  1,050 Units/hr (06/03/24 0539)   magnesium  sulfate     magnesium  sulfate bolus IVPB     potassium chloride      PRN Meds: acetaminophen  **OR** acetaminophen , amiodarone, amiodarone, magnesium  sulfate  Allergies:    Allergies  Allergen Reactions   Oxycodone Other (See Comments)    Severe headaches, hallucinations    Codeine Other (See Comments)    Severe Headaches with codeine; but not with other opioids    Social History:   Social History   Socioeconomic History   Marital status: Married    Spouse name: Not on file   Number of children: 2   Years of education: 13   Highest education level: Not on file  Occupational History   Occupation: part time-[employer]   Occupation: retired  Tobacco Use   Smoking status: Never   Smokeless tobacco: Never  Vaping Use   Vaping status: Never Used  Substance and Sexual Activity   Alcohol use: No   Drug use: No   Sexual activity: Never  Other Topics Concern   Not on file  Social History Narrative   Patient drinks about 2-3 cups of caffeine daily.   Patient is right handed.    Social Drivers of Corporate investment banker Strain: Not on file  Food Insecurity: No Food  Insecurity (05/10/2024)   Hunger Vital Sign    Worried About Running Out of Food in the Last Year: Never true    Ran Out of Food in the Last Year: Never true  Transportation Needs: No Transportation Needs (05/10/2024)   PRAPARE - Administrator, Civil Service (Medical): No    Lack of Transportation (Non-Medical): No  Physical Activity: Not on file  Stress: Not on file  Social Connections: Socially Integrated (05/10/2024)   Social Connection and Isolation Panel [NHANES]    Frequency of Communication with Friends and Family: Twice a week    Frequency of Social Gatherings with Friends and Family: Twice a week    Attends Religious Services: More than 4 times per year  Active Member of Clubs or Organizations: Yes    Attends Banker Meetings: More than 4 times per year    Marital Status: Married  Catering manager Violence: Not At Risk (05/10/2024)   Humiliation, Afraid, Rape, and Kick questionnaire    Fear of Current or Ex-Partner: No    Emotionally Abused: No    Physically Abused: No    Sexually Abused: No    Family History:    Family History  Adopted: Yes  Family history unknown: Yes     Review of Systems: All other review of systems are negative unless otherwise noted in the HPI as above.   Physical Exam/Data:   Vitals:   06/03/24 7829 06/03/24 0627 06/03/24 0628 06/03/24 0629  BP:      Pulse: 68 68 65 63  Resp: (!) 21 (!) 29 20 19   Temp:      TempSrc:      SpO2: 100% 100% 100% 100%  Weight:      Height:       No intake or output data in the 24 hours ending 06/03/24 0648 Filed Weights   06/02/24 1936  Weight: 83 kg   Body mass index is 30.45 kg/m.  General appearance: alert, cooperative, and appears stated age Lungs: clear to auscultation bilaterally Heart: irregularly irregular rhythm Pulses: 2+ and symmetric Skin: Skin color, texture, turgor normal. No rashes or lesions Neurologic: Grossly normal  EKG:  The EKG was personally reviewed  and demonstrates: A-fib with RVR Telemetry:  Telemetry was personally reviewed and demonstrates: A-fib  Relevant CV Studies: Echo recent LV EF of 25 to 30%  Laboratory Data:  Chemistry Recent Labs  Lab 06/02/24 1936 06/02/24 2006 06/03/24 0204  NA 138 140 138  K 3.6 3.4* 3.4*  CL 106  --  105  CO2 19*  --  17*  GLUCOSE 271*  --  298*  BUN 19  --  19  CREATININE 1.29*  --  1.44*  CALCIUM  9.1  --  9.3  GFRNONAA 42*  --  37*  ANIONGAP 13  --  16*    Recent Labs  Lab 06/02/24 1936  PROT 6.5  ALBUMIN  3.7  AST 23  ALT 16  ALKPHOS 73  BILITOT 1.0   Hematology Recent Labs  Lab 06/02/24 1936 06/02/24 2006  WBC 9.9  --   RBC 4.41  --   HGB 12.1 12.2  HCT 38.4 36.0  MCV 87.1  --   MCH 27.4  --   MCHC 31.5  --   RDW 14.4  --   PLT 202  --    Cardiac EnzymesNo results for input(s): "TROPONINI" in the last 168 hours. No results for input(s): "TROPIPOC" in the last 168 hours.  BNP Recent Labs  Lab 06/02/24 1936  BNP 1,135.6*    DDimer No results for input(s): "DDIMER" in the last 168 hours.  Radiology/Studies:  DG Chest Portable 1 View Result Date: 06/02/2024 CLINICAL DATA:  Shortness of breath. EXAM: PORTABLE CHEST 1 VIEW COMPARISON:  Chest radiograph dated 05/09/2024. FINDINGS: There is mild cardiomegaly with vascular congestion and edema. Bibasilar densities may represent atelectasis or pneumonia. Small bilateral pleural effusions suspected. No pneumothorax. Right shoulder arthroplasty. No acute osseous pathology. IMPRESSION: 1. Cardiomegaly with vascular congestion and edema. 2. Bibasilar atelectasis or pneumonia. Electronically Signed   By: Angus Bark M.D.   On: 06/02/2024 19:53   ECHOCARDIOGRAM COMPLETE Result Date: 06/02/2024    ECHOCARDIOGRAM REPORT   Patient Name:  Lael Pierce Date of Exam: 06/02/2024 Medical Rec #:  [id number]        Height:       65.0 in Accession #:    [id number]       Weight:       186.5 lb Date of Birth:  Jan 14, 1946        BSA:           1.920 m Patient Age:    78 years         BP:           186/109 mmHg Patient Gender: F                HR:           58 bpm. Exam Location:  [address] Procedure: 2D Echo, Cardiac Doppler and Color Doppler (Both Spectral and Color            Flow Doppler were utilized during procedure). Indications:    R06.00 Dyspnea  History:        Patient has prior history of Echocardiogram examinations, most                 recent 02/24/2023. CAD, s/p LCX DES, Arrythmias:LBBB,                 Signs/Symptoms:Shortness of Breath, Dyspnea and Edema; Risk                 Factors:Hypertension and Obesity. Previouse echo LVEF 60% mild                 LAE.  Sonographer:    Donnita Gales [facility], RDCS Referring Phys: [id number] RENEE LYNN URSUY  Sonographer Comments: Notified DOD Dr. Julane Ny IMPRESSIONS  1. There is an L wave in the mitral inflow pattern suggesting significant diastolic dysfunction and increased filling pressures. Left ventricular ejection fraction, by estimation, is 25 to 30%. The left ventricle has severely decreased function. The left ventricle demonstrates global hypokinesis. There is mild concentric left ventricular hypertrophy. Left ventricular diastolic parameters are consistent with Grade II diastolic dysfunction (pseudonormalization).  2. Right ventricular systolic function is mildly reduced. The right ventricular size is normal. There is mildly elevated pulmonary artery systolic pressure. The estimated right ventricular systolic pressure is 36.9 mmHg.  3. Left atrial size was moderately dilated.  4. The mitral valve is normal in structure. Mild mitral valve regurgitation. No evidence of mitral stenosis.  5. The aortic valve is tricuspid. There is mild calcification of the aortic valve. Aortic valve regurgitation is not visualized. Aortic valve sclerosis/calcification is present, without any evidence of aortic stenosis. Conclusion(s)/Recommendation(s): Severe global LV dysfunction new since previous. Doppler  parameters suggestive of high left-side filling pressures. FINDINGS  Left Ventricle: There is an L wave in the mitral inflow pattern suggesting significant diastolic dysfunction and increased filling pressures. Left ventricular ejection fraction, by estimation, is 25 to 30%. The left ventricle has severely decreased function. The left ventricle demonstrates global hypokinesis. The left ventricular internal cavity size was normal in size. There is mild concentric left ventricular hypertrophy. Left ventricular diastolic parameters are consistent with Grade II diastolic dysfunction (pseudonormalization). Right Ventricle: The right ventricular size is normal. No increase in right ventricular wall thickness. Right ventricular systolic function is mildly reduced. There is mildly elevated pulmonary artery systolic pressure. The tricuspid regurgitant velocity  is 2.69 m/s, and with an assumed right atrial pressure of 8 mmHg, the estimated right ventricular systolic pressure is 36.9 mmHg.  Left Atrium: Left atrial size was moderately dilated. Right Atrium: Right atrial size was normal in size. Pericardium: There is no evidence of pericardial effusion. Mitral Valve: The mitral valve is normal in structure. Mild mitral valve regurgitation. No evidence of mitral valve stenosis. Tricuspid Valve: The tricuspid valve is normal in structure. Tricuspid valve regurgitation is trivial. No evidence of tricuspid stenosis. Aortic Valve: The aortic valve is tricuspid. There is mild calcification of the aortic valve. Aortic valve regurgitation is not visualized. Aortic valve sclerosis/calcification is present, without any evidence of aortic stenosis. Pulmonic Valve: The pulmonic valve was normal in structure. Pulmonic valve regurgitation is not visualized. No evidence of pulmonic stenosis. Aorta: The aortic root is normal in size and structure. IAS/Shunts: No atrial level shunt detected by color flow Doppler.  LEFT VENTRICLE PLAX 2D LVIDd:          5.00 cm   Diastology LVIDs:         4.00 cm   LV e' medial:    3.92 cm/s LV PW:         0.80 cm   LV E/e' medial:  23.1 LV IVS:        1.20 cm   LV e' lateral:   4.57 cm/s LVOT diam:     2.00 cm   LV E/e' lateral: 19.8 LV SV:         63 LV SV Index:   33 LVOT Area:     3.14 cm  RIGHT VENTRICLE            IVC RV Basal diam:  3.20 cm    IVC diam: 2.50 cm RV Mid diam:    2.10 cm RV S prime:     9.14 cm/s TAPSE (M-mode): 2.1 cm RVSP:           36.9 mmHg LEFT ATRIUM             Index        RIGHT ATRIUM           Index LA diam:        4.90 cm 2.55 cm/m   RA Pressure: 8.00 mmHg LA Vol (A2C):   65.9 ml 34.32 ml/m  RA Area:     10.10 cm LA Vol (A4C):   35.6 ml 18.54 ml/m  RA Volume:   20.80 ml  10.83 ml/m LA Biplane Vol: 48.6 ml 25.31 ml/m  AORTIC VALVE LVOT Vmax:   79.70 cm/s LVOT Vmean:  51.900 cm/s LVOT VTI:    0.200 m  AORTA Ao Root diam: 2.70 cm Ao Asc diam:  2.70 cm MITRAL VALVE               TRICUSPID VALVE MV Area (PHT): 3.03 cm    TR Peak grad:   28.9 mmHg MV Decel Time: 250 msec    TR Vmax:        269.00 cm/s MV E velocity: 90.70 cm/s  Estimated RAP:  8.00 mmHg MV A velocity: 73.40 cm/s  RVSP:           36.9 mmHg MV E/A ratio:  1.24                            SHUNTS                            Systemic VTI:  0.20 m  Systemic Diam: 2.00 cm Jules Oar MD Electronically signed by Jules Oar MD Signature Date/Time: 06/02/2024/10:09:39 AM    Final     Assessment and Plan:   Acute hypoxic respiratory failure likely secondary to systolic heart failure EF 25 to 30% Unclear the trigger of her new and worsening heart failure.  My suspicion is likely A-fib with RVR given her recent troubles and the fact that she is gone into wide-complex tachycardia that at most point seem to be A-fib with RVR frequently here.  She is volume up and needs further diuretics.  Will likely need to discuss ablation at some point with our EP colleagues. Recommendations: -continue amiodarone  for afib -agree with lasix  -continue heparin  for anticoagulation - will need to discuss afib management with EP    For questions or updates, please contact [facility] Please consult [email or web address] for contact info under     Signed, Bert Britain, MD  06/03/2024 6:48 AM

## 2024-06-03 NOTE — Progress Notes (Addendum)
 PHARMACY - ANTICOAGULATION CONSULT NOTE  Pharmacy Consult for heparin  Indication: chest pain/ACS, atrial fibrillation  Allergies  Allergen Reactions   Oxycodone Other (See Comments)    Severe headaches, hallucinations    Codeine Other (See Comments)    Severe Headaches with codeine; but not with other opioids    Patient Measurements: Height: 5\' 5"  (165.1 cm) Weight: 83 kg (182 lb 15.7 oz) IBW/kg (Calculated) : 57 HEPARIN  DW (KG): 74.8  Vital Signs: Temp: 98.2 F (36.8 C) (06/06 1935) Temp Source: Oral (06/06 1935) BP: 151/65 (06/07 0315) Pulse Rate: 75 (06/07 0315)  Labs: Recent Labs    06/02/24 1936 06/02/24 2006 06/02/24 2317 06/03/24 0204  HGB 12.1 12.2  --   --   HCT 38.4 36.0  --   --   PLT 202  --   --   --   CREATININE 1.29*  --   --  1.44*  TROPONINIHS 79*  --  211* 292*    Estimated Creatinine Clearance: 33.7 mL/min (A) (by C-G formula based on SCr of 1.44 mg/dL (H)).   Medical History: Past Medical History:  Diagnosis Date   Arthritis    Atrial fibrillation with RVR (HCC) 09/21/2014   Bronchitis    h/o   Diabetes mellitus    NO MEDS,  DIET CONTROLLED   History of migraines    "when working under alot of stress"   Hypertension    Hypothyroid    LBBB (left bundle branch block) 09/22/2014   OSA (obstructive sleep apnea) 04/09/2015   does not use cpap   PONV (postoperative nausea and vomiting)    PT DOESNOT WANT TO TAKE HER ATENOLOL      DOS.   " IT DROPS MY BP TO LOW"    Medications:  -Eliquis  5mg  PO BID (last dose 6/6 @1700 )  Assessment: 41 yoF presented to ED with shortness of breath. PMH includes pAF (PTA Eliquis ), CAD (PCI Lcx 2024), LBBB, T2DM, HTN, CKD Stage IIIa, and new onset HF (LVEF 25-30%). Patient with recent admission for afib RVR and LHC not shwing obstructive disease. Pharmacy consulted to dose heparin  for ACS given rising troponin (no chest pain reported)  -CBC stable  Goal of Therapy:  Heparin  level 0.3-0.7 units/ml aPTT  66-102 seconds Monitor platelets by anticoagulation protocol: Yes   Plan:  -No bolus given recent eliquis  use -Start heparin  infusion at 1050 units/hr at 0500 -aPTT and heparin  level in 8 hours -Monitor with aPTTs until correlates with heparin  levels -Follow up cards consult   Young Hensen, PharmD, BCPS Clinical Pharmacist 06/03/2024 3:53 AM

## 2024-06-03 NOTE — H&P (Signed)
 History and Physical    Destiny Brown ZOX:[id number] DOB: October 19, 1946 DOA: 06/02/2024  PCP: Rosslyn Coons, MD  Patient coming from: Home  Chief Complaint: Shortness of breath  HPI: Destiny Brown is a 78 y.o. female with medical history significant of paroxysmal A-fib on Eliquis , CAD status post PCI to LCx 03/2023, LBBB, type 2 diabetes, hypertension, obesity, RA not on DMARD, CKD stage IIIa, hypothyroidism, OSA not on CPAP.  Recently admitted 5/13-5/16 for A-fib with RVR and wide-complex tachycardia status post DCCV x 1 with return of NSR.  LHC was done for evaluation of chest pain and showed widely patent left circumflex stent with stable moderate proximal LAD stenosis and no evidence of obstructive disease.  Patient was discharged on metoprolol  25 mg twice daily.  TSH markedly low with mildly elevated T4 during this admission and dose of levothyroxine  was reduced to 100 mcg daily.  Blood pressure was well-controlled during this admission on metoprolol  and patient's other antihypertensive medications including amlodipine , Lasix , hydrochlorothiazide , and losartan  were discontinued.  Patient presents to the ED today for evaluation of shortness of breath.  She had an outpatient echocardiogram done today which revealed new onset heart failure with EF of 25 to 30%, grade 2 diastolic dysfunction, and mild mitral regurgitation.  Oxygen saturation in the 80s on room air with EMS and was placed on supplemental oxygen.  Tachycardic and tachypneic on arrival to the ED.  Labs notable for bicarb 19, anion gap 13, glucose 271, creatinine 1.2 (stable), troponin 79> 211, BNP 1135, VBG with pH 7.27 and pCO2 44.8.  Chest x-ray showing cardiomegaly with vascular congestion, edema, and suspected small bilateral pleural effusions.  Also showing bibasilar densities suspicious for atelectasis versus pneumonia.  EKG without acute ischemic changes.  Patient was given IV Lasix  40 mg.  Cardiology consulted and [facility] called to  admit.  Patient is reporting increasing shortness of breath for the past few days.  Symptoms became worse today prompting her to come to the ED to be evaluated.  Also endorsing cough and bilateral lower extremity edema.  She has been using albuterol  inhaler at home but it has not helped.  States her cardiologist had ordered an echocardiogram which was done earlier today.  Denies chest pain.  No other complaints.  Review of Systems:  Review of Systems  All other systems reviewed and are negative.   Past Medical History:  Diagnosis Date   Arthritis    Atrial fibrillation with RVR (HCC) 09/21/2014   Bronchitis    h/o   Diabetes mellitus    NO MEDS,  DIET CONTROLLED   History of migraines    "when working under alot of stress"   Hypertension    Hypothyroid    LBBB (left bundle branch block) 09/22/2014   OSA (obstructive sleep apnea) 04/09/2015   does not use cpap   PONV (postoperative nausea and vomiting)    PT DOESNOT WANT TO TAKE HER ATENOLOL      DOS.   " IT DROPS MY BP TO LOW"    Past Surgical History:  Procedure Laterality Date   ABDOMINAL HYSTERECTOMY  03/2001   APPENDECTOMY     BACK SURGERY     CATARACT EXTRACTION, BILATERAL     WITH IMPLANTS   CERVICAL FUSION  08/2003   CHOLECYSTECTOMY  03/2010   COLONOSCOPY WITH PROPOFOL  N/A 08/27/2023   Procedure: COLONOSCOPY WITH PROPOFOL ;  Surgeon: Alvis Jourdain, MD;  Location: [facility] ENDOSCOPY;  Service: Gastroenterology;  Laterality: N/A;   CORONARY STENT INTERVENTION  N/A 04/23/2023   Procedure: CORONARY STENT INTERVENTION;  Surgeon: Arnoldo Lapping, MD;  Location: [facility] INVASIVE CV LAB;  Service: Cardiovascular;  Laterality: N/A;   ESOPHAGOGASTRODUODENOSCOPY N/A 08/27/2023   Procedure: ESOPHAGOGASTRODUODENOSCOPY (EGD);  Surgeon: Alvis Jourdain, MD;  Location: [facility];  Service: Gastroenterology;  Laterality: N/A;   EYE SURGERY     GIVENS CAPSULE STUDY N/A 11/18/2023   Procedure: GIVENS CAPSULE STUDY;  Surgeon: Alvis Jourdain, MD;   Location: [facility] ENDOSCOPY;  Service: Gastroenterology;  Laterality: N/A;   LEFT HEART CATH AND CORONARY ANGIOGRAPHY N/A 04/23/2023   Procedure: LEFT HEART CATH AND CORONARY ANGIOGRAPHY;  Surgeon: Arnoldo Lapping, MD;  Location: [facility] INVASIVE CV LAB;  Service: Cardiovascular;  Laterality: N/A;   LEFT HEART CATH AND CORONARY ANGIOGRAPHY N/A 05/11/2024   Procedure: LEFT HEART CATH AND CORONARY ANGIOGRAPHY;  Surgeon: Wenona Hamilton, MD;  Location: [facility] INVASIVE CV LAB;  Service: Cardiovascular;  Laterality: N/A;   LUMBAR DISC SURGERY     LUMBAR LAMINECTOMY/DECOMPRESSION MICRODISCECTOMY  10/2010   POSTERIOR FUSION LUMBAR SPINE  12/16/11; 12/2005; 07/2004   L2-3; L3-4; L4-5   REVERSE SHOULDER ARTHROPLASTY Right 02/26/2022   Procedure: REVERSE SHOULDER ARTHROPLASTY;  Surgeon: Sammye Cristal, MD;  Location: [facility] ORS;  Service: Orthopedics;  Laterality: Right;   TOTAL KNEE ARTHROPLASTY Right 03/18/2017   Procedure: RIGHT TOTAL KNEE ARTHROPLASTY;  Surgeon: Genevie Kerns, MD;  Location: [facility] ORS;  Service: Orthopedics;  Laterality: Right;     reports that she has never smoked. She has never used smokeless tobacco. She reports that she does not drink alcohol and does not use drugs.  Allergies  Allergen Reactions   Oxycodone Other (See Comments)    Severe headaches, hallucinations    Codeine Other (See Comments)    Severe Headaches with codeine; but not with other opioids    Family History  Adopted: Yes  Family history unknown: Yes    Prior to Admission medications   Medication Sig Start Date End Date Taking? Authorizing Provider  albuterol  (VENTOLIN  HFA) 108 (90 Base) MCG/ACT inhaler Inhale 2 puffs into the lungs every 6 (six) hours as needed for wheezing or shortness of breath (Cough). 11/21/22   Eloise Hake Scales, PA-C  apixaban  (ELIQUIS ) 5 MG TABS tablet Take 1 tablet (5 mg total) by mouth 2 (two) times daily. 05/12/24   Jodeane Mulligan, DO  aspirin  EC 81 MG tablet Take 81 mg by mouth daily.  09/29/23   [provider]  Cholecalciferol  (VITAMIN D3) 1000 UNITS CAPS Take 1,000 Units by mouth in the morning.    [provider]  cyanocobalamin (FINEST NUTRITION VITAMIN B-12) 500 MCG tablet  01/03/24   [provider]  cyanocobalamin (VITAMIN B12) 500 MCG tablet Take 500 mcg by mouth daily.    [provider]  latanoprost  (XALATAN ) 0.005 % ophthalmic solution Place 1 drop into both eyes daily at 12 noon. 04/11/23   [provider]  levothyroxine  (SYNTHROID ) 100 MCG tablet Take 1 tablet (100 mcg total) by mouth daily at 6 (six) AM. 05/13/24   Macarthur Savory, Rudean Corrente, DO  metFORMIN  (GLUCOPHAGE ) 1000 MG tablet Take 1,000 mg by mouth 2 (two) times daily.    [provider]  metoprolol  tartrate (LOPRESSOR ) 25 MG tablet Take 1 tablet (25 mg total) by mouth 2 (two) times daily. Hold mediation if SBP < 100 or if heart rate < 60. 05/12/24   Jodeane Mulligan, DO  pregabalin  (LYRICA ) 75 MG capsule Take 75 mg by mouth 2 (two) times daily.  [provider]  rosuvastatin  (CRESTOR ) 5 MG tablet Take 5 mg by mouth 2 (two) times a week. Tuesdays & Thursdays.    [provider]    Physical Exam: Vitals:   06/02/24 2330 06/02/24 2345 06/03/24 0000 06/03/24 0015  BP:      Pulse: 81 78 75 76  Resp: (!) 25 (!) 22 17 18   Temp:      TempSrc:      SpO2: 98% 97% 98% 98%  Weight:      Height:        Physical Exam Vitals reviewed.  Constitutional:      General: She is not in acute distress. HENT:     Head: Normocephalic and atraumatic.  Eyes:     Extraocular Movements: Extraocular movements intact.  Cardiovascular:     Rate and Rhythm: Normal rate and regular rhythm.     Pulses: Normal pulses.  Pulmonary:     Effort: Pulmonary effort is normal. No respiratory distress.     Breath sounds: Rales present. No wheezing.  Abdominal:     General: Bowel sounds are normal. There is no distension.     Palpations: Abdomen is soft.     Tenderness:  There is no abdominal tenderness. There is no guarding.  Musculoskeletal:     Cervical back: Normal range of motion.     Right lower leg: Edema present.     Left lower leg: Edema present.     Comments: 1+ pitting edema of bilateral lower extremities  Skin:    General: Skin is warm and dry.  Neurological:     General: No focal deficit present.     Mental Status: She is alert and oriented to person, place, and time.     Labs on Admission: I have personally reviewed following labs and imaging studies  CBC: Recent Labs  Lab 06/02/24 1936 06/02/24 2006  WBC 9.9  --   NEUTROABS 7.7  --   HGB 12.1 12.2  HCT 38.4 36.0  MCV 87.1  --   PLT 202  --    Basic Metabolic Panel: Recent Labs  Lab 06/02/24 1936 06/02/24 2006  NA 138 140  K 3.6 3.4*  CL 106  --   CO2 19*  --   GLUCOSE 271*  --   BUN 19  --   CREATININE 1.29*  --   CALCIUM  9.1  --   MG 2.0  --    GFR: Estimated Creatinine Clearance: 37.6 mL/min (A) (by C-G formula based on SCr of 1.29 mg/dL (H)). Liver Function Tests: Recent Labs  Lab 06/02/24 1936  AST 23  ALT 16  ALKPHOS 73  BILITOT 1.0  PROT 6.5  ALBUMIN  3.7   No results for input(s): "LIPASE", "AMYLASE" in the last 168 hours. No results for input(s): "AMMONIA" in the last 168 hours. Coagulation Profile: No results for input(s): "INR", "PROTIME" in the last 168 hours. Cardiac Enzymes: No results for input(s): "CKTOTAL", "CKMB", "CKMBINDEX", "TROPONINI" in the last 168 hours. BNP (last 3 results) No results for input(s): "PROBNP" in the last 8760 hours. HbA1C: No results for input(s): "HGBA1C" in the last 72 hours. CBG: No results for input(s): "GLUCAP" in the last 168 hours. Lipid Profile: No results for input(s): "CHOL", "HDL", "LDLCALC", "TRIG", "CHOLHDL", "LDLDIRECT" in the last 72 hours. Thyroid  Function Tests: No results for input(s): "TSH", "T4TOTAL", "FREET4", "T3FREE", "THYROIDAB" in the last 72 hours. Anemia Panel: No results for  input(s): "VITAMINB12", "FOLATE", "FERRITIN", "TIBC", "IRON", "RETICCTPCT" in  the last 72 hours. Urine analysis:    Component Value Date/Time   COLORURINE YELLOW 03/15/2017 1140   APPEARANCEUR CLEAR 03/15/2017 1140   LABSPEC 1.010 03/15/2017 1140   PHURINE 7.0 03/15/2017 1140   GLUCOSEU 50 (A) 03/15/2017 1140   HGBUR NEGATIVE 03/15/2017 1140   BILIRUBINUR NEGATIVE 03/15/2017 1140   KETONESUR NEGATIVE 03/15/2017 1140   PROTEINUR NEGATIVE 03/15/2017 1140   UROBILINOGEN 0.2 09/21/2014 1836   NITRITE NEGATIVE 03/15/2017 1140   LEUKOCYTESUR NEGATIVE 03/15/2017 1140    Radiological Exams on Admission: DG Chest Portable 1 View Result Date: 06/02/2024 CLINICAL DATA:  Shortness of breath. EXAM: PORTABLE CHEST 1 VIEW COMPARISON:  Chest radiograph dated 05/09/2024. FINDINGS: There is mild cardiomegaly with vascular congestion and edema. Bibasilar densities may represent atelectasis or pneumonia. Small bilateral pleural effusions suspected. No pneumothorax. Right shoulder arthroplasty. No acute osseous pathology. IMPRESSION: 1. Cardiomegaly with vascular congestion and edema. 2. Bibasilar atelectasis or pneumonia. Electronically Signed   By: Angus Bark M.D.   On: 06/02/2024 19:53   ECHOCARDIOGRAM COMPLETE Result Date: 06/02/2024    ECHOCARDIOGRAM REPORT   Patient Name:   Destiny Brown Date of Exam: 06/02/2024 Medical Rec #:  [id number]        Height:       65.0 in Accession #:    [id number]       Weight:       186.5 lb Date of Birth:  09-20-1946        BSA:          1.920 m Patient Age:    78 years         BP:           186/109 mmHg Patient Gender: F                HR:           58 bpm. Exam Location:  [address] Procedure: 2D Echo, Cardiac Doppler and Color Doppler (Both Spectral and Color            Flow Doppler were utilized during procedure). Indications:    R06.00 Dyspnea  History:        Patient has prior history of Echocardiogram examinations, most                 recent 02/24/2023. CAD, s/p  LCX DES, Arrythmias:LBBB,                 Signs/Symptoms:Shortness of Breath, Dyspnea and Edema; Risk                 Factors:Hypertension and Obesity. Previouse echo LVEF 60% mild                 LAE.  Sonographer:    Donnita Gales [facility], RDCS Referring Phys: [id number] RENEE LYNN URSUY  Sonographer Comments: Notified DOD Dr. Julane Ny IMPRESSIONS  1. There is an L wave in the mitral inflow pattern suggesting significant diastolic dysfunction and increased filling pressures. Left ventricular ejection fraction, by estimation, is 25 to 30%. The left ventricle has severely decreased function. The left ventricle demonstrates global hypokinesis. There is mild concentric left ventricular hypertrophy. Left ventricular diastolic parameters are consistent with Grade II diastolic dysfunction (pseudonormalization).  2. Right ventricular systolic function is mildly reduced. The right ventricular size is normal. There is mildly elevated pulmonary artery systolic pressure. The estimated right ventricular systolic pressure is 36.9 mmHg.  3. Left atrial size was moderately dilated.  4. The mitral valve  is normal in structure. Mild mitral valve regurgitation. No evidence of mitral stenosis.  5. The aortic valve is tricuspid. There is mild calcification of the aortic valve. Aortic valve regurgitation is not visualized. Aortic valve sclerosis/calcification is present, without any evidence of aortic stenosis. Conclusion(s)/Recommendation(s): Severe global LV dysfunction new since previous. Doppler parameters suggestive of high left-side filling pressures. FINDINGS  Left Ventricle: There is an L wave in the mitral inflow pattern suggesting significant diastolic dysfunction and increased filling pressures. Left ventricular ejection fraction, by estimation, is 25 to 30%. The left ventricle has severely decreased function. The left ventricle demonstrates global hypokinesis. The left ventricular internal cavity size was normal in size. There is  mild concentric left ventricular hypertrophy. Left ventricular diastolic parameters are consistent with Grade II diastolic dysfunction (pseudonormalization). Right Ventricle: The right ventricular size is normal. No increase in right ventricular wall thickness. Right ventricular systolic function is mildly reduced. There is mildly elevated pulmonary artery systolic pressure. The tricuspid regurgitant velocity  is 2.69 m/s, and with an assumed right atrial pressure of 8 mmHg, the estimated right ventricular systolic pressure is 36.9 mmHg. Left Atrium: Left atrial size was moderately dilated. Right Atrium: Right atrial size was normal in size. Pericardium: There is no evidence of pericardial effusion. Mitral Valve: The mitral valve is normal in structure. Mild mitral valve regurgitation. No evidence of mitral valve stenosis. Tricuspid Valve: The tricuspid valve is normal in structure. Tricuspid valve regurgitation is trivial. No evidence of tricuspid stenosis. Aortic Valve: The aortic valve is tricuspid. There is mild calcification of the aortic valve. Aortic valve regurgitation is not visualized. Aortic valve sclerosis/calcification is present, without any evidence of aortic stenosis. Pulmonic Valve: The pulmonic valve was normal in structure. Pulmonic valve regurgitation is not visualized. No evidence of pulmonic stenosis. Aorta: The aortic root is normal in size and structure. IAS/Shunts: No atrial level shunt detected by color flow Doppler.  LEFT VENTRICLE PLAX 2D LVIDd:         5.00 cm   Diastology LVIDs:         4.00 cm   LV e' medial:    3.92 cm/s LV PW:         0.80 cm   LV E/e' medial:  23.1 LV IVS:        1.20 cm   LV e' lateral:   4.57 cm/s LVOT diam:     2.00 cm   LV E/e' lateral: 19.8 LV SV:         63 LV SV Index:   33 LVOT Area:     3.14 cm  RIGHT VENTRICLE            IVC RV Basal diam:  3.20 cm    IVC diam: 2.50 cm RV Mid diam:    2.10 cm RV S prime:     9.14 cm/s TAPSE (M-mode): 2.1 cm RVSP:            36.9 mmHg LEFT ATRIUM             Index        RIGHT ATRIUM           Index LA diam:        4.90 cm 2.55 cm/m   RA Pressure: 8.00 mmHg LA Vol (A2C):   65.9 ml 34.32 ml/m  RA Area:     10.10 cm LA Vol (A4C):   35.6 ml 18.54 ml/m  RA Volume:   20.80 ml  10.83 ml/m  LA Biplane Vol: 48.6 ml 25.31 ml/m  AORTIC VALVE LVOT Vmax:   79.70 cm/s LVOT Vmean:  51.900 cm/s LVOT VTI:    0.200 m  AORTA Ao Root diam: 2.70 cm Ao Asc diam:  2.70 cm MITRAL VALVE               TRICUSPID VALVE MV Area (PHT): 3.03 cm    TR Peak grad:   28.9 mmHg MV Decel Time: 250 msec    TR Vmax:        269.00 cm/s MV E velocity: 90.70 cm/s  Estimated RAP:  8.00 mmHg MV A velocity: 73.40 cm/s  RVSP:           36.9 mmHg MV E/A ratio:  1.24                            SHUNTS                            Systemic VTI:  0.20 m                            Systemic Diam: 2.00 cm Jules Oar MD Electronically signed by Jules Oar MD Signature Date/Time: 06/02/2024/10:09:39 AM    Final     EKG: Independently reviewed.  Sinus rhythm, LAFB, LBBB, QTc 518.  No acute ischemic changes.  Assessment and Plan  Acute hypoxemic respiratory failure secondary to acute HFrEF Patient is presenting with shortness of breath and bilateral lower extremity edema.  Oxygen saturation in the 80s on room air, currently stable on 2 L [place].  She had an outpatient echocardiogram done today which revealed new onset heart failure with EF of 25 to 30%, grade 2 diastolic dysfunction, and mild mitral regurgitation.  BNP 1135. Chest x-ray showing cardiomegaly with vascular congestion, edema, and suspected small bilateral pleural effusions.  Also showing bibasilar densities likely due to atelectasis.  Pneumonia less likely given no fever or leukocytosis.  Patient was given IV Lasix  40 mg in the ED.  Cardiology consulted.  Further diuretics per cardiology recommendations.  Monitor intake and output, daily weights, renal function, and electrolytes.  Low-sodium diet with fluid  restriction.  Check procalcitonin level.  Continue supplemental oxygen, wean as tolerated.  Elevated troponin CAD status post PCI to LCx 03/2023 Elevated troponin likely due to demand ischemia rather than ACS.  Troponin 79> 211.  EKG without acute ischemic changes and patient is not endorsing chest pain.  Recent LHC showed widely patent left circumflex stent with stable moderate proximal LAD stenosis and no evidence of obstructive disease.  Trend troponin.  Cardiology consulted.  QT prolongation Monitor potassium and magnesium  levels, replace as needed.  Avoid QT prolonging drugs.  Paroxysmal atrial fibrillation Currently in sinus rhythm.  Continue Eliquis  and metoprolol .  Type 2 diabetes A1c 6.6 on 05/09/2024.  Placed on sensitive sliding scale insulin  ACHS.  Hypertension IV Lasix  given and continue metoprolol .  Hyperlipidemia Continue Crestor .  CKD stage IIIa Creatinine stable, monitor renal function.  Hypothyroidism Continue Synthroid .  Chronic pain/neuropathy Continue Lyrica .  DVT prophylaxis: Eliquis  Code Status: Full Code (discussed with the patient) Consults called: Cardiology Level of care: Progressive Care Unit Admission status: It is my clinical opinion that admission to INPATIENT is reasonable and necessary because of the expectation that this patient will require hospital care that crosses at least 2 midnights to  treat this condition based on the medical complexity of the problems presented.  Given the aforementioned information, the predictability of an adverse outcome is felt to be significant.  Juliette Oh MD [facility]  If 7PM-7AM, please contact night-coverage [email or web address]  06/03/2024, 12:39 AM

## 2024-06-03 NOTE — ED Notes (Signed)
 Cardiology returned this RN page, they were made aware of pt trop

## 2024-06-03 NOTE — Progress Notes (Signed)
 Although patient is not having chest pain, troponin is rising (79> 211> 292).  Cardiology recommending starting IV heparin .  Continue to trend troponin.

## 2024-06-04 ENCOUNTER — Inpatient Hospital Stay (HOSPITAL_COMMUNITY)

## 2024-06-04 DIAGNOSIS — I5043 Acute on chronic combined systolic (congestive) and diastolic (congestive) heart failure: Secondary | ICD-10-CM

## 2024-06-04 DIAGNOSIS — I509 Heart failure, unspecified: Secondary | ICD-10-CM | POA: Diagnosis not present

## 2024-06-04 LAB — CBC
HCT: 36.5 % (ref 36.0–46.0)
Hemoglobin: 11.5 g/dL — ABNORMAL LOW (ref 12.0–15.0)
MCH: 26.7 pg (ref 26.0–34.0)
MCHC: 31.5 g/dL (ref 30.0–36.0)
MCV: 84.9 fL (ref 80.0–100.0)
Platelets: 239 10*3/uL (ref 150–400)
RBC: 4.3 MIL/uL (ref 3.87–5.11)
RDW: 14.9 % (ref 11.5–15.5)
WBC: 13.3 10*3/uL — ABNORMAL HIGH (ref 4.0–10.5)
nRBC: 0 % (ref 0.0–0.2)

## 2024-06-04 LAB — APTT
aPTT: 57 s — ABNORMAL HIGH (ref 24–36)
aPTT: 69 s — ABNORMAL HIGH (ref 24–36)

## 2024-06-04 LAB — HEPARIN LEVEL (UNFRACTIONATED): Heparin Unfractionated: >1.1 [IU]/mL — ABNORMAL HIGH (ref 0.30–0.70)

## 2024-06-04 LAB — BASIC METABOLIC PANEL WITH GFR
Anion gap: 11 (ref 5–15)
BUN: 30 mg/dL — ABNORMAL HIGH (ref 8–23)
CO2: 21 mmol/L — ABNORMAL LOW (ref 22–32)
Calcium: 9.7 mg/dL (ref 8.9–10.3)
Chloride: 103 mmol/L (ref 98–111)
Creatinine, Ser: 1.58 mg/dL — ABNORMAL HIGH (ref 0.44–1.00)
GFR, Estimated: 33 mL/min — ABNORMAL LOW (ref >60)
Glucose, Bld: 170 mg/dL — ABNORMAL HIGH (ref 70–99)
Potassium: 4.6 mmol/L (ref 3.5–5.1)
Sodium: 135 mmol/L (ref 135–145)

## 2024-06-04 LAB — GLUCOSE, CAPILLARY
Glucose-Capillary: 169 mg/dL — ABNORMAL HIGH (ref 70–99)
Glucose-Capillary: 178 mg/dL — ABNORMAL HIGH (ref 70–99)
Glucose-Capillary: 121 mg/dL — ABNORMAL HIGH (ref 70–99)
Glucose-Capillary: 148 mg/dL — ABNORMAL HIGH (ref 70–99)

## 2024-06-04 MED ORDER — SPIRONOLACTONE 12.5 MG HALF TABLET
12.5000 mg | ORAL_TABLET | Freq: Every day | ORAL | Status: DC
  Administered 2024-06-04: 12.5 mg via ORAL
  Filled 2024-06-04: qty 1

## 2024-06-04 MED ORDER — APIXABAN 5 MG PO TABS
5.0000 mg | ORAL_TABLET | Freq: Two times a day (BID) | ORAL | Status: DC
  Administered 2024-06-04 – 2024-06-10 (×13): 5 mg via ORAL
  Filled 2024-06-04 (×13): qty 1

## 2024-06-04 MED ORDER — GUAIFENESIN 100 MG/5ML PO LIQD
5.0000 mL | ORAL | Status: DC | PRN
  Administered 2024-06-04: 5 mL via ORAL
  Filled 2024-06-04: qty 10

## 2024-06-04 MED ORDER — FUROSEMIDE 10 MG/ML IJ SOLN
40.0000 mg | Freq: Once | INTRAMUSCULAR | Status: AC
  Administered 2024-06-04: 40 mg via INTRAVENOUS

## 2024-06-04 MED ORDER — CARMEX CLASSIC LIP BALM EX OINT
TOPICAL_OINTMENT | CUTANEOUS | Status: DC | PRN
  Administered 2024-06-04: 1 via TOPICAL
  Filled 2024-06-04: qty 10

## 2024-06-04 MED ORDER — AMIODARONE HCL 200 MG PO TABS
200.0000 mg | ORAL_TABLET | Freq: Two times a day (BID) | ORAL | Status: DC
  Administered 2024-06-04 (×2): 200 mg via ORAL
  Filled 2024-06-04 (×2): qty 1

## 2024-06-04 MED ORDER — AMIODARONE IV BOLUS ONLY 150 MG/100ML
150.0000 mg | Freq: Once | INTRAVENOUS | Status: AC
  Administered 2024-06-04: 150 mg via INTRAVENOUS
  Filled 2024-06-04: qty 100

## 2024-06-04 MED ORDER — HEPARIN BOLUS VIA INFUSION
1000.0000 [IU] | Freq: Once | INTRAVENOUS | Status: AC
  Administered 2024-06-04: 1000 [IU] via INTRAVENOUS
  Filled 2024-06-04: qty 1000

## 2024-06-04 MED ORDER — METOPROLOL TARTRATE 12.5 MG HALF TABLET
12.5000 mg | ORAL_TABLET | Freq: Two times a day (BID) | ORAL | Status: DC
  Administered 2024-06-04 – 2024-06-05 (×3): 12.5 mg via ORAL
  Filled 2024-06-04 (×4): qty 1

## 2024-06-04 MED ORDER — SACUBITRIL-VALSARTAN 24-26 MG PO TABS
1.0000 | ORAL_TABLET | Freq: Two times a day (BID) | ORAL | Status: DC
  Administered 2024-06-04 (×2): 1 via ORAL
  Filled 2024-06-04 (×2): qty 1

## 2024-06-04 NOTE — Progress Notes (Signed)
 Patient heart rate 45 sinus brady since start of shift - spoke with cards to hold metoprolol .  Patient sat up to drink water  and went back into afib heart rate as high as 160.   BP 120/89.  Dr. Sanjuana Crutch notified and awaiting further instructions, pt remains asymptomatic.

## 2024-06-04 NOTE — Progress Notes (Signed)
 PROGRESS NOTE    Destiny Brown  JXB:[id number] DOB: 04/24/1946 DOA: 06/02/2024 PCP: Rosslyn Coons, MD    78/F w paroxysmal A-fib on Eliquis , CAD status  PCI to LCx 03/2023, LBBB, type 2 diabetes, hypertension, obesity, RA not on DMARD, CKD stage IIIa, hypothyroidism, OSA not on CPAP.  Recently admitted 5/13-5/16 for A-fib with RVR and wide-complex tachycardia status post DCCV x 1 with return of NSR.  LHC >showed widely patent left circumflex stent with stable moderate proximal LAD stenosis and no evidence of obstructive disease.  Patient was discharged on metoprolol  25 mg twice daily.  patient's other antihypertensive medications including amlodipine , Lasix , hydrochlorothiazide , and losartan  were discontinued. - Had an outpatient echo which noted EF of 25-30%, grade 2 DD, mild MR -Presented to the ED 6/6 with worsening dyspnea, some edema -In the ED tachypneic tachycardic, hypoxic, creatinine 1.2, troponin 79, 221, BNP 1135, chest x-ray with cardiomegaly pulmonary vascular congestion edema and effusions  Subjective: Over night with respiratory distress  Assessment and Plan:  Acute systolic CHF, new diagnosis Acute hypoxemic respiratory failure -Recent 2D echo PTA noted EF 25-30%, grade 2 DD, mild MR - Continue IV Lasix  today, urine output is inaccurate,cards following - Continue Aldactone, cards starting Entresto today -May not be a good candidate for SGLT2i - Suspect cardiomyopathy is secondary to recent A-fib RVR, LHC in May noted stable CAD -PT OT, patient indicates desire for short-term rehab  Atrial fibrillation with RVR - Converted to sinus rhythm yesterday , remains on IV Amio, plan to transition to p.o.  - Change heparin  to Eliquis   - Continue metoprolol    Elevated troponin CAD status post PCI to LCx 03/2023 Elevated troponin likely due to demand ischemia rather than ACS.  Troponin 211, 272 - Recent LHC showed widely patent left circumflex stent with stable moderate proximal  LAD stenosis and no evidence of obstructive disease.   QT prolongation Monitor electrolytes  OSA/OHS -Unfortunately unable to tolerate CPAP   Type 2 diabetes A1c 6.6 on 05/09/2024.  Placed on sensitive sliding scale insulin  ACHS.   Hypertension IV Lasix  given and continue metoprolol .   Hyperlipidemia Continue Crestor .   CKD stage IIIa Creatinine stable, monitor renal function.   Hypothyroidism Continue Synthroid .   Chronic pain/neuropathy Continue Lyrica .   DVT prophylaxis: Eliquis  Code Status: Full Code (discussed with the patient) Disposition Plan: PT OT, TBD    Objective: Vitals:   06/04/24 0631 06/04/24 0721 06/04/24 0821 06/04/24 0934  BP: (!) 194/96 (!) 167/91 (!) 184/76   Pulse:  (!) 58 (!) 59 63  Resp:   (!) 21   Temp:   98.1 F (36.7 C)   TempSrc:   Oral   SpO2:  93% 97% 97%  Weight:      Height:        Intake/Output Summary (Last 24 hours) at 06/04/2024 1043 Last data filed at 06/04/2024 1308 Gross per 24 hour  Intake 1065.06 ml  Output 2275 ml  Net -1209.94 ml   Filed Weights   06/02/24 1936 06/03/24 1553 06/04/24 0606  Weight: 83 kg 87.7 kg 87.2 kg    Examination:  General exam: Appears calm and comfortable obese, AAO x 3 mild distress HEENT: Positive JVD Respiratory system: Few basilar rales otherwise clear CVS: S1-S2, regular rhythm now Abdomen: Soft, nontender, bowel sounds present  EXTR: 1-2+ edema Skin: No rashes Psychiatry:  Mood & affect appropriate.     Data Reviewed:   CBC: Recent Labs  Lab 06/02/24 1936 06/02/24 2006 06/04/24 6578  WBC 9.9  --  13.3*  NEUTROABS 7.7  --   --   HGB 12.1 12.2 11.5*  HCT 38.4 36.0 36.5  MCV 87.1  --  84.9  PLT 202  --  239   Basic Metabolic Panel: Recent Labs  Lab 06/02/24 1936 06/02/24 2006 06/03/24 0204 06/04/24 0353  NA 138 140 138 135  K 3.6 3.4* 3.4* 4.6  CL 106  --  105 103  CO2 19*  --  17* 21*  GLUCOSE 271*  --  298* 170*  BUN 19  --  19 30*  CREATININE 1.29*  --   1.44* 1.58*  CALCIUM  9.1  --  9.3 9.7  MG 2.0  --  1.6*  --    GFR: Estimated Creatinine Clearance: 31.5 mL/min (A) (by C-G formula based on SCr of 1.58 mg/dL (H)). Liver Function Tests: Recent Labs  Lab 06/02/24 1936  AST 23  ALT 16  ALKPHOS 73  BILITOT 1.0  PROT 6.5  ALBUMIN  3.7   No results for input(s): "LIPASE", "AMYLASE" in the last 168 hours. No results for input(s): "AMMONIA" in the last 168 hours. Coagulation Profile: No results for input(s): "INR", "PROTIME" in the last 168 hours. Cardiac Enzymes: No results for input(s): "CKTOTAL", "CKMB", "CKMBINDEX", "TROPONINI" in the last 168 hours. BNP (last 3 results) No results for input(s): "PROBNP" in the last 8760 hours. HbA1C: No results for input(s): "HGBA1C" in the last 72 hours. CBG: Recent Labs  Lab 06/03/24 1738 06/03/24 2139 06/04/24 0839  GLUCAP 107* 173* 169*   Lipid Profile: No results for input(s): "CHOL", "HDL", "LDLCALC", "TRIG", "CHOLHDL", "LDLDIRECT" in the last 72 hours. Thyroid  Function Tests: No results for input(s): "TSH", "T4TOTAL", "FREET4", "T3FREE", "THYROIDAB" in the last 72 hours. Anemia Panel: No results for input(s): "VITAMINB12", "FOLATE", "FERRITIN", "TIBC", "IRON", "RETICCTPCT" in the last 72 hours. Urine analysis:    Component Value Date/Time   COLORURINE YELLOW 03/15/2017 1140   APPEARANCEUR CLEAR 03/15/2017 1140   LABSPEC 1.010 03/15/2017 1140   PHURINE 7.0 03/15/2017 1140   GLUCOSEU 50 (A) 03/15/2017 1140   HGBUR NEGATIVE 03/15/2017 1140   BILIRUBINUR NEGATIVE 03/15/2017 1140   KETONESUR NEGATIVE 03/15/2017 1140   PROTEINUR NEGATIVE 03/15/2017 1140   UROBILINOGEN 0.2 09/21/2014 1836   NITRITE NEGATIVE 03/15/2017 1140   LEUKOCYTESUR NEGATIVE 03/15/2017 1140   Sepsis Labs: @LABRCNTIP (procalcitonin:4,lacticidven:4)  )No results found for this or any previous visit (from the past 240 hours).   Radiology Studies: DG Chest Port 1 View Result Date: 06/04/2024 CLINICAL DATA:   Shortness of breath. EXAM: PORTABLE CHEST 1 VIEW COMPARISON:  06/02/2024 FINDINGS: Stable cardiac enlargement. Aortic atherosclerosis. Interval improvement in bilateral pleural effusions and interstitial edema. Decreased atelectasis noted in the left base. No new findings. Dorsal column stimulator within the thoracic canal. Signs of previous ACDF within the cervical spine. Right shoulder arthroplasty. IMPRESSION: 1. Interval improvement in bilateral pleural effusions and interstitial edema. 2. Decreased left base atelectasis. Electronically Signed   By: Kimberley Penman M.D.   On: 06/04/2024 08:17   DG Chest Portable 1 View Result Date: 06/02/2024 CLINICAL DATA:  Shortness of breath. EXAM: PORTABLE CHEST 1 VIEW COMPARISON:  Chest radiograph dated 05/09/2024. FINDINGS: There is mild cardiomegaly with vascular congestion and edema. Bibasilar densities may represent atelectasis or pneumonia. Small bilateral pleural effusions suspected. No pneumothorax. Right shoulder arthroplasty. No acute osseous pathology. IMPRESSION: 1. Cardiomegaly with vascular congestion and edema. 2. Bibasilar atelectasis or pneumonia. Electronically Signed   By: Marene Shape.D.  On: 06/02/2024 19:53     Scheduled Meds:  amiodarone  200 mg Oral BID   apixaban   5 mg Oral BID   furosemide   40 mg Intravenous BID   insulin  aspart  0-5 Units Subcutaneous QHS   insulin  aspart  0-9 Units Subcutaneous TID WC   latanoprost   1 drop Both Eyes QHS   levothyroxine   100 mcg Oral Q0600   metoprolol  tartrate  12.5 mg Oral BID   pneumococcal 20-valent conjugate vaccine  0.5 mL Intramuscular Tomorrow-1000   pregabalin   75 mg Oral BID   [START ON 06/05/2024] rosuvastatin   5 mg Oral Once per day on Monday Thursday   sacubitril-valsartan  1 tablet Oral BID   spironolactone  12.5 mg Oral Daily   Continuous Infusions:     LOS: 2 days    Time spent:    Deforest Fast, MD [facility]   06/04/2024, 10:43 AM

## 2024-06-04 NOTE — Significant Event (Signed)
 Patient's nurse notified me that patient was getting more short of breath.  On exam at bedside patient is tachypneic but not in distress.  I reviewed patient's medications labs and notes.  Blood pressure is 190/90 pulse is 60/min respiration 20/min O2 sat 92% on 3 L oxygen.  HEENT anicteric no pallor. Heart S1-S2 heard. Chest bilateral entry present. Abdomen soft nontender bowel sound present.  Assessment and plan -   Acute respiratory failure hypoxia presently on 3 L oxygen secondary to acute CHF exacerbation.  I have ordered 80 mg IV Lasix  will check chest x-ray.  Will discuss with oncoming hospitalist for follow-up.  Melford Spotted.

## 2024-06-04 NOTE — Progress Notes (Signed)
 ANTICOAGULATION CONSULT NOTE  Pharmacy Consult for Heparin  Indication: chest pain/ACS  Allergies  Allergen Reactions   Oxycodone Other (See Comments)    Severe headaches, hallucinations    Codeine Other (See Comments)    Severe Headaches with codeine; but not with other opioids    Patient Measurements: Height: 5\' 5"  (165.1 cm) Weight: 87.7 kg (193 lb 6.4 oz) IBW/kg (Calculated) : 57 Heparin  Dosing Weight: 74.8 kg  Vital Signs: Temp: 98 F (36.7 C) (06/07 2005) Temp Source: Oral (06/07 2005) BP: 156/63 (06/08 0018) Pulse Rate: 56 (06/08 0018)  Labs: Recent Labs    06/02/24 1936 06/02/24 2006 06/02/24 2317 06/03/24 0204 06/03/24 8657 06/03/24 1417 06/04/24 0022  HGB 12.1 12.2  --   --   --   --   --   HCT 38.4 36.0  --   --   --   --   --   PLT 202  --   --   --   --   --   --   APTT  --   --   --   --   --  65* 57*  HEPARINUNFRC  --   --   --   --   --  >1.10* >1.10*  CREATININE 1.29*  --   --  1.44*  --   --   --   TROPONINIHS 79*  --  211* 292* 272*  --   --     Estimated Creatinine Clearance: 34.7 mL/min (A) (by C-G formula based on SCr of 1.44 mg/dL (H)).   Medical History: Past Medical History:  Diagnosis Date   Arthritis    Atrial fibrillation with RVR (HCC) 09/21/2014   Bronchitis    h/o   Diabetes mellitus    NO MEDS,  DIET CONTROLLED   History of migraines    "when working under alot of stress"   Hypertension    Hypothyroid    LBBB (left bundle branch block) 09/22/2014   OSA (obstructive sleep apnea) 04/09/2015   does not use cpap   PONV (postoperative nausea and vomiting)    PT DOESNOT WANT TO TAKE HER ATENOLOL      DOS.   " IT DROPS MY BP TO LOW"   Assessment: 54 yoF presented to ED with shortness of breath. PMH includes pAF (PTA Eliquis ), CAD (PCI Lcx 2024), LBBB, T2DM, HTN, CKD Stage IIIa, and new onset HF (LVEF 25-30%). Patient with recent admission for afib RVR and LHC not shwing obstructive disease. Pharmacy consulted to dose heparin   for ACS given rising troponin (no chest pain reported)   Patient was on apixaban  prior to arrival. Last dose 6/6 1700. Utilizing aPTT monitoring due to likely falsely high anti-Xa level secondary to DOAC use.  aPTT level is 57 sub-therapeutic after rate increase and bolus. Heparin  level is > 1.1 secondary to apixaban . Will utilize aPTT for monitoring for now. Per RN, signs of bleeding, but heparin  gtt has been pausing given location > RN moving hep gtt site  Hgb 12.2; plt 202  Goal of Therapy:  Heparin  level 0.3-0.7 units/ml aPTT 66-102 seconds Monitor platelets by anticoagulation protocol: Yes   Plan:  Give another 1000 units IV heparin  bolus. Increase heparin  infusion to 1300 units/hr Check aPTT in 8 hours Continue to monitor via aPTT until levels are correlated Continue to monitor H&H and platelets  Young Hensen, PharmD, BCPS Clinical Pharmacist 06/04/2024 1:03 AM

## 2024-06-04 NOTE — Progress Notes (Addendum)
 Patient had a report of feeling hot and chest pain when heart rate peaked in the 160s,  bp during that time 91/71.  After the symptoms passed bp 107/65.  Amio bolus infusing BP 108/57, heart rate 128 currently, patient asymptomatic.

## 2024-06-04 NOTE — Progress Notes (Signed)
 Notified by RN that Destiny Brown had a HR in the 40s and she had requested for us  to hold the evening metoprolol . Shortly after this message and the decision to hold the metoprol, she went back into afib with rates of 160 with a BP of

## 2024-06-04 NOTE — Progress Notes (Signed)
 Patient with increased work of breathing this am, oxygen reapplied at 2L for sats 87-88% on room air.  Lungs with expiratory wheezes and crackles. SBP 170-180s.  Dr. [facility] Lavender notified with new orders, total of 80 mg IV lasix  given and chest xray pending.

## 2024-06-04 NOTE — Progress Notes (Signed)
 Subjective:   Still with dyspnea BP elevated   Objective:  Vitals:   06/04/24 0548 06/04/24 0606 06/04/24 0631 06/04/24 0721  BP: (!) 184/98  (!) 194/96 (!) 167/91  Pulse: 62   (!) 58  Resp:      Temp:      TempSrc:      SpO2: 91%   93%  Weight:  87.2 kg    Height:        Intake/Output from previous day:  Intake/Output Summary (Last 24 hours) at 06/04/2024 0753 Last data filed at 06/04/2024 0600 Gross per 24 hour  Intake 1065.06 ml  Output 1575 ml  Net -509.94 ml    Physical Exam:  Elderly female Mild tachypnea No murmur  Basilar crackles Abdomen benign Post right TKR Plus one edema  Lab Results: Basic Metabolic Panel: Recent Labs    06/02/24 1936 06/02/24 2006 06/03/24 0204 06/04/24 0353  NA 138   < > 138 135  K 3.6   < > 3.4* 4.6  CL 106  --  105 103  CO2 19*  --  17* 21*  GLUCOSE 271*  --  298* 170*  BUN 19  --  19 30*  CREATININE 1.29*  --  1.44* 1.58*  CALCIUM  9.1  --  9.3 9.7  MG 2.0  --  1.6*  --    < > = values in this interval not displayed.   Liver Function Tests: Recent Labs    06/02/24 1936  AST 23  ALT 16  ALKPHOS 73  BILITOT 1.0  PROT 6.5  ALBUMIN  3.7   No results for input(s): "LIPASE", "AMYLASE" in the last 72 hours. CBC: Recent Labs    06/02/24 1936 06/02/24 2006 06/04/24 0353  WBC 9.9  --  13.3*  NEUTROABS 7.7  --   --   HGB 12.1 12.2 11.5*  HCT 38.4 36.0 36.5  MCV 87.1  --  84.9  PLT 202  --  239     Imaging: DG Chest Portable 1 View Result Date: 06/02/2024 CLINICAL DATA:  Shortness of breath. EXAM: PORTABLE CHEST 1 VIEW COMPARISON:  Chest radiograph dated 05/09/2024. FINDINGS: There is mild cardiomegaly with vascular congestion and edema. Bibasilar densities may represent atelectasis or pneumonia. Small bilateral pleural effusions suspected. No pneumothorax. Right shoulder arthroplasty. No acute osseous pathology. IMPRESSION: 1. Cardiomegaly with vascular congestion and edema. 2. Bibasilar atelectasis or  pneumonia. Electronically Signed   By: Angus Bark M.D.   On: 06/02/2024 19:53   ECHOCARDIOGRAM COMPLETE Result Date: 06/02/2024    ECHOCARDIOGRAM REPORT   Patient Name:   Destiny Brown Date of Exam: 06/02/2024 Medical Rec #:  [id number]        Height:       65.0 in Accession #:    [id number]       Weight:       186.5 lb Date of Birth:  June 19, 1946        BSA:          1.920 m Patient Age:    78 years         BP:           186/109 mmHg Patient Gender: F                HR:           58 bpm. Exam Location:  [address] Procedure: 2D Echo, Cardiac Doppler and Color Doppler (Both Spectral and Color  Flow Doppler were utilized during procedure). Indications:    R06.00 Dyspnea  History:        Patient has prior history of Echocardiogram examinations, most                 recent 02/24/2023. CAD, s/p LCX DES, Arrythmias:LBBB,                 Signs/Symptoms:Shortness of Breath, Dyspnea and Edema; Risk                 Factors:Hypertension and Obesity. Previouse echo LVEF 60% mild                 LAE.  Sonographer:    Donnita Gales [facility], RDCS Referring Phys: [id number] RENEE LYNN URSUY  Sonographer Comments: Notified DOD Dr. Julane Ny IMPRESSIONS  1. There is an L wave in the mitral inflow pattern suggesting significant diastolic dysfunction and increased filling pressures. Left ventricular ejection fraction, by estimation, is 25 to 30%. The left ventricle has severely decreased function. The left ventricle demonstrates global hypokinesis. There is mild concentric left ventricular hypertrophy. Left ventricular diastolic parameters are consistent with Grade II diastolic dysfunction (pseudonormalization).  2. Right ventricular systolic function is mildly reduced. The right ventricular size is normal. There is mildly elevated pulmonary artery systolic pressure. The estimated right ventricular systolic pressure is 36.9 mmHg.  3. Left atrial size was moderately dilated.  4. The mitral valve is normal in structure. Mild  mitral valve regurgitation. No evidence of mitral stenosis.  5. The aortic valve is tricuspid. There is mild calcification of the aortic valve. Aortic valve regurgitation is not visualized. Aortic valve sclerosis/calcification is present, without any evidence of aortic stenosis. Conclusion(s)/Recommendation(s): Severe global LV dysfunction new since previous. Doppler parameters suggestive of high left-side filling pressures. FINDINGS  Left Ventricle: There is an L wave in the mitral inflow pattern suggesting significant diastolic dysfunction and increased filling pressures. Left ventricular ejection fraction, by estimation, is 25 to 30%. The left ventricle has severely decreased function. The left ventricle demonstrates global hypokinesis. The left ventricular internal cavity size was normal in size. There is mild concentric left ventricular hypertrophy. Left ventricular diastolic parameters are consistent with Grade II diastolic dysfunction (pseudonormalization). Right Ventricle: The right ventricular size is normal. No increase in right ventricular wall thickness. Right ventricular systolic function is mildly reduced. There is mildly elevated pulmonary artery systolic pressure. The tricuspid regurgitant velocity  is 2.69 m/s, and with an assumed right atrial pressure of 8 mmHg, the estimated right ventricular systolic pressure is 36.9 mmHg. Left Atrium: Left atrial size was moderately dilated. Right Atrium: Right atrial size was normal in size. Pericardium: There is no evidence of pericardial effusion. Mitral Valve: The mitral valve is normal in structure. Mild mitral valve regurgitation. No evidence of mitral valve stenosis. Tricuspid Valve: The tricuspid valve is normal in structure. Tricuspid valve regurgitation is trivial. No evidence of tricuspid stenosis. Aortic Valve: The aortic valve is tricuspid. There is mild calcification of the aortic valve. Aortic valve regurgitation is not visualized. Aortic valve  sclerosis/calcification is present, without any evidence of aortic stenosis. Pulmonic Valve: The pulmonic valve was normal in structure. Pulmonic valve regurgitation is not visualized. No evidence of pulmonic stenosis. Aorta: The aortic root is normal in size and structure. IAS/Shunts: No atrial level shunt detected by color flow Doppler.  LEFT VENTRICLE PLAX 2D LVIDd:         5.00 cm   Diastology LVIDs:  4.00 cm   LV e' medial:    3.92 cm/s LV PW:         0.80 cm   LV E/e' medial:  23.1 LV IVS:        1.20 cm   LV e' lateral:   4.57 cm/s LVOT diam:     2.00 cm   LV E/e' lateral: 19.8 LV SV:         63 LV SV Index:   33 LVOT Area:     3.14 cm  RIGHT VENTRICLE            IVC RV Basal diam:  3.20 cm    IVC diam: 2.50 cm RV Mid diam:    2.10 cm RV S prime:     9.14 cm/s TAPSE (M-mode): 2.1 cm RVSP:           36.9 mmHg LEFT ATRIUM             Index        RIGHT ATRIUM           Index LA diam:        4.90 cm 2.55 cm/m   RA Pressure: 8.00 mmHg LA Vol (A2C):   65.9 ml 34.32 ml/m  RA Area:     10.10 cm LA Vol (A4C):   35.6 ml 18.54 ml/m  RA Volume:   20.80 ml  10.83 ml/m LA Biplane Vol: 48.6 ml 25.31 ml/m  AORTIC VALVE LVOT Vmax:   79.70 cm/s LVOT Vmean:  51.900 cm/s LVOT VTI:    0.200 m  AORTA Ao Root diam: 2.70 cm Ao Asc diam:  2.70 cm MITRAL VALVE               TRICUSPID VALVE MV Area (PHT): 3.03 cm    TR Peak grad:   28.9 mmHg MV Decel Time: 250 msec    TR Vmax:        269.00 cm/s MV E velocity: 90.70 cm/s  Estimated RAP:  8.00 mmHg MV A velocity: 73.40 cm/s  RVSP:           36.9 mmHg MV E/A ratio:  1.24                            SHUNTS                            Systemic VTI:  0.20 m                            Systemic Diam: 2.00 cm Jules Oar MD Electronically signed by Jules Oar MD Signature Date/Time: 06/02/2024/10:09:39 AM    Final     Cardiac Studies:  ECG: SR LBBB   Telemetry:  NSR   Echo: EF 25-30% mild RV dysfunction no significant valve dx  Medications:    furosemide   40 mg  Intravenous BID   insulin  aspart  0-5 Units Subcutaneous QHS   insulin  aspart  0-9 Units Subcutaneous TID WC   latanoprost   1 drop Both Eyes QHS   levothyroxine   100 mcg Oral Q0600   metoprolol  tartrate  25 mg Oral BID   pneumococcal 20-valent conjugate vaccine  0.5 mL Intramuscular Tomorrow-1000   pregabalin   75 mg Oral BID   [START ON 06/05/2024] rosuvastatin   5 mg Oral Once per day on Monday Thursday  amiodarone 30 mg/hr (06/03/24 2227)   heparin  1,300 Units/hr (06/04/24 0110)    Assessment/Plan:   CHF: EF 25-30% in setting of PAF/WCT afib with aberrancy. Converted to NSR Change to oral amiodarone. Continue lopressor  and iv lasix  add aldactone Cr 1.58 will try to add low dose entresto and see how her renal function fairs.   PAF:  change to eliquis  d/c heparin  Had [facility] on 05/09/24 amiodarone 200 mg bid for 14 days then 200 mg daily CAD: CAth 05/11/24 no obstrucitve dx RPL 60% tightest lesion. At that time LVEDP was 20 mmHg    Destiny Brown 06/04/2024, 7:53 AM

## 2024-06-04 NOTE — Progress Notes (Signed)
 Patient currently resting with eyes closed, remains in afib with rates low 100s-120.  BP 103/53.  Dr. Sanjuana Crutch paged with update per request.

## 2024-06-05 ENCOUNTER — Telehealth (HOSPITAL_COMMUNITY): Payer: Self-pay | Admitting: Pharmacy Technician

## 2024-06-05 ENCOUNTER — Encounter (HOSPITAL_COMMUNITY): Payer: Self-pay | Admitting: Internal Medicine

## 2024-06-05 ENCOUNTER — Other Ambulatory Visit (HOSPITAL_COMMUNITY): Payer: Self-pay

## 2024-06-05 DIAGNOSIS — I5021 Acute systolic (congestive) heart failure: Secondary | ICD-10-CM

## 2024-06-05 DIAGNOSIS — I5023 Acute on chronic systolic (congestive) heart failure: Secondary | ICD-10-CM | POA: Insufficient documentation

## 2024-06-05 DIAGNOSIS — I509 Heart failure, unspecified: Secondary | ICD-10-CM | POA: Diagnosis not present

## 2024-06-05 LAB — BASIC METABOLIC PANEL WITH GFR
Anion gap: 12 (ref 5–15)
BUN: 36 mg/dL — ABNORMAL HIGH (ref 8–23)
CO2: 25 mmol/L (ref 22–32)
Calcium: 9.3 mg/dL (ref 8.9–10.3)
Chloride: 99 mmol/L (ref 98–111)
Creatinine, Ser: 2 mg/dL — ABNORMAL HIGH (ref 0.44–1.00)
GFR, Estimated: 25 mL/min — ABNORMAL LOW (ref >60)
Glucose, Bld: 184 mg/dL — ABNORMAL HIGH (ref 70–99)
Potassium: 4 mmol/L (ref 3.5–5.1)
Sodium: 136 mmol/L (ref 135–145)

## 2024-06-05 LAB — GLUCOSE, CAPILLARY
Glucose-Capillary: 176 mg/dL — ABNORMAL HIGH (ref 70–99)
Glucose-Capillary: 188 mg/dL — ABNORMAL HIGH (ref 70–99)
Glucose-Capillary: 216 mg/dL — ABNORMAL HIGH (ref 70–99)
Glucose-Capillary: 260 mg/dL — ABNORMAL HIGH (ref 70–99)
Glucose-Capillary: 278 mg/dL — ABNORMAL HIGH (ref 70–99)
Glucose-Capillary: 306 mg/dL — ABNORMAL HIGH (ref 70–99)
Glucose-Capillary: 180 mg/dL — ABNORMAL HIGH (ref 70–99)
Glucose-Capillary: 148 mg/dL — ABNORMAL HIGH (ref 70–99)
Glucose-Capillary: 168 mg/dL — ABNORMAL HIGH (ref 70–99)

## 2024-06-05 MED ORDER — OFF THE BEAT BOOK
Freq: Once | Status: AC
  Filled 2024-06-05: qty 1

## 2024-06-05 MED ORDER — AMIODARONE HCL IN DEXTROSE 360-4.14 MG/200ML-% IV SOLN
30.0000 mg/h | INTRAVENOUS | Status: DC
  Administered 2024-06-05: 30 mg/h via INTRAVENOUS
  Administered 2024-06-05: 60 mg/h via INTRAVENOUS
  Administered 2024-06-05 – 2024-06-07 (×4): 30 mg/h via INTRAVENOUS
  Administered 2024-06-07: 60 mg/h via INTRAVENOUS
  Administered 2024-06-08: 30 mg/h via INTRAVENOUS
  Administered 2024-06-08: 60 mg/h via INTRAVENOUS
  Administered 2024-06-09: 30 mg/h via INTRAVENOUS
  Filled 2024-06-05 (×9): qty 200

## 2024-06-05 MED ORDER — AMIODARONE HCL IN DEXTROSE 360-4.14 MG/200ML-% IV SOLN
60.0000 mg/h | INTRAVENOUS | Status: AC
  Administered 2024-06-05: 60 mg/h via INTRAVENOUS
  Filled 2024-06-05 (×2): qty 200

## 2024-06-05 NOTE — Evaluation (Signed)
 Occupational Therapy Evaluation Patient Details Name: Destiny Brown MRN: [id number] DOB: Nov 01, 1946 Today's Date: 06/05/2024   History of Present Illness   Pt is a 78 y.o female readmitted 6/6 with SOB after recent admission 5/13-16 for afib with RVR.  PMHx:   paroxysmal A-fib on Eliquis , CAD status post PCI to LCx 03/2023, LBBB, type 2 diabetes, hypertension, obesity, RA not on DMARD, CKD stage IIIa, hypothyroidism, OSA not on CPAP     Clinical Impressions Pt presents with decline in function and safety with impaired strength and activity tolerance; pt limited by RVR with HR increasing to 128-130 during sit - stand at EOB. PTA pt lives at home with her husband and was Ind with ADLs, IADLs, home mgt, driving, cooking and used cane recently for mobility. Pt currently requires assist with ADLs and selfcare tasks; unable to progress activity from EOB sitting to beyond standing at EOB due to RVR this session, RN notified. OT will follow acutely to maximize level of function and safety     If plan is discharge home, recommend the following:   A lot of help with bathing/dressing/bathroom;A little help with walking and/or transfers;Assistance with cooking/housework;Assist for transportation;Help with stairs or ramp for entrance     Functional Status Assessment   Patient has had a recent decline in their functional status and demonstrates the ability to make significant improvements in function in a reasonable and predictable amount of time.     Equipment Recommendations   Toilet riser;Toilet rise with handles     Recommendations for Other Services         Precautions/Restrictions   Precautions Precautions: Other (comment) (RVR) Recall of Precautions/Restrictions: Intact Restrictions Weight Bearing Restrictions Per Provider Order: No     Mobility Bed Mobility Overal bed mobility: Needs Assistance Bed Mobility: Supine to Sit, Sit to Supine     Supine to sit: Min  assist Sit to supine: Min assist   General bed mobility comments: increased time and effort    Transfers Overall transfer level: Needs assistance Equipment used: 1 person hand held assist Transfers: Sit to/from Stand Sit to Stand: Min assist           General transfer comment: HR increasing to 128-130 standing, pt returned to supine      Balance                                           ADL either performed or assessed with clinical judgement   ADL Overall ADL's : Needs assistance/impaired Eating/Feeding: Independent   Grooming: Wash/dry hands;Wash/dry face;Set up;Sitting   Upper Body Bathing: Contact guard assist   Lower Body Bathing: Moderate assistance   Upper Body Dressing : Contact guard assist                     General ADL Comments: RVRm HR increasing to upper 120s seated     Vision Baseline Vision/History: 0 No visual deficits Ability to See in Adequate Light: 0 Adequate Patient Visual Report: No change from baseline       Perception         Praxis         Pertinent Vitals/Pain Pain Assessment Pain Assessment: No/denies pain Faces Pain Scale: No hurt     Extremity/Trunk Assessment Upper Extremity Assessment Upper Extremity Assessment: Overall WFL for tasks assessed   Lower Extremity Assessment Lower  Extremity Assessment: Defer to PT evaluation       Communication Communication Communication: No apparent difficulties   Cognition Arousal: Alert Behavior During Therapy: WFL for tasks assessed/performed                                 Following commands: Intact       Cueing  General Comments   Cueing Techniques: Verbal cues  RVR 128-130 sit - stand   Exercises     Shoulder Instructions      Home Living Family/patient expects to be discharged to:: Private residence Living Arrangements: Spouse/significant other Available Help at Discharge: Family Type of Home: House Home Access:  Stairs to enter Secretary/administrator of Steps: 3 Entrance Stairs-Rails: Right Home Layout: One level     Bathroom Shower/Tub: Producer, television/film/video: Standard     Home Equipment: Shower seat;Grab bars - toilet;Grab bars - tub/shower          Prior Functioning/Environment Prior Level of Function : Independent/Modified Independent;Driving             Mobility Comments: using cane recently ADLs Comments: Ind with ADLs. IADLs, shopping, cooking, home mgt    OT Problem List: Decreased strength;Decreased activity tolerance;Cardiopulmonary status limiting activity;Decreased knowledge of use of DME or AE   OT Treatment/Interventions: Self-care/ADL training;Patient/family education;DME and/or AE instruction;Therapeutic activities;Energy conservation      OT Goals(Current goals can be found in the care plan section)   Acute Rehab OT Goals Patient Stated Goal: go home OT Goal Formulation: With patient Time For Goal Achievement: 06/19/24 Potential to Achieve Goals: Good ADL Goals Pt Will Perform Grooming: with contact guard assist;standing Pt Will Perform Upper Body Bathing: with set-up Pt Will Perform Lower Body Bathing: with min assist;with contact guard assist Pt Will Perform Upper Body Dressing: with set-up Pt Will Perform Lower Body Dressing: with min assist;with contact guard assist Pt Will Transfer to Toilet: with min assist;with contact guard assist;ambulating;stand pivot transfer;bedside commode Pt Will Perform Toileting - Clothing Manipulation and hygiene: with min assist;with contact guard assist;sit to/from stand   OT Frequency:  Min 2X/week    Co-evaluation              AM-PAC OT "6 Clicks" Daily Activity     Outcome Measure Help from another person eating meals?: None Help from another person taking care of personal grooming?: A Little Help from another person toileting, which includes using toliet, bedpan, or urinal?: A Lot Help from  another person bathing (including washing, rinsing, drying)?: A Little Help from another person to put on and taking off regular upper body clothing?: A Lot Help from another person to put on and taking off regular lower body clothing?: A Lot 6 Click Score: 16   End of Session Equipment Utilized During Treatment: Gait belt  Activity Tolerance: Other (comment) (limited by RVR) Patient left:    OT Visit Diagnosis: Unsteadiness on feet (R26.81);Other abnormalities of gait and mobility (R26.89)                Time: 1027-2536 OT Time Calculation (min): 25 min Charges:  OT General Charges $OT Visit: 1 Visit OT Evaluation $OT Eval Moderate Complexity: 1 Mod OT Treatments $Therapeutic Activity: 8-22 mins    Alfred Ann 06/05/2024, 3:49 PM

## 2024-06-05 NOTE — Progress Notes (Addendum)
 Progress Note  Patient Name: Destiny Brown Date of Encounter: 06/05/2024 [facility] Cardiologist: Richardo Chandler, MD   Interval Summary    Patient reports dry cough this morning. Is otherwise feeling well. Despite recurrent afib with RVR last night, denies symptoms of dyspnea, palpitations, chest discomfort.  Vital Signs Vitals:   06/04/24 2344 06/05/24 0244 06/05/24 0344 06/05/24 0456  BP: (!) 103/53 103/65 107/74   Pulse: (!) 59  71   Resp:      Temp:    97.8 F (36.6 C)  TempSrc:    Axillary  SpO2: 98%  95%   Weight:    84.3 kg  Height:        Intake/Output Summary (Last 24 hours) at 06/05/2024 0805 Last data filed at 06/04/2024 1300 Gross per 24 hour  Intake --  Output 1900 ml  Net -1900 ml      06/05/2024    4:56 AM 06/04/2024    6:06 AM 06/03/2024    3:53 PM  Last 3 Weights  Weight (lbs) 185 lb 14.4 oz 192 lb 3.2 oz 193 lb 6.4 oz  Weight (kg) 84.324 kg 87.181 kg 87.726 kg      Telemetry/ECG  Persistent afib since around 10pm on 6/8, ventricular rates 110-130bpm - Personally Reviewed  Physical Exam  GEN: No acute distress.   Neck: No JVD Cardiac: irregularly irregular, no murmurs, rubs, or gallops.  Respiratory: Faint bibasilar crackles GI: Soft, nontender, non-distended  MS: No edema  Assessment & Plan  78 year old female with history of atrial fibrillation, CAD, hypertension, DM, CKD.   Recurrent paroxysmal atrial fibrillation Patient with recent admission for afib requiring DCCV now re-admitted with worsening dyspnea and recurrent afib. Initially patient converted back to NSR following diuresis, then had recurrent afib with aberrant ventricular conduction/rapid ventricular rates. With recurrence, was started on IV Amiodarone and again converted to NSR. EP team consulted on patient 6/7 and recommended additional 24-48 hours of IV Amiodarone. On the morning of 6/8, patient converted to PO Amiodarone. Lopressor  also continued. At ~10pm on 6/8,  cardiology moonlighter notified of HR in the 40s. Patient then quickly went back into symptomatic afib with RVR. Amiodarone re-bolused and infusion restarted.  Patient has received approximately 1.6g of Amiodarone this admission. Given recurrent RVR and hypotension, would continue IV infusion at least another 24 hours. With poorly controlled rates this morning, increase infusion rate to 60mg /hr. Continue Eliquis  5mg  BID Although patient did have HR into the upper 40s last night when in sinus rhythm, would not stop low dose Lopressor  12.5mg  BID. Consider consolidating to Toprol  XL at d/c.   Acute systolic CHF Hypoxic respiratory failure Patient with dyspnea and hypoxia on admission. Recent echo from 6/6 found LVEF 25-30%, global hypokinesis. Notably also had LHC on 5/15 with no evidence of obstructive disease. BNP this admission elevated to 1135.6. Patient treated with IV lasis. On 6/8, was also started on low dose Entresto and Aldactone. Patient net negative 2.4L this admission. Suspect her acute dyspnea/CHF driven by afib with RVR.  Soft BP continues this morning. Will need to hold Entresto and Spironolactone. As above, consider consolidation of Lopressor  to Toprol  XL at d/c. Notable creatinine bump this morning 1.58->2.0, will hold Lasix . Suspect that if we can control HR, volume will also stabilize. Consider SGLT2  Non-obstructive CAD Elevated troponin Patient s/p LHC on 5/15 with no evidence of obstructive CAD. 1st RPL lesion is 60% stenosed. Ost LAD to Prox LAD lesion is 40% stenosed. Estella Helling  LM lesion is 20% stenosed. Troponin of 211->272 consistent with demand ischemia in setting of afib with RVR and CHF exacerbation. LDL reasonably well controlled, continue Crestor  5mg  though would increase frequency to daily. No ASA with Eliquis   Lab Results  Component Value Date   CHOL 157 05/03/2024   HDL 66 05/03/2024   LDLCALC 62 05/03/2024   TRIG 177 (H) 05/03/2024   CHOLHDL 2.4 05/03/2024        For questions or updates, please contact [facility] Please consult [email or web address] for contact info under       Signed, Leala Prince, PA-C   Patient seen and examined with EW PA-C.  Agree as above, with the following exceptions and changes as noted below. Feels well with no complaints, no sx. Gen: NAD, CV: irregular and fast, no murmurs, Lungs: clear, Abd: soft, Extrem: Warm, well perfused, no edema, Neuro/Psych: alert and oriented x 3, normal mood and affect. All available labs, radiology testing, previous records reviewed. Agree as above with increasing amiodarone to load more expeditiously. Hold lasix . She feels well.   Hughie Melroy A Clearance Chenault, MD 06/05/24 10:16 AM

## 2024-06-05 NOTE — Telephone Encounter (Signed)
 Patient Product/process development scientist completed.    The patient is insured through [facility]. Patient has Medicare and is not eligible for a copay card, but may be able to apply for patient assistance or Medicare RX Payment Plan (Patient Must reach out to their plan, if eligible for payment plan), if available.    Ran test claim for Entresto 24-26 mg and the current 30 day co-pay is $47.00.   This test claim was processed through [facility]- copay amounts may vary at other pharmacies due to pharmacy/plan contracts, or as the patient moves through the different stages of their insurance plan.     Roland Earl, CPHT Pharmacy Technician III Certified Patient Advocate [facility] Patient Advocate Team Direct Number: [phone]  Fax: [phone]

## 2024-06-05 NOTE — Progress Notes (Signed)
   06/04/24 2146  Assess: MEWS Score  BP 120/89  Pulse Rate (!) 140  Level of Consciousness Alert  SpO2 97 %  O2 Device Nasal Cannula  Patient Activity (if Appropriate) In bed  O2 Flow Rate (L/min) 3 L/min  Assess: MEWS Score  MEWS Temp 0  MEWS Systolic 0  MEWS Pulse 3  MEWS RR 0  MEWS LOC 0  MEWS Score 3  MEWS Score Color Yellow  Assess: if the MEWS score is Yellow or Red  Were vital signs accurate and taken at a resting state? Yes  Does the patient meet 2 or more of the SIRS criteria? No  MEWS guidelines implemented  Yes, yellow  Treat  MEWS Interventions Considered administering scheduled or prn medications/treatments as ordered  Take Vital Signs  Increase Vital Sign Frequency  Yellow: Q2hr x1, continue Q4hrs until patient remains green for 12hrs  Escalate  MEWS: Escalate Yellow: Discuss with charge nurse and consider notifying provider and/or RRT  Notify: Charge Nurse/RN  Name of Charge Nurse/RN Notified Barkley Li, RN  Provider Notification  Provider Name/Title Osude  Date Provider Notified 06/04/24  Time Provider Notified 2144  Method of Notification Page  Notification Reason Other (Comment)  Provider response See new orders  Date of Provider Response 06/04/24  Time of Provider Response 2146  Notify: Rapid Response  Name of Rapid Response RN Notified Dave, RN  Date Rapid Response Notified 06/04/24  Time Rapid Response Notified 2245  Assess: SIRS CRITERIA  SIRS Temperature  0  SIRS Respirations  0  SIRS Pulse 1  SIRS WBC 0  SIRS Score Sum  1

## 2024-06-05 NOTE — Evaluation (Signed)
 Physical Therapy Evaluation Patient Details Name: Destiny Brown MRN: [id number] DOB: 1946/01/27 Today's Date: 06/05/2024  History of Present Illness  Pt is a 78 y.o female readmitted 6/6 with SOB after recent admission 5/13-16 for afib with RVR.  PMHx:   paroxysmal A-fib on Eliquis , CAD status post PCI to LCx 03/2023, LBBB, type 2 diabetes, hypertension, obesity, RA not on DMARD, CKD stage IIIa, hypothyroidism, OSA not on CPAP  Clinical Impression  Pt admitted with/for SOB in part due to afib with RVR.  Pt currently limited functionally due to the problems listed below.  (see problems list.)  Pt will benefit from PT to maximize function and safety to be able to get home safely with available assist.         If plan is discharge home, recommend the following: A little help with walking and/or transfers;A little help with bathing/dressing/bathroom;Assistance with cooking/housework;Assist for transportation   Can travel by private vehicle        Equipment Recommendations None recommended by PT  Recommendations for Other Services       Functional Status Assessment Patient has had a recent decline in their functional status and demonstrates the ability to make significant improvements in function in a reasonable and predictable amount of time.     Precautions / Restrictions Precautions Precautions:  (RVR) Recall of Precautions/Restrictions: Intact      Mobility  Bed Mobility Overal bed mobility: Needs Assistance Bed Mobility: Sidelying to Sit, Sit to Supine   Sidelying to sit: Min assist   Sit to supine: Min assist   General bed mobility comments: extra time, minimal assist from relatively flat mattress.    Transfers Overall transfer level: Needs assistance Equipment used: 1 person hand held assist Transfers: Sit to/from Stand, Bed to chair/wheelchair/BSC Sit to Stand: Min assist           General transfer comment: minimal step to steps.    Ambulation/Gait                General Gait Details: side stepping min HHA  Stairs            Wheelchair Mobility     Tilt Bed    Modified Rankin (Stroke Patients Only)       Balance                                             Pertinent Vitals/Pain Pain Assessment Pain Assessment: Faces Faces Pain Scale: No hurt Pain Intervention(s): Monitored during session    Home Living Family/patient expects to be discharged to:: Private residence Living Arrangements: Spouse/significant other Available Help at Discharge: Family (including grandson) Type of Home: House Home Access: Stairs to enter Entrance Stairs-Rails: Right Entrance Stairs-Number of Steps: 3   Home Layout: One level Home Equipment: Shower seat;Grab bars - toilet;Grab bars - tub/shower      Prior Function Prior Level of Function : Independent/Modified Independent;Other (comment) (active)                     Extremity/Trunk Assessment   Upper Extremity Assessment Upper Extremity Assessment: Defer to OT evaluation    Lower Extremity Assessment Lower Extremity Assessment: Overall WFL for tasks assessed (mild proximal weakness, but functional)       Communication   Communication Communication: No apparent difficulties    Cognition Arousal: Alert Behavior During Therapy: [facility]  for tasks assessed/performed   PT - Cognitive impairments: No apparent impairments                         Following commands: Intact       Cueing Cueing Techniques: Verbal cues     General Comments General comments (skin integrity, edema, etc.): Still with RVR 100 to 120's with spike to 137 max.    Exercises     Assessment/Plan    PT Assessment Patient needs continued PT services  PT Problem List Decreased strength;Decreased activity tolerance;Decreased balance;Decreased mobility;Cardiopulmonary status limiting activity       PT Treatment Interventions DME instruction;Gait  training;Functional mobility training;Stair training;Therapeutic activities;Balance training;Patient/family education    PT Goals (Current goals can be found in the Care Plan section)  Acute Rehab PT Goals Patient Stated Goal: able to get back to volunteering,  Independent PT Goal Formulation: With patient Time For Goal Achievement: 06/19/24 Potential to Achieve Goals: Good    Frequency Min 3X/week     Co-evaluation               AM-PAC PT "6 Clicks" Mobility  Outcome Measure Help needed turning from your back to your side while in a flat bed without using bedrails?: A Little Help needed moving from lying on your back to sitting on the side of a flat bed without using bedrails?: A Little Help needed moving to and from a bed to a chair (including a wheelchair)?: A Little Help needed standing up from a chair using your arms (e.g., wheelchair or bedside chair)?: A Little Help needed to walk in hospital room?: A Little Help needed climbing 3-5 steps with a railing? : A Little 6 Click Score: 18    End of Session   Activity Tolerance: Patient tolerated treatment well Patient left: in bed;with call bell/phone within reach Nurse Communication: Mobility status PT Visit Diagnosis: Other abnormalities of gait and mobility (R26.89)    Time: 0932-6712 PT Time Calculation (min) (ACUTE ONLY): 27 min   Charges:   PT Evaluation $PT Eval Moderate Complexity: 1 Mod PT Treatments $Therapeutic Activity: 8-22 mins PT General Charges $$ ACUTE PT VISIT: 1 Visit         06/05/2024  Nohemi Batters., PT Acute Rehabilitation Services [phone]  (office)  Durell Gilding Lorely Bubb 06/05/2024, 3:05 PM

## 2024-06-05 NOTE — Progress Notes (Signed)
 Heart Failure Nurse Navigator Progress Note  PCP: Rosslyn Coons, MD PCP-Cardiologist: Rodolfo Clan Admission Diagnosis: Acute hypoxic respiratory failure, Acute systolic CHF.  Admitted from: Home   Presentation:   Destiny Brown presented with shortness of breath, seen at her PCP earlier that day, they wanted her to go to the ED, she wanted to go home. She went home became more short of breath and came into the ED. Recently in hospital for a-fib with RVR. BP 168/85, HR 102, BNP 1,135, Troponin 79, CXR with pulmonary edema, ECHO with LVEF 25-30%, New CHF. Patient has converted to NSR and then flipped back into A-fib multiple times. Was started on IV amiodarone on 06/05/2024. Patient had a heart cath on previous admission on 05/11/2024: with no evidence of obstructive CAD. 1st RPL lesion is 60% stenosed. Ost LAD to Prox LAD lesion is 40% stenosed. Ost LM lesion is 20% stenosed.   Patient was educated on the sign and symptoms of heart failure, daily weights, when to call her doctor or go to the ED, Diet/ fluid restrictions, taking all medications as prescribed and attending all medical appointments. Patient asked numerous question, and verbalized her further understanding of the HF education. A HF TOC appointment was scheduled for 06/26/2024 @ 10:15 am per Dr. Drexel Gentles request after [facility] appointment on 06/13/2024.   ECHO/ LVEF: 25-30%  Clinical Course:  Past Medical History:  Diagnosis Date   Arthritis    Atrial fibrillation with RVR (HCC) 09/21/2014   Bronchitis    h/o   Diabetes mellitus    NO MEDS,  DIET CONTROLLED   History of migraines    "when working under alot of stress"   Hypertension    Hypothyroid    LBBB (left bundle branch block) 09/22/2014   OSA (obstructive sleep apnea) 04/09/2015   does not use cpap   PONV (postoperative nausea and vomiting)    PT DOESNOT WANT TO TAKE HER ATENOLOL      DOS.   " IT DROPS MY BP TO LOW"     Social History   Socioeconomic History   Marital status:  Married    Spouse name: Not on file   Number of children: 2   Years of education: 13   Highest education level: Not on file  Occupational History   Occupation: part Chiropractor   Occupation: retired  Tobacco Use   Smoking status: Never   Smokeless tobacco: Never  Vaping Use   Vaping status: Never Used  Substance and Sexual Activity   Alcohol use: No   Drug use: No   Sexual activity: Never  Other Topics Concern   Not on file  Social History Narrative   Patient drinks about 2-3 cups of caffeine daily.   Patient is right handed.    Social Drivers of Corporate investment banker Strain: Not on file  Food Insecurity: No Food Insecurity (06/03/2024)   Hunger Vital Sign    Worried About Running Out of Food in the Last Year: Never true    Ran Out of Food in the Last Year: Never true  Transportation Needs: No Transportation Needs (06/03/2024)   PRAPARE - Administrator, Civil Service (Medical): No    Lack of Transportation (Non-Medical): No  Physical Activity: Not on file  Stress: Not on file  Social Connections: Socially Integrated (06/03/2024)   Social Connection and Isolation Panel [NHANES]    Frequency of Communication with Friends and Family: More than three times a week  Frequency of Social Gatherings with Friends and Family: More than three times a week    Attends Religious Services: More than 4 times per year    Active Member of Clubs or Organizations: Yes    Attends Engineer, structural: More than 4 times per year    Marital Status: Married   Water engineer and Provision:  Detailed education and instructions provided on heart failure disease management including the following:  Signs and symptoms of Heart Failure When to call the physician Importance of daily weights Low sodium diet Fluid restriction Medication management Anticipated future follow-up appointments  Patient education given on each of the above topics.  Patient  acknowledges understanding via teach back method and acceptance of all instructions.  Education Materials:  "Living Better With Heart Failure" Booklet, HF zone tool, & Daily Weight Tracker Tool.  Patient has scale at home: Yes Patient has pill box at home: Yes    High Risk Criteria for Readmission and/or Poor Patient Outcomes: Heart failure hospital admissions (last 6 months): 1  No Show rate: 1 % Difficult social situation: No, lives with her husband and 2 grandsons.  Demonstrates medication adherence: yes Primary Language: English Literacy level: Reading, writing, and comprehension  Barriers of Care:   Diet/ fluids restrictions Daily weights  Considerations/Referrals:   Referral made to Heart Failure Pharmacist Stewardship: yes Referral made to Heart Failure CSW/NCM TOC: No Referral made to Heart & Vascular TOC clinic: Yes, 06/26/2024 @ 10:15 per Dr. Drexel Gentles after [facility] appt on 06/13/2024.   Items for Follow-up on DC/TOC: Continued HF education Diet/ fluid restrictions/ daily weights    Randie Bustle, BSN, RN Heart Failure Print production planner Chat Only

## 2024-06-05 NOTE — Progress Notes (Addendum)
 PROGRESS NOTE    Destiny Brown  ZOX:[id number] DOB: May 12, 1946 DOA: 06/02/2024 PCP: Rosslyn Coons, MD    78/F w paroxysmal A-fib on Eliquis , CAD status  PCI to LCx 03/2023, LBBB, type 2 diabetes, hypertension, obesity, RA not on DMARD, CKD stage IIIa, hypothyroidism, OSA not on CPAP.  Recently admitted 5/13-5/16 for A-fib with RVR and wide-complex tachycardia status post DCCV x 1 with return of NSR.  LHC >showed widely patent left circumflex stent with stable moderate proximal LAD stenosis and no evidence of obstructive disease.  Patient was discharged on metoprolol  25 mg twice daily.  patient's other antihypertensive medications including amlodipine , Lasix , hydrochlorothiazide , and losartan  were discontinued. - Had an outpatient echo which noted EF of 25-30%, grade 2 DD, mild MR -Presented to the ED 6/6 with worsening dyspnea, some edema -In the ED tachypneic tachycardic, hypoxic, creatinine 1.2, troponin 79, 221, BNP 1135, chest x-ray with cardiomegaly pulmonary vascular congestion edema and effusions  Subjective: Still, with some dyspnea, appears to be back in Afib  Assessment and Plan:  Acute systolic CHF, new diagnosis Acute hypoxemic respiratory failure -Recent 2D echo PTA noted EF 25-30%, grade 2 DD, mild MR - Continue IV Lasix  today, 2.4L negative, cards following - Creat trending up after starting entresto yesterday, will hold todays dose -May not be a good candidate for SGLT2i - Suspect cardiomyopathy is secondary to recent A-fib RVR, LHC in May noted stable CAD -PT OT, patient indicates desire for short-term rehab  Atrial fibrillation with RVR - Converted to sinus rhythm 6/7, remains on IV Amio, plan to transition to p.o.  - continue eliquis  -now back in Afib while on amio, continue metoprolol  -await Cards input  Aki on CKD3a -creat trending up, see discussion above, hold entresto today   Elevated troponin CAD status post PCI to LCx 03/2023 Elevated troponin likely due  to demand ischemia rather than ACS.  Troponin 211, 272 - Recent LHC showed widely patent left circumflex stent with stable moderate proximal LAD stenosis and no evidence of obstructive disease.   QT prolongation Monitor electrolytes  OSA/OHS -Unfortunately unable to tolerate CPAP   Type 2 diabetes A1c 6.6 on 05/09/2024.  Placed on sensitive sliding scale insulin  ACHS.   Hypertension IV Lasix  given and continue metoprolol .   Hyperlipidemia Continue Crestor .  Hypothyroidism Continue Synthroid .   Chronic pain/neuropathy Continue Lyrica .   DVT prophylaxis: Eliquis  Code Status: Full Code (discussed with the patient) Disposition Plan: PT OT, TBD    Objective: Vitals:   06/04/24 2344 06/05/24 0244 06/05/24 0344 06/05/24 0456  BP: (!) 103/53 103/65 107/74   Pulse: (!) 59  71   Resp:      Temp:    97.8 F (36.6 C)  TempSrc:    Axillary  SpO2: 98%  95%   Weight:    84.3 kg  Height:        Intake/Output Summary (Last 24 hours) at 06/05/2024 0754 Last data filed at 06/04/2024 1300 Gross per 24 hour  Intake --  Output 1900 ml  Net -1900 ml   Filed Weights   06/03/24 1553 06/04/24 0606 06/05/24 0456  Weight: 87.7 kg 87.2 kg 84.3 kg    Examination:  General exam: Appears calm and comfortable obese, AAO x 3 mild distress HEENT: Positive JVD Respiratory system: Few basilar rales otherwise clear CVS: S1-S2, regular rhythm now Abdomen: Soft, nontender, bowel sounds present  EXTR: trace  edema Skin: No rashes Psychiatry:  Mood & affect appropriate.     Data Reviewed:  CBC: Recent Labs  Lab 06/02/24 1936 06/02/24 2006 06/04/24 0353  WBC 9.9  --  13.3*  NEUTROABS 7.7  --   --   HGB 12.1 12.2 11.5*  HCT 38.4 36.0 36.5  MCV 87.1  --  84.9  PLT 202  --  239   Basic Metabolic Panel: Recent Labs  Lab 06/02/24 1936 06/02/24 2006 06/03/24 0204 06/04/24 0353 06/05/24 0447  NA 138 140 138 135 136  K 3.6 3.4* 3.4* 4.6 4.0  CL 106  --  105 103 99  CO2 19*  --   17* 21* 25  GLUCOSE 271*  --  298* 170* 184*  BUN 19  --  19 30* 36*  CREATININE 1.29*  --  1.44* 1.58* 2.00*  CALCIUM  9.1  --  9.3 9.7 9.3  MG 2.0  --  1.6*  --   --    GFR: Estimated Creatinine Clearance: 24.4 mL/min (A) (by C-G formula based on SCr of 2 mg/dL (H)). Liver Function Tests: Recent Labs  Lab 06/02/24 1936  AST 23  ALT 16  ALKPHOS 73  BILITOT 1.0  PROT 6.5  ALBUMIN  3.7   No results for input(s): "LIPASE", "AMYLASE" in the last 168 hours. No results for input(s): "AMMONIA" in the last 168 hours. Coagulation Profile: No results for input(s): "INR", "PROTIME" in the last 168 hours. Cardiac Enzymes: No results for input(s): "CKTOTAL", "CKMB", "CKMBINDEX", "TROPONINI" in the last 168 hours. BNP (last 3 results) No results for input(s): "PROBNP" in the last 8760 hours. HbA1C: No results for input(s): "HGBA1C" in the last 72 hours. CBG: Recent Labs  Lab 06/03/24 2139 06/04/24 0839 06/04/24 1229 06/04/24 1623 06/04/24 2137  GLUCAP 173* 169* 178* 121* 148*   Lipid Profile: No results for input(s): "CHOL", "HDL", "LDLCALC", "TRIG", "CHOLHDL", "LDLDIRECT" in the last 72 hours. Thyroid  Function Tests: No results for input(s): "TSH", "T4TOTAL", "FREET4", "T3FREE", "THYROIDAB" in the last 72 hours. Anemia Panel: No results for input(s): "VITAMINB12", "FOLATE", "FERRITIN", "TIBC", "IRON", "RETICCTPCT" in the last 72 hours. Urine analysis:    Component Value Date/Time   COLORURINE YELLOW 03/15/2017 1140   APPEARANCEUR CLEAR 03/15/2017 1140   LABSPEC 1.010 03/15/2017 1140   PHURINE 7.0 03/15/2017 1140   GLUCOSEU 50 (A) 03/15/2017 1140   HGBUR NEGATIVE 03/15/2017 1140   BILIRUBINUR NEGATIVE 03/15/2017 1140   KETONESUR NEGATIVE 03/15/2017 1140   PROTEINUR NEGATIVE 03/15/2017 1140   UROBILINOGEN 0.2 09/21/2014 1836   NITRITE NEGATIVE 03/15/2017 1140   LEUKOCYTESUR NEGATIVE 03/15/2017 1140   Sepsis Labs: @LABRCNTIP (procalcitonin:4,lacticidven:4)  )No results  found for this or any previous visit (from the past 240 hours).   Radiology Studies: DG Chest Port 1 View Result Date: 06/04/2024 CLINICAL DATA:  Shortness of breath. EXAM: PORTABLE CHEST 1 VIEW COMPARISON:  06/02/2024 FINDINGS: Stable cardiac enlargement. Aortic atherosclerosis. Interval improvement in bilateral pleural effusions and interstitial edema. Decreased atelectasis noted in the left base. No new findings. Dorsal column stimulator within the thoracic canal. Signs of previous ACDF within the cervical spine. Right shoulder arthroplasty. IMPRESSION: 1. Interval improvement in bilateral pleural effusions and interstitial edema. 2. Decreased left base atelectasis. Electronically Signed   By: Kimberley Penman M.D.   On: 06/04/2024 08:17     Scheduled Meds:  apixaban   5 mg Oral BID   furosemide   40 mg Intravenous BID   insulin  aspart  0-5 Units Subcutaneous QHS   insulin  aspart  0-9 Units Subcutaneous TID WC   latanoprost   1 drop Both Eyes QHS  levothyroxine   100 mcg Oral Q0600   metoprolol  tartrate  12.5 mg Oral BID   pneumococcal 20-valent conjugate vaccine  0.5 mL Intramuscular Tomorrow-1000   pregabalin   75 mg Oral BID   rosuvastatin   5 mg Oral Once per day on Monday Thursday   spironolactone  12.5 mg Oral Daily   Continuous Infusions:  amiodarone 60 mg/hr (06/05/24 0138)   amiodarone 30 mg/hr (06/05/24 0715)      LOS: 3 days    Time spent:    Deforest Fast, MD [facility]   06/05/2024, 7:54 AM

## 2024-06-05 NOTE — Progress Notes (Signed)
  Patient converted to sinus rhythm this evening. Now in sinus bradycardia with HR 40s-50s. Will decrease Amiodarone to 30mg /hr. Given that she had recurrent afib with previous discontinuation of IV Amiodarone, would continue overnight at reduced rate unless she becomes symptomatic with bradycardia.   Leala Prince, PA-C

## 2024-06-05 NOTE — Progress Notes (Addendum)
 OT Cancellation Note  Patient Details Name: Destiny Brown MRN: [id number] DOB: 06/30/1946   Cancelled Treatment:    Reason Eval/Treat Not Completed: Medical issues which prohibited therapy. RN requests OT try later as pt tachy at rest and on amiodarone drip  Alfred Ann 06/05/2024, 11:37 AM

## 2024-06-06 ENCOUNTER — Inpatient Hospital Stay (HOSPITAL_COMMUNITY)

## 2024-06-06 ENCOUNTER — Telehealth (HOSPITAL_COMMUNITY): Payer: Self-pay | Admitting: Pharmacy Technician

## 2024-06-06 ENCOUNTER — Other Ambulatory Visit (HOSPITAL_COMMUNITY): Payer: Self-pay

## 2024-06-06 LAB — BASIC METABOLIC PANEL WITH GFR
Anion gap: 10 (ref 5–15)
BUN: 56 mg/dL — ABNORMAL HIGH (ref 8–23)
CO2: 30 mmol/L (ref 22–32)
Calcium: 9.1 mg/dL (ref 8.9–10.3)
Chloride: 94 mmol/L — ABNORMAL LOW (ref 98–111)
Creatinine, Ser: 2.89 mg/dL — ABNORMAL HIGH (ref 0.44–1.00)
GFR, Estimated: 16 mL/min — ABNORMAL LOW (ref >60)
Glucose, Bld: 185 mg/dL — ABNORMAL HIGH (ref 70–99)
Potassium: 4.2 mmol/L (ref 3.5–5.1)
Sodium: 134 mmol/L — ABNORMAL LOW (ref 135–145)

## 2024-06-06 LAB — GLUCOSE, CAPILLARY
Glucose-Capillary: 185 mg/dL — ABNORMAL HIGH (ref 70–99)
Glucose-Capillary: 189 mg/dL — ABNORMAL HIGH (ref 70–99)
Glucose-Capillary: 174 mg/dL — ABNORMAL HIGH (ref 70–99)
Glucose-Capillary: 164 mg/dL — ABNORMAL HIGH (ref 70–99)

## 2024-06-06 LAB — LACTIC ACID, PLASMA: Lactic Acid, Venous: 1.1 mmol/L (ref 0.5–1.9)

## 2024-06-06 MED ORDER — PREGABALIN 25 MG PO CAPS
50.0000 mg | ORAL_CAPSULE | Freq: Every day | ORAL | Status: DC
  Administered 2024-06-07 – 2024-06-10 (×4): 50 mg via ORAL
  Filled 2024-06-06 (×4): qty 2

## 2024-06-06 NOTE — Progress Notes (Addendum)
   Heart Failure Stewardship Pharmacist Progress Note   PCP: Rosslyn Coons, MD PCP-Cardiologist: Richardo Chandler, MD    HPI:  68 YOF with PMH of paroxysmal Afib on Eliquis , CAD s/p PCI to LCX 03/2023, LBBB, T2DM, HTN, obesity, RA, CKD, hypothyroidism, OSA.  Patient came to the ED following PCP appointment where she endorsed SOB, and underwent ECHO with new found LVEF 25-30%.  Outpatient ECHO showed LVEF 25-30%, LV severely decreased function, global hypokinesis, mild LV hypertrophy, grade II diastolic dysfunction, and L wave mitral inflow pattern. RV function mildly reduced, Mild calcification of aortic valve without evidence of aortic stenosis. L heart cath 05/11/2024 showed widely patent LCX with moderate proximal LAD stenosis without evidence of obstructive disease.  Previous ECHO 02/24/2023 showed LVEF 55-60% with normal LV function, mild hypertrophy, grade I diastolic dysfunction. Has been in and out of afib RVR this admission. Underwent chemical cardioversion with amiodarone last evening and continuing with IV amiodarone today Upon physical exam, no lower extremity swelling present. Pt denies having SOB, chest pain/tightness while at rest or while walking to bathroom. Some gait unsteadiness but able to move with walker as aid.   Current HF Medications: Beta Blocker: metoprolol  tartrate 12.5 mg BID ACE/ARB/ARNI: Entresto 24/26 mg MRA: spironolactone 12.5 mg     Prior to admission HF Medications: Diuretic: furosemide  20 mg PO daily Beta blocker: metoprolol  tartrate 25 mg BID ACE/ARB/ARNI: losartan  100 mg daily Other: amlodipine  5 mg daily Other: hydrochlorothiazide  25 mg daily   Pertinent Lab Values: Serum creatinine 2.89 , BUN 56, Potassium 4.2, Sodium 134, BNP 1,135.6, Magnesium  1.6 (6/7), A1c 6.6  Vital Signs: Weight: 180 lbs (admission weight: 193 lbs) Blood pressure: 146/57 (100-115/70's)  Heart rate: 49  I/O: net +0.7L yesterday; net -1.7L since admission -  incomplete  Medication Assistance / Insurance Benefits Check: Does the patient have prescription insurance?  Yes Type of insurance plan: [employer]  Does the patient qualify for medication assistance through manufacturers or grants?   Pending   Outpatient Pharmacy:  Prior to admission outpatient pharmacy: [facility] [address]  Is the patient willing to use [facility] TOC pharmacy at discharge? Pending Is the patient willing to transition their outpatient pharmacy to utilize a [facility] outpatient pharmacy?   Pending    Assessment: 1. Acute on chronic systolic CHF (LVEF 25-30%), presumed secondary to Afib RVR. NYHA class II symptoms. - Continue to hold Entresto in setting of inc SCr - Continue to hold spironolactone in setting of inc SCr - Continue to hold metoprolol , consider discontinuing - currently on IV amiodarone for Afib RVR, HR steady in 50's   Plan: 1) Medication changes recommended at this time: - Recommend discontinuing metoprolol  while HR remains low    2) Patient assistance: - Ineligible for co-pay cards Viola Greulich co-pay $47   3)  Education  - To be completed prior to discharge  [name], PharmD Candidate [employer] 06/06/2024 3:30 PM

## 2024-06-06 NOTE — Telephone Encounter (Signed)
 Patient Product/process development scientist completed.    The patient is insured through [facility]. Patient has Medicare and is not eligible for a copay card, but may be able to apply for patient assistance or Medicare RX Payment Plan (Patient Must reach out to their plan, if eligible for payment plan), if available.    Ran test claim for Farxiga 10 mg and the current 30 day co-pay is $47.00.  Ran test claim for Jardiance 10 mg and the current 30 day co-pay is $47.00.  This test claim was processed through [facility]- copay amounts may vary at other pharmacies due to pharmacy/plan contracts, or as the patient moves through the different stages of their insurance plan.     Roland Earl, CPHT Pharmacy Technician III Certified Patient Advocate [facility] Pharm[facility] Patient Advocate Team Direct Number: [phone]  Fax: [phone]

## 2024-06-06 NOTE — Progress Notes (Signed)
    Patient has repeatedly had Lopressor  held while on IV amiodarone due to HR 40-50s. Discontinued beta-blocker at this time. Will add back on when/if patient is able to tolerate with HR. Will continue to monitor.   Jiles Mote, PA-C 06/06/2024 12:40 PM

## 2024-06-06 NOTE — Progress Notes (Addendum)
 Progress Note  Patient Name: Destiny Brown Date of Encounter: 06/06/2024 [place] Brown Cardiologist: Destiny Chandler, MD   Interval Summary   Patient just returned from renal ultrasound Creatinine 2.89 today from 2.00  Reports feeling well, only complains of bilateral foot pain  Responding well to IV amiodarone, HR 50-60s  Waiting to ambulate with therapy today  Vital Signs Vitals:   06/06/24 0048 06/06/24 0539 06/06/24 0715 06/06/24 0744  BP: (!) 114/51 139/61  (!) 146/57  Pulse: (!) 47 (!) 47  (!) 50  Resp: 16 17  17   Temp: 97.7 F (36.5 C) 97.8 F (36.6 C)  97.6 F (36.4 C)  TempSrc: Oral Oral  Axillary  SpO2: 92% 100% 100% 100%  Weight:  81.7 kg    Height:        Intake/Output Summary (Last 24 hours) at 06/06/2024 0844 Last data filed at 06/06/2024 0745 Gross per 24 hour  Intake 1443.12 ml  Output 300 ml  Net 1143.12 ml      06/06/2024    5:39 AM 06/05/2024    4:56 AM 06/04/2024    6:06 AM  Last 3 Weights  Weight (lbs) 180 lb 1.9 oz 185 lb 14.4 oz 192 lb 3.2 oz  Weight (kg) 81.7 kg 84.324 kg 87.181 kg      Telemetry/ECG  Sinus bradycardia, HR 50s - Personally Reviewed  Physical Exam  GEN: No acute distress, on 3 L oxygen via [place].   Neck: No JVD Cardiac: bradycardic,no murmurs, rubs, or gallops.  Respiratory: decreased bilaterally. GI: Soft, nontender, non-distended  MS: No edema  Assessment & Plan   Recurrent paroxsymal atrial fibrillation Known history of A. Fib s/p DCCV  Presented with recurrent A. Fib and worsening dyspnea  Converted back to NSR with diuresis then had recurrence Started on IV amiodarone and converted back Transitioned to PO amiodarone, then had recurrence of A. Fib with RVR Re-bolused with IV amiodarone, converted back to NSR Currently in sinus bradycardia, HR 50s BP stable Due to recurrence of A. Fib RVR when stopped,  continue on IV amiodarone 30 mg/hr Continue Eliquis  5 mg BID  Has not received Lopressor  since 6/9 AM  with HR 40-50s, will discuss with MD in terms of continuing BB while on IV amio, beta-blocker will likely be beneficial when transitioning to PO amiodarone   Acute HFrEF Hypoxic respiratory failure Presented with hypoxia and dyspnea Echo 05/2024 showed: EF 25-30%, global hypokinesis LHC 04/2024: non-obstructive CAD BNP 1,135 Started on IV Lasix , Entresto, spironolactone  Net - 1.2 L this admission Renal function declined with above medications Creatinine 1.58 > 2.00 > 2.89 Hold Lasix , Entresto, spironolactone  Currently undergoing evaluation for AKI per primary team   Non-obstructive CAD  Elevated troponin levels  LHC 04/2024: non-obstructive CAD Troponin 211 > 272, likely demand ischemia with CHF exacerbation/A. Fib RVR  Not currently on ASA with Eliquis  use Continue Crestor     For questions or updates, please contact Destiny Brown Please consult [email or web address] for contact info under       Signed, Destiny Mote, PA-C   Patient seen and examined with TP PA-C.  Agree as above, with the following exceptions and changes as noted below. Foot pain on left otherwise well, back in SR. Gen: NAD, CV: RRR, no murmurs, Lungs: clear, Abd: soft, Extrem: Warm, well perfused, no edema, Neuro/Psych: alert and oriented x 3, normal mood and affect. All available labs, radiology testing, previous records reviewed. Continue IV amiodarone for now to complete  load, will consider tx to oral prior to discharge. Worsening renal function may be secondary to addition of GDMT 2 days ago, follow and hold those medications for now. Volume status has improved, hold lasix  with AKI.  Destiny Bello A Kysha Muralles, MD 06/06/24 10:49 AM

## 2024-06-06 NOTE — Progress Notes (Signed)
 Occupational Therapy Treatment Patient Details Name: Destiny Brown MRN: [id number] DOB: 1946-05-23 Today's Date: 06/06/2024   History of present illness Pt is a 78 y.o female readmitted 6/6 with SOB after recent admission 5/13-16 for afib with RVR.  PMHx:   paroxysmal A-fib on Eliquis , CAD status post PCI to LCx 03/2023, LBBB, type 2 diabetes, hypertension, obesity, RA not on DMARD, CKD stage IIIa, hypothyroidism, OSA not on CPAP   OT comments  Pt up in recliner upon therapy arrival and agreeable to participate in OT treatment session. Session focused on functional transfer training while pt transferred from recliner to bed. OT provided verbal instructions on sit to stand technique and body position in order to achieve an upright posture. Once pt demonstrated carry over of education, posterior bias as decreased. Pt continues to be appropriate for follow up home health OT services.       If plan is discharge home, recommend the following:  A little help with walking and/or transfers;A little help with bathing/dressing/bathroom;Help with stairs or ramp for entrance;Assist for transportation;Assistance with cooking/housework   Equipment Recommendations  Toilet riser;Toilet rise with handles       Precautions / Restrictions Precautions Precautions: Other (comment) (RVR) Recall of Precautions/Restrictions: Intact Restrictions Weight Bearing Restrictions Per Provider Order: No       Mobility Bed Mobility Overal bed mobility: Needs Assistance Bed Mobility: Sit to Supine       Sit to supine: Supervision        Transfers Overall transfer level: Needs assistance Equipment used: Rolling walker (2 wheels) Transfers: Sit to/from Stand, Bed to chair/wheelchair/BSC Sit to Stand: Min assist     Step pivot transfers: Contact guard assist     General transfer comment: VC for hand placement during RW management prior to sit to stand transition. Provded VC to lean trunk forward and  squeeze glutes to achieve an upright posture.     Balance Overall balance assessment: Needs assistance Sitting-balance support: No upper extremity supported, Feet supported Sitting balance-Leahy Scale: Fair Sitting balance - Comments: sitting unsupport on EOB   Standing balance support: Bilateral upper extremity supported, During functional activity, Reliant on assistive device for balance Standing balance-Leahy Scale: Poor Standing balance comment: posterior bias noted with physical assist needed to self correct            ADL either performed or assessed with clinical judgement              Communication Communication Communication: No apparent difficulties   Cognition Arousal: Alert Behavior During Therapy: WFL for tasks assessed/performed Cognition: No apparent impairments       Following commands: Intact                      Pertinent Vitals/ Pain       Pain Assessment Pain Assessment: Faces Faces Pain Scale: Hurts a little bit Pain Location: Right foot, left foot toe Pain Descriptors / Indicators: Aching, Discomfort, Grimacing Pain Intervention(s): Monitored during session         Frequency  Min 2X/week        Progress Toward Goals  OT Goals(current goals can now be found in the care plan section)  Progress towards OT goals: Progressing toward goals            AM-PAC OT "6 Clicks" Daily Activity     Outcome Measure   Help from another person eating meals?: None Help from another person taking care of personal grooming?: A Little  Help from another person toileting, which includes using toliet, bedpan, or urinal?: A Lot Help from another person bathing (including washing, rinsing, drying)?: A Little Help from another person to put on and taking off regular upper body clothing?: A Little Help from another person to put on and taking off regular lower body clothing?: A Lot 6 Click Score: 17    End of Session Equipment Utilized During  Treatment: Gait belt;Rolling walker (2 wheels);Oxygen  OT Visit Diagnosis: Unsteadiness on feet (R26.81);Other abnormalities of gait and mobility (R26.89)   Activity Tolerance Patient tolerated treatment well   Patient Left in bed;with call bell/phone within reach;with bed alarm set           Time: 1521-1536 OT Time Calculation (min): 15 min  Charges: OT General Charges $OT Visit: 1 Visit OT Treatments $Self Care/Home Management : 8-22 mins  Carollee Circle, OTR/L,CBIS  Supplemental OT - [facility] and [facility] Secure Chat Preferred    Fitzroy Mikami, Ocie Belt 06/06/2024, 3:53 PM

## 2024-06-06 NOTE — Progress Notes (Signed)
 PROGRESS NOTE    Destiny Brown  WUJ:[id number] DOB: 07-20-1946 DOA: 06/02/2024 PCP: Rosslyn Coons, MD    78/F w paroxysmal A-fib on Eliquis , CAD status  PCI to LCx 03/2023, LBBB, type 2 diabetes, hypertension, obesity, RA not on DMARD, CKD stage IIIa, hypothyroidism, OSA not on CPAP.  Recently admitted 5/13-5/16 for A-fib with RVR and wide-complex tachycardia status post DCCV x 1 with return of NSR.  LHC >showed widely patent left circumflex stent with stable moderate proximal LAD stenosis and no evidence of obstructive disease.  Patient was discharged on metoprolol  25 mg twice daily.  patient's other antihypertensive medications including amlodipine , Lasix , hydrochlorothiazide , and losartan  were discontinued. - Had an outpatient echo which noted EF of 25-30%, grade 2 DD, mild MR -Presented to the ED 6/6 with worsening dyspnea, some edema -In the ED tachypneic tachycardic, hypoxic, creatinine 1.2, troponin 79, 221, BNP 1135, chest x-ray with cardiomegaly pulmonary vascular congestion edema and effusions. - Admitted, started on diuretics, IV amiodarone,, cards following -6/8: Started on Entresto -6/9 onwards, worsening AKI, Entresto and diuretics held - 6/9 converted to sinus rhythm, worsening AKI  Subjective: -Feels a little better, breathing slowly improving  Assessment and Plan:  Acute systolic CHF, new diagnosis Acute hypoxemic respiratory failure -Recent 2D echo PTA noted EF 25-30%, grade 2 DD, mild MR - Diuresed with IV Lasix  initially, diuretics and Entresto held yesterday with worsening AKI - Unfortunately creatinine continues to trend upwards - Suspect cardiomyopathy is secondary to recent A-fib RVR, LHC in May noted stable CAD -PT OT, patient indicates desire for short-term rehab - Monitor off diuretics, wean O2  Atrial fibrillation with RVR - Back and forth with A-fib RVR and sinus rhythm - continue eliquis  -Appears to have converted to sinus rhythm again, now with  bradycardia -Cards following?  Discontinue beta-blocker  Aki on CKD3a -creat trending up, see discussion above, - Holding diuretics and Entresto since yesterday -Check renal ultrasound, bladder scan, avoid hypotension -Monitor urine output, BMP in a.m., will need nephrology involvement if kidney function does not stabilize/improve   Elevated troponin CAD status post PCI to LCx 03/2023 Elevated troponin likely due to demand ischemia rather than ACS.  Troponin 211, 272 - Recent LHC showed widely patent left circumflex stent with stable moderate proximal LAD stenosis and no evidence of obstructive disease.   QT prolongation Monitor electrolytes   Type 2 diabetes A1c 6.6 on 05/09/2024.  Placed on sensitive sliding scale insulin  ACHS.   Hyperlipidemia Continue Crestor .  Hypothyroidism Continue Synthroid .   Chronic pain/neuropathy Continue Lyrica .   DVT prophylaxis: Eliquis  Code Status: Full Code (discussed with the patient) Disposition Plan: PT OT, TBD    Objective: Vitals:   06/06/24 0048 06/06/24 0539 06/06/24 0715 06/06/24 0744  BP: (!) 114/51 139/61  (!) 146/57  Pulse: (!) 47 (!) 47  (!) 50  Resp: 16 17  17   Temp: 97.7 F (36.5 C) 97.8 F (36.6 C)  97.6 F (36.4 C)  TempSrc: Oral Oral  Axillary  SpO2: 92% 100% 100% 100%  Weight:  81.7 kg    Height:        Intake/Output Summary (Last 24 hours) at 06/06/2024 1023 Last data filed at 06/06/2024 0745 Gross per 24 hour  Intake 1443.12 ml  Output 300 ml  Net 1143.12 ml   Filed Weights   06/04/24 0606 06/05/24 0456 06/06/24 0539  Weight: 87.2 kg 84.3 kg 81.7 kg    Examination:  General exam: Appears calm and comfortable obese, AAO x 3  mild distress HEENT: no JVD Respiratory system: Few basilar rales otherwise clear CVS: S1-S2, regular rhythm now Abdomen: Soft, nontender, bowel sounds present  EXTR: trace  edema Skin: No rashes Psychiatry:  Mood & affect appropriate.     Data Reviewed:   CBC: Recent  Labs  Lab 06/02/24 1936 06/02/24 2006 06/04/24 0353  WBC 9.9  --  13.3*  NEUTROABS 7.7  --   --   HGB 12.1 12.2 11.5*  HCT 38.4 36.0 36.5  MCV 87.1  --  84.9  PLT 202  --  239   Basic Metabolic Panel: Recent Labs  Lab 06/02/24 1936 06/02/24 2006 06/03/24 0204 06/04/24 0353 06/05/24 0447 06/06/24 0414  NA 138 140 138 135 136 134*  K 3.6 3.4* 3.4* 4.6 4.0 4.2  CL 106  --  105 103 99 94*  CO2 19*  --  17* 21* 25 30  GLUCOSE 271*  --  298* 170* 184* 185*  BUN 19  --  19 30* 36* 56*  CREATININE 1.29*  --  1.44* 1.58* 2.00* 2.89*  CALCIUM  9.1  --  9.3 9.7 9.3 9.1  MG 2.0  --  1.6*  --   --   --    GFR: Estimated Creatinine Clearance: 16.7 mL/min (A) (by C-G formula based on SCr of 2.89 mg/dL (H)). Liver Function Tests: Recent Labs  Lab 06/02/24 1936  AST 23  ALT 16  ALKPHOS 73  BILITOT 1.0  PROT 6.5  ALBUMIN  3.7   No results for input(s): "LIPASE", "AMYLASE" in the last 168 hours. No results for input(s): "AMMONIA" in the last 168 hours. Coagulation Profile: No results for input(s): "INR", "PROTIME" in the last 168 hours. Cardiac Enzymes: No results for input(s): "CKTOTAL", "CKMB", "CKMBINDEX", "TROPONINI" in the last 168 hours. BNP (last 3 results) No results for input(s): "PROBNP" in the last 8760 hours. HbA1C: No results for input(s): "HGBA1C" in the last 72 hours. CBG: Recent Labs  Lab 06/05/24 0820 06/05/24 1213 06/05/24 1642 06/05/24 2126 06/06/24 0802  GLUCAP 176* 180* 148* 168* 185*   Lipid Profile: No results for input(s): "CHOL", "HDL", "LDLCALC", "TRIG", "CHOLHDL", "LDLDIRECT" in the last 72 hours. Thyroid  Function Tests: No results for input(s): "TSH", "T4TOTAL", "FREET4", "T3FREE", "THYROIDAB" in the last 72 hours. Anemia Panel: No results for input(s): "VITAMINB12", "FOLATE", "FERRITIN", "TIBC", "IRON", "RETICCTPCT" in the last 72 hours. Urine analysis:    Component Value Date/Time   COLORURINE YELLOW 03/15/2017 1140   APPEARANCEUR  CLEAR 03/15/2017 1140   LABSPEC 1.010 03/15/2017 1140   PHURINE 7.0 03/15/2017 1140   GLUCOSEU 50 (A) 03/15/2017 1140   HGBUR NEGATIVE 03/15/2017 1140   BILIRUBINUR NEGATIVE 03/15/2017 1140   KETONESUR NEGATIVE 03/15/2017 1140   PROTEINUR NEGATIVE 03/15/2017 1140   UROBILINOGEN 0.2 09/21/2014 1836   NITRITE NEGATIVE 03/15/2017 1140   LEUKOCYTESUR NEGATIVE 03/15/2017 1140   Sepsis Labs: @LABRCNTIP (procalcitonin:4,lacticidven:4)  )No results found for this or any previous visit (from the past 240 hours).   Radiology Studies: US  RENAL Result Date: 06/06/2024 CLINICAL DATA:  Acute kidney injury. EXAM: RENAL / URINARY TRACT ULTRASOUND COMPLETE COMPARISON:  January 20, 2024. FINDINGS: Right Kidney: Renal measurements: 9.1 x 5.3 x 5.2 cm = volume: 131 mL. Echogenicity within normal limits. No mass or hydronephrosis visualized. Left Kidney: Renal measurements: 11.0 x 4.1 x 4.7 cm = volume: 110 mL. Echogenicity within normal limits. No mass or hydronephrosis visualized. Bladder: Appears normal for degree of bladder distention. Other: None. IMPRESSION: No definite renal abnormality seen. Electronically  Signed   By: Rosalene Colon M.D.   On: 06/06/2024 09:51     Scheduled Meds:  apixaban   5 mg Oral BID   insulin  aspart  0-5 Units Subcutaneous QHS   insulin  aspart  0-9 Units Subcutaneous TID WC   latanoprost   1 drop Both Eyes QHS   levothyroxine   100 mcg Oral Q0600   metoprolol  tartrate  12.5 mg Oral BID   pneumococcal 20-valent conjugate vaccine  0.5 mL Intramuscular Tomorrow-1000   pregabalin   75 mg Oral BID   rosuvastatin   5 mg Oral Once per day on Monday Thursday   Continuous Infusions:  amiodarone 30 mg/hr (06/05/24 2123)      LOS: 4 days    Time spent:    Deforest Fast, MD [facility]   06/06/2024, 10:23 AM

## 2024-06-06 NOTE — Progress Notes (Signed)
 Mobility Specialist Progress Note;   06/06/24 1007  Mobility  Activity Transferred from bed to chair  Level of Assistance Minimal assist, patient does 75% or more  Assistive Device Other (Comment) (HHA)  Distance Ambulated (ft) 5 ft  Activity Response Tolerated well  Mobility Referral Yes  Mobility visit 1 Mobility  Mobility Specialist Start Time (ACUTE ONLY) 1007  Mobility Specialist Stop Time (ACUTE ONLY) 1024  Mobility Specialist Time Calculation (min) (ACUTE ONLY) 17 min   Pt eager for mobility. Required MinA to safely transfer pt form bed to chair via HHA. VSS on 3LO2; HR up to 66 bpm w/ activity. C/o L foot pain throughout, otherwise asx. Pt left in chair with all needs met, call bell in reach.  Janit Meline Mobility Specialist Please contact via SecureChat or [employer] [phone]

## 2024-06-07 ENCOUNTER — Inpatient Hospital Stay (HOSPITAL_COMMUNITY)

## 2024-06-07 ENCOUNTER — Ambulatory Visit: Payer: Self-pay | Admitting: Physician Assistant

## 2024-06-07 DIAGNOSIS — I4891 Unspecified atrial fibrillation: Secondary | ICD-10-CM

## 2024-06-07 DIAGNOSIS — I5023 Acute on chronic systolic (congestive) heart failure: Secondary | ICD-10-CM

## 2024-06-07 DIAGNOSIS — I2510 Atherosclerotic heart disease of native coronary artery without angina pectoris: Secondary | ICD-10-CM

## 2024-06-07 DIAGNOSIS — I2583 Coronary atherosclerosis due to lipid rich plaque: Secondary | ICD-10-CM

## 2024-06-07 DIAGNOSIS — I10 Essential (primary) hypertension: Secondary | ICD-10-CM

## 2024-06-07 DIAGNOSIS — I5041 Acute combined systolic (congestive) and diastolic (congestive) heart failure: Secondary | ICD-10-CM

## 2024-06-07 DIAGNOSIS — E039 Hypothyroidism, unspecified: Secondary | ICD-10-CM

## 2024-06-07 LAB — BASIC METABOLIC PANEL WITH GFR
Anion gap: 12 (ref 5–15)
BUN: 59 mg/dL — ABNORMAL HIGH (ref 8–23)
CO2: 24 mmol/L (ref 22–32)
Calcium: 8.7 mg/dL — ABNORMAL LOW (ref 8.9–10.3)
Chloride: 95 mmol/L — ABNORMAL LOW (ref 98–111)
Creatinine, Ser: 2.59 mg/dL — ABNORMAL HIGH (ref 0.44–1.00)
GFR, Estimated: 18 mL/min — ABNORMAL LOW (ref >60)
Glucose, Bld: 164 mg/dL — ABNORMAL HIGH (ref 70–99)
Potassium: 3.5 mmol/L (ref 3.5–5.1)
Sodium: 131 mmol/L — ABNORMAL LOW (ref 135–145)

## 2024-06-07 LAB — GLUCOSE, CAPILLARY
Glucose-Capillary: 185 mg/dL — ABNORMAL HIGH (ref 70–99)
Glucose-Capillary: 160 mg/dL — ABNORMAL HIGH (ref 70–99)
Glucose-Capillary: 180 mg/dL — ABNORMAL HIGH (ref 70–99)
Glucose-Capillary: 225 mg/dL — ABNORMAL HIGH (ref 70–99)

## 2024-06-07 LAB — LACTIC ACID, PLASMA
Lactic Acid, Venous: 0.9 mmol/L (ref 0.5–1.9)
Lactic Acid, Venous: 1 mmol/L (ref 0.5–1.9)

## 2024-06-07 LAB — CBC
HCT: 36.4 % (ref 36.0–46.0)
Hemoglobin: 11.8 g/dL — ABNORMAL LOW (ref 12.0–15.0)
MCH: 26.8 pg (ref 26.0–34.0)
MCHC: 32.4 g/dL (ref 30.0–36.0)
MCV: 82.5 fL (ref 80.0–100.0)
Platelets: 194 10*3/uL (ref 150–400)
RBC: 4.41 MIL/uL (ref 3.87–5.11)
RDW: 14.4 % (ref 11.5–15.5)
WBC: 6.3 10*3/uL (ref 4.0–10.5)
nRBC: 0 % (ref 0.0–0.2)

## 2024-06-07 LAB — TSH: TSH: 1.716 u[IU]/mL (ref 0.350–4.500)

## 2024-06-07 LAB — MAGNESIUM: Magnesium: 2 mg/dL (ref 1.7–2.4)

## 2024-06-07 MED ORDER — METOPROLOL TARTRATE 12.5 MG HALF TABLET
12.5000 mg | ORAL_TABLET | Freq: Two times a day (BID) | ORAL | Status: DC
  Administered 2024-06-07: 12.5 mg via ORAL
  Filled 2024-06-07: qty 1

## 2024-06-07 MED ORDER — POTASSIUM CHLORIDE CRYS ER 20 MEQ PO TBCR
40.0000 meq | EXTENDED_RELEASE_TABLET | Freq: Once | ORAL | Status: AC
  Administered 2024-06-07: 40 meq via ORAL
  Filled 2024-06-07: qty 2

## 2024-06-07 MED ORDER — SODIUM CHLORIDE 0.9 % IV BOLUS
250.0000 mL | Freq: Once | INTRAVENOUS | Status: AC
  Administered 2024-06-07: 250 mL via INTRAVENOUS

## 2024-06-07 MED ORDER — DICLOFENAC SODIUM 1 % EX GEL
2.0000 g | Freq: Four times a day (QID) | CUTANEOUS | Status: DC
  Administered 2024-06-07 – 2024-06-09 (×9): 2 g via TOPICAL
  Filled 2024-06-07: qty 100

## 2024-06-07 NOTE — Assessment & Plan Note (Addendum)
 Echocardiogram with reduced LV systolic function with EF 25 to 30%, global hypokinesis, mild LVH, RV systolic function with mild reduction, RVSP 36.9 mmHg, LA with moderate dilatation, no significant valvular disease.   Systolic blood pressure 140 mmHg range.   Continue atrial fibrillation rate control with amiodarone .  Possible addition of guideline directed medical therapy with SGLT 2 inh.

## 2024-06-07 NOTE — TOC Initial Note (Signed)
 Transition of Care [facility]) - Initial/Assessment Note    Patient Details  Name: Destiny Brown MRN: [id number] Date of Birth: 01/30/1946  [facility]) CM/SW Contact:    Cosimo Diones, RN Phone Number: 06/07/2024, 11:11 AM  Clinical Narrative:  Patient presented for shortness of breath. PTA patient was from home with spouse. Spouse was at the bedside during the visit. Patient states spouse takes her to PCP appointments and she gets her medications without any issues. Patient states she has a pill box for her meds and takes them appropriately. Case Manager discussed home health services and she is open to PT/OT; declines RN at this time-patient does not have an agency preference. Case Manager spoke with [facility] and they will accept the patient for PT/OT-start of care to begin within 24-48 hours post transition home. Patient will need orders and F2F. Spouse will provide transportation home via private vehicle. Case Manager will continue to follow for additional transition of care needs as the patient progresses.                 Expected Discharge Plan: Home w Home Health Services Barriers to Discharge: No Barriers Identified   Patient Goals and CMS Choice Patient states their goals for this hospitalization and ongoing recovery are:: Plan to return home once HR is stable   Choice offered to / list presented to : Patient (No agency preference- just in network)      Expected Discharge Plan and Services In-house Referral: NA Discharge Planning Services: CM Consult Post Acute Care Choice: Home Health Living arrangements for the past 2 months: Single Family Home                   DME Agency: NA       HH Arranged: PT, OT HH Agency: [facility] Date [facility] Agency Contacted: 06/07/24 Time HH Agency Contacted: 1110 Representative spoke with at [facility] Agency: Bartholomew Light  Prior Living Arrangements/Services Living arrangements for the past 2 months: Single  Family Home Lives with:: Spouse Patient language and need for interpreter reviewed:: Yes Do you feel safe going back to the place where you live?: Yes      Need for Family Participation in Patient Care: Yes (Comment) Care giver support system in place?: Yes (comment)   Criminal Activity/Legal Involvement Pertinent to Current Situation/Hospitalization: No - Comment as needed  Activities of Daily Living   ADL Screening (condition at time of admission) Independently performs ADLs?: Yes (appropriate for developmental age) Is the patient deaf or have difficulty hearing?: No Does the patient have difficulty seeing, even when wearing glasses/contacts?: No Does the patient have difficulty concentrating, remembering, or making decisions?: No  Permission Sought/Granted Permission sought to share information with : Family Supports, Case Production designer, theatre/television/film, Oceanographer granted to share information with : Yes, Verbal Permission Granted     Permission granted to share info w AGENCY: [facility]        Emotional Assessment Appearance:: Appears stated age Attitude/Demeanor/Rapport: Engaged Affect (typically observed): Appropriate Orientation: : Oriented to Self, Oriented to Place, Oriented to  Time, Oriented to Situation Alcohol / Substance Use: Not Applicable Psych Involvement: No (comment)  Admission diagnosis:  Acute CHF (congestive heart failure) (HCC) [I50.9] Acute systolic CHF (congestive heart failure) (HCC) [I50.21] Acute hypoxic respiratory failure (HCC) [J96.01] Patient Active Problem List   Diagnosis Date Noted   Acute systolic CHF (congestive heart failure) (HCC) 06/05/2024   Acute hypoxic respiratory failure (HCC) 06/03/2024  Acute CHF (congestive heart failure) (HCC) 06/02/2024   Sinus bradycardia 05/12/2024   Monitoring for long-term anticoagulant use 05/12/2024   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   Persistent atrial fibrillation (HCC) 05/10/2024    Thyroid  disease 05/10/2024   Atrial fibrillation (HCC) 05/10/2024   Atrial fibrillation with rapid ventricular response (HCC) 05/09/2024   Ventricular tachycardia (HCC) 05/09/2024   Long term (current) use of anticoagulants 05/09/2024   Benign hypertension 05/09/2024   Abnormal findings diagnostic imaging of heart and coronary circulation 05/09/2024   Atherosclerotic heart disease 05/09/2024   Wide-complex tachycardia 05/09/2024   Abnormal CT scan, heart 04/23/2023   CAD (coronary artery disease) 04/20/2023   Preoperative cardiovascular examination 04/20/2023   Chronic cough 02/19/2023   Palpitations 02/02/2023   PAF (paroxysmal atrial fibrillation) (HCC) 08/10/2019   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   Diplopia 01/20/2016   Obesity (BMI 30-39.9) 06/26/2015   OSA (obstructive sleep apnea) 04/09/2015   LBBB (left bundle branch block) 09/22/2014   Elevated troponin 09/22/2014   Atrial fibrillation with RVR (HCC) 09/21/2014   Diabetes type 2, controlled (HCC) 09/21/2014   Migraine headache 09/21/2014   Hypothyroid    Hypertension    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   PCP:  Rosslyn Coons, MD Pharmacy:   [id number] [place], [place] - [address] [facility] [address] [place] [address] Phone: [phone] Fax: [phone]  [id number] [place], [place] - [address] AT [facility] [address] [facility] [address] [place] [address] Phone: [phone] Fax: [phone]  [facility] [address] [place] [address] Phone: [phone] Fax: [phone]     Social Drivers of Health (SDOH) Social History: SDOH Screenings   Food Insecurity: No Food Insecurity (06/03/2024)  Housing: Low Risk  (06/05/2024)  Transportation Needs: No Transportation Needs (06/05/2024)  Utilities: Not At Risk (06/03/2024)  Alcohol Screen: Low Risk  (06/05/2024)  Depression (PHQ2-9):  Low Risk  (08/04/2023)  Financial Resource Strain: Low Risk  (06/05/2024)  Social Connections: Socially Integrated (06/03/2024)  Tobacco Use: Low Risk  (06/03/2024)   SDOH Interventions: Housing Interventions: Intervention Not Indicated Transportation Interventions: Intervention Not Indicated Alcohol Usage Interventions: Intervention Not Indicated (Score <7) Financial Strain Interventions: Intervention Not Indicated   Readmission Risk Interventions     No data to display

## 2024-06-07 NOTE — Progress Notes (Addendum)
 Progress Note  Patient Name: Destiny Brown Date of Encounter: 06/07/2024 [facility] Cardiologist: Richardo Chandler, MD   Interval Summary   Patient denies any symptoms, overall feels fatigued Reentered A. Fib with high rates around 4:30 AM 6/11 Patient denies any chest pain, shortness of breath, palpitations  Vital Signs Vitals:   06/07/24 0050 06/07/24 0351 06/07/24 0519 06/07/24 0741  BP: (!) 125/48 (!) 135/54  (!) 118/93  Pulse:    (!) 146  Resp: 18 18  18   Temp: 98 F (36.7 C) 98.1 F (36.7 C)  98.1 F (36.7 C)  TempSrc: Oral Oral  Oral  SpO2:    100%  Weight:   85.9 kg   Height:        Intake/Output Summary (Last 24 hours) at 06/07/2024 0916 Last data filed at 06/07/2024 0553 Gross per 24 hour  Intake 610.91 ml  Output 400 ml  Net 210.91 ml      06/07/2024    5:19 AM 06/06/2024    5:39 AM 06/05/2024    4:56 AM  Last 3 Weights  Weight (lbs) 189 lb 4.8 oz 180 lb 1.9 oz 185 lb 14.4 oz  Weight (kg) 85.866 kg 81.7 kg 84.324 kg     Telemetry/ECG  Atrial fibrillation with HR 130-140s - Personally Reviewed  Physical Exam  GEN: No acute distress.   Neck: No JVD Cardiac: irregularly irregular, tachycardic.  Respiratory: Clear to auscultation bilaterally. GI: Soft, nontender, non-distended  MS: No edema  Assessment & Plan  Recurrent paroxsymal atrial fibrillation Known history of A. Fib s/p DCCV  Presented with recurrent A. Fib and worsening dyspnea  Converted back to NSR with diuresis then had recurrence Started on IV amiodarone and converted back Transitioned to PO amiodarone, then had recurrence of A. Fib with RVR Re-bolused with IV amiodarone, converted back to NSR 4:30 AM 6/11 converted back to A. Fib with RVR, HR 150s  BP stable Beta-blocker had continued to be held last couple of days, with higher HR will see if re-addition of Lopressor  today aids with rates  Due to recurrence of A. Fib with RVR with rates up to 160s, will increase amiodarone to  60mg /hr Continue Eliquis  5 mg BID    Acute HFrEF Hypoxic respiratory failure Presented with hypoxia and dyspnea Echo 05/2024 showed: EF 25-30%, global hypokinesis LHC 04/2024: non-obstructive CAD BNP 1,135 Started on IV Lasix , Entresto, spironolactone  Net - 1.2 L this admission Renal function declined with above medications Creatinine 1.58 > 2.00 > 2.89 > 2.59 Hold Lasix , Entresto, spironolactone  Currently undergoing evaluation for AKI per primary team    Non-obstructive CAD  Elevated troponin levels  LHC 04/2024: non-obstructive CAD Troponin 211 > 272, likely demand ischemia with CHF exacerbation/A. Fib RVR  Not currently on ASA with Eliquis  use Continue Crestor     For questions or updates, please contact [facility] Please consult [email or web address] for contact info under       Signed, Jiles Mote, PA-C   Patient seen and examined with TP PA-C.  Agree as above, with the following exceptions and changes as noted below.  She had recurrent atrial fibrillation with rapid ventricular response overnight and her amiodarone rate was increased again.  We also attempted to give her metoprolol  this morning but she is now experiencing hypotension.  Family on the phone suggested to her that she is slurring her speech.  This is most likely due to hypotension, however low threshold to perform CT head which we  will order now given family's preference.. Gen: NAD, CV: iRRR, no murmurs, Lungs: clear, Abd: soft, Extrem: Warm, well perfused, no edema, Neuro/Psych: alert and oriented x 3, normal mood and affect. All available labs, radiology testing, previous records reviewed.  She remains warm on exam but does have a reduced ejection fraction likely tachycardia mediated cardiomyopathy.  Urine output appears minimal yesterday though while receiving diuresis had robust output 2.5 L on 06/04/2024.  Given hypotension and AKI will give fluid bolus and monitor urine output.  Check lactate given reduced  ejection fraction.  TSH was checked in the setting of recurrent atrial fibrillation while she is on thyroid  supplementation, TSH 1.7. -Hold metoprolol  -500 cc bolus -Slow rate of amiodarone to 30/hr if there is any contribution from the amiodarone to hypotension -Rates in A-fib occasionally spike, but she has had conversion to sinus rhythm several times during this hospital admission thus far.  We may want to consider involvement of EP. -Will consider a single dose of digoxin given renal dysfunction and recheck level in 48 hours, could consider a dose of 0.125 mg if needed for additional rate control while hypotensive -Checking lactate given low EF and hypotension, no definite evidence of cardiogenic shock at this time -Given slurred speech, CT head.  Lankford Gutzmer A Ciani Rutten, MD 06/07/24 11:47 AM

## 2024-06-07 NOTE — Progress Notes (Signed)
  Progress Note   Patient: Destiny Brown ZOX:[id number] DOB: [birth year] DOA: 06/02/2024     5 DOS: the patient was seen and examined on 06/07/2024   Brief hospital course: Mrs. Yakel was admitted to the hospital with the working diagnosis of heart failure decompensation.   78/F w paroxysmal A-fib on Eliquis , CAD status  PCI to LCx 03/2023, LBBB, type 2 diabetes, hypertension, obesity, RA not on DMARD, CKD stage IIIa, hypothyroidism, OSA not on CPAP.  Recently admitted 5/13-5/16 for A-fib with RVR and wide-complex tachycardia status post DCCV x 1 with return of NSR.  LHC >showed widely patent left circumflex stent with stable moderate proximal LAD stenosis and no evidence of obstructive disease.  Patient was discharged on metoprolol  25 mg twice daily.  patient's other antihypertensive medications including amlodipine , Lasix , hydrochlorothiazide , and losartan  were discontinued. - Had an outpatient echo which noted EF of 25-30%, grade 2 DD, mild MR -Presented to the ED 6/6 with worsening dyspnea, some edema -In the ED tachypneic tachycardic, hypoxic, creatinine 1.2, troponin 79, 221, BNP 1135, chest x-ray with cardiomegaly pulmonary vascular congestion edema and effusions. - Admitted, started on diuretics, IV amiodarone,, cards following -6/8: Started on Entresto -6/9 onwards, worsening AKI, Entresto and diuretics held - 6/9 converted to sinus rhythm, worsening AKI  Assessment and Plan: * Acute on chronic systolic CHF (congestive heart failure) (HCC) Echocardiogram with reduced LV systolic function with EF 25 to 30%, global hypokinesis, mild LVH, RV systolic function with mild reduction, RVSP 36.9 mmHg, LA with moderate dilatation, no significant valvular disease.   Urine output is 400 cc Systolic blood pressure 100 mmHg range.   Plan to continue blood pressure monitoring/  Holding on diuretic for now.   Atrial fibrillation (HCC) Continue amiodarone for rate control.  Telemetry monitor  continue atrial fibrillation Anticoagulation with apixaban .   CAD (coronary artery disease) No chest pain.  No acute coronary syndrome.   Continue statin for dyslipidemia.   Hypertension Continue blood pressure monitoring   Hypothyroidism Continue levothyroxine .    Subjective: patient is feeling weak and deconditioned, non focal neurologic deficit. Positive left ankle pain   Physical Exam: Vitals:   06/07/24 1100 06/07/24 1105 06/07/24 1109 06/07/24 1223  BP: 91/62 (!) 103/92 (!) 85/75 95/85  Pulse: 100 (!) 42 100 (!) 115  Resp:    18  Temp:    98 F (36.7 C)  TempSrc:    Oral  SpO2: 94% 98% 95% 96%  Weight:      Height:       Neurology awake and alert ENT with mild pallor Cardiovascular with S1 and S2 present and regular with no gallops or rubs No JVD Respiratory with mild rales at bases with no wheezing or rhonchi.  Abdomen with no distention, soft and non tender No lower extremity edema   Data Reviewed:    Family Communication: I spoke with patient's husband and grandson at the bedside, we talked in detail about patient's condition, plan of care and prognosis and all questions were addressed.   Disposition: Status is: Inpatient Remains inpatient appropriate because: rate control atrial fibrillation   Planned Discharge Destination: Home    Author: Albertus Alt, MD 06/07/2024 1:46 PM  For on call review [email or web address].

## 2024-06-07 NOTE — Progress Notes (Signed)
   Heart Failure Stewardship Pharmacist Progress Note   PCP: Rosslyn Coons, MD PCP-Cardiologist: Richardo Chandler, MD    HPI:  60 YOF with PMH of paroxysmal Afib on Eliquis , CAD s/p PCI to LCX 03/2023, LBBB, T2DM, HTN, obesity, RA, CKD, hypothyroidism, OSA.  Patient came to the ED following PCP appointment where she endorsed SOB, and underwent ECHO with new found LVEF 25-30%.  Outpatient ECHO showed LVEF 25-30%, LV severely decreased function, global hypokinesis, mild LV hypertrophy, grade II diastolic dysfunction, and L wave mitral inflow pattern. RV function mildly reduced, Mild calcification of aortic valve without evidence of aortic stenosis. L heart cath 05/11/2024 showed widely patent LCX with moderate proximal LAD stenosis without evidence of obstructive disease.  Previous ECHO 02/24/2023 showed LVEF 55-60% with normal LV function, mild hypertrophy, grade I diastolic dysfunction. Has been in and out of afib RVR this admission. Underwent chemical cardioversion with amiodarone 6/9 and continued with IV amiodarone through 6/10. Patient went back into Afib early 6/11. Remains on IV amiodarone with rate increase.  Upon physical exam, no lower extremity swelling present. Pt denies having SOB, chest pain/tightness while at rest. Has not been able to get up and walk due to Afib.   Current HF Medications: Beta Blocker: metoprolol  tartrate 12.5 mg BID   Prior to admission HF Medications: Diuretic: furosemide  20 mg PO daily Beta blocker: metoprolol  tartrate 25 mg BID ACE/ARB/ARNI: losartan  100 mg daily Other: amlodipine  5 mg daily Other: hydrochlorothiazide  25 mg daily   Pertinent Lab Values: Serum creatinine 2.89>2.59, BUN 56>59, Potassium 3.5, Sodium 131, BNP 1,135.6, Magnesium  1.6 (6/7), A1c 6.6  Vital Signs: Weight: 189 lbs (admission weight: 193 lbs) Blood pressure: 110-130/90's Heart rate: 137 (went back into Afib early this AM)  I/O: net +0.8L yesterday; net -0.8L since admission -  incomplete  Medication Assistance / Insurance Benefits Check: Does the patient have prescription insurance?  Yes Type of insurance plan: [employer]  Does the patient qualify for medication assistance through manufacturers or grants?   Pending  Outpatient Pharmacy:  [facility] [address]  Is the patient willing to use [facility] TOC pharmacy at discharge? Pending Is the patient willing to transition their outpatient pharmacy to utilize a [facility] outpatient pharmacy?   Pending    Assessment: 1. Acute on chronic systolic CHF (LVEF 25-30%), presumed secondary to Afib RVR. NYHA class III symptoms. - Continue metoprolol   - Continue to hold Entresto in setting of AKI - Continue to hold spironolactone in setting of AKI - Daily Weight. Strict I/O's - Keep K > 4, Mg > 2   Plan: 1) Medication changes recommended at this time: - Recommend repeat Mg lab - Recommend 40 mEq potassium PO x 1  2) Patient assistance: - Ineligible for co-pay cards - Entresto co-pay $47  3)  Education  - Initial education provided - To be completed prior to discharge  [name], PharmD Candidate 2026 [employer] 06/07/2024 2:01 PM

## 2024-06-07 NOTE — Assessment & Plan Note (Addendum)
 Continue blood pressure monitoring, blood pressure has improved.  Patient will benefit from RAAS inhibition, to start as outpatient. At the time of discharge will do low dose amlodipine 

## 2024-06-07 NOTE — Assessment & Plan Note (Addendum)
 Patient converted to sinus rhythm, rate 50 bpm Transition to po amiodarone ,  Anticoagulation with apixaban .  Continue telemetry

## 2024-06-07 NOTE — Hospital Course (Addendum)
 Mrs. Destiny Brown was admitted to the hospital with the working diagnosis of heart failure decompensation.   78/F w paroxysmal A-fib on Eliquis , CAD status  PCI to LCx 03/2023, LBBB, type 2 diabetes, hypertension, obesity, RA not on DMARD, CKD stage IIIa, hypothyroidism, OSA not on CPAP.  Recently admitted 5/13-5/16 for A-fib with RVR and wide-complex tachycardia status post DCCV x 1 with return of NSR.  LHC >showed widely patent left circumflex stent with stable moderate proximal LAD stenosis and no evidence of obstructive disease.  Patient was discharged on metoprolol  25 mg twice daily.  patient's other antihypertensive medications including amlodipine , Lasix , hydrochlorothiazide , and losartan  were discontinued. - Had an outpatient echo which noted EF of 25-30%, grade 2 DD, mild MR -Presented to the ED 6/6 with worsening dyspnea, some edema -In the ED tachypneic tachycardic, hypoxic, creatinine 1.2, troponin 79, 221, BNP 1135, chest x-ray with cardiomegaly pulmonary vascular congestion edema and effusions.  - Admitted, started on diuretics, IV amiodarone  -6/8: Started on Entresto  -6/9 onwards, worsening AKI, Entresto  and diuretics held - 6/9 converted to sinus rhythm, worsening AKI 06/12 patient converted to sinus rhythm.  06/13 transitioned to po amiodarone 

## 2024-06-07 NOTE — Progress Notes (Signed)
 Lactate normal. Head CT without acute process. 500 cc bolus given.  BP remains low.  Monitor.  Syrita Dovel A Artina Minella, MD

## 2024-06-07 NOTE — Progress Notes (Signed)
 Around 4:30AM, Pt converted to Afib HR 120-140 and BP:111/69. Pt denies any symptoms. Notified cardiology oncall.  Merle Starcher RN

## 2024-06-07 NOTE — Progress Notes (Signed)
   06/07/24 0811  Assess: MEWS Score  BP (!) 86/65  MAP (mmHg) 73  Pulse Rate (!) 129  ECG Heart Rate (!) 141  SpO2 95 %  Assess: MEWS Score  MEWS Temp 0  MEWS Systolic 1  MEWS Pulse 3  MEWS RR 0  MEWS LOC 0  MEWS Score 4  MEWS Score Color Red  Assess: if the MEWS score is Yellow or Red  Were vital signs accurate and taken at a resting state? Yes  Does the patient meet 2 or more of the SIRS criteria? No  MEWS guidelines implemented  Yes, red  Treat  MEWS Interventions Considered administering scheduled or prn medications/treatments as ordered  Take Vital Signs  Increase Vital Sign Frequency  Red: Q1hr x2, continue Q4hrs until patient remains green for 12hrs  Escalate  MEWS: Escalate Red: Discuss with charge nurse and notify provider. Consider notifying RRT. If remains red for 2 hours consider need for higher level of care  Provider Notification  Provider Name/Title PA Laquita Plant  Date Provider Notified 06/07/24  Time Provider Notified 1050  Method of Notification Page  Notification Reason Change in status (BP 73/39 with PT)  Provider response En route (metoprolol  dc'd, dr Chancy Comber to see pt soon)  Date of Provider Response 06/07/24  Time of Provider Response 1100  Assess: SIRS CRITERIA  SIRS Temperature  0  SIRS Respirations  0  SIRS Pulse 1  SIRS WBC 0  SIRS Score Sum  1

## 2024-06-07 NOTE — Progress Notes (Signed)
 Physical Therapy Treatment Patient Details Name: Destiny Brown MRN: [id number] DOB: 05-31-1946 Today's Date: 06/07/2024   History of Present Illness Pt is a 78 y.o female readmitted 6/6 with SOB after recent admission 5/13-16 for afib with RVR.  PMHx:   paroxysmal A-fib on Eliquis , CAD status post PCI to LCx 03/2023, LBBB, type 2 diabetes, hypertension, obesity, RA not on DMARD, CKD stage IIIa, hypothyroidism, OSA not on CPAP    PT Comments  Pt HR in 170s earlier this morning, but in 110s. RN agreeable to working with therapy with caveat of watching BP secondary to meds administered for elevated HR. Pt BP 73/39 (49) on entry, HR in 110s. PT employed LE bed level exercises and after 10 min BP 91/62(73), finished bed level exercise and BP 103/92 (98). RN in room at end of session. D/c plan remains appropriate at this time once cardiovascular issues are properly managed. PT will continue to follow acutely.     If plan is discharge home, recommend the following: A little help with walking and/or transfers;A little help with bathing/dressing/bathroom;Assistance with cooking/housework;Assist for transportation   Can travel by private vehicle      Yes  Equipment Recommendations  None recommended by PT    Recommendations for Other Services       Precautions / Restrictions Precautions Precautions:  (RVR) Recall of Precautions/Restrictions: Intact Restrictions Weight Bearing Restrictions Per Provider Order: No           Communication Communication Communication: No apparent difficulties;Impaired Factors Affecting Communication: Reduced clarity of speech (slightly slurred on entry cleared with bed level exercise)  Cognition Arousal: Alert Behavior During Therapy: WFL for tasks assessed/performed   PT - Cognitive impairments: No apparent impairments                         Following commands: Intact      Cueing Cueing Techniques: Verbal cues  Exercises General Exercises -  Lower Extremity Ankle Circles/Pumps: AROM, Both, 15 reps, Supine Quad Sets: AROM, Both, 10 reps, Supine Gluteal Sets: AROM, Both, 10 reps, Supine Heel Slides: AROM, Both, 10 reps, Supine Hip ABduction/ADduction: AROM, Both, 10 reps, Supine Straight Leg Raises: AROM, Both, 10 reps, Supine    General Comments General comments (skin integrity, edema, etc.): HR 110-low 130s, BP initially in supine 73/39 (49), with bed level exercise increased to 91/62 (73) at end of LE exercises 103/92 (98)      Pertinent Vitals/Pain Pain Assessment Pain Assessment: 0-10 Faces Pain Scale: Hurts little more Pain Location: L great toe and ankle with movement Pain Descriptors / Indicators: Aching, Discomfort, Grimacing Pain Intervention(s): Limited activity within patient's tolerance, Monitored during session, Repositioned     PT Goals (current goals can now be found in the care plan section) Acute Rehab PT Goals Patient Stated Goal: able to get back to volunteering,  Independent PT Goal Formulation: With patient Time For Goal Achievement: 06/19/24 Potential to Achieve Goals: Good Progress towards PT goals: Not progressing toward goals - comment    Frequency    Min 3X/week       AM-PAC PT 6 Clicks Mobility   Outcome Measure  Help needed turning from your back to your side while in a flat bed without using bedrails?: A Little Help needed moving from lying on your back to sitting on the side of a flat bed without using bedrails?: A Little Help needed moving to and from a bed to a chair (including a wheelchair)?: A  Little Help needed standing up from a chair using your arms (e.g., wheelchair or bedside chair)?: A Little Help needed to walk in hospital room?: A Little Help needed climbing 3-5 steps with a railing? : A Little 6 Click Score: 18    End of Session   Activity Tolerance: Treatment limited secondary to medical complications (Comment) (low BP) Patient left: in bed;with call  bell/phone within reach;with family/visitor present;with nursing/sitter in room Nurse Communication: Other (comment) (low BP, possible gout in L foot) PT Visit Diagnosis: Other abnormalities of gait and mobility (R26.89)     Time: 1610-9604 PT Time Calculation (min) (ACUTE ONLY): 16 min  Charges:    $Therapeutic Exercise: 8-22 mins PT General Charges $$ ACUTE PT VISIT: 1 Visit                     Milton Sagona B. Jewel Mortimer PT, DPT Acute Rehabilitation Services Please use secure chat or  Call Office [phone]    Verlie Glisson [facility] 06/07/2024, 11:17 AM

## 2024-06-07 NOTE — Assessment & Plan Note (Signed)
 Continue levothyroxine 

## 2024-06-07 NOTE — Assessment & Plan Note (Addendum)
 No chest pain.  No acute coronary syndrome.   Continue statin for dyslipidemia.

## 2024-06-08 DIAGNOSIS — N184 Chronic kidney disease, stage 4 (severe): Secondary | ICD-10-CM

## 2024-06-08 DIAGNOSIS — E1169 Type 2 diabetes mellitus with other specified complication: Secondary | ICD-10-CM

## 2024-06-08 DIAGNOSIS — I5023 Acute on chronic systolic (congestive) heart failure: Secondary | ICD-10-CM | POA: Diagnosis not present

## 2024-06-08 DIAGNOSIS — E66811 Obesity, class 1: Secondary | ICD-10-CM

## 2024-06-08 DIAGNOSIS — I2510 Atherosclerotic heart disease of native coronary artery without angina pectoris: Secondary | ICD-10-CM | POA: Diagnosis not present

## 2024-06-08 DIAGNOSIS — I4891 Unspecified atrial fibrillation: Secondary | ICD-10-CM | POA: Diagnosis not present

## 2024-06-08 DIAGNOSIS — I10 Essential (primary) hypertension: Secondary | ICD-10-CM | POA: Diagnosis not present

## 2024-06-08 LAB — GLUCOSE, CAPILLARY
Glucose-Capillary: 190 mg/dL — ABNORMAL HIGH (ref 70–99)
Glucose-Capillary: 141 mg/dL — ABNORMAL HIGH (ref 70–99)
Glucose-Capillary: 146 mg/dL — ABNORMAL HIGH (ref 70–99)
Glucose-Capillary: 156 mg/dL — ABNORMAL HIGH (ref 70–99)

## 2024-06-08 LAB — BASIC METABOLIC PANEL WITH GFR
Anion gap: 13 (ref 5–15)
BUN: 57 mg/dL — ABNORMAL HIGH (ref 8–23)
CO2: 23 mmol/L (ref 22–32)
Calcium: 8.8 mg/dL — ABNORMAL LOW (ref 8.9–10.3)
Chloride: 96 mmol/L — ABNORMAL LOW (ref 98–111)
Creatinine, Ser: 2.54 mg/dL — ABNORMAL HIGH (ref 0.44–1.00)
GFR, Estimated: 19 mL/min — ABNORMAL LOW (ref >60)
Glucose, Bld: 150 mg/dL — ABNORMAL HIGH (ref 70–99)
Potassium: 4 mmol/L (ref 3.5–5.1)
Sodium: 132 mmol/L — ABNORMAL LOW (ref 135–145)

## 2024-06-08 NOTE — Assessment & Plan Note (Addendum)
 AKI Hyponatremia.   Renal function with serum cr at 2,0 with K at 4,5 and serum bicarbonate at 22  Na 135 and P 3,0   Plan to follow up renal function as outpatient.

## 2024-06-08 NOTE — Progress Notes (Addendum)
 Progress Note   Patient: Destiny Brown UEA:[id number] DOB: 1946/02/14 DOA: 06/02/2024     6 DOS: the patient was seen and examined on 06/08/2024   Brief hospital course: Mrs. Hewlett was admitted to the hospital with the working diagnosis of heart failure decompensation.   78/F w paroxysmal A-fib on Eliquis , CAD status  PCI to LCx 03/2023, LBBB, type 2 diabetes, hypertension, obesity, RA not on DMARD, CKD stage IIIa, hypothyroidism, OSA not on CPAP.  Recently admitted 5/13-5/16 for A-fib with RVR and wide-complex tachycardia status post DCCV x 1 with return of NSR.  LHC >showed widely patent left circumflex stent with stable moderate proximal LAD stenosis and no evidence of obstructive disease.  Patient was discharged on metoprolol  25 mg twice daily.  patient's other antihypertensive medications including amlodipine , Lasix , hydrochlorothiazide , and losartan  were discontinued. - Had an outpatient echo which noted EF of 25-30%, grade 2 DD, mild MR -Presented to the ED 6/6 with worsening dyspnea, some edema -In the ED tachypneic tachycardic, hypoxic, creatinine 1.2, troponin 79, 221, BNP 1135, chest x-ray with cardiomegaly pulmonary vascular congestion edema and effusions. - Admitted, started on diuretics, IV amiodarone,, cards following -6/8: Started on Entresto -6/9 onwards, worsening AKI, Entresto and diuretics held - 6/9 converted to sinus rhythm, worsening AKI  Assessment and Plan: * Acute on chronic systolic CHF (congestive heart failure) (HCC) Echocardiogram with reduced LV systolic function with EF 25 to 30%, global hypokinesis, mild LVH, RV systolic function with mild reduction, RVSP 36.9 mmHg, LA with moderate dilatation, no significant valvular disease.   Urine output is 650 cc Systolic blood pressure 130 mmHg range.   Blood pressure has improved.  Holding on diuretic for now.   Atrial fibrillation (HCC) Patient converted to sinus rhythm, rate 50 bpm Continue with amiodarone IV  load.  Anticoagulation with apixaban .  Continue telemetry   CAD (coronary artery disease) No chest pain.  No acute coronary syndrome.   Continue statin for dyslipidemia.   Hypertension Continue blood pressure monitoring   Chronic kidney disease, stage 4 (severe) (HCC) Hyponatremia.   Renal function with serum cr at 2,89 with K at 4,2 with serum bicarbonate at 30  Na 134   Continue close follow up renal function, avoid hypotension or nephrotoxic medications.   Hypothyroidism Continue levothyroxine .   Type 2 diabetes mellitus with hyperlipidemia (HCC) Continue glucose cover and monitoring with insulin  sliding scale Fasting glucose is 150 mg/dl   Continue statin therapy   Obesity, class 1 Calculated BMI is 31.6      Subjective: Patient is feeling better, no chest pain, no dyspnea, no palpitations, no lower extremity edema, PND or orthopnea   Physical Exam: Vitals:   06/07/24 2359 06/08/24 0404 06/08/24 0500 06/08/24 0746  BP: (!) 115/46 (!) 125/50  (!) 133/52  Pulse: (!) 50 (!) 50  (!) 50  Resp: 17 16  17   Temp: 98 F (36.7 C) 97.9 F (36.6 C)  97.6 F (36.4 C)  TempSrc: Oral Oral  Axillary  SpO2:  95%  100%  Weight:   86.3 kg   Height:       Neurology awake and alert ENT with mild pallor with no icterus Cardiovascular with S1 and S2 present and regular with no gallops, rubs or murmurs No JVD Respiratory with no rales or wheezing, no rhonchi  Abdomen with no distention  No lower extremity edema   Data Reviewed:    Family Communication: I spoke with patient's husband at the bedside, we talked in  detail about patient's condition, plan of care and prognosis and all questions were addressed.   Disposition: Status is: Inpatient Remains inpatient appropriate because: telemetry and Iv amiodarone load.   Planned Discharge Destination: Home   Author: Albertus Alt, MD 06/08/2024 3:16 PM  For on call review [email or web address].

## 2024-06-08 NOTE — Progress Notes (Signed)
 Mobility Specialist Progress Note:    06/08/24 1229  Mobility  Activity Transferred to/from [facility];Transferred from bed to chair  Level of Assistance Minimal assist, patient does 75% or more  Assistive Device Other (Comment) (HHA)  Distance Ambulated (ft) 8 ft  Activity Response Tolerated well  Mobility Referral Yes  Mobility visit 1 Mobility  Mobility Specialist Start Time (ACUTE ONLY) 1135  Mobility Specialist Stop Time (ACUTE ONLY) 1201  Mobility Specialist Time Calculation (min) (ACUTE ONLY) 26 min   Pt received in bed, agreeable to mobility. Pt required minA to sit EOB and STS. Pt requested to use the [facility]. Pt had BM and voided. Pt able to ambulate around the bed to chair w/o fault w/ HHA d/t slight unsteadiness. No c/o during ambulation. Left pt in chair. Personal belongings and call light w/in reach. All needs met. Visitor in room.  During Mobility: HR - 52-58  Post Mobility: BP - 133/ 64 (83)  [place] Mobility Specialist  Please contact via [employer] or  Rehab Office [phone]

## 2024-06-08 NOTE — Assessment & Plan Note (Signed)
Calculated BMI is 31.6

## 2024-06-08 NOTE — Assessment & Plan Note (Addendum)
 Patient was placed on insulin  sliding scale for glucose cover and monitoring. Fasting glucose is 145 mg/dl  Resume metformin  at the time of discharge.   Continue statin therapy

## 2024-06-08 NOTE — Plan of Care (Signed)
  Problem: Clinical Measurements: Goal: Respiratory complications will improve Outcome: Progressing Goal: Cardiovascular complication will be avoided Outcome: Progressing   Problem: Nutrition: Goal: Adequate nutrition will be maintained Outcome: Progressing   Problem: Safety: Goal: Ability to remain free from injury will improve Outcome: Progressing   Problem: Cardiac: Goal: Ability to achieve and maintain adequate cardiopulmonary perfusion will improve Outcome: Progressing   Problem: Education: Goal: Knowledge of disease or condition will improve Outcome: Progressing   Problem: Cardiac: Goal: Ability to achieve and maintain adequate cardiopulmonary perfusion will improve Outcome: Progressing

## 2024-06-08 NOTE — Progress Notes (Signed)
   Heart Failure Stewardship Pharmacist Progress Note   PCP: Rosslyn Coons, MD PCP-Cardiologist: Richardo Chandler, MD    HPI:  46 YOF with PMH of paroxysmal Afib on Eliquis , CAD s/p PCI to LCX 03/2023, LBBB, T2DM, HTN, obesity, RA, CKD, hypothyroidism, OSA.  Patient came to the ED following PCP appointment where she endorsed SOB, and underwent ECHO with new found LVEF 25-30%.  Outpatient ECHO showed LVEF 25-30%, LV severely decreased function, global hypokinesis, mild LV hypertrophy, grade II diastolic dysfunction, and L wave mitral inflow pattern. RV function mildly reduced, Mild calcification of aortic valve without evidence of aortic stenosis. L heart cath 05/11/2024 showed widely patent LCX with moderate proximal LAD stenosis without evidence of obstructive disease.  Previous ECHO 02/24/2023 showed LVEF 55-60% with normal LV function, mild hypertrophy, grade I diastolic dysfunction. Has been in and out of afib RVR this admission. Underwent chemical cardioversion with amiodarone 6/9 and continued with IV amiodarone through 6/10. Patient went back into Afib early 6/11. Remains on IV amiodarone with rate increase. Back into sinus rhythm 6/11 early afternoon, still on IV amiodarone. Upon exam, no visual swelling, patient reports being able to walk with PT without SOB, chest pain / tightness. Says she is feeling better compared to previous days.  Current HF Medications: None   Prior to admission HF Medications: Diuretic: furosemide  20 mg PO daily Beta blocker: metoprolol  tartrate 25 mg BID ACE/ARB/ARNI: losartan  100 mg daily Other: amlodipine  5 mg daily Other: hydrochlorothiazide  25 mg daily   Pertinent Lab Values: Serum creatinine 2.89>2.59>2.54, BUN 59>57, Potassium 4.0, Sodium 132, BNP 1,135.6, Magnesium  2.0 (6/11), A1c 6.6  Vital Signs: Weight: 190 lbs (admission weight: 193 lbs) Blood pressure: 115-125/50's Heart rate: 50's  I/O: net +0.2L yesterday; net -0.3L since admission -  incomplete  Medication Assistance / Insurance Benefits Check: Does the patient have prescription insurance?  Yes Type of insurance plan: [employer]  Does the patient qualify for medication assistance through manufacturers or grants?   Pending  Outpatient Pharmacy:  [facility] [address]  Is the patient willing to use [facility] TOC pharmacy at discharge? Pending Is the patient willing to transition their outpatient pharmacy to utilize a [facility] outpatient pharmacy?   Pending    Assessment: 1. Acute on chronic systolic CHF (LVEF 25-30%), presumed secondary to Afib RVR. NYHA class II symptoms. - Continue to hold metoprolol  with low HR - Continue to hold Entresto in setting of AKI - Continue to hold spironolactone in setting of AKI - Daily Weight. Strict I/O's - Keep K > 4, Mg > 2   Plan: 1) Medication changes recommended at this time: - If BP can tolerate, consider Entresto 24/26 mg BID as Scr improves - Will continue to monitor for transition to PO amiodarone and assess need for metoprolol .  2) Patient assistance: - Ineligible for co-pay cards - Entresto co-pay $47  3)  Education  - Initial education provided - To be completed prior to discharge  [name], PharmD Candidate 2026 [facility] [employer] 06/08/2024 2:04 PM

## 2024-06-08 NOTE — Progress Notes (Signed)
 Physical Therapy Treatment Patient Details Name: Destiny Brown MRN: [id number] DOB: [birth year] Today's Date: 06/08/2024   History of Present Illness Pt is a 77 y.o female readmitted 6/6 with SOB after recent admission 5/13-16 for afib with RVR.  PMHx:   paroxysmal A-fib on Eliquis , CAD status post PCI to LCx 03/2023, LBBB, type 2 diabetes, hypertension, obesity, RA not on DMARD, CKD stage IIIa, hypothyroidism, OSA not on CPAP    PT Comments  Pt BP and HR in good range for PT session (see General Comments). Pt sitting up in recliner, having just finished lunch and is eager to get up and walk with therapy. Pt is currently supervision for transfers and contact guard for walking with RW in hallway. Pt reports switching to oral medications tomorrow, in preparation to go home, but also reports she has to do well with them for at least 2 days to be able to discharge. D/c plans remain appropriate at this time. PT will continue to follow acutely.     If plan is discharge home, recommend the following: A little help with walking and/or transfers;A little help with bathing/dressing/bathroom;Assistance with cooking/housework;Assist for transportation   Can travel by private vehicle      Yes  Equipment Recommendations  None recommended by PT       Precautions / Restrictions Precautions Precautions:  (RVR) Recall of Precautions/Restrictions: Intact Restrictions Weight Bearing Restrictions Per Provider Order: No     Mobility  Bed Mobility               General bed mobility comments: sitting up in recliner on entry    Transfers Overall transfer level: Needs assistance Equipment used: Rolling walker (2 wheels) Transfers: Sit to/from Stand, Bed to chair/wheelchair/BSC Sit to Stand: Supervision           General transfer comment: supervision for safety, good hand placement for power up and able to self steady in RW    Ambulation/Gait Ambulation/Gait assistance: Contact guard  assist Gait Distance (Feet): 250 Feet Assistive device: Rolling walker (2 wheels) Gait Pattern/deviations: Step-through pattern, Shuffle, Decreased step length - right, Decreased step length - left Gait velocity: sloweds Gait velocity interpretation: <1.8 ft/sec, indicate of risk for recurrent falls   General Gait Details: contact guard for safety with slowed shuffling gait         Balance Overall balance assessment: Needs assistance Sitting-balance support: No upper extremity supported, Feet supported Sitting balance-Leahy Scale: Good     Standing balance support: Bilateral upper extremity supported, Single extremity supported, During functional activity Standing balance-Leahy Scale: Fair Standing balance comment: requires UE support for dynamic activities                            Communication Communication Communication: No apparent difficulties;Impaired Factors Affecting Communication: Reduced clarity of speech (slightly slurred on entry cleared with bed level exercise)  Cognition Arousal: Alert Behavior During Therapy: WFL for tasks assessed/performed   PT - Cognitive impairments: No apparent impairments                         Following commands: Intact      Cueing Cueing Techniques: Verbal cues     General Comments General comments (skin integrity, edema, etc.): HR in 52 at rest, max noted 62 with ambulation, SpO2 on RA >90%O2 with ambulation, BP at rest 133/64 (83) after ambulation 125/60 (78)      Pertinent Vitals/Pain  Pain Assessment Pain Assessment: 0-10 Faces Pain Scale: Hurts a little bit Pain Location: L great toe and ankle with movement Pain Descriptors / Indicators: Aching, Discomfort, Grimacing Pain Intervention(s): Limited activity within patient's tolerance, Monitored during session, Repositioned    Home Living                          Prior Function            PT Goals (current goals can now be found in  the care plan section) Acute Rehab PT Goals PT Goal Formulation: With patient Time For Goal Achievement: 06/19/24 Potential to Achieve Goals: Good Progress towards PT goals: Progressing toward goals    Frequency    Min 3X/week       AM-PAC PT 6 Clicks Mobility   Outcome Measure  Help needed turning from your back to your side while in a flat bed without using bedrails?: A Little Help needed moving from lying on your back to sitting on the side of a flat bed without using bedrails?: A Little Help needed moving to and from a bed to a chair (including a wheelchair)?: A Little Help needed standing up from a chair using your arms (e.g., wheelchair or bedside chair)?: A Little Help needed to walk in hospital room?: A Little Help needed climbing 3-5 steps with a railing? : A Little 6 Click Score: 18    End of Session   Activity Tolerance: Patient tolerated treatment well Patient left: with call bell/phone within reach;with family/visitor present;in chair Nurse Communication: Mobility status PT Visit Diagnosis: Other abnormalities of gait and mobility (R26.89)     Time: 2841-3244 PT Time Calculation (min) (ACUTE ONLY): 40 min  Charges:    $Gait Training: 23-37 mins $Therapeutic Activity: 8-22 mins PT General Charges $$ ACUTE PT VISIT: 1 Visit                     Destiny Brown B. Jewel Mortimer PT, DPT Acute Rehabilitation Services Please use secure chat or  Call Office [phone]    Verlie Glisson [facility] 06/08/2024, 1:35 PM

## 2024-06-08 NOTE — Progress Notes (Addendum)
 Progress Note  Patient Name: Destiny Brown Date of Encounter: 06/08/2024 [facility] Cardiologist: Richardo Chandler, MD   Interval Summary   Patient resting in bed, husband in room  Reports no acute events overnight Currently in sinus brady with HR 40-50s, feels good no symptoms Continue IV amiodarone today and observe with hopes to transition to PO tomorrow   Vital Signs Vitals:   06/07/24 2359 06/08/24 0404 06/08/24 0500 06/08/24 0746  BP: (!) 115/46 (!) 125/50  (!) 133/52  Pulse: (!) 50 (!) 50  (!) 50  Resp: 17 16  17   Temp: 98 F (36.7 C) 97.9 F (36.6 C)  97.6 F (36.4 C)  TempSrc: Oral Oral  Axillary  SpO2:  95%  100%  Weight:   86.3 kg   Height:        Intake/Output Summary (Last 24 hours) at 06/08/2024 1036 Last data filed at 06/08/2024 0742 Gross per 24 hour  Intake 1007.21 ml  Output 650 ml  Net 357.21 ml      06/08/2024    5:00 AM 06/07/2024    5:19 AM 06/06/2024    5:39 AM  Last 3 Weights  Weight (lbs) 190 lb 4.1 oz 189 lb 4.8 oz 180 lb 1.9 oz  Weight (kg) 86.3 kg 85.866 kg 81.7 kg      Telemetry/ECG  Sinus bradycardia, HR 50-60s - Personally Reviewed  Physical Exam  GEN: No acute distress.   Neck: No JVD Cardiac: bradycardic, no murmurs, rubs, or gallops.  Respiratory: Clear to auscultation bilaterally. GI: Soft, nontender, non-distended  MS: No edema  Assessment & Plan  Recurrent paroxsymal atrial fibrillation Known history of A. Fib s/p DCCV  Presented with recurrent A. Fib and worsening dyspnea  Converted back to NSR with diuresis then had recurrence Started on IV amiodarone and converted back Transitioned to PO amiodarone, then had recurrence of A. Fib with RVR Re-bolused with IV amiodarone, converted back to NSR 4:30 AM 6/11 converted back to A. Fib with RVR, HR 150s  Patient was then started back on higher dose amio with Lopressor  -- became hypotensive with concerning symptoms CT head was negative, lactate was normal  Given  250 cc IVF bolus  BP normalized and stable, currently 133/52 with HR 50s Rechecked TSH, normal  Continue Eliquis  5 mg BID  Currently on IV amiodarone 60mg /hr -- continue on IV for today, ensure patient is stable and can transition to PO amiodarone possible tomorrow    Acute HFrEF Hypoxic respiratory failure Presented with hypoxia and dyspnea Echo 05/2024 showed: EF 25-30%, global hypokinesis LHC 04/2024: non-obstructive CAD BNP 1,135 Started on IV Lasix , Entresto, spironolactone  Renal function declined with above medications Creatinine 1.58 > 2.00 > 2.89 > 2.59 > 2.54 Hold Lasix , Entresto, spironolactone  Further workup and treatment per primary    Non-obstructive CAD  Elevated troponin levels  LHC 04/2024: non-obstructive CAD Troponin 211 > 272, likely demand ischemia with CHF exacerbation/A. Fib RVR  Not currently on ASA with Eliquis  use Continue Crestor     For questions or updates, please contact [facility] Please consult [email or web address] for contact info under       Signed, Jiles Mote, PA-C    Patient seen and examined with TP PA-C.  Agree as above, with the following exceptions and changes as noted below. Pt is feeling better and now in SR with bradycardia. Gen: NAD, CV: RRR, no murmurs, Lungs: clear, Abd: soft, Extrem: Warm, well perfused, no edema, Neuro/Psych: alert and  oriented x 3, normal mood and affect. All available labs, radiology testing, previous records reviewed. BP stable today. Hold additional fluids.  Does not appear she will tolerate GDMT given hypotension and renal function, may have to consider again as outpatient. Lactate and head imaging normal. We may need to pursue RHC given her labile hemodynamics, lactate normal yesterday.  - pending clinical course will consider RHC as outpatient, may benefit from HF follow up.  -if recurrent AF in hospital, would engage EP consult team.  - continue IV amiodarone today and consider transition to oral  tomorrow.  Prisilla Kocsis A Kaceton Vieau, MD 06/08/24 11:15 AM

## 2024-06-09 DIAGNOSIS — I10 Essential (primary) hypertension: Secondary | ICD-10-CM | POA: Diagnosis not present

## 2024-06-09 DIAGNOSIS — I2510 Atherosclerotic heart disease of native coronary artery without angina pectoris: Secondary | ICD-10-CM | POA: Diagnosis not present

## 2024-06-09 DIAGNOSIS — I4891 Unspecified atrial fibrillation: Secondary | ICD-10-CM | POA: Diagnosis not present

## 2024-06-09 DIAGNOSIS — E785 Hyperlipidemia, unspecified: Secondary | ICD-10-CM

## 2024-06-09 DIAGNOSIS — I5023 Acute on chronic systolic (congestive) heart failure: Secondary | ICD-10-CM | POA: Diagnosis not present

## 2024-06-09 DIAGNOSIS — E1169 Type 2 diabetes mellitus with other specified complication: Secondary | ICD-10-CM

## 2024-06-09 LAB — BASIC METABOLIC PANEL WITH GFR
Anion gap: 9 (ref 5–15)
BUN: 42 mg/dL — ABNORMAL HIGH (ref 8–23)
CO2: 24 mmol/L (ref 22–32)
Calcium: 8.8 mg/dL — ABNORMAL LOW (ref 8.9–10.3)
Chloride: 97 mmol/L — ABNORMAL LOW (ref 98–111)
Creatinine, Ser: 2.03 mg/dL — ABNORMAL HIGH (ref 0.44–1.00)
GFR, Estimated: 25 mL/min — ABNORMAL LOW (ref >60)
Glucose, Bld: 162 mg/dL — ABNORMAL HIGH (ref 70–99)
Potassium: 3.7 mmol/L (ref 3.5–5.1)
Sodium: 130 mmol/L — ABNORMAL LOW (ref 135–145)

## 2024-06-09 LAB — GLUCOSE, CAPILLARY
Glucose-Capillary: 179 mg/dL — ABNORMAL HIGH (ref 70–99)
Glucose-Capillary: 182 mg/dL — ABNORMAL HIGH (ref 70–99)
Glucose-Capillary: 109 mg/dL — ABNORMAL HIGH (ref 70–99)
Glucose-Capillary: 157 mg/dL — ABNORMAL HIGH (ref 70–99)

## 2024-06-09 LAB — MAGNESIUM: Magnesium: 2.2 mg/dL (ref 1.7–2.4)

## 2024-06-09 MED ORDER — AMIODARONE HCL 200 MG PO TABS
200.0000 mg | ORAL_TABLET | Freq: Two times a day (BID) | ORAL | Status: DC
  Administered 2024-06-09 – 2024-06-10 (×3): 200 mg via ORAL
  Filled 2024-06-09 (×3): qty 1

## 2024-06-09 MED ORDER — POTASSIUM CHLORIDE CRYS ER 20 MEQ PO TBCR
40.0000 meq | EXTENDED_RELEASE_TABLET | Freq: Once | ORAL | Status: AC
  Administered 2024-06-09: 40 meq via ORAL
  Filled 2024-06-09: qty 2

## 2024-06-09 MED ORDER — AMIODARONE HCL 200 MG PO TABS: 200.0000 mg | ORAL_TABLET | Freq: Every day | ORAL | Status: DC

## 2024-06-09 NOTE — Progress Notes (Addendum)
 Progress Note   Patient: Destiny Brown ZOX:[id number] DOB: 1946-11-15 DOA: 06/02/2024     7 DOS: the patient was seen and examined on 06/09/2024   Brief hospital course: Destiny Brown was admitted to the hospital with the working diagnosis of heart failure decompensation.   78/F w paroxysmal A-fib on Eliquis , CAD status  PCI to LCx 03/2023, LBBB, type 2 diabetes, hypertension, obesity, RA not on DMARD, CKD stage IIIa, hypothyroidism, OSA not on CPAP.  Recently admitted 5/13-5/16 for A-fib with RVR and wide-complex tachycardia status post DCCV x 1 with return of NSR.  LHC >showed widely patent left circumflex stent with stable moderate proximal LAD stenosis and no evidence of obstructive disease.  Patient was discharged on metoprolol  25 mg twice daily.  patient's other antihypertensive medications including amlodipine , Lasix , hydrochlorothiazide , and losartan  were discontinued. - Had an outpatient echo which noted EF of 25-30%, grade 2 DD, mild MR -Presented to the ED 6/6 with worsening dyspnea, some edema -In the ED tachypneic tachycardic, hypoxic, creatinine 1.2, troponin 79, 221, BNP 1135, chest x-ray with cardiomegaly pulmonary vascular congestion edema and effusions.  - Admitted, started on diuretics, IV amiodarone  -6/8: Started on Entresto  -6/9 onwards, worsening AKI, Entresto  and diuretics held - 6/9 converted to sinus rhythm, worsening AKI 06/12 patient converted to sinus rhythm.  06/13 transitioned to po amiodarone    Assessment and Plan: * Acute on chronic systolic CHF (congestive heart failure) (HCC) Echocardiogram with reduced LV systolic function with EF 25 to 30%, global hypokinesis, mild LVH, RV systolic function with mild reduction, RVSP 36.9 mmHg, LA with moderate dilatation, no significant valvular disease.   Systolic blood pressure 140 mmHg range.   Continue atrial fibrillation rate control with amiodarone .  Possible addition of guideline directed medical therapy with SGLT  2 inh.   Atrial fibrillation (HCC) Patient converted to sinus rhythm, rate 50 bpm Transition to po amiodarone ,  Anticoagulation with apixaban .  Continue telemetry   CAD (coronary artery disease) No chest pain.  No acute coronary syndrome.   Continue statin for dyslipidemia.   Hypertension Continue blood pressure monitoring, blood pressure has improved.  Patient will benefit from RAAS inhibition   Chronic kidney disease, stage 4 (severe) (HCC) AKI Hyponatremia.   Renal function today with serum cr at 2,0 with K at 3,7 and serum bicarbonate at 24  Na 130 and Mg 2.2   Ads 40 meq Kcl  Follow up renal function in am.  May benefit from SGLT 2 inh  Hypothyroidism Continue levothyroxine .   Type 2 diabetes mellitus with hyperlipidemia (HCC) Continue glucose cover and monitoring with insulin  sliding scale Fasting glucose is 162 mg/dl   Continue statin therapy   Obesity, class 1 Calculated BMI is 31.6      Subjective: Patient is feeling better, no chest pain and no dyspnea, no PND or orthopnea   Physical Exam: Vitals:   06/08/24 2339 06/09/24 0436 06/09/24 0649 06/09/24 0739  BP: (!) 142/54 (!) 156/65  (!) 141/60  Pulse:  (!) 52  (!) 48  Resp: 16 16  18   Temp: 97.8 F (36.6 C) 97.8 F (36.6 C)  98.5 F (36.9 C)  TempSrc: Oral Oral  Oral  SpO2:  100%  98%  Weight:   86.1 kg   Height:       Neurology awake and alert ENT with mild pallor with no icterus Cardiovascular with S1 and S2 present and regular with no gallops, rubs or murmurs No JVD No lower extremity edema Respiratory with  no wheezing, rales or rhonchi  Abdomen with no distention.  No rashes.   Data Reviewed:    Family Communication: no family at the bedside   Disposition: Status is: Inpatient Remains inpatient appropriate because: transition to po amiodarone    Planned Discharge Destination: Home     Author: Albertus Alt, MD 06/09/2024 9:53 AM  For on call review  [email or web address].

## 2024-06-09 NOTE — Progress Notes (Signed)
 Physical Therapy Treatment Patient Details Name: Destiny Brown MRN: [id number] DOB: [birth year] Today's Date: 06/09/2024   History of Present Illness Pt is a 78 y.o female readmitted 6/6 with SOB after recent admission 5/13-16 for afib with RVR.  PMHx:   paroxysmal A-fib on Eliquis , CAD status post PCI to LCx 03/2023, LBBB, type 2 diabetes, hypertension, obesity, RA not on DMARD, CKD stage IIIa, hypothyroidism, OSA not on CPAP    PT Comments  Pt continues to make progress towards her goals. HR low but stable today. 46 bpm at rest, mid 60s with ambulation and 56bpm with laying in bed. Pt is independent with bed mobility, supervision for transfers and contact guard for ambulation in hallway. D/c plans remain appropriate. PT will continue to follow acutely.     If plan is discharge home, recommend the following: A little help with walking and/or transfers;A little help with bathing/dressing/bathroom;Assistance with cooking/housework;Assist for transportation   Can travel by private vehicle      Yes  Equipment Recommendations  None recommended by PT       Precautions / Restrictions Precautions Precautions:  (RVR) Recall of Precautions/Restrictions: Intact Restrictions Weight Bearing Restrictions Per Provider Order: No     Mobility  Bed Mobility Overal bed mobility: Needs Assistance Bed Mobility: Sit to Supine       Sit to supine: Independent   General bed mobility comments: independent with returning to a flattened bed    Transfers Overall transfer level: Needs assistance Equipment used: Rolling walker (2 wheels) Transfers: Sit to/from Stand, Bed to chair/wheelchair/BSC Sit to Stand: Supervision           General transfer comment: supervision for safety, good hand placement for power up and able to self steady in RW    Ambulation/Gait Ambulation/Gait assistance: Supervision Gait Distance (Feet): 300 Feet Assistive device: Rolling walker (2 wheels) Gait  Pattern/deviations: Step-through pattern, Shuffle, Decreased step length - right, Decreased step length - left Gait velocity: slowed Gait velocity interpretation: <1.8 ft/sec, indicate of risk for recurrent falls   General Gait Details: contact guard for safety with slowed shuffling gait vc for proximity to RW          Balance Overall balance assessment: Needs assistance Sitting-balance support: No upper extremity supported, Feet supported Sitting balance-Leahy Scale: Good     Standing balance support: Bilateral upper extremity supported, Single extremity supported, During functional activity Standing balance-Leahy Scale: Fair Standing balance comment: requires UE support for dynamic activities                            Communication Communication Communication: No apparent difficulties;Impaired  Cognition Arousal: Alert Behavior During Therapy: WFL for tasks assessed/performed   PT - Cognitive impairments: No apparent impairments                         Following commands: Intact      Cueing Cueing Techniques: Verbal cues     General Comments General comments (skin integrity, edema, etc.): HR 46 in seated, rose to mid 60s with ambulation and returned to 56bpm with return to supine      Pertinent Vitals/Pain Pain Assessment Pain Assessment: No/denies pain     PT Goals (current goals can now be found in the care plan section) Acute Rehab PT Goals PT Goal Formulation: With patient Time For Goal Achievement: 06/19/24 Potential to Achieve Goals: Good Progress towards PT goals: Progressing toward goals  Frequency    Min 3X/week       AM-PAC PT 6 Clicks Mobility   Outcome Measure  Help needed turning from your back to your side while in a flat bed without using bedrails?: A Little Help needed moving from lying on your back to sitting on the side of a flat bed without using bedrails?: A Little Help needed moving to and from a bed to  a chair (including a wheelchair)?: A Little Help needed standing up from a chair using your arms (e.g., wheelchair or bedside chair)?: A Little Help needed to walk in hospital room?: A Little Help needed climbing 3-5 steps with a railing? : A Little 6 Click Score: 18    End of Session   Activity Tolerance: Patient tolerated treatment well Patient left: with call bell/phone within reach;with family/visitor present;in chair Nurse Communication: Mobility status PT Visit Diagnosis: Other abnormalities of gait and mobility (R26.89)     Time: 1610-9604 PT Time Calculation (min) (ACUTE ONLY): 17 min  Charges:    $Gait Training: 8-22 mins PT General Charges $$ ACUTE PT VISIT: 1 Visit                     Cobain Morici B. Jewel Mortimer PT, DPT Acute Rehabilitation Services Please use secure chat or  Call Office [phone]    Destiny Brown [facility] 06/09/2024, 12:09 PM

## 2024-06-09 NOTE — Progress Notes (Signed)
   Heart Failure Stewardship Pharmacist Progress Note   PCP: Rosslyn Coons, MD PCP-Cardiologist: Richardo Chandler, MD    HPI:  5 YOF with PMH of paroxysmal Afib on Eliquis , CAD s/p PCI to LCX 03/2023, LBBB, T2DM, HTN, obesity, RA, CKD, hypothyroidism, OSA.  Patient came to the ED following PCP appointment where she endorsed SOB, and underwent ECHO with new found LVEF 25-30%.  Outpatient ECHO showed LVEF 25-30%, LV severely decreased function, global hypokinesis, mild LV hypertrophy, grade II diastolic dysfunction, and L wave mitral inflow pattern. RV function mildly reduced, Mild calcification of aortic valve without evidence of aortic stenosis. L heart cath 05/11/2024 showed widely patent LCX with moderate proximal LAD stenosis without evidence of obstructive disease.  Previous ECHO 02/24/2023 showed LVEF 55-60% with normal LV function, mild hypertrophy, grade I diastolic dysfunction. Has been in and out of afib RVR this admission. Underwent chemical cardioversion with amiodarone  6/9 and continued with IV amiodarone  through 6/10. Patient went back into Afib early 6/11. Remains on IV amiodarone  with rate increase. Back into sinus rhythm 6/11 early afternoon. Transitioning to PO amiodarone  today. Continue to watch HR.  On exam, no lower extremity edema present. Patient denies SOB, chest pain / tightness. States she is continuing to feel better.   Current HF Medications: None   Prior to admission HF Medications: Diuretic: furosemide  20 mg PO daily Beta blocker: metoprolol  tartrate 25 mg BID ACE/ARB/ARNI: losartan  100 mg daily Other: amlodipine  5 mg daily Other: hydrochlorothiazide  25 mg daily   Pertinent Lab Values: Serum creatinine 2.89>2.59>2.54> 2.03, BUN 59>57>42, Potassium 3.7, Sodium 130, BNP 1,135.6, Magnesium  2.2, A1c 6.6  Vital Signs: Weight: 189 lbs (admission weight: 193 lbs) Blood pressure: 130-150/60s Heart rate: 40-50's I/O: net +0.4L yesterday; net -0.2L since admission -  incomplete  Medication Assistance / Insurance Benefits Check: Does the patient have prescription insurance?  Yes Type of insurance plan: [employer]  Does the patient qualify for medication assistance through manufacturers or grants?   Pending  Outpatient Pharmacy:  Prior to admission outpatient pharmacy: [facility] [address]  Is the patient willing to use [facility] TOC pharmacy at discharge? Yes Is the patient willing to transition their outpatient pharmacy to utilize a [facility] outpatient pharmacy?   No  Assessment: 1. Acute on chronic systolic CHF (LVEF 25-30%), presumed secondary to Afib RVR. NYHA class II symptoms. - Continue to hold metoprolol  with low HR - Continue to hold Entresto  in setting of AKI - Continue to hold spironolactone  in setting of AKI - Daily Weight. Strict I/O's - Keep K > 4, Mg > 2   Plan: 1) Medication changes recommended at this time: - Will continue to monitor HR after transition to PO amiodarone  and assess need for metoprolol . - If BP can tolerate, consider spironolactone  12.5 mg daily as Scr improves  2) Patient assistance: - Ineligible for co-pay cards - Entresto  co-pay $47  3)  Education  - Initial education provided - To be completed prior to discharge  [name], PharmD Candidate [address] [employer] 06/09/2024 1:08 PM

## 2024-06-09 NOTE — Progress Notes (Addendum)
 Progress Note  Patient Name: Destiny Brown Date of Encounter: 06/09/2024 [facility] Cardiologist: Richardo Chandler, MD   Interval Summary   Patient reports feeling good, has been ambulating without issue No rapid rates in the last 24 hours Transitioning from IV to PO amiodarone  today  Renal function continuing to improve   Vital Signs Vitals:   06/08/24 2339 06/09/24 0436 06/09/24 0649 06/09/24 0739  BP: (!) 142/54 (!) 156/65  (!) 141/60  Pulse:  (!) 52  (!) 48  Resp: 16 16  18   Temp: 97.8 F (36.6 C) 97.8 F (36.6 C)  98.5 F (36.9 C)  TempSrc: Oral Oral  Oral  SpO2:  100%  98%  Weight:   86.1 kg   Height:        Intake/Output Summary (Last 24 hours) at 06/09/2024 0859 Last data filed at 06/09/2024 0430 Gross per 24 hour  Intake 240 ml  Output 0 ml  Net 240 ml      06/09/2024    6:49 AM 06/08/2024    5:00 AM 06/07/2024    5:19 AM  Last 3 Weights  Weight (lbs) 189 lb 13.1 oz 190 lb 4.1 oz 189 lb 4.8 oz  Weight (kg) 86.1 kg 86.3 kg 85.866 kg     Telemetry/ECG  Sinus bradycarida, HR 40-50s - Personally Reviewed  Physical Exam  GEN: No acute distress.   Neck: No JVD Cardiac: bradycardic, no murmurs, rubs, or gallops.  Respiratory: Clear to auscultation bilaterally. GI: Soft, nontender, non-distended  MS: No edema  Assessment & Plan  Recurrent paroxsymal atrial fibrillation Known history of A. Fib s/p DCCV  Presented with recurrent A. Fib and worsening dyspnea  Converted back to NSR with diuresis then had recurrence Started on IV amiodarone  and converted back Transitioned to PO amiodarone , then had recurrence of A. Fib with RVR Re-bolused with IV amiodarone , converted back to NSR 4:30 AM 6/11 converted back to A. Fib with RVR, HR 150s  Patient was then started back on higher dose amio with Lopressor  -- became hypotensive with concerning symptoms CT head was negative, lactate was normal  Given 250 cc IVF bolus  BP normalized and stable, currently  141/60 with HR 40-50s Rechecked TSH, normal  No recent rapid rates on telemetry Continue Eliquis  5 mg BID  Currently on IV amiodarone  30 mg/hr, transition to PO amiodarone  today at 200 mg BID x 1 week then daily    Acute HFrEF Hypoxic respiratory failure Presented with hypoxia and dyspnea Echo 05/2024 showed: EF 25-30%, global hypokinesis LHC 04/2024: non-obstructive CAD BNP 1,135 Started on IV Lasix , Entresto , spironolactone   Renal function declined with above medications Creatinine 1.58 > 2.00 > 2.89 > 2.59 > 2.54 > 2.03 Hold Lasix , Entresto , spironolactone  -- can add back with resolution of AKI   Non-obstructive CAD  Elevated troponin levels  LHC 04/2024: non-obstructive CAD Troponin 211 > 272, likely demand ischemia with CHF exacerbation/A. Fib RVR  Not currently on ASA with Eliquis  use Continue Crestor    Follow up already scheduled with gen cards, EP and TOC with AHF   For questions or updates, please contact [facility] Please consult [email or web address] for contact info under       Signed, Jiles Mote, PA-C   Patient seen and examined with Laquita Plant PA-C.  Agree as above, with the following exceptions and changes as noted below. Pt feels well, seen with HF PharmD present, we discussed medical therapy in multidisciplinary fashion. Gen: NAD, CV: RRR, no  murmurs, Lungs: clear, Abd: soft, Extrem: Warm, well perfused, no edema, Neuro/Psych: alert and oriented x 3, normal mood and affect. All available labs, radiology testing, previous records reviewed. Maintaining SR, now off of IV amiodarone  and on oral amidarone. D/w HF PharmD, appears relatively euvolemic on exam. Will consider adding low dose spironolactone  tomorrow AM if BP stable to monitor for hypotension. If well tolerated, can likely dc home tomorrow with close follow up.  Newell Frater A Monnie Gudgel, MD 06/09/24 10:16 AM

## 2024-06-09 NOTE — Progress Notes (Signed)
 Mobility Specialist Progress Note;   06/09/24 0920  Mobility  Activity Transferred from bed to chair;Ambulated with assistance in room  Level of Assistance Standby assist, set-up cues, supervision of patient - no hands on  Assistive Device None  Distance Ambulated (ft) 8 ft  Activity Response Tolerated well  Mobility Referral Yes  Mobility visit 1 Mobility  Mobility Specialist Start Time (ACUTE ONLY) 0920  Mobility Specialist Stop Time (ACUTE ONLY) 0932  Mobility Specialist Time Calculation (min) (ACUTE ONLY) 12 min   Pt eager for mobility. Requested to transfer to chair today w/o physical assistance to measure what she is able to do herself. Required SV for all mobility for safety. HR maintained during activity. No c/o when asked. Pt left comfortably in chair with all needs met, call bell in reach.   Destiny Brown Mobility Specialist Please contact via SecureChat or [employer] [phone]

## 2024-06-09 NOTE — Plan of Care (Signed)
  Problem: Education: Goal: Ability to describe self-care measures that may prevent or decrease complications (Diabetes Survival Skills Education) will improve Outcome: Progressing   Problem: Health Behavior/Discharge Planning: Goal: Ability to identify and utilize available resources and services will improve Outcome: Progressing   Problem: Nutritional: Goal: Maintenance of adequate nutrition will improve Outcome: Progressing   Problem: Skin Integrity: Goal: Risk for impaired skin integrity will decrease Outcome: Progressing   Problem: Tissue Perfusion: Goal: Adequacy of tissue perfusion will improve Outcome: Progressing

## 2024-06-09 NOTE — Plan of Care (Signed)
  Problem: Education: Goal: Ability to demonstrate management of disease process will improve Outcome: Progressing Goal: Ability to verbalize understanding of medication therapies will improve Outcome: Progressing Goal: Individualized Educ Problem: Activity: Goal: Ability to tolerate increased activity will improve 06/09/2024 2222 by Ector Goltz, RN Outcome: Progressing    Problem: Cardiac: Goal: Ability to achieve and maintain adequate cardiopulmonary perfusion will improve 06/09/2024 2222 by Ector Goltz, RN Outcome: Progressing 06/09/2024 2222 by Ector Goltz, RN Outcome: Progressing Problem: Activity: Goal: Capacity to carry out activities will improve Outcome: Progressing Problem: Cardiac: Goal: Ability to achieve and maintain adequate cardiopulmonary perfusion will improve Outcome: Progressing Problem: Education: Goal: Knowledge of disease or condition will improve 06/09/2024 2222 by Ector Goltz, RN Outcome: Progressing Goal: Understanding of medication regimen will improve 06/09/2024 2222 by Ector Goltz, RN Outcome: Progressing Goal: Individualized Educational Video(s) 06/09/2024 2222 by Ector Goltz, RN Outcome: Progressing

## 2024-06-10 ENCOUNTER — Other Ambulatory Visit (HOSPITAL_COMMUNITY): Payer: Self-pay

## 2024-06-10 DIAGNOSIS — I2510 Atherosclerotic heart disease of native coronary artery without angina pectoris: Secondary | ICD-10-CM | POA: Diagnosis not present

## 2024-06-10 DIAGNOSIS — I5023 Acute on chronic systolic (congestive) heart failure: Secondary | ICD-10-CM | POA: Diagnosis not present

## 2024-06-10 DIAGNOSIS — I10 Essential (primary) hypertension: Secondary | ICD-10-CM | POA: Diagnosis not present

## 2024-06-10 DIAGNOSIS — I4891 Unspecified atrial fibrillation: Secondary | ICD-10-CM | POA: Diagnosis not present

## 2024-06-10 LAB — GLUCOSE, CAPILLARY
Glucose-Capillary: 176 mg/dL — ABNORMAL HIGH (ref 70–99)
Glucose-Capillary: 140 mg/dL — ABNORMAL HIGH (ref 70–99)

## 2024-06-10 LAB — RENAL FUNCTION PANEL
Albumin: 3.4 g/dL — ABNORMAL LOW (ref 3.5–5.0)
Anion gap: 11 (ref 5–15)
BUN: 33 mg/dL — ABNORMAL HIGH (ref 8–23)
CO2: 22 mmol/L (ref 22–32)
Calcium: 9.7 mg/dL (ref 8.9–10.3)
Chloride: 102 mmol/L (ref 98–111)
Creatinine, Ser: 2.06 mg/dL — ABNORMAL HIGH (ref 0.44–1.00)
GFR, Estimated: 24 mL/min — ABNORMAL LOW (ref >60)
Glucose, Bld: 145 mg/dL — ABNORMAL HIGH (ref 70–99)
Phosphorus: 3 mg/dL (ref 2.5–4.6)
Potassium: 4.5 mmol/L (ref 3.5–5.1)
Sodium: 135 mmol/L (ref 135–145)

## 2024-06-10 MED ORDER — FUROSEMIDE 40 MG PO TABS: 40.0000 mg | ORAL_TABLET | Freq: Every day | ORAL | Status: DC | PRN

## 2024-06-10 MED ORDER — AMIODARONE HCL 200 MG PO TABS
ORAL_TABLET | ORAL | 0 refills | Status: DC
  Filled 2024-06-10: qty 42, 36d supply, fill #0

## 2024-06-10 MED ORDER — PREGABALIN 50 MG PO CAPS
50.0000 mg | ORAL_CAPSULE | Freq: Every day | ORAL | 0 refills | Status: AC
  Filled 2024-06-10: qty 30, 30d supply, fill #0

## 2024-06-10 MED ORDER — AMLODIPINE BESYLATE 2.5 MG PO TABS
2.5000 mg | ORAL_TABLET | Freq: Every day | ORAL | Status: DC
  Administered 2024-06-10: 2.5 mg via ORAL
  Filled 2024-06-10: qty 1

## 2024-06-10 MED ORDER — FUROSEMIDE 40 MG PO TABS
40.0000 mg | ORAL_TABLET | Freq: Every day | ORAL | 0 refills | Status: DC | PRN
  Filled 2024-06-10: qty 30, 30d supply, fill #0

## 2024-06-10 MED ORDER — AMLODIPINE BESYLATE 5 MG PO TABS: 5.0000 mg | ORAL_TABLET | Freq: Every day | ORAL | Status: DC

## 2024-06-10 MED ORDER — AMLODIPINE BESYLATE 5 MG PO TABS
5.0000 mg | ORAL_TABLET | Freq: Every day | ORAL | 0 refills | Status: DC
  Filled 2024-06-10: qty 30, 30d supply, fill #0

## 2024-06-10 MED ORDER — METFORMIN HCL 1000 MG PO TABS: 1000.0000 mg | ORAL_TABLET | Freq: Every day | ORAL | Status: DC

## 2024-06-10 MED ORDER — AMLODIPINE BESYLATE 2.5 MG PO TABS
2.5000 mg | ORAL_TABLET | Freq: Every day | ORAL | 0 refills | Status: DC
  Filled 2024-06-10: qty 30, 30d supply, fill #0

## 2024-06-10 NOTE — Progress Notes (Signed)
 Mobility Specialist Progress Note;   06/10/24 1026  Mobility  Activity Ambulated with assistance in hallway  Level of Assistance Standby assist, set-up cues, supervision of patient - no hands on  Assistive Device Front wheel walker  Distance Ambulated (ft) 250 ft  Activity Response Tolerated well  Mobility Referral Yes  Mobility visit 1 Mobility  Mobility Specialist Start Time (ACUTE ONLY) 1026  Mobility Specialist Stop Time (ACUTE ONLY) 1034  Mobility Specialist Time Calculation (min) (ACUTE ONLY) 8 min   Pt eager for mobility. Required no physical assistance during ambulation, SV. HR in upper 50s-60s during activity. No c/o when asked. Pt returned back to chair with all needs met, call bell in reach. Husband in room.   Janit Meline Mobility Specialist Please contact via SecureChat or [employer] [phone]

## 2024-06-10 NOTE — Progress Notes (Signed)
 DISCHARGE NOTE HOME Destiny Brown to be discharged Home per MD order. Discussed prescriptions and follow up appointments with the patient. Prescriptions given to patient; medication list explained in detail. Patient verbalized understanding.  Skin clean, dry and intact without evidence of skin break down, no evidence of skin tears noted. IV catheter discontinued intact. Site without signs and symptoms of complications. Dressing and pressure applied. Pt denies pain at the site currently. No complaints noted.  See LDA for dermititis issues  Patient free of lines, drains, and wounds.   An After Visit Summary (AVS) was printed and given to the patient. Patient escorted via wheelchair, and discharged home via private auto.  Tonda Francisco, RN

## 2024-06-10 NOTE — Progress Notes (Signed)
 Progress Note  Patient Name: Destiny Brown Date of Encounter: 06/10/2024 [facility] Cardiologist: Richardo Chandler, MD   Interval Summary   Patient reports feeling good, has been ambulating without issue No rapid rates in the last 24 hours Renal function stable.   Vital Signs Vitals:   06/09/24 2320 06/10/24 0400 06/10/24 0815 06/10/24 1137  BP: (!) 156/67 (!) 174/57 137/68 (!) 148/61  Pulse: (!) 52 (!) 54 (!) 55 (!) 47  Resp: 16 18 19 18   Temp: 97.8 F (36.6 C) 97.7 F (36.5 C) 98.2 F (36.8 C) 97.7 F (36.5 C)  TempSrc: Oral Oral Oral Oral  SpO2: 99% 100% 96% 100%  Weight:  85.8 kg    Height:        Intake/Output Summary (Last 24 hours) at 06/10/2024 1150 Last data filed at 06/10/2024 0815 Gross per 24 hour  Intake 1080 ml  Output --  Net 1080 ml      06/10/2024    4:00 AM 06/09/2024    6:49 AM 06/08/2024    5:00 AM  Last 3 Weights  Weight (lbs) 189 lb 2.5 oz 189 lb 13.1 oz 190 lb 4.1 oz  Weight (kg) 85.8 kg 86.1 kg 86.3 kg     Telemetry/ECG  Sinus bradycarida, HR 40-50s - Personally Reviewed  Physical Exam  GEN: No acute distress.   Neck: No JVD Cardiac: bradycardic, no murmurs, rubs, or gallops.  Respiratory: Clear to auscultation bilaterally. GI: Soft, nontender, non-distended  MS: No edema  Assessment & Plan  Recurrent paroxsymal atrial fibrillation Known history of A. Fib s/p DCCV  Presented with recurrent A. Fib and worsening dyspnea  Converted back to NSR with diuresis then had recurrence Started on IV amiodarone  and converted back Transitioned to PO amiodarone , then had recurrence of A. Fib with RVR Re-bolused with IV amiodarone , converted back to NSR 4:30 AM 6/11 converted back to A. Fib with RVR, HR 150s  Patient was then started back on higher dose amio with Lopressor  -- became hypotensive with concerning symptoms CT head was negative, lactate was normal  Given 250 cc IVF bolus  BP normalized and stable, currently 148/61 with HR  40-50s Rechecked TSH, normal  No recent rapid rates on telemetry Continue Eliquis  5 mg BID   PO amiodarone  200 mg BID x 1 week then daily 200 mg.   Acute HFrEF Hypoxic respiratory failure Presented with hypoxia and dyspnea Echo 05/2024 showed: EF 25-30%, global hypokinesis LHC 04/2024: non-obstructive CAD BNP 1,135 Started on IV Lasix , Entresto , spironolactone   Renal function declined with above medications Creatinine 1.58 > 2.00 > 2.89 > 2.59 > 2.54 > 2.03>2.06 Hold Lasix , Entresto , spironolactone  -- can add back with resolution of AKI as outpatient.    HTN -was quite hypotensive while in afib rvr. Now Bps are elevated. Limited options with aki on CKD  - ok to use amlodipine  2.5 mg daily until follow up. Then consider transition to spironolactone  if Cr continues to improve. Did not restart spiro in hospital due to Cr plateau.   Non-obstructive CAD  Elevated troponin levels  LHC 04/2024: non-obstructive CAD Troponin 211 > 272, likely demand ischemia with CHF exacerbation/A. Fib RVR  Not currently on ASA with Eliquis  use Continue Crestor    Follow up already scheduled with gen cards, EP and TOC with AHF   For questions or updates, please contact [facility] Please consult [email or web address] for contact info under        Chanita Boden A Rever Pichette, MD 06/10/24 11:50  AM

## 2024-06-10 NOTE — Plan of Care (Signed)
  Problem: Pain Managment: Goal: General experience of comfort will improve and/or be controlled Outcome: Progressing   Problem: Safety: Goal: Ability to remain free from injury will improve Outcome: Progressing   Problem: Skin Integrity: Goal: Risk for impaired skin integrity will decrease Outcome: Progressing

## 2024-06-10 NOTE — Discharge Summary (Addendum)
 Physician Discharge Summary   Patient: Destiny Brown MRN: [id number] DOB: [birth year]  Admit date:     06/02/2024  Discharge date: 06/10/24  Discharge Physician: Curlee Doss Messina Kosinski   PCP: Rosslyn Coons, MD   Recommendations at discharge:    Patient will continue amiodarone  po load 200 mg bid for a total of 7 days and then continue with 200 mg daily. Discontinue metoprolol .  Guideline directed medical therapy for heart failure limited due to hypotension anda acutely reduced GFR. Consider RAAS inhibition initiation as outpatient.  Added as needed furosemide  for signs of volume overload, weight gain 2 to 3 lbs in 24 hrs or 5 lbs in 7 days Resumed low dose amlodipine  for blood pressure control. Decreased dose of duloxetine due to reduced GFR.  Reduced dose of metformin  due to reduced GFR Follow up renal function and electrolytes as outpatient in 7 days Follow up with Dr Jesse Moritz in 7 to 10 days Follow up with Cardiology as scheduled.   Discharge Diagnoses: Principal Problem:   Acute on chronic systolic CHF (congestive heart failure) (HCC) Active Problems:   Atrial fibrillation (HCC)   CAD (coronary artery disease)   Hypertension   Chronic kidney disease, stage 4 (severe) (HCC)   Hypothyroidism   Type 2 diabetes mellitus with hyperlipidemia (HCC)   Obesity, class 1  Resolved Problems:   * No resolved hospital problems. [facility] Course: Destiny Brown was admitted to the hospital with the working diagnosis of heart failure decompensation.   78/F w paroxysmal A-fib , CAD status  PCI to LCx 03/2023, LBBB, type 2 diabetes, hypertension, obesity, RA, CKD stage IIIa, hypothyroidism, OSA not on CPAP.   Recently admitted 5/13-5/16 for A-fib with RVR and wide-complex tachycardia status post DCCV x 1 with return of NSR.  LHC >showed widely patent left circumflex stent with stable moderate proximal LAD stenosis and no evidence of obstructive disease.   Patient was discharged on metoprolol   25 mg twice daily.  patient's other antihypertensive medications including amlodipine , Lasix , hydrochlorothiazide , and losartan  were discontinued.  At home she developed dyspnea for a few days, that acutely worsened over the last 24 hrs prior to admission. Positive cough and lower extremity edema. EMS was called, she was found with wheezing with 02 saturation in the 80s on room air, then she was transported to the ED.  On her initial physical examination her blood pressure was 168/85 HR 78 RR 25 and 02 saturation 97% on supplemental 02 per [place], lungs with bilateral rales with no wheezing, heart with S1 and S2 present, irregularly irregular with no rubs, or gallops, abdomen with no distention and positive lower extremity edema.   Na 138, K 3,6 Cl 106 bicarbonate 19 glucose 271 bun 19 cr 1,29  Mg 1,6  AST 23 and ALT 16  BNP 1,135  High sensitive troponin 79, 211 and 292  Wbc 9,9 hgb 12.1 plt 202   Chest radiograph with cardiomegaly, bilateral hilar vascular congestion, bilateral cephalization of the vasculature, bilateral central dense interstitial and alveolar infiltrates predominant at the lower lobes with small bilateral pleural effusions.   EKG 99 bpm, left axis deviation, left bundle branch block, qtc 518, sinus rhythm with poor RR wave progression with no significant ST segment or T wave changes.   Started on IV diuretics. Developed RVR due to atrial fibrillation and was started on IV amiodarone  6/8: Started on Entresto  6/9 onwards, worsening AKI, Entresto  and diuretics held 6/9 converted transitory to sinus rhythm, worsening AKI Recurrent atrial  fibrillation.  06/12 patient converted to sinus rhythm on IV amiodarone .  06/13 transitioned to po amiodarone   06/14 improved volume status and blood pressure, continue sinus rhythm on telemetry monitoring.  Plan to start low dose amlodipine  for blood pressure control and have close follow up as outpatient.   Assessment and Plan: * Acute on  chronic systolic CHF (congestive heart failure) (HCC) Echocardiogram with reduced LV systolic function with EF 25 to 30%, global hypokinesis, mild LVH, RV systolic function with mild reduction, RVSP 36.9 mmHg, LA with moderate dilatation, no significant valvular disease.   Patient was placed on IV furosemide  for diuresis, negative fluid balance was achieved, with significant improvement in her symptoms.   Continue atrial fibrillation rate control with amiodarone .  Possible addition of guideline directed medical therapy as outpatient, due to recovering from hypotension and renal failure.   Atrial fibrillation (HCC) Patient converted to sinus rhythm, rate 50 bpm Transition to po amiodarone ,  Anticoagulation with apixaban .   CAD (coronary artery disease) No chest pain.  No acute coronary syndrome.   Continue statin for dyslipidemia.   Hypertension Continue blood pressure monitoring, blood pressure has improved.  Patient will benefit from RAAS inhibition, to start as outpatient. At the time of discharge will do low dose amlodipine    Chronic kidney disease, stage 4 (severe) (HCC) AKI Hyponatremia.   Renal function with serum cr at 2,0 with K at 4,5 and serum bicarbonate at 22  Na 135 and P 3,0   Plan to follow up renal function as outpatient.   Hypothyroidism Continue levothyroxine .   Type 2 diabetes mellitus with hyperlipidemia (HCC) Patient was placed on insulin  sliding scale for glucose cover and monitoring. Fasting glucose is 145 mg/dl  Resume metformin  at the time of discharge.   Continue statin therapy   Obesity, class 1 Calculated BMI is 31.6        Consultants: cardiology  Procedures performed: none   Disposition: Home Diet recommendation:  Cardiac and Carb modified diet DISCHARGE MEDICATION: Allergies as of 06/10/2024       Reactions   Oxycodone Other (See Comments)   Severe headaches, hallucinations    Codeine Other (See Comments)   Severe Headaches  with codeine; but not with other opioids        Medication List     STOP taking these medications    metoprolol  tartrate 25 MG tablet Commonly known as: LOPRESSOR        TAKE these medications    albuterol  108 (90 Base) MCG/ACT inhaler Commonly known as: VENTOLIN  HFA Inhale 2 puffs into the lungs every 6 (six) hours as needed for wheezing or shortness of breath (Cough).   amiodarone  200 MG tablet Commonly known as: PACERONE  Take 1 tablet (200 mg total) by mouth 2 (two) times daily for 6 days, THEN 1 tablet (200 mg total) daily. Start taking on: June 10, 2024   amLODipine  2.5 MG tablet Commonly known as: NORVASC  Take 1 tablet (2.5 mg total) by mouth daily.   apixaban  5 MG Tabs tablet Commonly known as: ELIQUIS  Take 1 tablet (5 mg total) by mouth 2 (two) times daily.   [employer] Vitamin B-12 500 MCG tablet Generic drug: cyanocobalamin Take 500 mcg by mouth daily.   furosemide  40 MG tablet Commonly known as: LASIX  Take 1 tablet (40 mg total) by mouth daily as needed for edema or fluid. As needed in case of weight gain 1 to 2 lbs in 24 hrs or 5 lbs in 7 days.  latanoprost  0.005 % ophthalmic solution Commonly known as: XALATAN  Place 1 drop into both eyes at bedtime.   levothyroxine  100 MCG tablet Commonly known as: SYNTHROID  Take 1 tablet (100 mcg total) by mouth daily at 6 (six) AM.   metFORMIN  1000 MG tablet Commonly known as: GLUCOPHAGE  Take 1 tablet (1,000 mg total) by mouth daily with breakfast. What changed: when to take this   pregabalin  50 MG capsule Commonly known as: LYRICA  Take 1 capsule (50 mg total) by mouth daily. Start taking on: June 11, 2024 What changed:  medication strength how much to take when to take this   rosuvastatin  5 MG tablet Commonly known as: CRESTOR  Take 5 mg by mouth 2 (two) times a week. Mondays & Thursdays.   Vitamin D3 25 MCG (1000 UT) Caps Take 1,000 Units by mouth in the morning.        Follow-up  Information     [facility]. Go in 19 day(s).   Specialty: Cardiology Why: Hospital follow up 06/26/2024 @ 10:15 am PLEASE bring a current medication list to appointment FREE valet parking, Entrance C, look for [facility] entrance, off [address] [employer] information: [address] [place]  [phone]               Discharge Exam: Cleavon Curls Weights   06/08/24 0500 06/09/24 0649 06/10/24 0400  Weight: 86.3 kg 86.1 kg 85.8 kg   BP 137/68   Pulse (!) 55   Temp 98.2 F (36.8 C) (Oral)   Resp 19   Ht 5' 5 (1.651 m)   Wt 85.8 kg   SpO2 96%   BMI 31.48 kg/m   Patient is feeling well, no chest pain, no palpitation, no dyspnea, no lower extremity edema or orthopnea.   Neurology awake and alert ENT with mild pallor with no icterus Cardiovascular with S1 and S2 present and regular with no gallops or rubs, no murmurs Respiratory with no wheezing or rhonchi, no rales Abdomen with no distention, soft and non tender No lower extremity edema   Condition at discharge: stable  The results of significant diagnostics from this hospitalization (including imaging, microbiology, ancillary and laboratory) are listed below for reference.   Imaging Studies: CT HEAD WO CONTRAST ( ) Result Date: 06/07/2024 CLINICAL DATA:  Mental status change, unknown cause EXAM: CT HEAD WITHOUT CONTRAST TECHNIQUE: Contiguous axial images were obtained from the base of the skull through the vertex without intravenous contrast. RADIATION DOSE REDUCTION: This exam was performed according to the departmental dose-optimization program which includes automated exposure control, adjustment of the mA and/or kV according to patient size and/or use of iterative reconstruction technique. COMPARISON:  MRI of the brain dated January 29, 2016. FINDINGS: Brain: Age-related atrophy and mild-to-moderate periventricular white  matter disease. No evidence of hemorrhage, mass, cortical infarct or hydrocephalus. Vascular: Mild atheromatous disease within the carotid siphons. Skull: Intact and unremarkable. Sinuses/Orbits: Status post bilateral lens replacement. Paranasal sinuses and mastoid air cells are clear. Other: None. IMPRESSION: Age-related cerebral volume loss and mild to moderate periventricular white matter disease. No apparent acute process. Electronically Signed   By: Maribeth Shivers M.D.   On: 06/07/2024 14:12   US  RENAL Result Date: 06/06/2024 CLINICAL DATA:  Acute kidney injury. EXAM: RENAL / URINARY TRACT ULTRASOUND COMPLETE COMPARISON:  January 20, 2024. FINDINGS: Right Kidney: Renal measurements: 9.1 x 5.3 x 5.2 cm = volume: 131 mL. Echogenicity within normal limits. No mass or hydronephrosis  visualized. Left Kidney: Renal measurements: 11.0 x 4.1 x 4.7 cm = volume: 110 mL. Echogenicity within normal limits. No mass or hydronephrosis visualized. Bladder: Appears normal for degree of bladder distention. Other: None. IMPRESSION: No definite renal abnormality seen. Electronically Signed   By: Rosalene Colon M.D.   On: 06/06/2024 09:51   DG Chest Port 1 View Result Date: 06/04/2024 CLINICAL DATA:  Shortness of breath. EXAM: PORTABLE CHEST 1 VIEW COMPARISON:  06/02/2024 FINDINGS: Stable cardiac enlargement. Aortic atherosclerosis. Interval improvement in bilateral pleural effusions and interstitial edema. Decreased atelectasis noted in the left base. No new findings. Dorsal column stimulator within the thoracic canal. Signs of previous ACDF within the cervical spine. Right shoulder arthroplasty. IMPRESSION: 1. Interval improvement in bilateral pleural effusions and interstitial edema. 2. Decreased left base atelectasis. Electronically Signed   By: Kimberley Penman M.D.   On: 06/04/2024 08:17   DG Chest Portable 1 View Result Date: 06/02/2024 CLINICAL DATA:  Shortness of breath. EXAM: PORTABLE CHEST 1 VIEW COMPARISON:   Chest radiograph dated 05/09/2024. FINDINGS: There is mild cardiomegaly with vascular congestion and edema. Bibasilar densities may represent atelectasis or pneumonia. Small bilateral pleural effusions suspected. No pneumothorax. Right shoulder arthroplasty. No acute osseous pathology. IMPRESSION: 1. Cardiomegaly with vascular congestion and edema. 2. Bibasilar atelectasis or pneumonia. Electronically Signed   By: Angus Bark M.D.   On: 06/02/2024 19:53   ECHOCARDIOGRAM COMPLETE Result Date: 06/02/2024    ECHOCARDIOGRAM REPORT   Patient Name:   DEETTE REVAK Date of Exam: 06/02/2024 Medical Rec #:  [id number]        Height:       65.0 in Accession #:    [id number]       Weight:       186.5 lb Date of Birth:  [birth year]        BSA:          1.920 m Patient Age:    78 years         BP:           186/109 mmHg Patient Gender: F                HR:           58 bpm. Exam Location:  [address] Procedure: 2D Echo, Cardiac Doppler and Color Doppler (Both Spectral and Color            Flow Doppler were utilized during procedure). Indications:    R06.00 Dyspnea  History:        Patient has prior history of Echocardiogram examinations, most                 recent 02/24/2023. CAD, s/p LCX DES, Arrythmias:LBBB,                 Signs/Symptoms:Shortness of Breath, Dyspnea and Edema; Risk                 Factors:Hypertension and Obesity. Previouse echo LVEF 60% mild                 LAE.  Sonographer:    Donnita Gales [facility], RDCS Referring Phys: [id number] RENEE LYNN URSUY  Sonographer Comments: Notified DOD Dr. Julane Ny IMPRESSIONS  1. There is an L wave in the mitral inflow pattern suggesting significant diastolic dysfunction and increased filling pressures. Left ventricular ejection fraction, by estimation, is 25 to 30%. The left ventricle has severely decreased function. The left ventricle demonstrates global hypokinesis. There is  mild concentric left ventricular hypertrophy. Left ventricular diastolic parameters are  consistent with Grade II diastolic dysfunction (pseudonormalization).  2. Right ventricular systolic function is mildly reduced. The right ventricular size is normal. There is mildly elevated pulmonary artery systolic pressure. The estimated right ventricular systolic pressure is 36.9 mmHg.  3. Left atrial size was moderately dilated.  4. The mitral valve is normal in structure. Mild mitral valve regurgitation. No evidence of mitral stenosis.  5. The aortic valve is tricuspid. There is mild calcification of the aortic valve. Aortic valve regurgitation is not visualized. Aortic valve sclerosis/calcification is present, without any evidence of aortic stenosis. Conclusion(s)/Recommendation(s): Severe global LV dysfunction new since previous. Doppler parameters suggestive of high left-side filling pressures. FINDINGS  Left Ventricle: There is an L wave in the mitral inflow pattern suggesting significant diastolic dysfunction and increased filling pressures. Left ventricular ejection fraction, by estimation, is 25 to 30%. The left ventricle has severely decreased function. The left ventricle demonstrates global hypokinesis. The left ventricular internal cavity size was normal in size. There is mild concentric left ventricular hypertrophy. Left ventricular diastolic parameters are consistent with Grade II diastolic dysfunction (pseudonormalization). Right Ventricle: The right ventricular size is normal. No increase in right ventricular wall thickness. Right ventricular systolic function is mildly reduced. There is mildly elevated pulmonary artery systolic pressure. The tricuspid regurgitant velocity  is 2.69 m/s, and with an assumed right atrial pressure of 8 mmHg, the estimated right ventricular systolic pressure is 36.9 mmHg. Left Atrium: Left atrial size was moderately dilated. Right Atrium: Right atrial size was normal in size. Pericardium: There is no evidence of pericardial effusion. Mitral Valve: The mitral valve  is normal in structure. Mild mitral valve regurgitation. No evidence of mitral valve stenosis. Tricuspid Valve: The tricuspid valve is normal in structure. Tricuspid valve regurgitation is trivial. No evidence of tricuspid stenosis. Aortic Valve: The aortic valve is tricuspid. There is mild calcification of the aortic valve. Aortic valve regurgitation is not visualized. Aortic valve sclerosis/calcification is present, without any evidence of aortic stenosis. Pulmonic Valve: The pulmonic valve was normal in structure. Pulmonic valve regurgitation is not visualized. No evidence of pulmonic stenosis. Aorta: The aortic root is normal in size and structure. IAS/Shunts: No atrial level shunt detected by color flow Doppler.  LEFT VENTRICLE PLAX 2D LVIDd:         5.00 cm   Diastology LVIDs:         4.00 cm   LV e' medial:    3.92 cm/s LV PW:         0.80 cm   LV E/e' medial:  23.1 LV IVS:        1.20 cm   LV e' lateral:   4.57 cm/s LVOT diam:     2.00 cm   LV E/e' lateral: 19.8 LV SV:         63 LV SV Index:   33 LVOT Area:     3.14 cm  RIGHT VENTRICLE            IVC RV Basal diam:  3.20 cm    IVC diam: 2.50 cm RV Mid diam:    2.10 cm RV S prime:     9.14 cm/s TAPSE (M-mode): 2.1 cm RVSP:           36.9 mmHg LEFT ATRIUM             Index        RIGHT ATRIUM  Index LA diam:        4.90 cm 2.55 cm/m   RA Pressure: 8.00 mmHg LA Vol (A2C):   65.9 ml 34.32 ml/m  RA Area:     10.10 cm LA Vol (A4C):   35.6 ml 18.54 ml/m  RA Volume:   20.80 ml  10.83 ml/m LA Biplane Vol: 48.6 ml 25.31 ml/m  AORTIC VALVE LVOT Vmax:   79.70 cm/s LVOT Vmean:  51.900 cm/s LVOT VTI:    0.200 m  AORTA Ao Root diam: 2.70 cm Ao Asc diam:  2.70 cm MITRAL VALVE               TRICUSPID VALVE MV Area (PHT): 3.03 cm    TR Peak grad:   28.9 mmHg MV Decel Time: 250 msec    TR Vmax:        269.00 cm/s MV E velocity: 90.70 cm/s  Estimated RAP:  8.00 mmHg MV A velocity: 73.40 cm/s  RVSP:           36.9 mmHg MV E/A ratio:  1.24                             SHUNTS                            Systemic VTI:  0.20 m                            Systemic Diam: 2.00 cm Jules Oar MD Electronically signed by Jules Oar MD Signature Date/Time: 06/02/2024/10:09:39 AM    Final     Microbiology: Results for orders placed or performed during the hospital encounter of 02/17/22  Surgical PCR Screen     Status: None   Collection Time: 02/17/22  8:20 AM   Specimen: Nasal Mucosa; Nasal Swab  Result Value Ref Range Status   MRSA, PCR NEGATIVE NEGATIVE Final   Staphylococcus aureus NEGATIVE NEGATIVE Final    Comment: (NOTE) The Xpert SA Assay (FDA approved for NASAL specimens in patients 4 years of age and older), is one component of a comprehensive surveillance program. It is not intended to diagnose infection nor to guide or monitor treatment. Performed at [facility], [address] [place], [place] [address]     Labs: CBC: Recent Labs  Lab 06/04/24 0353 06/07/24 0343  WBC 13.3* 6.3  HGB 11.5* 11.8*  HCT 36.5 36.4  MCV 84.9 82.5  PLT 239 194   Basic Metabolic Panel: Recent Labs  Lab 06/06/24 0414 06/07/24 0343 06/07/24 1147 06/08/24 0433 06/09/24 0448 06/10/24 0358  NA 134* 131*  --  132* 130* 135  K 4.2 3.5  --  4.0 3.7 4.5  CL 94* 95*  --  96* 97* 102  CO2 30 24  --  23 24 22   GLUCOSE 185* 164*  --  150* 162* 145*  BUN 56* 59*  --  57* 42* 33*  CREATININE 2.89* 2.59*  --  2.54* 2.03* 2.06*  CALCIUM  9.1 8.7*  --  8.8* 8.8* 9.7  MG  --   --  2.0  --  2.2  --   PHOS  --   --   --   --   --  3.0   Liver Function Tests: Recent Labs  Lab 06/10/24 0358  ALBUMIN  3.4*   CBG: Recent Labs  Lab 06/09/24 0744 06/09/24 1145 06/09/24 1734 06/09/24 2115 06/10/24 0737  GLUCAP 179* 182* 109* 157* 176*    Discharge time spent: greater than 30 minutes.  Signed: Albertus Alt, MD [facility] 06/10/2024

## 2024-06-13 ENCOUNTER — Ambulatory Visit: Attending: Physician Assistant | Admitting: Physician Assistant

## 2024-06-13 ENCOUNTER — Encounter: Payer: Self-pay | Admitting: Physician Assistant

## 2024-06-13 VITALS — BP 154/56 | HR 52 | Ht 64.0 in | Wt 190.0 lb

## 2024-06-13 DIAGNOSIS — I2510 Atherosclerotic heart disease of native coronary artery without angina pectoris: Secondary | ICD-10-CM

## 2024-06-13 DIAGNOSIS — I4819 Other persistent atrial fibrillation: Secondary | ICD-10-CM

## 2024-06-13 DIAGNOSIS — I5020 Unspecified systolic (congestive) heart failure: Secondary | ICD-10-CM | POA: Diagnosis not present

## 2024-06-13 DIAGNOSIS — N1831 Chronic kidney disease, stage 3a: Secondary | ICD-10-CM

## 2024-06-13 DIAGNOSIS — E7800 Pure hypercholesterolemia, unspecified: Secondary | ICD-10-CM

## 2024-06-13 DIAGNOSIS — I10 Essential (primary) hypertension: Secondary | ICD-10-CM

## 2024-06-13 MED ORDER — AMIODARONE HCL 200 MG PO TABS: 200.0000 mg | ORAL_TABLET | Freq: Every day | ORAL | 3 refills | Status: DC

## 2024-06-13 MED ORDER — FUROSEMIDE 40 MG PO TABS: 40.0000 mg | ORAL_TABLET | Freq: Every day | ORAL | 5 refills | Status: DC | PRN

## 2024-06-13 MED ORDER — AMLODIPINE BESYLATE 5 MG PO TABS: 5.0000 mg | ORAL_TABLET | Freq: Every day | ORAL | 3 refills | Status: DC

## 2024-06-13 NOTE — Assessment & Plan Note (Signed)
 Blood pressure uncontrolled.   - Increase amlodipine  to 5 mg daily

## 2024-06-13 NOTE — Assessment & Plan Note (Signed)
 Status post drug-eluting stent (DES) to mid left circumflex artery (LCX) in April 2024. Recent cardiac catheterization in May 2025 showed patent LCX stent and mild to moderate non-obstructive disease elsewhere. No antiplatelet therapy needed as she is on Eliquis . She is not having symptoms of angina. Continue Crestor  5 mg twice weekly.

## 2024-06-13 NOTE — Progress Notes (Signed)
 OFFICE NOTE:    Date:  06/13/2024  ID:  Lael Pierce, DOB [birth year], MRN [id number] PCP: Rosslyn Coons, MD  [facility] Providers Cardiologist:  Richardo Chandler, MD Electrophysiologist:  Richardo Chandler, MD       Patient Profile:  Paroxysmal Atrial fibrillation  Coronary artery disease TTE 02/24/2023: EF 55-60, mild LVH, inferior HK, G1 DD, mild LAE, trivial MR, AV sclerosis, RAP 3 CCTA 02/13/2023: Proximal LAD >70, proximal LCx 50-69, proximal OM1 50-69; CAC score 457 (81st percentile); FFR LAD 0.8, 0.75; FFR LCx/OM 0.76 S/p 2.75 x 28 mm DES to the mid LCx [facility] 04/23/2023: LAD ostial 50; LCx proximal 80 (PCI); RPL 160 PET MPI 05/09/2024: Anterior ischemia, EF 35, global MBF are abnormal at 1.58 LHC 05/11/2024: LCx stent patent; LAD ostial 40, LM ostial 20; RPL 160 (HFrEF) heart failure with reduced ejection fraction  TTE 06/02/2024: EF 25-30, global HK, mild LVH, GRII DD, mild reduced RVSF, mildly elevated PASP, RVSP 36.9, moderate LAE, mild MR, AV sclerosis Left Bundle Branch Block OSA  Hypertension  Diabetes mellitus  Hypothyroidism  Rheumatoid arthritis  Asthma  Anemia  Chronic kidney disease         Discussed the use of AI scribe software for clinical note transcription with the patient, who gave verbal consent to proceed. History of Present Illness Destiny Brown is a 78 y.o. female  She was seen by Mertha Abrahams, PA-C in 04/2024 with shortness of breath. PET MPI was arranged. However, she was admitted 5/13-5/16 with atrial fibrillation with rapid ventricular rate. Atrial fibrillation started after she finished her PET MPI scan.  She had associated wide-complex tachycardia.  She underwent cardioversion.  MPI demonstrated anterior ischemia.  Follow-up cardiac catheterization demonstrated patent stent and no obstructive disease elsewhere.  Of note, Synthroid  was adjusted due to low TSH during that admission.  She was readmitted 6/6-6/14 with acute CHF.  She was diuresed  with IV furosemide .  Echocardiogram demonstrated new reduction in LV function with EF 25-30.  She developed atrial fibrillation with rapid rate and was placed on amiodarone .  She converted to sinus rhythm.  Recommendation at discharge was to take amiodarone  200 mg twice daily for 1 week, then 200 mg daily.  She had AKI in the setting of diuresis.  Creatinine was improving at discharge.  At discharge, furosemide , Entresto  and spironolactone  were held.  Amlodipine  was added for blood pressure control.  She has not required any doses of Lasix  since discharge, and her weight has remained stable. She has diabetes and is currently taking metformin  once daily. Her A1c last month was 6.1. No issues with bladder infections. No chest discomfort, heaviness, pressure, bleeding, black stools, or bloody stools. Breathing well, no swelling, and able to sleep well and lay flat without difficulty breathing.    ROS-See HPI    Studies Reviewed:      Results LABS LDL: 62 (05/03/2024) Triglycerides: 177 (05/03/2024) HDL: 66 (05/03/2024) Creatinine: 2.89 Hemoglobin A1c: 6.1  Risk Assessment/Calculations:  CHA2DS2-VASc Score = 6   This indicates a 9.7% annual risk of stroke. The patient's score is based upon: CHF History: 0 HTN History: 1 Diabetes History: 1 Stroke History: 0 Vascular Disease History: 1 Age Score: 2 Gender Score: 1    HYPERTENSION CONTROL Vitals:   06/13/24 1505 06/13/24 1722  BP: (!) 152/62 (!) 154/56    The patient's blood pressure is elevated above target today.  In order to address the patient's elevated BP: A current anti-hypertensive medication was  adjusted today.         Physical Exam:  VS:  BP (!) 154/56   Pulse (!) 52   Ht 5' 4 (1.626 m)   Wt 190 lb (86.2 kg)   SpO2 94%   BMI 32.61 kg/m    Wt Readings from Last 3 Encounters:  06/13/24 190 lb (86.2 kg)  06/10/24 189 lb 2.5 oz (85.8 kg)  05/11/24 186 lb 8 oz (84.6 kg)    Constitutional:      Appearance:  Healthy appearance. Not in distress.  Neck:     Vascular: No JVR.  Pulmonary:     Breath sounds: Normal breath sounds. No wheezing. No rales.  Cardiovascular:     Bradycardia present. Regular rhythm.     Murmurs: There is no murmur.  Edema:    Peripheral edema absent.  Abdominal:     Palpations: Abdomen is soft.        Assessment and Plan:    Assessment & Plan HFrEF (heart failure with reduced ejection fraction) (HCC) Recent admission with acute CHF in the setting of atrial fibrillation (AFib) with rapid ventricular response (RVR). Her EF was down at 25-30. Guideline-directed medical therapy (GDMT) is limited by acute kidney injury (AKI) and chronic kidney disease (CKD). She is currently NYHA class II-IIB. Volume status and weight are stable since discharge. If renal function precludes the use of Entresto  or spironolactone , consider hydralazine  and nitrates. - Continue furosemide  40 mg daily as needed - Obtain follow-up basic metabolic panel (BMP) today - Consider adding SGLT2 inhibitor - Follow up with heart failure clinic in 2 weeks - Follow up with electrophysiology (EP) as planned in July - See either me or Dr. Chancy Comber in 6-8 weeks for follow-up - Consider hydralazine  and nitrates if renal function does not allow initiation of Entresto  or spironolactone  - Follow up echocardiogram when on maximum tolerated GDMT to reassess LVF Coronary artery disease involving native coronary artery of native heart without angina pectoris Status post drug-eluting stent (DES) to mid left circumflex artery (LCX) in April 2024. Recent cardiac catheterization in May 2025 showed patent LCX stent and mild to moderate non-obstructive disease elsewhere. No antiplatelet therapy needed as she is on Eliquis . She is not having symptoms of angina. Continue Crestor  5 mg twice weekly. Persistent atrial fibrillation (HCC) Status post cardioversion in May. Recurrent admission in June with AFib with RVR and acute  congestive heart failure (CHF). Converted to sinus rhythm on amiodarone . She is maintaining NSR. - Continue amiodarone  200 mg twice daily for a total of one week, then 200 mg daily thereafter - Continue Eliquis  5 mg twice daily - Decrease Eliquis  to 2.5 mg twice daily once she turns 79 if creatinine remains above 1.5 - Follow up with EP as planned Stage 3a chronic kidney disease (HCC) Long-standing CKD, stage 3. Recent AKI exacerbated condition. Establishing care with nephrology next month.   Primary hypertension Blood pressure uncontrolled.   - Increase amlodipine  to 5 mg daily Pure hypercholesterolemia On Crestor  5 mg twice weekly. Recent LDL optimal. - Continue Crestor  5 mg twice weekly     Dispo:  Return in about 8 weeks (around 08/08/2024) for Routine Follow Up w/ Dr. Chancy Comber, or Marlyse Single, PA-C.  Signed, Marlyse Single, PA-C

## 2024-06-13 NOTE — Patient Instructions (Signed)
 Medication Instructions:  Your physician has recommended you make the following change in your medication:   INCREASE Amlodipine  to 5 mg taking 1 daily  *If you need a refill on your cardiac medications before your next appointment, please call your pharmacy*  Lab Work: TODAY:  BMET  If you have labs (blood work) drawn today and your tests are completely normal, you will receive your results only by: MyChart Message (if you have MyChart) OR A paper copy in the mail If you have any lab test that is abnormal or we need to change your treatment, we will call you to review the results.  Testing/Procedures: None ordered  Follow-Up: At [facility], you and your health needs are our priority.  As part of our continuing mission to provide you with exceptional heart care, our providers are all part of one team.  This team includes your primary Cardiologist (physician) and Advanced Practice Providers or APPs (Physician Assistants and Nurse Practitioners) who all work together to provide you with the care you need, when you need it.  Your next appointment:   1 month(s)  Provider:   Marlyse Single, PA-C          We recommend signing up for the patient portal called MyChart.  Sign up information is provided on this After Visit Summary.  MyChart is used to connect with patients for Virtual Visits (Telemedicine).  Patients are able to view lab/test results, encounter notes, upcoming appointments, etc.  Non-urgent messages can be sent to your provider as well.   To learn more about what you can do with MyChart, go to [email or web address].   Other Instructions

## 2024-06-13 NOTE — Assessment & Plan Note (Signed)
 Status post cardioversion in May. Recurrent admission in June with AFib with RVR and acute congestive heart failure (CHF). Converted to sinus rhythm on amiodarone . She is maintaining NSR. - Continue amiodarone  200 mg twice daily for a total of one week, then 200 mg daily thereafter - Continue Eliquis  5 mg twice daily - Decrease Eliquis  to 2.5 mg twice daily once she turns 79 if creatinine remains above 1.5 - Follow up with EP as planned

## 2024-06-14 ENCOUNTER — Ambulatory Visit: Payer: Self-pay | Admitting: Physician Assistant

## 2024-06-14 ENCOUNTER — Telehealth: Payer: Self-pay | Admitting: *Deleted

## 2024-06-14 DIAGNOSIS — Z79899 Other long term (current) drug therapy: Secondary | ICD-10-CM

## 2024-06-14 LAB — BASIC METABOLIC PANEL WITH GFR
BUN/Creatinine Ratio: 14 (ref 12–28)
BUN: 29 mg/dL — ABNORMAL HIGH (ref 8–27)
CO2: 20 mmol/L (ref 20–29)
Calcium: 9.9 mg/dL (ref 8.7–10.3)
Chloride: 102 mmol/L (ref 96–106)
Creatinine, Ser: 2.08 mg/dL — ABNORMAL HIGH (ref 0.57–1.00)
Glucose: 116 mg/dL — ABNORMAL HIGH (ref 70–99)
Potassium: 4.7 mmol/L (ref 3.5–5.2)
Sodium: 137 mmol/L (ref 134–144)
eGFR: 24 mL/min/{1.73_m2} — ABNORMAL LOW (ref >59)

## 2024-06-14 MED ORDER — EMPAGLIFLOZIN 10 MG PO TABS: 10.0000 mg | ORAL_TABLET | Freq: Every day | ORAL | 0 refills | Status: DC

## 2024-06-14 MED ORDER — EMPAGLIFLOZIN 10 MG PO TABS: 10.0000 mg | ORAL_TABLET | Freq: Every day | ORAL | 3 refills | Status: AC

## 2024-06-14 NOTE — Telephone Encounter (Signed)
 Hey, it's ready and it'll be upfront at the coumadin check in desk.  Call placed to pt and she has been made aware that samples are ready to pick up and where to go to get them.

## 2024-06-14 NOTE — Telephone Encounter (Signed)
 Jardiance 10 mg taking 1 tablet daily

## 2024-06-14 NOTE — Telephone Encounter (Signed)
 Medication name/dosage: Samples List: Jardiance 10 mg  Administration instructions:   Reason for samples: Reason for samples: new start  Ordering provider:  *Once above information entered, route the phone encounter to CV DIV MAG ST SAMPLES and send Teams message to team member assigned to Samples for the day.

## 2024-06-15 DIAGNOSIS — I480 Paroxysmal atrial fibrillation: Secondary | ICD-10-CM | POA: Diagnosis not present

## 2024-06-26 ENCOUNTER — Encounter (HOSPITAL_COMMUNITY): Payer: Self-pay

## 2024-06-26 ENCOUNTER — Ambulatory Visit (HOSPITAL_COMMUNITY)
Admit: 2024-06-26 | Discharge: 2024-06-26 | Disposition: A | Discharge status: Home / Self Care | Attending: Cardiology | Admitting: Cardiology

## 2024-06-26 VITALS — BP 160/80 | HR 51 | Wt 189.0 lb

## 2024-06-26 DIAGNOSIS — I5022 Chronic systolic (congestive) heart failure: Secondary | ICD-10-CM | POA: Diagnosis not present

## 2024-06-26 DIAGNOSIS — I2510 Atherosclerotic heart disease of native coronary artery without angina pectoris: Secondary | ICD-10-CM | POA: Diagnosis not present

## 2024-06-26 DIAGNOSIS — Z79899 Other long term (current) drug therapy: Secondary | ICD-10-CM | POA: Insufficient documentation

## 2024-06-26 DIAGNOSIS — G4733 Obstructive sleep apnea (adult) (pediatric): Secondary | ICD-10-CM | POA: Diagnosis not present

## 2024-06-26 DIAGNOSIS — R0609 Other forms of dyspnea: Secondary | ICD-10-CM | POA: Diagnosis not present

## 2024-06-26 DIAGNOSIS — N1831 Chronic kidney disease, stage 3a: Secondary | ICD-10-CM | POA: Diagnosis not present

## 2024-06-26 DIAGNOSIS — I447 Left bundle-branch block, unspecified: Secondary | ICD-10-CM | POA: Diagnosis not present

## 2024-06-26 DIAGNOSIS — Z7901 Long term (current) use of anticoagulants: Secondary | ICD-10-CM | POA: Diagnosis not present

## 2024-06-26 DIAGNOSIS — E785 Hyperlipidemia, unspecified: Secondary | ICD-10-CM | POA: Insufficient documentation

## 2024-06-26 DIAGNOSIS — I130 Hypertensive heart and chronic kidney disease with heart failure and stage 1 through stage 4 chronic kidney disease, or unspecified chronic kidney disease: Secondary | ICD-10-CM | POA: Diagnosis not present

## 2024-06-26 DIAGNOSIS — I480 Paroxysmal atrial fibrillation: Secondary | ICD-10-CM | POA: Insufficient documentation

## 2024-06-26 DIAGNOSIS — R001 Bradycardia, unspecified: Secondary | ICD-10-CM | POA: Insufficient documentation

## 2024-06-26 DIAGNOSIS — Z955 Presence of coronary angioplasty implant and graft: Secondary | ICD-10-CM | POA: Insufficient documentation

## 2024-06-26 MED ORDER — ISOSORBIDE MONONITRATE ER 30 MG PO TB24: 30.0000 mg | ORAL_TABLET | Freq: Every day | ORAL | 11 refills | Status: DC

## 2024-06-26 MED ORDER — HYDRALAZINE HCL 25 MG PO TABS: 25.0000 mg | ORAL_TABLET | Freq: Three times a day (TID) | ORAL | 11 refills | Status: DC

## 2024-06-26 NOTE — Patient Instructions (Signed)
 Start Hydralazine  25 mg three times daily - Rx sent. Start Imdur 30 mg daily - Rx sent. Return to Heart Failure TOC Clinic in 2 weeks - see below. Please call us  at [phone] if any questions or concerns prior to your next visit.

## 2024-06-26 NOTE — Progress Notes (Signed)
 HEART & VASCULAR TRANSITION OF CARE CONSULT NOTE     Referring Physician: Dr. Noralee   Chief Complaint: F/u for systolic heart failure   HPI: Referred to clinic by Dr. Arrien for heart failure consultation.   78 y/o female w/ h/o CAD s/p DES to mLCx in 03/2023, PAF, HTN, HLD, CKD IIIa and newly diagnosed systolic heart failure.   Echo 2/24 showed normal LVEF EF 55-60%, RV nl.   Repeat LHC 5/25 w/ patent LCx stent and mild-mod non-obstructive disease elsewhere.  Had recent admission 6/25 for acute CHF in the setting of afib w/ RVR. Echo showed drop in EF down to 25-30%, global HK, RV mildly reduced, LA moderately dilated, mild MR. Drop in EF felt to be tachymediated. She was diuresed w/ IV Lasix  and placed on amio gtt for afib. She converted back to NSR on amio gtt.  Transitioned to PO amio 200 mg BID x 1 week then daily 200 mg. After diuresis w/ IV Lasix , was started on HF GDMT and PO Lasix  PRN. Referred to [facility] clinic at d/c.   She presents today for assessment. Here w/ her husband. Doing well. EKG shows sinus brady 52 bpm. Denies dizziness. No fatigue. No chest pain. Denies symptoms of breakthrough Afib. No resting dyspnea. Only mild exertional dyspnea w/ moderate level activities. Wt stable at home. No LEE, orthopnea or PND. Reports full compliance w/ meds. No abnormal bleeding w/ Eliquis . Does not sleep w/ CPAP. BP elevated 160/80, c/w home BP readings.     Cardiac Testing   LHC 5/25   1st RPL lesion is 60% stenosed.   Ost LAD to Prox LAD lesion is 40% stenosed.   Ost LM lesion is 20% stenosed.   Non-stenotic Prox Cx to Mid Cx lesion was previously treated.   1.  Widely patent left circumflex stent with stable moderate proximal LAD stenosis.  No evidence of obstructive disease. 2.  Left ventricular angiography was not performed due to chronic kidney disease.  Mildly elevated left ventricular end-diastolic pressure at 20 mmHg.  2D Echo 6/25 1. There is an L wave in the  mitral inflow pattern suggesting significant  diastolic dysfunction and increased filling pressures. Left ventricular  ejection fraction, by estimation, is 25 to 30%. The left ventricle has  severely decreased function. The  left ventricle demonstrates global hypokinesis. There is mild concentric  left ventricular hypertrophy. Left ventricular diastolic parameters are  consistent with Grade II diastolic dysfunction (pseudonormalization).   2. Right ventricular systolic function is mildly reduced. The right  ventricular size is normal. There is mildly elevated pulmonary artery  systolic pressure. The estimated right ventricular systolic pressure is  36.9 mmHg.   3. Left atrial size was moderately dilated.   4. The mitral valve is normal in structure. Mild mitral valve  regurgitation. No evidence of mitral stenosis.   5. The aortic valve is tricuspid. There is mild calcification of the  aortic valve. Aortic valve regurgitation is not visualized. Aortic valve  sclerosis/calcification is present, without any evidence of aortic  stenosis.    Past Medical History:  Diagnosis Date   Arthritis    Atrial fibrillation with RVR (HCC) 09/21/2014   Bronchitis    h/o   Diabetes mellitus    NO MEDS,  DIET CONTROLLED   History of migraines    when working under alot of stress   Hypertension    Hypothyroid    LBBB (left bundle branch block) 09/22/2014   OSA (obstructive  sleep apnea) 04/09/2015   does not use cpap   PONV (postoperative nausea and vomiting)    PT DOESNOT WANT TO TAKE HER ATENOLOL      DOS.    IT DROPS MY BP TO LOW    Current Outpatient Medications  Medication Sig Dispense Refill   albuterol  (VENTOLIN  HFA) 108 (90 Base) MCG/ACT inhaler Inhale 2 puffs into the lungs every 6 (six) hours as needed for wheezing or shortness of breath (Cough). 18 g 2   amiodarone  (PACERONE ) 200 MG tablet Take 200 mg by mouth daily.     amLODipine  (NORVASC ) 5 MG tablet Take 1 tablet (5 mg total)  by mouth daily. 90 tablet 3   apixaban  (ELIQUIS ) 5 MG TABS tablet Take 1 tablet (5 mg total) by mouth 2 (two) times daily. 60 tablet 1   Cholecalciferol  (VITAMIN D3) 1000 UNITS CAPS Take 1,000 Units by mouth in the morning.     cyanocobalamin (FINEST NUTRITION VITAMIN B-12) 500 MCG tablet Take 500 mcg by mouth daily.     empagliflozin  (JARDIANCE ) 10 MG TABS tablet Take 1 tablet (10 mg total) by mouth daily before breakfast. 90 tablet 3   empagliflozin  (JARDIANCE ) 10 MG TABS tablet Take 1 tablet (10 mg total) by mouth daily before breakfast. 28 tablet 0   furosemide  (LASIX ) 40 MG tablet Take 1 tablet (40 mg total) by mouth daily as needed for edema or fluid. As needed in case of weight gain 1 to 2 lbs in 24 hrs or 5 lbs in 7 days. 30 tablet 5   hydrALAZINE  (APRESOLINE ) 25 MG tablet Take 1 tablet (25 mg total) by mouth 3 (three) times daily. 90 tablet 11   isosorbide mononitrate (IMDUR) 30 MG 24 hr tablet Take 1 tablet (30 mg total) by mouth daily. 30 tablet 11   latanoprost  (XALATAN ) 0.005 % ophthalmic solution Place 1 drop into both eyes at bedtime.     levothyroxine  (SYNTHROID ) 100 MCG tablet Take 1 tablet (100 mcg total) by mouth daily at 6 (six) AM. 30 tablet 1   metFORMIN  (GLUCOPHAGE ) 1000 MG tablet Take 1 tablet (1,000 mg total) by mouth daily with breakfast.     pregabalin  (LYRICA ) 50 MG capsule Take 1 capsule (50 mg total) by mouth daily. 30 capsule 0   rosuvastatin  (CRESTOR ) 5 MG tablet Take 5 mg by mouth 2 (two) times a week. Mondays & Thursdays.     No current facility-administered medications for this encounter.    Allergies  Allergen Reactions   Oxycodone Other (See Comments)    Severe headaches, hallucinations    Codeine Other (See Comments)    Severe Headaches with codeine; but not with other opioids      Social History   Socioeconomic History   Marital status: Married    Spouse name: Pasco   Number of children: 2   Years of education: 13   Highest education level:  Not on file  Occupational History   Occupation: part [employer]   Occupation: retired  Tobacco Use   Smoking status: Never   Smokeless tobacco: Never  Vaping Use   Vaping status: Never Used  Substance and Sexual Activity   Alcohol use: No   Drug use: No   Sexual activity: Never  Other Topics Concern   Not on file  Social History Narrative   Patient drinks about 2-3 cups of caffeine daily.   Patient is right handed.    Social Drivers of Corporate investment banker Strain: Low Risk  (  06/05/2024)   Overall Financial Resource Strain (CARDIA)    Difficulty of Paying Living Expenses: Not hard at all  Food Insecurity: No Food Insecurity (06/03/2024)   Hunger Vital Sign    Worried About Running Out of Food in the Last Year: Never true    Ran Out of Food in the Last Year: Never true  Transportation Needs: No Transportation Needs (06/05/2024)   PRAPARE - Administrator, Civil Service (Medical): No    Lack of Transportation (Non-Medical): No  Physical Activity: Not on file  Stress: Not on file  Social Connections: Socially Integrated (06/03/2024)   Social Connection and Isolation Panel    Frequency of Communication with Friends and Family: More than three times a week    Frequency of Social Gatherings with Friends and Family: More than three times a week    Attends Religious Services: More than 4 times per year    Active Member of [employer] or Organizations: Yes    Attends Engineer, structural: More than 4 times per year    Marital Status: Married  Catering manager Violence: Not At Risk (06/03/2024)   Humiliation, Afraid, Rape, and Kick questionnaire    Fear of Current or Ex-Partner: No    Emotionally Abused: No    Physically Abused: No    Sexually Abused: No      Family History  Adopted: Yes  Family history unknown: Yes    Vitals:   06/26/24 1016  BP: (!) 160/80  Pulse: (!) 51  SpO2: 97%  Weight: 85.7 kg (189 lb)    PHYSICAL EXAM: General:  Well  appearing. No respiratory difficulty HEENT: normal Neck: supple. no JVD. Carotids 2+ bilat; no bruits. No lymphadenopathy or thryomegaly appreciated. Cor: PMI nondisplaced. Regular rate & rhythm. No rubs, gallops or murmurs. Lungs: clear Abdomen: soft, nontender, nondistended. No hepatosplenomegaly. No bruits or masses. Good bowel sounds. Extremities: no cyanosis, clubbing, rash, edema Neuro: alert & oriented x 3, cranial nerves grossly intact. moves all 4 extremities w/o difficulty. Affect pleasant.  ECG:NSR 52 bpm, LBBB   ASSESSMENT & PLAN:  1. Chronic Systolic Heart Failure - Felt to be tachymediated in setting of afib w/ RVR  - Echo 2/24 EF 55-60%, RV nl - Echo 6/25 EF 25-30%, RV mildly reduced (LHC done just prior to echo 5/25 showed patent LCx stent and mild nonobstructive dz elsewhere) - Euvolemic on exam. Maintaining NSR  - NYHA Class II - Continue Jardiance  10 mg daily  - No ARNi/MRA given CKD (baseline SCr ~2.0) - start Imdur 30 daily + Hydralazine  25 mg tid for afterload reduction  - will need repeat Echo in 3 months. If EF not improving despite maintenance of NSR, will need additional w/u to r/o other causes. Would recommend cMRI. If EF remains < 35%, would recommend CRT device given LBBB   2. PAF - EKG today shows SB, 52 bpm  - Amiodarone  200 mg daily. Check amio labs next visit  - Continue Eliquis  5 mg bid. Denies abnormal bleeding - she has h/o OSA but dose not wear CPAP due to intolerance. If afib burden increases, would recommend she retry CPAP therapy. She would likely need repeat sleep study   3. CAD - s/p mLCx stent 4/25 - patent stent on repeat LHC 5/25, mild nonosbtructive dz elsewhere  - stable w/ CP - continue statin  - no ASA given need for Eliquis . PCI has been > 1 year   4. CKD IIIa - baseline SCr ~2.0 -  on Jardiance . Reviewed labs from last wk, SCr stable - check BMP on return visit next wk   5. Hypertension  - BP elevated 160/80, c/w home  readings - add Imdur/Hydral per above - reassess at next visit and if BPs better controlled, can stop amlodipine  and further titrate up HF GDMT     Referred to HFSW (PCP, Medications, Transportation, ETOH Abuse, Drug Abuse, Insurance, Financial ): No  Refer to Pharmacy: No  Refer to Home Health: No  Refer to Advanced Heart Failure Clinic: No  Refer to General Cardiology: Yes   Follow up  in [facility] clinic in 2 wks to help further titrate up GDMT.    Caffie Shed, PA-C 06/26/2024

## 2024-06-26 NOTE — Progress Notes (Signed)
 HEART IMPACT TRANSITIONS OF CARE    PCP: Nichole Senior, MD  Primary Cardiologist:  Chief Complaint: Heart Failure  HPI: 78 y/o female w/ h/o CAD s/p DES to mLCx in 03/2023, PAF, HTN, HLD, CKD IIIa and newly diagnosed systolic heart failure.    Echo 2/24 showed normal LVEF EF 55-60%, RV nl.    Repeat LHC 5/25 w/ patent LCx stent and mild-mod non-obstructive disease elsewhere.   Had recent admission 6/25 for acute CHF in the setting of afib w/ RVR. Echo showed drop in EF down to 25-30%, global HK, RV mildly reduced, LA moderately dilated, mild MR. Drop in EF felt to be tachymediated. She was diuresed w/ IV Lasix  and placed on amio gtt for afib. She converted back to NSR on amio gtt.  Transitioned to PO amio 200 mg BID x 1 week then daily 200 mg. After diuresis w/ IV Lasix , was started on HF GDMT and PO Lasix  PRN. Referred to [facility] clinic at d/c.   Today she returns for [facility] clinic visit. Overall feeling ok just tired. Denies palpitations, CP, dizziness, or PND/Orthopnea. Intt edema in LLL. SOB with exertion, more recently with minimal exertion. Appetite ok, eats small meals and watches sodium intake. No fever or chills. Weight at home 185-187 pounds. Taking all medications, only misses mid day dose of hydralazine  at times. Denies ETOH, tobacco or drug use. BP at home usually around 130s-150s. Last took PRN lasix  4th of July weekend. Does drink a lot of water .   ROS: All systems negative except as listed in HPI, PMH and Problem List.  SH:  Social History   Socioeconomic History   Marital status: Married    Spouse name: Pasco   Number of children: 2   Years of education: 13   Highest education level: Not on file  Occupational History   Occupation: part [employer]   Occupation: retired  Tobacco Use   Smoking status: Never   Smokeless tobacco: Never  Vaping Use   Vaping status: Never Used  Substance and Sexual Activity   Alcohol use: No   Drug use: No   Sexual  activity: Never  Other Topics Concern   Not on file  Social History Narrative   Patient drinks about 2-3 cups of caffeine daily.   Patient is right handed.    Social Drivers of Corporate investment banker Strain: Low Risk  (06/05/2024)   Overall Financial Resource Strain (CARDIA)    Difficulty of Paying Living Expenses: Not hard at all  Food Insecurity: No Food Insecurity (06/03/2024)   Hunger Vital Sign    Worried About Running Out of Food in the Last Year: Never true    Ran Out of Food in the Last Year: Never true  Transportation Needs: No Transportation Needs (06/05/2024)   PRAPARE - Administrator, Civil Service (Medical): No    Lack of Transportation (Non-Medical): No  Physical Activity: Not on file  Stress: Not on file  Social Connections: Socially Integrated (06/03/2024)   Social Connection and Isolation Panel    Frequency of Communication with Friends and Family: More than three times a week    Frequency of Social Gatherings with Friends and Family: More than three times a week    Attends Religious Services: More than 4 times per year    Active Member of [employer] or Organizations: Yes    Attends Banker Meetings: More than 4 times per year    Marital  Status: Married  Catering manager Violence: Not At Risk (06/03/2024)   Humiliation, Afraid, Rape, and Kick questionnaire    Fear of Current or Ex-Partner: No    Emotionally Abused: No    Physically Abused: No    Sexually Abused: No    FH:  Family History  Adopted: Yes  Family history unknown: Yes    Past Medical History:  Diagnosis Date   Arthritis    Atrial fibrillation with RVR (HCC) 09/21/2014   Bronchitis    h/o   Diabetes mellitus    NO MEDS,  DIET CONTROLLED   History of migraines    when working under alot of stress   Hypertension    Hypothyroid    LBBB (left bundle branch block) 09/22/2014   OSA (obstructive sleep apnea) 04/09/2015   does not use cpap   PONV (postoperative nausea and  vomiting)    PT DOESNOT WANT TO TAKE HER ATENOLOL      DOS.    IT DROPS MY BP TO LOW    Current Outpatient Medications  Medication Sig Dispense Refill   albuterol  (VENTOLIN  HFA) 108 (90 Base) MCG/ACT inhaler Inhale 2 puffs into the lungs every 6 (six) hours as needed for wheezing or shortness of breath (Cough). 18 g 2   amiodarone  (PACERONE ) 200 MG tablet Take 200 mg by mouth daily.     amLODipine  (NORVASC ) 5 MG tablet Take 1 tablet (5 mg total) by mouth daily. 90 tablet 3   apixaban  (ELIQUIS ) 5 MG TABS tablet Take 1 tablet (5 mg total) by mouth 2 (two) times daily. 60 tablet 1   Cholecalciferol  (VITAMIN D3) 1000 UNITS CAPS Take 1,000 Units by mouth in the morning.     cyanocobalamin (FINEST NUTRITION VITAMIN B-12) 500 MCG tablet Take 500 mcg by mouth daily.     empagliflozin  (JARDIANCE ) 10 MG TABS tablet Take 1 tablet (10 mg total) by mouth daily before breakfast. 90 tablet 3   furosemide  (LASIX ) 40 MG tablet Take 1 tablet (40 mg total) by mouth daily as needed for edema or fluid. As needed in case of weight gain 1 to 2 lbs in 24 hrs or 5 lbs in 7 days. 30 tablet 5   hydrALAZINE  (APRESOLINE ) 25 MG tablet Take 1 tablet (25 mg total) by mouth 3 (three) times daily. 90 tablet 11   isosorbide  mononitrate (IMDUR ) 30 MG 24 hr tablet Take 1 tablet (30 mg total) by mouth daily. 30 tablet 11   latanoprost  (XALATAN ) 0.005 % ophthalmic solution Place 1 drop into both eyes at bedtime.     levothyroxine  (SYNTHROID ) 100 MCG tablet Take 1 tablet (100 mcg total) by mouth daily at 6 (six) AM. 30 tablet 1   metFORMIN  (GLUCOPHAGE ) 1000 MG tablet Take 1 tablet (1,000 mg total) by mouth daily with breakfast.     pregabalin  (LYRICA ) 50 MG capsule Take 1 capsule (50 mg total) by mouth daily. 30 capsule 0   rosuvastatin  (CRESTOR ) 5 MG tablet Take 5 mg by mouth 2 (two) times a week. Mondays & Thursdays.     No current facility-administered medications for this encounter.    Vitals:   07/10/24 0851  BP: (!)  146/60  Pulse: (!) 57  SpO2: 98%  Weight: 86.6 kg (191 lb)  Height: 5' 4 (1.626 m)   PHYSICAL EXAM: General:  well appearing.  No respiratory difficulty Neck: supple. JVD difficult to see but it does not appear elevated.  Cor: PMI nondisplaced. Regular rate & rhythm. No rubs, gallops  or murmurs. Lungs: clear Extremities: no cyanosis, clubbing, rash, non-pitting RLE edema  Neuro: alert & oriented x 3. Moves all 4 extremities w/o difficulty. Affect pleasant.   ReDs reading: 28 %, normal  ECG: SB 50s LBBB reviewed from 6/25  ASSESSMENT & PLAN: 1. Chronic Systolic Heart Failure - Felt to be tachymediated in setting of afib w/ RVR  - Echo 2/24 EF 55-60%, RV nl - Echo 6/25 EF 25-30%, RV mildly reduced (LHC done just prior to echo 5/25 showed patent LCx stent and mild nonobstructive dz elsewhere) - Euvolemic on exam. Continue lasix  40 mg PRN  - NYHA Class II-IIIa - Continue Jardiance  10 mg daily  - No ARNi/MRA given CKD (baseline SCr ~2.0) - Continue Imdur  30 daily. Increase Hydralazine  25>50 mg tid for afterload reduction. Asked her to be more compliant with TID dosing - will need repeat Echo in 3 months. If EF not improving despite maintenance of NSR, will need additional w/u to r/o other causes. Would recommend cMRI. If EF remains < 35%, would recommend CRT device given LBBB  - Persistent SOB despite adequate diuresis / fluid control. Can consider PFTs for further workup.  - Discussed 2 L fluid restriction    2. PAF - Regular on exam - Amiodarone  200 mg daily. Check amio labs - Continue Eliquis  5 mg bid. Denies abnormal bleeding - she has h/o OSA but dose not wear CPAP due to intolerance. If afib burden increases, would recommend she retry CPAP therapy. She would likely need repeat sleep study    3. CAD - s/p mLCx stent 4/25 - patent stent on repeat LHC 5/25, mild nonosbtructive dz elsewhere  - stable w/ CP - continue statin  - no ASA given need for Eliquis . PCI has been > 1  year    4. CKD IIIa - baseline SCr ~2.0 - on Jardiance . Denies GU symptoms - check CMP   5. Hypertension  - BP elevated 146/60, c/w home readings - Continue amlodipine  5 mg daily - Continue Imdur /Hydral per above. Increase hydral today - Would attempt to wean off amlodipine   Follow up as scheduled with [facility]

## 2024-06-29 ENCOUNTER — Other Ambulatory Visit: Payer: Self-pay

## 2024-06-29 DIAGNOSIS — Z79899 Other long term (current) drug therapy: Secondary | ICD-10-CM

## 2024-06-30 ENCOUNTER — Ambulatory Visit: Payer: Self-pay | Admitting: Physician Assistant

## 2024-06-30 LAB — BASIC METABOLIC PANEL WITH GFR
BUN/Creatinine Ratio: 13 (ref 12–28)
BUN: 24 mg/dL (ref 8–27)
CO2: 18 mmol/L — ABNORMAL LOW (ref 20–29)
Calcium: 9.8 mg/dL (ref 8.7–10.3)
Chloride: 104 mmol/L (ref 96–106)
Creatinine, Ser: 1.92 mg/dL — ABNORMAL HIGH (ref 0.57–1.00)
Glucose: 134 mg/dL — ABNORMAL HIGH (ref 70–99)
Potassium: 4.4 mmol/L (ref 3.5–5.2)
Sodium: 140 mmol/L (ref 134–144)
eGFR: 26 mL/min/1.73 — ABNORMAL LOW (ref >59)

## 2024-07-03 ENCOUNTER — Ambulatory Visit: Admitting: Physician Assistant

## 2024-07-06 ENCOUNTER — Ambulatory Visit
Admission: RE | Admit: 2024-07-06 | Discharge: 2024-07-06 | Disposition: A | Discharge status: Home / Self Care | Source: Ambulatory Visit | Attending: Endocrinology | Admitting: Endocrinology

## 2024-07-06 DIAGNOSIS — Z1231 Encounter for screening mammogram for malignant neoplasm of breast: Secondary | ICD-10-CM

## 2024-07-07 MED ORDER — AMLODIPINE BESYLATE 5 MG PO TABS
5.0000 mg | ORAL_TABLET | Freq: Every day | ORAL | 3 refills | Status: AC
Start: 2024-07-07 — End: ?

## 2024-07-07 NOTE — Telephone Encounter (Signed)
 Are you okay with sending this medications?

## 2024-07-07 NOTE — Telephone Encounter (Signed)
 That is correct. I increased the Amlodipine  to 5 mg once daily at last visit. Please fill Amlodipine .  Pregalbin must come from PCP or prescribing provider. Glendia Ferrier, PA-C    07/07/2024 2:37 PM

## 2024-07-10 ENCOUNTER — Encounter (HOSPITAL_COMMUNITY): Payer: Self-pay

## 2024-07-10 ENCOUNTER — Ambulatory Visit (HOSPITAL_COMMUNITY)
Admission: RE | Admit: 2024-07-10 | Discharge: 2024-07-10 | Disposition: A | Discharge status: Home / Self Care | Source: Ambulatory Visit | Attending: Internal Medicine

## 2024-07-10 ENCOUNTER — Ambulatory Visit (HOSPITAL_COMMUNITY): Payer: Self-pay | Admitting: Internal Medicine

## 2024-07-10 VITALS — BP 146/60 | HR 57 | Ht 64.0 in | Wt 191.0 lb

## 2024-07-10 DIAGNOSIS — Z7984 Long term (current) use of oral hypoglycemic drugs: Secondary | ICD-10-CM | POA: Insufficient documentation

## 2024-07-10 DIAGNOSIS — Z7901 Long term (current) use of anticoagulants: Secondary | ICD-10-CM | POA: Diagnosis not present

## 2024-07-10 DIAGNOSIS — Z955 Presence of coronary angioplasty implant and graft: Secondary | ICD-10-CM | POA: Diagnosis not present

## 2024-07-10 DIAGNOSIS — I2510 Atherosclerotic heart disease of native coronary artery without angina pectoris: Secondary | ICD-10-CM | POA: Diagnosis not present

## 2024-07-10 DIAGNOSIS — N1831 Chronic kidney disease, stage 3a: Secondary | ICD-10-CM

## 2024-07-10 DIAGNOSIS — Z79899 Other long term (current) drug therapy: Secondary | ICD-10-CM | POA: Insufficient documentation

## 2024-07-10 DIAGNOSIS — I130 Hypertensive heart and chronic kidney disease with heart failure and stage 1 through stage 4 chronic kidney disease, or unspecified chronic kidney disease: Secondary | ICD-10-CM | POA: Diagnosis not present

## 2024-07-10 DIAGNOSIS — R0602 Shortness of breath: Secondary | ICD-10-CM | POA: Diagnosis not present

## 2024-07-10 DIAGNOSIS — I5022 Chronic systolic (congestive) heart failure: Secondary | ICD-10-CM

## 2024-07-10 DIAGNOSIS — I10 Essential (primary) hypertension: Secondary | ICD-10-CM

## 2024-07-10 DIAGNOSIS — G4733 Obstructive sleep apnea (adult) (pediatric): Secondary | ICD-10-CM | POA: Insufficient documentation

## 2024-07-10 DIAGNOSIS — R7989 Other specified abnormal findings of blood chemistry: Secondary | ICD-10-CM

## 2024-07-10 DIAGNOSIS — I480 Paroxysmal atrial fibrillation: Secondary | ICD-10-CM

## 2024-07-10 LAB — COMPREHENSIVE METABOLIC PANEL WITH GFR
ALT: 17 U/L (ref 0–44)
AST: 20 U/L (ref 15–41)
Albumin: 4.2 g/dL (ref 3.5–5.0)
Alkaline Phosphatase: 73 U/L (ref 38–126)
Anion gap: 14 (ref 5–15)
BUN: 27 mg/dL — ABNORMAL HIGH (ref 8–23)
CO2: 22 mmol/L (ref 22–32)
Calcium: 10.1 mg/dL (ref 8.9–10.3)
Chloride: 99 mmol/L (ref 98–111)
Creatinine, Ser: 2.03 mg/dL — ABNORMAL HIGH (ref 0.44–1.00)
GFR, Estimated: 25 mL/min — ABNORMAL LOW (ref >60)
Glucose, Bld: 112 mg/dL — ABNORMAL HIGH (ref 70–99)
Potassium: 4.2 mmol/L (ref 3.5–5.1)
Sodium: 135 mmol/L (ref 135–145)
Total Bilirubin: 0.8 mg/dL (ref 0.0–1.2)
Total Protein: 7.7 g/dL (ref 6.5–8.1)

## 2024-07-10 LAB — TSH: TSH: 7.476 u[IU]/mL — ABNORMAL HIGH (ref 0.350–4.500)

## 2024-07-10 LAB — BRAIN NATRIURETIC PEPTIDE: B Natriuretic Peptide: 212.8 pg/mL — ABNORMAL HIGH (ref 0.0–100.0)

## 2024-07-10 MED ORDER — HYDRALAZINE HCL 50 MG PO TABS: 50.0000 mg | ORAL_TABLET | Freq: Three times a day (TID) | ORAL | 11 refills | Status: AC

## 2024-07-10 NOTE — Progress Notes (Signed)
 ReDS Vest / Clip - 07/10/24 0900       ReDS Vest / Clip   Station Marker A    Ruler Value 31    ReDS Value Range Low volume    ReDS Actual Value 28

## 2024-07-10 NOTE — Patient Instructions (Signed)
 Medication Changes:  INCREASE HYDRALAZINE  TO 50MG  THREE TIMES DAILY   Lab Work:  Labs done today, your results will be available in MyChart, we will contact you for abnormal readings.  Special Instructions // Education:  DECREASE INTAKE OF WATER /FLUID TO LESS THAN 2 LITERS PER DAY   Follow-Up in: as scheduled with General Cardiology   At the [facility], you and your health needs are our priority. We have a designated team specialized in the treatment of Heart Failure. This Care Team includes your primary Heart Failure Specialized Cardiologist (physician), Advanced Practice Providers (APPs- Physician Assistants and Nurse Practitioners), and Pharmacist who all work together to provide you with the care you need, when you need it.   You may see any of the following providers on your designated Care Team at your next follow up:  Dr. Toribio Fuel Dr. Ezra Shuck Dr. Ria Commander Dr. Odis Brownie Greig Mosses, NP Caffie Shed, [place] [employer] [place], [place] Beckey Coe, NP [place] Lee, NP Tinnie Redman, PharmD   Please be sure to bring in all your medications bottles to every appointment.   Need to Contact Us :  If you have any questions or concerns before your next appointment please send us  a message through Banks or call our office at [phone].    TO LEAVE A MESSAGE FOR THE NURSE SELECT OPTION 2, PLEASE LEAVE A MESSAGE INCLUDING: YOUR NAME DATE OF BIRTH CALL BACK NUMBER REASON FOR CALL**this is important as we prioritize the call backs  YOU WILL RECEIVE A CALL BACK THE SAME DAY AS LONG AS YOU CALL BEFORE 4:00 PM  \

## 2024-07-10 NOTE — Addendum Note (Signed)
 Encounter addended by: Washington Geofm NOVAK, RN on: 07/10/2024 1:21 PM  Actions taken: Order list changed, Diagnosis association updated

## 2024-07-10 NOTE — Addendum Note (Signed)
 Encounter addended by: Kosha Jaquith M, RN on: 07/10/2024 9:31 AM  Actions taken: Flowsheet accepted, Order list changed, Diagnosis association updated, Clinical Note Signed, Charge Capture section accepted

## 2024-07-12 ENCOUNTER — Ambulatory Visit: Payer: Self-pay | Admitting: Physician Assistant

## 2024-07-12 ENCOUNTER — Encounter: Payer: Self-pay | Admitting: Nephrology

## 2024-07-17 NOTE — Telephone Encounter (Signed)
 Called patient per Beckey Coe, PA with following lab results and instructions:  TSH elevated. Need to check T4.  Pt has appointment 07/27/24 at [facility] office. Order for T4 added on with appointment note asking that patient get T4. Pt will go by their lab for same.

## 2024-07-19 ENCOUNTER — Telehealth: Payer: Self-pay | Admitting: Internal Medicine

## 2024-07-19 NOTE — Telephone Encounter (Signed)
 Call back to patient to discuss concerns about symptoms. Patient last seen 06/13/24, she denied angina at that time.  Today, she reports that she had midsternal pricking discomfort/pain when she got into bed at 9 PM. She denies any heartburn symptoms, but states she was able to go to sleep easily so she doesn't remember how long it lasted.   This morning, on rising from bed, she noticed chest tightness/pressure. She denies SOB or leg swelling. Her HP today was 145/56, HR 52. Her SP02 was 97%.   She also reports that her balance has been off and she has had brain fog for about 1 week, which she says started when her hydralazine  dose was increased on 07/10/24. Of note, patient does have a TSH on lab work 9 days ago greater than 7, and has an upcoming order for a T4. She endorses she is taking all meds as ordered including Imdur .   Made DOD appt for patient on 07/21/24. Advised patient to call 911 or go to ED if chest pain worsens, is accompanied by SOB, or doesn't get better with rest. Patient verbalizes understanding.

## 2024-07-19 NOTE — Telephone Encounter (Signed)
 Pt c/o of Chest Pain: STAT if active (IN THIS MOMENT) CP, including tightness, pressure, jaw pain, shoulder/upper arm/back pain, SOB, nausea, and vomiting.  1. Are you having CP right now (tightness, pressure, or discomfort)? No   2. Are you experiencing any other symptoms (ex. SOB, nausea, vomiting, sweating)? No   3. How long have you been experiencing CP? Since yesterday   4. Is your CP continuous or coming and going? Coming and going   5. Have you taken Nitroglycerin ? No   6. If CP returns before callback, please consider calling 911. ?

## 2024-07-21 ENCOUNTER — Encounter: Payer: Self-pay | Admitting: Cardiology

## 2024-07-21 ENCOUNTER — Ambulatory Visit: Attending: Cardiology | Admitting: Cardiology

## 2024-07-21 VITALS — BP 130/50 | HR 54 | Ht 64.0 in | Wt 193.2 lb

## 2024-07-21 DIAGNOSIS — I5022 Chronic systolic (congestive) heart failure: Secondary | ICD-10-CM | POA: Diagnosis not present

## 2024-07-21 DIAGNOSIS — I2510 Atherosclerotic heart disease of native coronary artery without angina pectoris: Secondary | ICD-10-CM | POA: Diagnosis not present

## 2024-07-21 DIAGNOSIS — R7989 Other specified abnormal findings of blood chemistry: Secondary | ICD-10-CM

## 2024-07-21 DIAGNOSIS — I10 Essential (primary) hypertension: Secondary | ICD-10-CM | POA: Diagnosis not present

## 2024-07-21 DIAGNOSIS — I480 Paroxysmal atrial fibrillation: Secondary | ICD-10-CM

## 2024-07-21 DIAGNOSIS — I25119 Atherosclerotic heart disease of native coronary artery with unspecified angina pectoris: Secondary | ICD-10-CM

## 2024-07-21 DIAGNOSIS — R002 Palpitations: Secondary | ICD-10-CM

## 2024-07-21 NOTE — Patient Instructions (Signed)
 Medication Instructions:  Continue same medications *If you need a refill on your cardiac medications before your next appointment, please call your pharmacy*  Lab Work: Free t4 and tsh today  Testing/Procedures: Schedule Echo 09/10/24  Follow-Up: At [facility], you and your health needs are our priority.  As part of our continuing mission to provide you with exceptional heart care, our providers are all part of one team.  This team includes your primary Cardiologist (physician) and Advanced Practice Providers or APPs (Physician Assistants and Nurse Practitioners) who all work together to provide you with the care you need, when you need it.  Your next appointment:  4 months    Provider:  Dr.Jordan   We recommend signing up for the patient portal called MyChart.  Sign up information is provided on this After Visit Summary.  MyChart is used to connect with patients for Virtual Visits (Telemedicine).  Patients are able to view lab/test results, encounter notes, upcoming appointments, etc.  Non-urgent messages can be sent to your provider as well.   To learn more about what you can do with MyChart, go to [email or web address].

## 2024-07-21 NOTE — Progress Notes (Signed)
 Cardiology Office Note:    Date:  07/21/2024   ID:  KARIANNE NOGUEIRA, DOB 30-May-1946, MRN [id number]  PCP:  Nichole Senior, MD   [facility] Providers Cardiologist:  Soyla DELENA Merck, MD Cardiology APP:  Lelon Glendia DASEN, PA-C  Electrophysiologist:  Elspeth Sage, MD     Referring MD: Nichole Senior, MD   Chief Complaint  Patient presents with   Coronary Artery Disease   Congestive Heart Failure   Atrial Fibrillation    History of Present Illness:    Destiny UNCAPHER is a 78 y.o. female is seen for follow up Afib and CHF. She has a history of HTN, LBBB, OSA., CAD. Had Afib diagnosed in 2015. On OAC. Followed by Dr Sage. Event monitor in 2022 showed some runs of atrial tachycardia. In April 2024 she had stenting of the LCx. 40-50% LAD stenosis noted at that time.   She was admitted in May with AFib with RVR that occurred when she was undergoing a PET CT. Cardioverted in ED. Had abnormal PET CT leading to repeat cath showing nonobstructive CAD. EF 35% on PET imaging.  She was readmitted in June with CHF and recurrent Afib with RVR. EF 25-30% by Echo. Started on IV amiodarone  with subsequent return to NSR. CHF therapy limited by CKD with worsening creatinine 1.58>> creatinine 2.89. Entresto  and aldactone  discontinued. Repeat on follow up 2.0.  managed with hydralazine  and nitrates.   On follow up today she is seen with her husband. She notes she feels fatigued. She had one episode last Friday while standing where she became very lightheaded. Had to lie down. BP dropped to 94/43. Since then she has continued to feel lightheaded although for the most part her BP at home has been in the 150-170 range. No SOB or edema. Some chest heaviness. No Afib that she is aware of.   Past Medical History:  Diagnosis Date   Arthritis    Atrial fibrillation with RVR (HCC) 09/21/2014   Bronchitis    h/o   Diabetes mellitus    NO MEDS,  DIET CONTROLLED   History of migraines    when working  under alot of stress   Hypertension    Hypothyroid    LBBB (left bundle branch block) 09/22/2014   OSA (obstructive sleep apnea) 04/09/2015   does not use cpap   PONV (postoperative nausea and vomiting)    PT DOESNOT WANT TO TAKE HER ATENOLOL      DOS.    IT DROPS MY BP TO LOW    Past Surgical History:  Procedure Laterality Date   ABDOMINAL HYSTERECTOMY  03/2001   APPENDECTOMY     BACK SURGERY     CATARACT EXTRACTION, BILATERAL     WITH IMPLANTS   CERVICAL FUSION  08/2003   CHOLECYSTECTOMY  03/2010   COLONOSCOPY WITH PROPOFOL  N/A 08/27/2023   Procedure: COLONOSCOPY WITH PROPOFOL ;  Surgeon: Rollin Dover, MD;  Location: [facility];  Service: Gastroenterology;  Laterality: N/A;   CORONARY STENT INTERVENTION N/A 04/23/2023   Procedure: CORONARY STENT INTERVENTION;  Surgeon: Wonda Sharper, MD;  Location: [facility] INVASIVE CV LAB;  Service: Cardiovascular;  Laterality: N/A;   ESOPHAGOGASTRODUODENOSCOPY N/A 08/27/2023   Procedure: ESOPHAGOGASTRODUODENOSCOPY (EGD);  Surgeon: Rollin Dover, MD;  Location: [facility];  Service: Gastroenterology;  Laterality: N/A;   EYE SURGERY     GIVENS CAPSULE STUDY N/A 11/18/2023   Procedure: GIVENS CAPSULE STUDY;  Surgeon: Rollin Dover, MD;  Location: [facility] ENDOSCOPY;  Service: Gastroenterology;  Laterality: N/A;  LEFT HEART CATH AND CORONARY ANGIOGRAPHY N/A 04/23/2023   Procedure: LEFT HEART CATH AND CORONARY ANGIOGRAPHY;  Surgeon: Wonda Sharper, MD;  Location: [facility] INVASIVE CV LAB;  Service: Cardiovascular;  Laterality: N/A;   LEFT HEART CATH AND CORONARY ANGIOGRAPHY N/A 05/11/2024   Procedure: LEFT HEART CATH AND CORONARY ANGIOGRAPHY;  Surgeon: Darron Deatrice LABOR, MD;  Location: [facility] INVASIVE CV LAB;  Service: Cardiovascular;  Laterality: N/A;   LUMBAR DISC SURGERY     LUMBAR LAMINECTOMY/DECOMPRESSION MICRODISCECTOMY  10/2010   POSTERIOR FUSION LUMBAR SPINE  12/16/11; 12/2005; 07/2004   L2-3; L3-4; L4-5   REVERSE SHOULDER ARTHROPLASTY Right 02/26/2022    Procedure: REVERSE SHOULDER ARTHROPLASTY;  Surgeon: Dozier Soulier, MD;  Location: [facility];  Service: Orthopedics;  Laterality: Right;   TOTAL KNEE ARTHROPLASTY Right 03/18/2017   Procedure: RIGHT TOTAL KNEE ARTHROPLASTY;  Surgeon: Lamar Collet, MD;  Location: [facility];  Service: Orthopedics;  Laterality: Right;    Current Medications: Current Meds  Medication Sig   albuterol  (VENTOLIN  HFA) 108 (90 Base) MCG/ACT inhaler Inhale 2 puffs into the lungs every 6 (six) hours as needed for wheezing or shortness of breath (Cough).   amiodarone  (PACERONE ) 200 MG tablet Take 200 mg by mouth daily.   amLODipine  (NORVASC ) 5 MG tablet Take 1 tablet (5 mg total) by mouth daily.   apixaban  (ELIQUIS ) 5 MG TABS tablet Take 1 tablet (5 mg total) by mouth 2 (two) times daily.   Cholecalciferol  (VITAMIN D3) 1000 UNITS CAPS Take 1,000 Units by mouth in the morning.   cyanocobalamin (FINEST NUTRITION VITAMIN B-12) 500 MCG tablet Take 500 mcg by mouth daily.   empagliflozin  (JARDIANCE ) 10 MG TABS tablet Take 1 tablet (10 mg total) by mouth daily before breakfast.   furosemide  (LASIX ) 40 MG tablet Take 1 tablet (40 mg total) by mouth daily as needed for edema or fluid. As needed in case of weight gain 1 to 2 lbs in 24 hrs or 5 lbs in 7 days.   hydrALAZINE  (APRESOLINE ) 50 MG tablet Take 1 tablet (50 mg total) by mouth 3 (three) times daily.   isosorbide  mononitrate (IMDUR ) 30 MG 24 hr tablet Take 1 tablet (30 mg total) by mouth daily.   latanoprost  (XALATAN ) 0.005 % ophthalmic solution Place 1 drop into both eyes at bedtime.   levothyroxine  (SYNTHROID ) 100 MCG tablet Take 1 tablet (100 mcg total) by mouth daily at 6 (six) AM.   pregabalin  (LYRICA ) 50 MG capsule Take 1 capsule (50 mg total) by mouth daily.   rosuvastatin  (CRESTOR ) 5 MG tablet Take 5 mg by mouth 2 (two) times a week. Mondays & Thursdays.     Allergies:   Oxycodone and Codeine   Social History   Socioeconomic History   Marital status: Married     Spouse name: Pasco   Number of children: 2   Years of education: 13   Highest education level: Not on file  Occupational History   Occupation: part [employer]   Occupation: retired  Tobacco Use   Smoking status: Never   Smokeless tobacco: Never  Vaping Use   Vaping status: Never Used  Substance and Sexual Activity   Alcohol use: No   Drug use: No   Sexual activity: Never  Other Topics Concern   Not on file  Social History Narrative   Patient drinks about 2-3 cups of caffeine daily.   Patient is right handed.    Social Drivers of Health   Financial Resource Strain: Low Risk  (06/05/2024)  Overall Financial Resource Strain (CARDIA)    Difficulty of Paying Living Expenses: Not hard at all  Food Insecurity: No Food Insecurity (06/03/2024)   Hunger Vital Sign    Worried About Running Out of Food in the Last Year: Never true    Ran Out of Food in the Last Year: Never true  Transportation Needs: No Transportation Needs (06/05/2024)   PRAPARE - Administrator, Civil Service (Medical): No    Lack of Transportation (Non-Medical): No  Physical Activity: Not on file  Stress: Not on file  Social Connections: Socially Integrated (06/03/2024)   Social Connection and Isolation Panel    Frequency of Communication with Friends and Family: More than three times a week    Frequency of Social Gatherings with Friends and Family: More than three times a week    Attends Religious Services: More than 4 times per year    Active Member of [employer] or Organizations: Yes    Attends Engineer, structural: More than 4 times per year    Marital Status: Married     Family History: The patient's She was adopted. Family history is unknown by patient.  ROS:   Please see the history of present illness.     All other systems reviewed and are negative.  EKGs/Labs/Other Studies Reviewed:    The following studies were reviewed today: Cardiac cath 05/11/24:  LEFT HEART CATH AND  CORONARY ANGIOGRAPHY   Conclusion      1st RPL lesion is 60% stenosed.   Ost LAD to Prox LAD lesion is 40% stenosed.   Ost LM lesion is 20% stenosed.   Non-stenotic Prox Cx to Mid Cx lesion was previously treated.   1.  Widely patent left circumflex stent with stable moderate proximal LAD stenosis.  No evidence of obstructive disease. 2.  Left ventricular angiography was not performed due to chronic kidney disease.  Mildly elevated left ventricular end-diastolic pressure at 20 mmHg.   Recommendations: Continue medical therapy for coronary artery disease. Continue management of atrial fibrillation. Heparin  drip can be resumed 2 hours after TR band removal and she can be transition back to apixaban  in the evening if no further plans for any other invasive procedures. Monitor renal function.  35 mL of contrast was used for the procedure.   Echo 06/02/24: IMPRESSIONS     1. There is an L wave in the mitral inflow pattern suggesting significant  diastolic dysfunction and increased filling pressures. Left ventricular  ejection fraction, by estimation, is 25 to 30%. The left ventricle has  severely decreased function. The  left ventricle demonstrates global hypokinesis. There is mild concentric  left ventricular hypertrophy. Left ventricular diastolic parameters are  consistent with Grade II diastolic dysfunction (pseudonormalization).   2. Right ventricular systolic function is mildly reduced. The right  ventricular size is normal. There is mildly elevated pulmonary artery  systolic pressure. The estimated right ventricular systolic pressure is  36.9 mmHg.   3. Left atrial size was moderately dilated.   4. The mitral valve is normal in structure. Mild mitral valve  regurgitation. No evidence of mitral stenosis.   5. The aortic valve is tricuspid. There is mild calcification of the  aortic valve. Aortic valve regurgitation is not visualized. Aortic valve  sclerosis/calcification is  present, without any evidence of aortic  stenosis.   Conclusion(s)/Recommendation(s): Severe global LV dysfunction new since  previous. Doppler parameters suggestive of high left-side filling  pressures.  EKG Interpretation Date/Time:  Friday July 21 2024 14:52:11 EDT Ventricular Rate:  54 PR Interval:  156 QRS Duration:  156 QT Interval:  514 QTC Calculation: 487 R Axis:   -54  Text Interpretation: Sinus bradycardia Left axis deviation Left bundle branch block When compared with ECG of 26-Jun-2024 10:23, No significant change was found Confirmed by [place], Laurelai Lepp [id number]) on 07/21/2024 2:57:58 PM    Recent Labs: 06/07/2024: Hemoglobin 11.8; Platelets 194 06/09/2024: Magnesium  2.2 07/10/2024: ALT 17; B Natriuretic Peptide 212.8; BUN 27; Creatinine, Ser 2.03; Potassium 4.2; Sodium 135; TSH 7.476  Recent Lipid Panel    Component Value Date/Time   CHOL 157 05/03/2024 0914   TRIG 177 (H) 05/03/2024 0914   HDL 66 05/03/2024 0914   CHOLHDL 2.4 05/03/2024 0914   CHOLHDL 3.0 09/22/2014 0331   VLDL 24 09/22/2014 0331   LDLCALC 62 05/03/2024 0914   EKG Interpretation Date/Time:  Friday July 21 2024 14:52:11 EDT Ventricular Rate:  54 PR Interval:  156 QRS Duration:  156 QT Interval:  514 QTC Calculation: 487 R Axis:   -54  Text Interpretation: Sinus bradycardia Left axis deviation Left bundle branch block When compared with ECG of 26-Jun-2024 10:23, No significant change was found Confirmed by [place], Reda Citron [phone]) on 07/21/2024 2:57:58 PM    Risk Assessment/Calculations:    CHA2DS2-VASc Score = 7   This indicates a 11.2% annual risk of stroke. The patient's score is based upon: CHF History: 1 HTN History: 1 Diabetes History: 1 Stroke History: 0 Vascular Disease History: 1 Age Score: 2 Gender Score: 1               Physical Exam:    VS:  BP (!) 130/50   Pulse (!) 54   Ht 5' 4 (1.626 m)   Wt 193 lb 3.2 oz (87.6 kg)   SpO2 97%   BMI 33.16 kg/m     Wt Readings  from Last 3 Encounters:  07/21/24 193 lb 3.2 oz (87.6 kg)  07/10/24 191 lb (86.6 kg)  06/26/24 189 lb (85.7 kg)     GEN:  Well nourished,  in no acute distress HEENT: Normal NECK: No JVD; No carotid bruits LYMPHATICS: No lymphadenopathy CARDIAC: RRR, no murmurs, rubs, gallops RESPIRATORY:  Clear to auscultation without rales, wheezing or rhonchi  ABDOMEN: Soft, non-tender, non-distended MUSCULOSKELETAL:  No edema; No deformity  SKIN: Warm and dry NEUROLOGIC:  Alert and oriented x 3 PSYCHIATRIC:  Normal affect   ASSESSMENT:    1. Chronic systolic heart failure (HCC)   2. Elevated TSH   3. PAF (paroxysmal atrial fibrillation) (HCC)   4. Coronary artery disease involving native coronary artery of native heart without angina pectoris   5. Primary hypertension   6. Palpitations   7. Coronary artery disease involving native coronary artery of native heart with angina pectoris (HCC)    PLAN:    In order of problems listed above:  Atrial fibrillation. Maintaining NSR on amiodarone . On Eliquis . Will repeat TFTs today. Has follow up with EP. If recurrent may need to consider ablation Acute on chronic systolic CHF. EF 35% by PET CT- unclear if this was while she was in Afib or not. Ef showed EF 25-30%. No blockage on cath. EF a year ago was ok even in the presence of LBBB. Suspect more related to AFib with RVR. Will repeat Echo in  2 months. Continue hydralazine , nitrates, Jardiance . Not a candidate for ACEi/ARB/ARNI or aldactone  due to CKD. Sodium restriction. If EF remains low on follow up will  need to consider BiV pacing/ICD. CAD s/p stent of LCx in April 2024. No obstructive disease by recent cath. Off antiplatelet therapy due to being on [facility] CKD stage IV. Followed by Nephrology History of hypothyroidism on replacement. Dose reduced 2 months ago due to suppressed TSH. More recent TSH up. Will check TSH, Free T4 today.  HTN. BP acceptable today. Continue Rx DM off metformine due to CKD.  On Jardiance .            Medication Adjustments/Labs and Tests Ordered: Current medicines are reviewed at length with the patient today.  Concerns regarding medicines are outlined above.  Orders Placed This Encounter  Procedures   EKG 12-Lead   No orders of the defined types were placed in this encounter.   There are no Patient Instructions on file for this visit.   Signed, Lynette Topete [place], MD  07/21/2024 2:59 PM    [facility]

## 2024-07-22 LAB — TSH: TSH: 6.99 u[IU]/mL — ABNORMAL HIGH (ref 0.450–4.500)

## 2024-07-22 LAB — T4, FREE: Free T4: 1.67 ng/dL (ref 0.82–1.77)

## 2024-07-23 ENCOUNTER — Ambulatory Visit: Payer: Self-pay | Admitting: Cardiology

## 2024-07-24 NOTE — Progress Notes (Unsigned)
 Cardiology Office Note:  .   Date:  07/24/2024  ID:  Destiny Brown, DOB [birth year], MRN [id number] PCP: Nichole Senior, MD  [facility] Providers Cardiologist:  Soyla DELENA Merck, MD Cardiology APP:  Lelon Glendia DASEN, PA-C  Electrophysiologist:  Elspeth Sage, MD {  History of Present Illness: .   Destiny Brown is a 78 y.o. female w/PMHx of  HTN, hypothyroidism, OSA w/CPAP, DM, RA, asthma LBBB, CAD (PCI April 2024) AFib NICM  She saw Jodie 11/01/23, doing well, active.  I saw her 05/03/24 Reports for about 6 mo she has been getting more winded with exertion associated with a hurt in her chest, will resolve with rest, aware of a pounding in her chest. These are not every time she is active/ambulating, not every day, she can do the same activity one time have feel them and not another times Planned to get a stress PET and echo  05/09/24 had her PET stress done > after completed, she reported feeling dizzy, placed back om the monitor > AFib RVR ER > Admitted > cards consulted her stress test, was abnormal perfusion suggestive of ischemia  Rate controlled > heparin   In the ER developed WCT with concerns of possible VT (and low BPs post rate controlling meds)  and emergently CV > SR LHC 04/2024: non-obstructive CAD  Post cath, back in Afib Spontaneous > SB DISCHARGED 05/12/24  READMITTED 06/02/24 AFib w/RVR progressive SOB, amiodarone  in the ER LVEF 25-30% Diuresed Amio loaded Discharged 06/10/24  She saw th AHF team 06/26/24, maintaining SR, GDMT discussed, limited 2/2 CKD,  with plans towards updated echo in ~ 43mo  Labs done  She saw Dr. [place] 07/21/24, orthostatic dizziness, hypotension reported, no SOB, some chest heaviness, no Afib that she felt. Recent TSH was abnormal (with known hypothyroidism on replacement) > planned for labs Reduced LVEF suspect tachy-mediated  TSH 6.990 T4 wnl 1.67 No changes to tx made   Today's visit is scheduled as a post hospital  visit ROS:   *** 07/10/24 LFTs are OK *** AFib? *** eliquis , dose, bleeding *** volume   AFib hx: Dx 2015 w/LBBB at that time Amiodarone  started June 2025  Studies Reviewed: SABRA    EKG not done today   06/02/24: TTE 1. There is an L wave in the mitral inflow pattern suggesting significant  diastolic dysfunction and increased filling pressures. Left ventricular  ejection fraction, by estimation, is 25 to 30%. The left ventricle has  severely decreased function. The  left ventricle demonstrates global hypokinesis. There is mild concentric  left ventricular hypertrophy. Left ventricular diastolic parameters are  consistent with Grade II diastolic dysfunction (pseudonormalization).   2. Right ventricular systolic function is mildly reduced. The right  ventricular size is normal. There is mildly elevated pulmonary artery  systolic pressure. The estimated right ventricular systolic pressure is  36.9 mmHg.   3. Left atrial size was moderately dilated.   4. The mitral valve is normal in structure. Mild mitral valve  regurgitation. No evidence of mitral stenosis.   5. The aortic valve is tricuspid. There is mild calcification of the  aortic valve. Aortic valve regurgitation is not visualized. Aortic valve  sclerosis/calcification is present, without any evidence of aortic  stenosis.   Conclusion(s)/Recommendation(s): Severe global LV dysfunction new since  previous. Doppler parameters suggestive of high left-side filling  pressures.    05/11/24: LHC   1st RPL lesion is 60% stenosed.   Ost LAD to Prox LAD lesion is  40% stenosed.   Ost LM lesion is 20% stenosed.   Non-stenotic Prox Cx to Mid Cx lesion was previously treated.    May 2025: LHC 1.  Widely patent left circumflex stent with stable moderate proximal LAD stenosis.  No evidence of obstructive disease. 2.  Left ventricular angiography was not performed due to chronic kidney disease.  Mildly elevated left ventricular  end-diastolic pressure at 20 mmHg. Recommendations: Continue medical therapy for coronary artery disease.  LHC 03/2023 1.  Widely patent left main with no significant stenosis 2.  Patent LAD with 40 to 50% proximal stenosis, nonobstructive disease appropriate for medical therapy 3.  Severe mid circumflex stenosis treated successfully with a 2.75 x 28 mm Synergy DES 4.  Large, dominant RCA with mild nonobstructive plaquing and moderate focal stenosis in a large posterolateral branch 5.  Normal LVEDP   Echo 01/2023 LVEF 55-60%, Grade 1 DD, trivial MR    09/22/14: TTE Study Conclusions - Left ventricle: The cavity size was normal. There was mild   concentric hypertrophy. Systolic function was normal. The   estimated ejection fraction was in the range of 50% to 55%. Wall   motion was normal; there were no regional wall motion   abnormalities. Doppler parameters are consistent with abnormal   left ventricular relaxation (grade 1 diastolic dysfunction). - Aortic valve: Trileaflet; normal thickness leaflets. There was no   regurgitation. - Aortic root: The aortic root was normal in size. - Mitral valve: There was mild regurgitation. - Left atrium: The atrium was normal in size. - Right ventricle: Systolic function was normal. - Right atrium: The atrium was normal in size. - Tricuspid valve: There was trivial regurgitation. - Pulmonary arteries: Systolic pressure was within the normal   range. Impressions - Normal biventricular size and systolic function. Abnormal   relaxation with normal filling pressures. Mild mitral and   tricuspid regurgitation. Normal RVSP.   Normal left atrial size.   09/23/14: Lexiscan  stress  IMPRESSION: 1. No definite reversible ischemia or infarction. 2. Normal left ventricular wall motion. 3. Left ventricular ejection fraction 55% 4. Low-risk stress test findings*.   Risk Assessment/Calculations:    Physical Exam:   VS:  There were no vitals taken  for this visit.   Wt Readings from Last 3 Encounters:  07/21/24 193 lb 3.2 oz (87.6 kg)  07/10/24 191 lb (86.6 kg)  06/26/24 189 lb (85.7 kg)    GEN: Well nourished, well developed in no acute distress NECK: No JVD; No carotid bruits CARDIAC: *** RRR, no murmurs, rubs, gallops RESPIRATORY: *** CTA b/l without rales, wheezing or rhonchi  ABDOMEN: Soft, non-tender, non-distended EXTREMITIES: *** edema b/l LE to about mid shin; No deformity   ASSESSMENT AND PLAN: .    paroxysmal AFib CHA2DS2Vasc is 6, on Eliquis , *** appropriately dosed amiodarone  ***  HTN ***  CAD Stable cath may 2025 Medical management C/w Dr. Elston  LBBB Preserved LVEF by 2024 >> June 2025 down 25-30% Cards/AHF teams managing Pending f/u echo +/- consideratio of CRT-D if not improved Though suspect maintaining SR and meds should get her LVEF up some  Secondary hypercoagulable state 2/2 AFib       Dispo: with EP again in *** , sooner if needed   Signed, Charlies Macario Arthur, PA-C

## 2024-07-27 ENCOUNTER — Encounter: Payer: Self-pay | Admitting: Physician Assistant

## 2024-07-27 ENCOUNTER — Ambulatory Visit: Attending: Physician Assistant | Admitting: Physician Assistant

## 2024-07-27 VITALS — BP 122/74 | HR 63 | Ht 64.0 in | Wt 192.7 lb

## 2024-07-27 DIAGNOSIS — I5023 Acute on chronic systolic (congestive) heart failure: Secondary | ICD-10-CM

## 2024-07-27 DIAGNOSIS — I428 Other cardiomyopathies: Secondary | ICD-10-CM

## 2024-07-27 DIAGNOSIS — I480 Paroxysmal atrial fibrillation: Secondary | ICD-10-CM | POA: Diagnosis not present

## 2024-07-27 DIAGNOSIS — D6869 Other thrombophilia: Secondary | ICD-10-CM | POA: Diagnosis not present

## 2024-07-27 NOTE — Patient Instructions (Addendum)
 Medication Instructions:    Your physician recommends that you continue on your current medications as directed. Please refer to the Current Medication list given to you today.   *If you need a refill on your cardiac medications before your next appointment, please call your pharmacy*    Lab Work: NONE ORDERED  TODAY    If you have labs (blood work) drawn today and your tests are completely normal, you will receive your results only by: MyChart Message (if you have MyChart) OR A paper copy in the mail If you have any lab test that is abnormal or we need to change your treatment, we will call you to review the results.    Testing/Procedures: NONE ORDERED  TODAY     Follow-Up: At [facility], you and your health needs are our priority.  As part of our continuing mission to provide you with exceptional heart care, our providers are all part of one team.  This team includes your primary Cardiologist (physician) and Advanced Practice Providers or APPs (Physician Assistants and Nurse Practitioners) who all work together to provide you with the care you need, when you need it.   Your next appointment:   3 month(s)  Provider:  DR CINDIE ( CONTACT  CASSIE HALL/ ANGELINE HAMMER FOR EP SCHEDULING ISSUES )   We recommend signing up for the patient portal called MyChart.  Sign up information is provided on this After Visit Summary.  MyChart is used to connect with patients for Virtual Visits (Telemedicine).  Patients are able to view lab/test results, encounter notes, upcoming appointments, etc.  Non-urgent messages can be sent to your provider as well.   To learn more about what you can do with MyChart, go to [email or web address].   Other Instructions

## 2024-08-02 ENCOUNTER — Other Ambulatory Visit (HOSPITAL_COMMUNITY)

## 2024-08-03 ENCOUNTER — Ambulatory Visit: Admitting: Physician Assistant

## 2024-08-07 ENCOUNTER — Ambulatory Visit: Attending: Physician Assistant | Admitting: Physician Assistant

## 2024-08-07 ENCOUNTER — Encounter: Payer: Self-pay | Admitting: Physician Assistant

## 2024-08-07 VITALS — BP 160/60 | HR 62 | Ht 65.0 in | Wt 196.2 lb

## 2024-08-07 DIAGNOSIS — I5020 Unspecified systolic (congestive) heart failure: Secondary | ICD-10-CM | POA: Diagnosis not present

## 2024-08-07 DIAGNOSIS — I4819 Other persistent atrial fibrillation: Secondary | ICD-10-CM | POA: Diagnosis not present

## 2024-08-07 DIAGNOSIS — N1831 Chronic kidney disease, stage 3a: Secondary | ICD-10-CM | POA: Diagnosis not present

## 2024-08-07 DIAGNOSIS — I2510 Atherosclerotic heart disease of native coronary artery without angina pectoris: Secondary | ICD-10-CM | POA: Diagnosis not present

## 2024-08-07 DIAGNOSIS — I10 Essential (primary) hypertension: Secondary | ICD-10-CM

## 2024-08-07 MED ORDER — ISOSORBIDE MONONITRATE ER 60 MG PO TB24: 60.0000 mg | ORAL_TABLET | Freq: Every day | ORAL | 3 refills | Status: AC

## 2024-08-07 NOTE — Progress Notes (Signed)
 OFFICE NOTE:    Date:  08/07/2024  ID:  [name], DOB 1946/01/21, MRN [id number] PCP: Nichole Senior, MD  [facility] Providers Cardiologist:  Peter Swaziland, MD Cardiology APP:  Lelon Glendia DASEN, PA-C  Electrophysiologist:  OLE DASEN HOLTS, MD  Electrophysiology APP:  Leverne Charlies Helling, PA-C        Patient Profile:  Paroxysmal Atrial fibrillation  Coronary artery disease TTE 02/24/2023: EF 55-60, mild LVH, inferior HK, G1 DD, mild LAE, trivial MR, AV sclerosis, RAP 3 CCTA 02/13/2023: Proximal LAD >70, proximal LCx 50-69, proximal OM1 50-69; CAC score 457 (81st percentile); FFR LAD 0.8, 0.75; FFR LCx/OM 0.76 S/p 2.75 x 28 mm DES to the mid LCx [facility] 04/23/2023: LAD ostial 50; LCx proximal 80 (PCI); RPL 160 PET MPI 05/09/2024: Anterior ischemia, EF 35, global MBF are abnormal at 1.58 LHC 05/11/2024: LCx stent patent; LAD ostial 40, LM ostial 20; RPL1 60 (HFrEF) heart failure with reduced ejection fraction  TTE 06/02/2024: EF 25-30, global HK, mild LVH, GRII DD, mild reduced RVSF, mildly elevated PASP, RVSP 36.9, moderate LAE, mild MR, AV sclerosis Left Bundle Branch Block OSA  Hypertension  Diabetes mellitus  Hypothyroidism  Rheumatoid arthritis  Asthma  Anemia  Chronic kidney disease         Discussed the use of AI scribe software for clinical note transcription with the patient, who gave verbal consent to proceed. History of Present Illness Destiny Brown is a 78 y.o. female who returns for follow-up of AF, CAD, CHF.  She was last seen 06/13/2024.  She had been admitted in 04/2024 with AF with RVR.  A recent PET MPI demonstrated anterior ischemia.  Cardiac cath demonstrated patent LCx stent and no significant disease elsewhere.  She was readmitted with decompensated heart failure in 05/2024.  She had recurrent AF with RVR and was placed on amiodarone  restoring NSR.  When I last saw her, I placed her on Jardiance .  Of note, GDMT is limited by CKD.  She was seen by  Caffie Shed, PA-C in the CHF clinic in 05/2024.  Hydralazine , isosorbide  was started.  Repeat echo was recommended in 3 months.  CMR should be considered if EF does not recover.  Also, CRT should be considered given history of LBBB.  She was last seen in the CHF clinic 07/21/2024.  TSH was mildly elevated at 6.99.  Free T4 was normal at 1.67.  LFTs were normal.  She was last seen in clinic with Dr. Swaziland 07/21/2024 as a DOD add-on.  She also saw Charlies Leverne, PA-C for EP 7/31.  Of note, her follow-up echo is scheduled 09/11/2024.  She experiences shortness of breath with exertion and cannot climb a full flight of stairs without stopping. She has completed physical therapy and is trying to increase her activity level.  She notes some weight fluctuations.  She brings in a list of her weights and they range anywhere from 184-188.  She takes furosemide  as needed, but has only taken it two or three times since her hospitalization.  She has not had chest discomfort, pain, pressure, tightness, syncope, or any issues with bleeding.    ROS-See HPI    Studies Reviewed:      Results  Risk Assessment/Calculations:  CHA2DS2-VASc Score = 7   This indicates a 11.2% annual risk of stroke. The patient's score is based upon: CHF History: 1 HTN History: 1 Diabetes History: 1 Stroke History: 0 Vascular Disease History: 1 Age Score: 2 Gender  Score: 1    HYPERTENSION CONTROL Vitals:   08/07/24 1416 08/07/24 1541  BP: (!) 148/58 (!) 160/60    The patient's blood pressure is elevated above target today.  In order to address the patient's elevated BP: A current anti-hypertensive medication was adjusted today.         Physical Exam:  VS:  BP (!) 160/60   Pulse 62   Ht 5' 5 (1.651 m)   Wt 196 lb 3.2 oz (89 kg)   SpO2 98%   BMI 32.65 kg/m        Wt Readings from Last 3 Encounters:  08/07/24 196 lb 3.2 oz (89 kg)  07/27/24 192 lb 11.2 oz (87.4 kg)  07/21/24 193 lb 3.2 oz (87.6 kg)     Constitutional:      Appearance: Healthy appearance. Not in distress.  Neck:     Vascular: No JVR. JVD normal.  Pulmonary:     Breath sounds: Normal breath sounds. No wheezing. No rales.  Cardiovascular:     Normal rate. Regular rhythm.     Murmurs: There is no murmur.  Edema:    Peripheral edema present.    Ankle: bilateral trace edema of the ankle. Abdominal:     Palpations: Abdomen is soft.       Assessment and Plan:    Assessment & Plan HFrEF (heart failure with reduced ejection fraction) (HCC) Admitted in June 2025 with acute CHF in the setting of AF with RVR.  Her EF was down to 25-30 at that point.  GDMT has been titrated since that time.  She is not on ACE/ARB/ARNI/MRA secondary to chronic kidney disease.  She is not on beta-blocker secondary to bradycardia.  She is now on hydralazine , nitrates, SGLT2 inhibitor.  Overall volume status appears to be stable.  She is NYHA IIb-III.  I suspect some of her shortness of breath is related to deconditioning.  Blood pressure still somewhat above target. -She has a follow-up echo scheduled for next month to reassess EF -If EF remains <35, we will need to consider CRT +/- ICD -Increase isosorbide  mononitrate to 60 mg daily -Continue hydralazine  50 mg 3 times a day, Jardiance  10 mg daily -Keep follow-up with Dr. Swaziland and Dr. Cindie as planned Coronary artery disease involving native coronary artery of native heart without angina pectoris Status post drug-eluting stent (DES) to mid left circumflex artery (LCX) in April 2024. Cardiac catheterization in May 2025 showed patent LCX stent and mild to moderate non-obstructive disease elsewhere.  She is doing well without chest discomfort to suggest angina.  She is not on antiplatelet therapy as she is on anticoagulation. -Continue rosuvastatin  5 mg twice weekly Persistent atrial fibrillation (HCC) Status post cardioversion in May 2025. Recurrent admission in June 2025 with AFib with RVR and  acute congestive heart failure (CHF). Converted to sinus rhythm on amiodarone .  Maintaining sinus rhythm on exam.  Recent TSH elevated but T4 normal.  LFTs were normal. -Continue amiodarone  20 mg daily, Eliquis  5 mg twice daily -Follow-up with EP as planned Stage 3a chronic kidney disease (HCC) Recent creatinine 07/10/2024 was 2.03 (stable). Primary hypertension Blood pressure above target.  I reviewed blood pressure readings from home.  Her systolic is around 140 most of the time.  It tends to go up higher in clinic. -Increase isosorbide  mononitrate to 60 mg daily -Continue hydralazine  50 mg 3 times a day -Continue amlodipine  5 mg daily         Dispo:  Return  for Scheduled Follow Up.  Signed, Glendia Ferrier, PA-C

## 2024-08-07 NOTE — Assessment & Plan Note (Addendum)
 Status post cardioversion in May 2025. Recurrent admission in June 2025 with AFib with RVR and acute congestive heart failure (CHF). Converted to sinus rhythm on amiodarone .  Maintaining sinus rhythm on exam.  Recent TSH elevated but T4 normal.  LFTs were normal. -Continue amiodarone  20 mg daily, Eliquis  5 mg twice daily -Follow-up with EP as planned

## 2024-08-07 NOTE — Patient Instructions (Signed)
 Medication Instructions:  Your physician has recommended you make the following change in your medication:   INCREASE Imdur  to 60 mg taking 1 daily  *If you need a refill on your cardiac medications before your next appointment, please call your pharmacy*  Lab Work: None ordered  If you have labs (blood work) drawn today and your tests are completely normal, you will receive your results only by: MyChart Message (if you have MyChart) OR A paper copy in the mail If you have any lab test that is abnormal or we need to change your treatment, we will call you to review the results.  Testing/Procedures: None ordered  Follow-Up: At [facility], you and your health needs are our priority.  As part of our continuing mission to provide you with exceptional heart care, our providers are all part of one team.  This team includes your primary Cardiologist (physician) and Advanced Practice Providers or APPs (Physician Assistants and Nurse Practitioners) who all work together to provide you with the care you need, when you need it.  Your next appointment:   As scheduled   Provider:   Peter Swaziland, MD    We recommend signing up for the patient portal called MyChart.  Sign up information is provided on this After Visit Summary.  MyChart is used to connect with patients for Virtual Visits (Telemedicine).  Patients are able to view lab/test results, encounter notes, upcoming appointments, etc.  Non-urgent messages can be sent to your provider as well.   To learn more about what you can do with MyChart, go to [email or web address].   Other Instructions

## 2024-08-07 NOTE — Assessment & Plan Note (Addendum)
 Status post drug-eluting stent (DES) to mid left circumflex artery (LCX) in April 2024. Cardiac catheterization in May 2025 showed patent LCX stent and mild to moderate non-obstructive disease elsewhere.  She is doing well without chest discomfort to suggest angina.  She is not on antiplatelet therapy as she is on anticoagulation. -Continue rosuvastatin  5 mg twice weekly

## 2024-08-07 NOTE — Assessment & Plan Note (Signed)
 Blood pressure above target.  I reviewed blood pressure readings from home.  Her systolic is around 140 most of the time.  It tends to go up higher in clinic. -Increase isosorbide  mononitrate to 60 mg daily -Continue hydralazine  50 mg 3 times a day -Continue amlodipine  5 mg daily

## 2024-08-22 ENCOUNTER — Other Ambulatory Visit (HOSPITAL_COMMUNITY): Payer: Self-pay

## 2024-08-23 ENCOUNTER — Other Ambulatory Visit: Payer: Self-pay | Admitting: Pulmonary Disease

## 2024-09-11 ENCOUNTER — Ambulatory Visit (HOSPITAL_COMMUNITY)
Admission: RE | Admit: 2024-09-11 | Discharge: 2024-09-11 | Disposition: A | Discharge status: Home / Self Care | Source: Ambulatory Visit | Attending: Cardiology | Admitting: Cardiology

## 2024-09-11 ENCOUNTER — Other Ambulatory Visit (HOSPITAL_COMMUNITY)

## 2024-09-11 DIAGNOSIS — I25119 Atherosclerotic heart disease of native coronary artery with unspecified angina pectoris: Secondary | ICD-10-CM | POA: Diagnosis present

## 2024-09-11 DIAGNOSIS — I480 Paroxysmal atrial fibrillation: Secondary | ICD-10-CM | POA: Insufficient documentation

## 2024-09-11 DIAGNOSIS — I5022 Chronic systolic (congestive) heart failure: Secondary | ICD-10-CM | POA: Insufficient documentation

## 2024-09-11 DIAGNOSIS — I10 Essential (primary) hypertension: Secondary | ICD-10-CM | POA: Insufficient documentation

## 2024-09-11 DIAGNOSIS — R002 Palpitations: Secondary | ICD-10-CM | POA: Insufficient documentation

## 2024-09-11 DIAGNOSIS — R7989 Other specified abnormal findings of blood chemistry: Secondary | ICD-10-CM | POA: Diagnosis present

## 2024-09-11 DIAGNOSIS — I2510 Atherosclerotic heart disease of native coronary artery without angina pectoris: Secondary | ICD-10-CM | POA: Insufficient documentation

## 2024-09-11 LAB — ECHOCARDIOGRAM COMPLETE
Area-P 1/2: 3.81 cm2
S' Lateral: 3.7 cm

## 2024-09-25 ENCOUNTER — Encounter: Payer: Self-pay | Admitting: Cardiology

## 2024-09-28 ENCOUNTER — Telehealth (HOSPITAL_COMMUNITY): Payer: Self-pay

## 2024-09-28 NOTE — Telephone Encounter (Signed)
 [facility] front office personnel called [facility], [place] at [phone] to inform them of the referral update for this pt that was received via the fax machine on 09/05/24.  Referred by Garnette Ore (provider) with [facility] - - PCP per pt's chart.   Front office personnel spoke to [employer], [place] telephone operator and asked to speak to the referral coordinator in the office. [facility] front office was told that the referral coordinator was named Particia / Nena?   Was transferred to [facility] (referral coordinator) telephone line and the call went to voice mail - - left [facility] referral coordinator a voice message.   Stated per the [facility] clinical staff and specialty coordinators that this pt is not appropriate for our clinic and needs to follow up with Dr. Peter Swaziland (with [facility] office).   Stated if [facility], [place] has any questions to call our office back.

## 2024-10-02 ENCOUNTER — Telehealth: Payer: Self-pay | Admitting: Cardiology

## 2024-10-02 NOTE — Telephone Encounter (Signed)
 Patient identification verified by 2 forms.   Called and spoke to patient  Patient states:  -Keeping track of weight, pulse, and oxygen.  -Out of town Thursday, I would like to feel better before than.  -HR might go up to 50 or 60 but no higher.  -Pt reports lightheadedness and instability most of the time right now -Pt has fallen once 5 days ago, she reports a knot on her head. She applied ice and it decreased.   Patient denies:  -Pt denies getting examined after the fall.               Interventions/Plan: -Pt will send a [employer] reading today .   Reviewed ED warning signs/precautions  Patient agrees with plan, no questions at this time

## 2024-10-02 NOTE — Telephone Encounter (Signed)
 Left message for patient that we will be back in touch once dr cindie gives his recommendations.

## 2024-10-02 NOTE — Telephone Encounter (Signed)
 STAT if HR is under 50 or over 120  (normal HR is 60-100 beats per minute)  What is your heart rate? 48  Do you have a log of your heart rate readings (document readings)? 40's  Do you have any other symptoms? Pt states she is tired and worn out    Pt is checking HR with BP cuff and pulse Ox

## 2024-10-04 NOTE — Telephone Encounter (Signed)
 Per phone encounter Dr. Swaziland is waiting for Dr. Ramona input. Will forward to Dr. Cindie.

## 2024-10-09 ENCOUNTER — Telehealth: Payer: Self-pay | Admitting: Cardiology

## 2024-10-09 NOTE — Telephone Encounter (Signed)
 See above phone note. Patient is coming in tomorrow 10/14 for an EKG.

## 2024-10-09 NOTE — Telephone Encounter (Signed)
 Spoke with Destiny Brown this afternoon.  She is doing okay.  No presyncope or syncope.  Reports fatigue associated with a low heart rate.  Heart rates have been in the 40s at home and will increase into the 50s with minimal exertion.  I have recommended a twelve-lead EKG to be done in clinic.  If the EKG shows AV block, plan for permanent pacing.  If sinus bradycardia, can likely decrease amiodarone  to 100 mg by mouth once daily.  Plan discussed with the patient who is in agreement.  Our office will reach out to the patient this afternoon to discuss EKG/nurse visit.   Ole Holts, MD, [facility], [facility] Cardiac Electrophysiology

## 2024-10-09 NOTE — Telephone Encounter (Signed)
 Spoke with the patient and have scheduled her for an EKG tomorrow 10/14.

## 2024-10-09 NOTE — Telephone Encounter (Addendum)
 Patient is coming in tomorrow 10/14 for an EKG.

## 2024-10-10 ENCOUNTER — Ambulatory Visit: Attending: Cardiology

## 2024-10-10 VITALS — HR 46 | Ht 65.0 in | Wt 190.0 lb

## 2024-10-10 DIAGNOSIS — I480 Paroxysmal atrial fibrillation: Secondary | ICD-10-CM | POA: Diagnosis not present

## 2024-10-10 DIAGNOSIS — I447 Left bundle-branch block, unspecified: Secondary | ICD-10-CM | POA: Diagnosis not present

## 2024-10-10 MED ORDER — AMIODARONE HCL 100 MG PO TABS: 100.0000 mg | ORAL_TABLET | Freq: Every day | ORAL | 3 refills | Status: AC

## 2024-10-10 NOTE — Progress Notes (Signed)
   Nurse Visit   Date of Encounter: 10/10/2024 ID: Destiny Brown, DOB 02-07-1946, MRN [id number]  PCP:  Nichole Senior, MD   [facility] Providers Cardiologist:  Peter Swaziland, MD Cardiology APP:  Lelon Glendia DASEN, PA-C  Electrophysiologist:  OLE DASEN HOLTS, MD  Electrophysiology APP:  Leverne Charlies Helling, PA-C      Visit Details   VS:  Pulse (!) 46   Ht 5' 5 (1.651 m)   Wt 190 lb (86.2 kg)   BMI 31.62 kg/m  , BMI Body mass index is 31.62 kg/m.  Wt Readings from Last 3 Encounters:  10/10/24 190 lb (86.2 kg)  08/07/24 196 lb 3.2 oz (89 kg)  07/27/24 192 lb 11.2 oz (87.4 kg)     Reason for visit: EKG Performed today: EKG, Provider consulted:Dr. HOLTS, and Education Changes (medications, testing, etc.) : Decrease amiodarone  to 100 mg once daily Length of Visit: 15 minutes    Medications Adjustments/Labs and Tests Ordered: Orders Placed This Encounter  Procedures   EKG 12-Lead   Meds ordered this encounter  Medications   amiodarone  (PACERONE ) 100 MG tablet    Sig: Take 1 tablet (100 mg total) by mouth daily.    Dispense:  90 tablet    Refill:  3     Signed, Kelty Szafran Chauvigne, RN  10/10/2024 8:14 AM

## 2024-10-10 NOTE — Patient Instructions (Addendum)
 Medication Instructions:  Your physician has recommended you make the following change in your medication:   1) DECREASE amiodarone  to 100 mg once daily  *If you need a refill on your cardiac medications before your next appointment, please call your pharmacy*  Follow-Up: At [facility], you and your health needs are our priority.  As part of our continuing mission to provide you with exceptional heart care, our providers are all part of one team.  This team includes your primary Cardiologist (physician) and Advanced Practice Providers or APPs (Physician Assistants and Nurse Practitioners) who all work together to provide you with the care you need, when you need it.  Your next appointment:   10/30 at 8:30am  Provider:   Ole Holts, MD

## 2024-10-19 LAB — LAB REPORT - SCANNED
Albumin, Urine POC: 70.5
Creatinine, POC: 63.9 mg/dL
EGFR: 25
Microalb Creat Ratio: 110

## 2024-10-23 ENCOUNTER — Telehealth (HOSPITAL_COMMUNITY): Payer: Self-pay

## 2024-10-23 ENCOUNTER — Other Ambulatory Visit (HOSPITAL_COMMUNITY): Payer: Self-pay | Admitting: Nephrology

## 2024-10-23 DIAGNOSIS — D631 Anemia in chronic kidney disease: Secondary | ICD-10-CM | POA: Insufficient documentation

## 2024-10-23 NOTE — Telephone Encounter (Signed)
 Auth Submission: NO AUTH NEEDED Site of care: Site of care: [facility] Payer: [employer] Medicare Medication & CPT/J Code(s) submitted: Feraheme (ferumoxytol) [id number] Diagnosis Code: N18.9/D63.1 Route of submission (phone, fax, portal): portal Phone # Fax # Auth type: Buy/Bill HB Units/visits requested: 510mg  x 2 doses Reference number: [id number] Approval from: 10/23/24 to 12/27/24

## 2024-10-25 NOTE — Progress Notes (Unsigned)
  Electrophysiology Office Follow up Visit Note:    Date:  10/26/2024   ID:  Destiny Brown, DOB 1946-04-13, MRN [id number]  PCP:  Nichole Senior, MD  [facility] Cardiologist:  Peter Jordan, MD  [facility] Electrophysiologist:  OLE ONEIDA HOLTS, MD    Interval History:     Destiny Brown is a 78 y.o. female who presents for a follow up visit.   The patient was seen by [facility] July 27, 2024.  She has a history of paroxysmal atrial fibrillation on Eliquis .  Also has a left bundle branch block and was planning to have a repeat echo after her appointment with Renee. Today she is with her husband in clinic.  She tells me that she is feeling better.  No known A-fib recurrence.  Is tolerating her medications without off target effects.  Reports her heart rates are typically in the 50s and 60s.       Past medical, surgical, social and family history were reviewed.  ROS:   Please see the history of present illness.    All other systems reviewed and are negative.  EKGs/Labs/Other Studies Reviewed:    The following studies were reviewed today:  September 11, 2024 echo EF 50 to 55% RV normal Moderately dilated left atrium Trivial MR   June 02, 2024 echo EF 25 to 30% RV mildly reduced Mild MR  EKG Interpretation Date/Time:  Thursday October 26 2024 08:38:47 EDT Ventricular Rate:  51 PR Interval:  156 QRS Duration:  154 QT Interval:  530 QTC Calculation: 488 R Axis:   -66  Text Interpretation: Sinus bradycardia Left axis deviation Left bundle branch block Confirmed by Holts Ole [phone]) on 10/26/2024 8:41:49 AM    Physical Exam:    VS:  BP (!) 104/50 (BP Location: Left Arm, Patient Position: Sitting, Cuff Size: Large)   Pulse (!) 51   Ht 5' 5 (1.651 m)   Wt 197 lb (89.4 kg)   SpO2 96%   BMI 32.78 kg/m     Wt Readings from Last 3 Encounters:  10/26/24 197 lb (89.4 kg)  10/10/24 190 lb (86.2 kg)  08/07/24 196 lb 3.2 oz (89 kg)     GEN: no  distress CARD: RRR, No MRG RESP: No IWOB. CTAB.      ASSESSMENT:    1. LBBB (left bundle branch block)   2. Chronic systolic heart failure (HCC)   3. Coronary artery disease involving native coronary artery of native heart without angina pectoris   4. Persistent atrial fibrillation (HCC)   5. Encounter for long-term (current) use of high-risk medication    PLAN:    In order of problems listed above:  #Persistent atrial fibrillation #High risk med monitoring-amiodarone  The patient is maintaining sinus rhythm.  Dose of amiodarone  recently reduced to 100 mg by mouth once daily to help reduce bradycardia On Eliquis  for stroke prophylaxis  #Chronic systolic heart failure #Left bundle branch block EF is now improved to 50 to 55%.  CRT-D is not indicated. NYHA class II symptoms Continue GDMT, Imdur , hydralazine , Lasix , Jardiance   I discussed my upcoming departure from [facility] during today's clinic appointment.  She will transition her care to one of my partners, Dr. Almetta.  Follow-up 6 months with APP.  Blood work at that appointment.   Signed, Ole Holts, MD, [facility], [facility] 10/26/2024 8:52 AM    Electrophysiology [facility]

## 2024-10-26 ENCOUNTER — Ambulatory Visit: Attending: Internal Medicine | Admitting: Cardiology

## 2024-10-26 ENCOUNTER — Encounter: Payer: Self-pay | Admitting: Cardiology

## 2024-10-26 VITALS — BP 104/50 | HR 51 | Ht 65.0 in | Wt 197.0 lb

## 2024-10-26 DIAGNOSIS — I2510 Atherosclerotic heart disease of native coronary artery without angina pectoris: Secondary | ICD-10-CM

## 2024-10-26 DIAGNOSIS — I4819 Other persistent atrial fibrillation: Secondary | ICD-10-CM

## 2024-10-26 DIAGNOSIS — I5022 Chronic systolic (congestive) heart failure: Secondary | ICD-10-CM | POA: Diagnosis not present

## 2024-10-26 DIAGNOSIS — I447 Left bundle-branch block, unspecified: Secondary | ICD-10-CM

## 2024-10-26 DIAGNOSIS — Z79899 Other long term (current) drug therapy: Secondary | ICD-10-CM

## 2024-10-26 NOTE — Patient Instructions (Signed)
 Medication Instructions:  Your physician recommends that you continue on your current medications as directed. Please refer to the Current Medication list given to you today.  *If you need a refill on your cardiac medications before your next appointment, please call your pharmacy*  Follow-Up: At [facility], you and your health needs are our priority.  As part of our continuing mission to provide you with exceptional heart care, our providers are all part of one team.  This team includes your primary Cardiologist (physician) and Advanced Practice Providers or APPs (Physician Assistants and Nurse Practitioners) who all work together to provide you with the care you need, when you need it.  Your next appointment:   6 month(s)  Provider:   Charlies Arthur, PA-C

## 2024-11-06 ENCOUNTER — Other Ambulatory Visit: Payer: Self-pay

## 2024-11-06 ENCOUNTER — Encounter (HOSPITAL_COMMUNITY): Payer: Self-pay

## 2024-11-06 ENCOUNTER — Emergency Department (HOSPITAL_COMMUNITY)

## 2024-11-06 ENCOUNTER — Inpatient Hospital Stay (HOSPITAL_COMMUNITY)
Admission: EM | Admit: 2024-11-06 | Discharge: 2024-11-09 | DRG: 193 | Disposition: A | Discharge status: Hospice | Attending: Internal Medicine | Admitting: Internal Medicine

## 2024-11-06 ENCOUNTER — Telehealth (HOSPITAL_COMMUNITY): Payer: Self-pay | Admitting: *Deleted

## 2024-11-06 DIAGNOSIS — J9601 Acute respiratory failure with hypoxia: Secondary | ICD-10-CM | POA: Diagnosis present

## 2024-11-06 DIAGNOSIS — I130 Hypertensive heart and chronic kidney disease with heart failure and stage 1 through stage 4 chronic kidney disease, or unspecified chronic kidney disease: Secondary | ICD-10-CM | POA: Diagnosis present

## 2024-11-06 DIAGNOSIS — R0902 Hypoxemia: Secondary | ICD-10-CM | POA: Diagnosis not present

## 2024-11-06 DIAGNOSIS — Z79890 Hormone replacement therapy (postmenopausal): Secondary | ICD-10-CM

## 2024-11-06 DIAGNOSIS — Z96651 Presence of right artificial knee joint: Secondary | ICD-10-CM | POA: Diagnosis present

## 2024-11-06 DIAGNOSIS — Z79899 Other long term (current) drug therapy: Secondary | ICD-10-CM

## 2024-11-06 DIAGNOSIS — I959 Hypotension, unspecified: Secondary | ICD-10-CM | POA: Diagnosis not present

## 2024-11-06 DIAGNOSIS — E871 Hypo-osmolality and hyponatremia: Secondary | ICD-10-CM | POA: Diagnosis present

## 2024-11-06 DIAGNOSIS — I5033 Acute on chronic diastolic (congestive) heart failure: Secondary | ICD-10-CM | POA: Diagnosis present

## 2024-11-06 DIAGNOSIS — Z885 Allergy status to narcotic agent status: Secondary | ICD-10-CM

## 2024-11-06 DIAGNOSIS — Z96611 Presence of right artificial shoulder joint: Secondary | ICD-10-CM | POA: Diagnosis present

## 2024-11-06 DIAGNOSIS — T502X5A Adverse effect of carbonic-anhydrase inhibitors, benzothiadiazides and other diuretics, initial encounter: Secondary | ICD-10-CM | POA: Diagnosis present

## 2024-11-06 DIAGNOSIS — I4891 Unspecified atrial fibrillation: Secondary | ICD-10-CM | POA: Diagnosis present

## 2024-11-06 DIAGNOSIS — Z9049 Acquired absence of other specified parts of digestive tract: Secondary | ICD-10-CM | POA: Diagnosis not present

## 2024-11-06 DIAGNOSIS — E039 Hypothyroidism, unspecified: Secondary | ICD-10-CM | POA: Diagnosis present

## 2024-11-06 DIAGNOSIS — Z7984 Long term (current) use of oral hypoglycemic drugs: Secondary | ICD-10-CM | POA: Diagnosis not present

## 2024-11-06 DIAGNOSIS — J189 Pneumonia, unspecified organism: Secondary | ICD-10-CM | POA: Diagnosis not present

## 2024-11-06 DIAGNOSIS — N184 Chronic kidney disease, stage 4 (severe): Secondary | ICD-10-CM | POA: Diagnosis present

## 2024-11-06 DIAGNOSIS — Z90710 Acquired absence of both cervix and uterus: Secondary | ICD-10-CM

## 2024-11-06 DIAGNOSIS — N179 Acute kidney failure, unspecified: Secondary | ICD-10-CM | POA: Diagnosis present

## 2024-11-06 DIAGNOSIS — Z743 Need for continuous supervision: Secondary | ICD-10-CM | POA: Diagnosis not present

## 2024-11-06 DIAGNOSIS — E1122 Type 2 diabetes mellitus with diabetic chronic kidney disease: Secondary | ICD-10-CM | POA: Diagnosis present

## 2024-11-06 DIAGNOSIS — Z1152 Encounter for screening for COVID-19: Secondary | ICD-10-CM

## 2024-11-06 DIAGNOSIS — R0600 Dyspnea, unspecified: Principal | ICD-10-CM

## 2024-11-06 DIAGNOSIS — G4733 Obstructive sleep apnea (adult) (pediatric): Secondary | ICD-10-CM | POA: Diagnosis present

## 2024-11-06 DIAGNOSIS — R059 Cough, unspecified: Secondary | ICD-10-CM | POA: Diagnosis not present

## 2024-11-06 DIAGNOSIS — Z7901 Long term (current) use of anticoagulants: Secondary | ICD-10-CM

## 2024-11-06 LAB — I-STAT VENOUS BLOOD GAS, ED
Acid-base deficit: 6 mmol/L — ABNORMAL HIGH (ref 0.0–2.0)
Bicarbonate: 18.8 mmol/L — ABNORMAL LOW (ref 20.0–28.0)
Calcium, Ion: 1.23 mmol/L (ref 1.15–1.40)
HCT: 34 % — ABNORMAL LOW (ref 36.0–46.0)
Hemoglobin: 11.6 g/dL — ABNORMAL LOW (ref 12.0–15.0)
O2 Saturation: 88 %
Potassium: 4 mmol/L (ref 3.5–5.1)
Sodium: 128 mmol/L — ABNORMAL LOW (ref 135–145)
TCO2: 20 mmol/L — ABNORMAL LOW (ref 22–32)
pCO2, Ven: 32.5 mmHg — ABNORMAL LOW (ref 44–60)
pH, Ven: 7.37 (ref 7.25–7.43)
pO2, Ven: 56 mmHg — ABNORMAL HIGH (ref 32–45)

## 2024-11-06 LAB — CBC WITH DIFFERENTIAL/PLATELET
Abs Immature Granulocytes: 0.04 K/uL (ref 0.00–0.07)
Basophils Absolute: 0 K/uL (ref 0.0–0.1)
Basophils Relative: 1 %
Eosinophils Absolute: 0.2 K/uL (ref 0.0–0.5)
Eosinophils Relative: 2 %
HCT: 34.2 % — ABNORMAL LOW (ref 36.0–46.0)
Hemoglobin: 10.8 g/dL — ABNORMAL LOW (ref 12.0–15.0)
Immature Granulocytes: 1 %
Lymphocytes Relative: 8 %
Lymphs Abs: 0.6 K/uL — ABNORMAL LOW (ref 0.7–4.0)
MCH: 25.6 pg — ABNORMAL LOW (ref 26.0–34.0)
MCHC: 31.6 g/dL (ref 30.0–36.0)
MCV: 81 fL (ref 80.0–100.0)
Monocytes Absolute: 0.7 K/uL (ref 0.1–1.0)
Monocytes Relative: 10 %
Neutro Abs: 5.7 K/uL (ref 1.7–7.7)
Neutrophils Relative %: 78 %
Platelets: 178 K/uL (ref 150–400)
RBC: 4.22 MIL/uL (ref 3.87–5.11)
RDW: 14.7 % (ref 11.5–15.5)
WBC: 7.3 K/uL (ref 4.0–10.5)
nRBC: 0 % (ref 0.0–0.2)

## 2024-11-06 LAB — URINALYSIS, W/ REFLEX TO CULTURE (INFECTION SUSPECTED)
Bacteria, UA: NONE SEEN
Bilirubin Urine: NEGATIVE
Glucose, UA: >=500 mg/dL — AB
Ketones, ur: NEGATIVE mg/dL
Leukocytes,Ua: NEGATIVE
Nitrite: NEGATIVE
Protein, ur: 100 mg/dL — AB
Specific Gravity, Urine: 1.009 (ref 1.005–1.030)
pH: 5 (ref 5.0–8.0)

## 2024-11-06 LAB — I-STAT CHEM 8, ED
BUN: 21 mg/dL (ref 8–23)
Calcium, Ion: 1.19 mmol/L (ref 1.15–1.40)
Chloride: 98 mmol/L (ref 98–111)
Creatinine, Ser: 1.9 mg/dL — ABNORMAL HIGH (ref 0.44–1.00)
Glucose, Bld: 161 mg/dL — ABNORMAL HIGH (ref 70–99)
HCT: 35 % — ABNORMAL LOW (ref 36.0–46.0)
Hemoglobin: 11.9 g/dL — ABNORMAL LOW (ref 12.0–15.0)
Potassium: 4 mmol/L (ref 3.5–5.1)
Sodium: 128 mmol/L — ABNORMAL LOW (ref 135–145)
TCO2: 19 mmol/L — ABNORMAL LOW (ref 22–32)

## 2024-11-06 LAB — COMPREHENSIVE METABOLIC PANEL WITH GFR
ALT: 16 U/L (ref 0–44)
AST: 28 U/L (ref 15–41)
Albumin: 3.9 g/dL (ref 3.5–5.0)
Alkaline Phosphatase: 80 U/L (ref 38–126)
Anion gap: 15 (ref 5–15)
BUN: 20 mg/dL (ref 8–23)
CO2: 18 mmol/L — ABNORMAL LOW (ref 22–32)
Calcium: 9.6 mg/dL (ref 8.9–10.3)
Chloride: 96 mmol/L — ABNORMAL LOW (ref 98–111)
Creatinine, Ser: 1.82 mg/dL — ABNORMAL HIGH (ref 0.44–1.00)
GFR, Estimated: 28 mL/min — ABNORMAL LOW (ref >60)
Glucose, Bld: 149 mg/dL — ABNORMAL HIGH (ref 70–99)
Potassium: 4 mmol/L (ref 3.5–5.1)
Sodium: 129 mmol/L — ABNORMAL LOW (ref 135–145)
Total Bilirubin: 1.1 mg/dL (ref 0.0–1.2)
Total Protein: 7.7 g/dL (ref 6.5–8.1)

## 2024-11-06 LAB — BRAIN NATRIURETIC PEPTIDE: B Natriuretic Peptide: 330.2 pg/mL — ABNORMAL HIGH (ref 0.0–100.0)

## 2024-11-06 LAB — PROCALCITONIN: Procalcitonin: <0.1 ng/mL

## 2024-11-06 LAB — RESP PANEL BY RT-PCR (RSV, FLU A&B, COVID)  RVPGX2
Influenza A by PCR: NEGATIVE
Influenza B by PCR: NEGATIVE
Resp Syncytial Virus by PCR: NEGATIVE
SARS Coronavirus 2 by RT PCR: NEGATIVE

## 2024-11-06 LAB — I-STAT CG4 LACTIC ACID, ED
Lactic Acid, Venous: 1.2 mmol/L (ref 0.5–1.9)
Lactic Acid, Venous: 1.1 mmol/L (ref 0.5–1.9)

## 2024-11-06 LAB — TROPONIN I (HIGH SENSITIVITY)
Troponin I (High Sensitivity): 19 ng/L — ABNORMAL HIGH (ref <18)
Troponin I (High Sensitivity): 19 ng/L — ABNORMAL HIGH (ref <18)

## 2024-11-06 MED ORDER — SODIUM CHLORIDE 0.9 % IV SOLN: 1.0000 g | Freq: Once | INTRAVENOUS | Status: DC

## 2024-11-06 MED ORDER — IPRATROPIUM-ALBUTEROL 0.5-2.5 (3) MG/3ML IN SOLN
3.0000 mL | Freq: Four times a day (QID) | RESPIRATORY_TRACT | Status: DC | PRN
  Administered 2024-11-06 – 2024-11-08 (×6): 3 mL via RESPIRATORY_TRACT
  Filled 2024-11-06 (×6): qty 3

## 2024-11-06 MED ORDER — ONDANSETRON HCL 4 MG/2ML IJ SOLN
4.0000 mg | Freq: Four times a day (QID) | INTRAMUSCULAR | Status: DC | PRN
  Administered 2024-11-06: 4 mg via INTRAVENOUS
  Filled 2024-11-06: qty 2

## 2024-11-06 MED ORDER — SODIUM CHLORIDE 0.9 % IV SOLN
2.0000 g | Freq: Once | INTRAVENOUS | Status: AC
  Administered 2024-11-06: 2 g via INTRAVENOUS
  Filled 2024-11-06: qty 20

## 2024-11-06 MED ORDER — AZITHROMYCIN 250 MG PO TABS
500.0000 mg | ORAL_TABLET | Freq: Every day | ORAL | Status: AC
  Administered 2024-11-07 – 2024-11-08 (×2): 500 mg via ORAL
  Filled 2024-11-06 (×2): qty 2

## 2024-11-06 MED ORDER — AMLODIPINE BESYLATE 10 MG PO TABS
5.0000 mg | ORAL_TABLET | Freq: Every day | ORAL | Status: DC
  Administered 2024-11-06 – 2024-11-09 (×4): 5 mg via ORAL
  Filled 2024-11-06 (×4): qty 1

## 2024-11-06 MED ORDER — LEVOTHYROXINE SODIUM 25 MCG PO TABS: 137.0000 ug | ORAL_TABLET | Freq: Every morning | ORAL | Status: DC

## 2024-11-06 MED ORDER — ISOSORBIDE MONONITRATE ER 60 MG PO TB24
60.0000 mg | ORAL_TABLET | Freq: Every day | ORAL | Status: DC
  Administered 2024-11-06 – 2024-11-09 (×4): 60 mg via ORAL
  Filled 2024-11-06: qty 1
  Filled 2024-11-06: qty 2
  Filled 2024-11-06 (×2): qty 1

## 2024-11-06 MED ORDER — SODIUM CHLORIDE 0.9 % IV SOLN
500.0000 mg | Freq: Once | INTRAVENOUS | Status: AC
  Administered 2024-11-06: 500 mg via INTRAVENOUS
  Filled 2024-11-06: qty 5

## 2024-11-06 MED ORDER — FUROSEMIDE 10 MG/ML IJ SOLN
40.0000 mg | Freq: Two times a day (BID) | INTRAMUSCULAR | Status: AC
  Administered 2024-11-06 – 2024-11-07 (×3): 40 mg via INTRAVENOUS
  Filled 2024-11-06 (×3): qty 4

## 2024-11-06 MED ORDER — SODIUM CHLORIDE 0.9 % IV SOLN
2.0000 g | INTRAVENOUS | Status: DC
  Administered 2024-11-07 – 2024-11-09 (×3): 2 g via INTRAVENOUS
  Filled 2024-11-06 (×3): qty 20

## 2024-11-06 MED ORDER — ROSUVASTATIN CALCIUM 5 MG PO TABS
5.0000 mg | ORAL_TABLET | ORAL | Status: DC
  Administered 2024-11-09: 5 mg via ORAL
  Filled 2024-11-06 (×2): qty 1

## 2024-11-06 MED ORDER — LEVOTHYROXINE SODIUM 100 MCG PO TABS
100.0000 ug | ORAL_TABLET | Freq: Every day | ORAL | Status: DC
  Administered 2024-11-07: 100 ug via ORAL
  Filled 2024-11-06: qty 1

## 2024-11-06 MED ORDER — PREGABALIN 50 MG PO CAPS
50.0000 mg | ORAL_CAPSULE | Freq: Every day | ORAL | Status: DC
  Administered 2024-11-06 – 2024-11-09 (×4): 50 mg via ORAL
  Filled 2024-11-06 (×3): qty 1
  Filled 2024-11-06: qty 2

## 2024-11-06 MED ORDER — AMIODARONE HCL 100 MG PO TABS
100.0000 mg | ORAL_TABLET | Freq: Every day | ORAL | Status: DC
  Administered 2024-11-06 – 2024-11-09 (×4): 100 mg via ORAL
  Filled 2024-11-06 (×4): qty 1

## 2024-11-06 MED ORDER — ENOXAPARIN SODIUM 40 MG/0.4ML IJ SOSY: 40.0000 mg | PREFILLED_SYRINGE | INTRAMUSCULAR | Status: DC

## 2024-11-06 MED ORDER — APIXABAN 5 MG PO TABS
5.0000 mg | ORAL_TABLET | Freq: Two times a day (BID) | ORAL | Status: DC
  Administered 2024-11-06 – 2024-11-09 (×6): 5 mg via ORAL
  Filled 2024-11-06 (×6): qty 1

## 2024-11-06 NOTE — ED Provider Notes (Signed)
 [place] EMERGENCY DEPARTMENT AT [facility] Provider Note   CSN: [id number] Arrival date & time: 11/06/24  9089     Patient presents with: Shortness of Breath   Destiny Brown is a 78 y.o. female.   78 year old female with prior medical history as detailed below presents for evaluation.  Patient complains of worsening shortness of breath.  She reports that she has noticed increasing cough and increasing dyspnea times at least 2 to 3 days.  She denies fever.  She denies pain.  She checked her O2 sats at home and reports that her sats were as low as 76% on room air.  She does not have home O2 oxygen.  With supplemental O2 in the ED at 4 L nasal cannula her sats are in the upper 90s.  She reports feeling much better.  The history is provided by the patient and medical records.       Prior to Admission medications   Medication Sig Start Date End Date Taking? Authorizing Provider  albuterol  (VENTOLIN  HFA) 108 (90 Base) MCG/ACT inhaler Inhale 2 puffs into the lungs every 6 (six) hours as needed for wheezing or shortness of breath (Cough). 11/21/22   Joesph Shaver Scales, PA-C  amiodarone  (PACERONE ) 100 MG tablet Take 1 tablet (100 mg total) by mouth daily. 10/10/24   Cindie Ole DASEN, MD  amLODipine  (NORVASC ) 5 MG tablet Take 1 tablet (5 mg total) by mouth daily. 07/07/24   Lelon Hamilton T, PA-C  apixaban  (ELIQUIS ) 5 MG TABS tablet Take 1 tablet (5 mg total) by mouth 2 (two) times daily. 05/12/24   Arlon Carliss ORN, DO  Cholecalciferol  (VITAMIN D3) 1000 UNITS CAPS Take 1,000 Units by mouth in the morning.    [provider]  cyanocobalamin (FINEST NUTRITION VITAMIN B-12) 500 MCG tablet Take 500 mcg by mouth daily. 01/03/24   [provider]  empagliflozin  (JARDIANCE ) 10 MG TABS tablet Take 1 tablet (10 mg total) by mouth daily before breakfast. 06/14/24   Lelon Hamilton T, PA-C  furosemide  (LASIX ) 40 MG tablet Take 1 tablet (40 mg total) by mouth daily as  needed for edema or fluid. As needed in case of weight gain 1 to 2 lbs in 24 hrs or 5 lbs in 7 days. 06/13/24   Lelon Hamilton T, PA-C  hydrALAZINE  (APRESOLINE ) 50 MG tablet Take 1 tablet (50 mg total) by mouth 3 (three) times daily. 07/10/24   Hayes Beckey CROME, NP  iron polysaccharides (NIFEREX) 150 MG capsule Take 150 mg by mouth daily. 10/19/24   [provider]  isosorbide  mononitrate (IMDUR ) 60 MG 24 hr tablet Take 1 tablet (60 mg total) by mouth daily. 08/07/24 11/05/24  Lelon Hamilton T, PA-C  latanoprost  (XALATAN ) 0.005 % ophthalmic solution Place 1 drop into both eyes at bedtime. 04/11/23   [provider]  levothyroxine  (SYNTHROID ) 100 MCG tablet Take 1 tablet (100 mcg total) by mouth daily at 6 (six) AM. 05/13/24   Arlon, Carliss ORN, DO  pregabalin  (LYRICA ) 50 MG capsule Take 1 capsule (50 mg total) by mouth daily. 06/11/24   Arrien, Elidia Sieving, MD  rosuvastatin  (CRESTOR ) 5 MG tablet Take 5 mg by mouth 2 (two) times a week. Mondays & Thursdays.    [provider]    Allergies: Oxycodone and Codeine    Review of Systems  All other systems reviewed and are negative.   Updated Vital Signs BP (!) 182/79 (BP Location: Right Arm)   Pulse 73   Temp  97.7 F (36.5 C)   Resp 18   Ht 5' 5 (1.651 m)   Wt 86.2 kg   SpO2 91%   BMI 31.62 kg/m   Physical Exam Vitals and nursing note reviewed.  Constitutional:      General: She is not in acute distress.    Appearance: She is well-developed.  HENT:     Head: Normocephalic and atraumatic.  Eyes:     Conjunctiva/sclera: Conjunctivae normal.  Cardiovascular:     Rate and Rhythm: Normal rate and regular rhythm.     Heart sounds: No murmur heard. Pulmonary:     Effort: Pulmonary effort is normal. No respiratory distress.     Breath sounds: Examination of the right-lower field reveals decreased breath sounds. Examination of the left-lower field reveals decreased breath sounds. Decreased breath sounds present.   Abdominal:     Palpations: Abdomen is soft.     Tenderness: There is no abdominal tenderness.  Musculoskeletal:        General: No swelling.     Cervical back: Neck supple.     Right lower leg: Edema present.     Left lower leg: No edema.  Skin:    General: Skin is warm and dry.     Capillary Refill: Capillary refill takes less than 2 seconds.  Neurological:     Mental Status: She is alert.  Psychiatric:        Mood and Affect: Mood normal.     (all labs ordered are listed, but only abnormal results are displayed) Labs Reviewed - No data to display  EKG: None  Radiology: No results found.   Procedures   Medications Ordered in the ED - No data to display                                  Medical Decision Making Presents with worsening cough and shortness of breath.  Patient is noted be hypoxic on room air.  With supplemental 4 L nasal cannula her sats are up into the mid 90s.  She feels much improved.  Workup demonstrates worsening mild hyponatremia.  COVID and flu testing is negative.  Troponin is 19.  BNP is 330.  CT chest suggests bronchopneumonia.  Antibiotics given for possible bacterial CAP.  Given symptoms, hypoxia, hyponatremia will admit for treatment and further workup.    Hospitalist service made aware of case.  Amount and/or Complexity of Data Reviewed Labs: ordered. Radiology: ordered.  Risk Decision regarding hospitalization.        Final diagnoses:  Dyspnea, unspecified type    ED Discharge Orders     None          Laurice Maude BROCKS, MD 11/06/24 1311

## 2024-11-06 NOTE — ED Notes (Signed)
 Hospitalist at bedside

## 2024-11-06 NOTE — ED Notes (Signed)
 Assumed care of patient. No report received. Meds due at 1315. Will give as soon as possible. IV kinked and not infusing with abx on pump. Will initiate new IV.

## 2024-11-06 NOTE — ED Notes (Signed)
 [facility] has been called for transfer to [facility]

## 2024-11-06 NOTE — ED Notes (Signed)
 Pt placed on cardiac monitor

## 2024-11-06 NOTE — ED Notes (Signed)
 Pt urinated in bed. I cleaned bed, and helped clean pt.

## 2024-11-06 NOTE — H&P (Signed)
 History and Physical    Patient: Destiny Brown FMW:[id number] DOB: April 10, 1946 DOA: 11/06/2024 DOS: the patient was seen and examined on 11/06/2024 PCP: Nichole Senior, MD  Patient coming from: Home  Chief Complaint:  Chief Complaint  Patient presents with   Shortness of Breath   HPI: Destiny Brown is a 78 y.o. female with medical history significant of Afib, HTN, DM2, and OSA p/w cough/SOB and found to have CAP and HFpEF exacerbation.  The patient reported experiencing a cough for over a week, which was initially thought to be related to sinusitis. The patient noted that symptoms worsened two days prior and included wheezing and fatigue. On the day of the visit, the patient experienced significant shortness of breath and home O2 sats as low as 70s, which prompted her ED visit. Of note, pt denies any recent antibiotic use.  In the ED, pt hypertensive. Labs notable for Na 128, Cr 1.9 (baseline), and BNP 330. CT chest showed Scattered peribronchovascular ground-glass, indicative of bronchopneumonia. EDP started IV CTX/azithromycin  and requested medicine admission.    Review of Systems: As mentioned in the history of present illness. All other systems reviewed and are negative. Past Medical History:  Diagnosis Date   Arthritis    Atrial fibrillation with RVR (HCC) 09/21/2014   Bronchitis    h/o   Diabetes mellitus    NO MEDS,  DIET CONTROLLED   History of migraines    when working under alot of stress   Hypertension    Hypothyroid    LBBB (left bundle branch block) 09/22/2014   OSA (obstructive sleep apnea) 04/09/2015   does not use cpap   PONV (postoperative nausea and vomiting)    PT DOESNOT WANT TO TAKE HER ATENOLOL      DOS.    IT DROPS MY BP TO LOW   Past Surgical History:  Procedure Laterality Date   ABDOMINAL HYSTERECTOMY  03/2001   APPENDECTOMY     BACK SURGERY     CATARACT EXTRACTION, BILATERAL     WITH IMPLANTS   CERVICAL FUSION  08/2003    CHOLECYSTECTOMY  03/2010   COLONOSCOPY WITH PROPOFOL  N/A 08/27/2023   Procedure: COLONOSCOPY WITH PROPOFOL ;  Surgeon: Rollin Dover, MD;  Location: [facility] ENDOSCOPY;  Service: Gastroenterology;  Laterality: N/A;   CORONARY STENT INTERVENTION N/A 04/23/2023   Procedure: CORONARY STENT INTERVENTION;  Surgeon: Wonda Sharper, MD;  Location: [facility] INVASIVE CV LAB;  Service: Cardiovascular;  Laterality: N/A;   ESOPHAGOGASTRODUODENOSCOPY N/A 08/27/2023   Procedure: ESOPHAGOGASTRODUODENOSCOPY (EGD);  Surgeon: Rollin Dover, MD;  Location: [facility];  Service: Gastroenterology;  Laterality: N/A;   EYE SURGERY     GIVENS CAPSULE STUDY N/A 11/18/2023   Procedure: GIVENS CAPSULE STUDY;  Surgeon: Rollin Dover, MD;  Location: [facility] ENDOSCOPY;  Service: Gastroenterology;  Laterality: N/A;   LEFT HEART CATH AND CORONARY ANGIOGRAPHY N/A 04/23/2023   Procedure: LEFT HEART CATH AND CORONARY ANGIOGRAPHY;  Surgeon: Wonda Sharper, MD;  Location: [facility] INVASIVE CV LAB;  Service: Cardiovascular;  Laterality: N/A;   LEFT HEART CATH AND CORONARY ANGIOGRAPHY N/A 05/11/2024   Procedure: LEFT HEART CATH AND CORONARY ANGIOGRAPHY;  Surgeon: Darron Deatrice DELENA, MD;  Location: [facility] INVASIVE CV LAB;  Service: Cardiovascular;  Laterality: N/A;   LUMBAR DISC SURGERY     LUMBAR LAMINECTOMY/DECOMPRESSION MICRODISCECTOMY  10/2010   POSTERIOR FUSION LUMBAR SPINE  12/16/11; 12/2005; 07/2004   L2-3; L3-4; L4-5   REVERSE SHOULDER ARTHROPLASTY Right 02/26/2022   Procedure: REVERSE SHOULDER ARTHROPLASTY;  Surgeon: Dozier Soulier, MD;  Location: [facility] ORS;  Service: Orthopedics;  Laterality: Right;   TOTAL KNEE ARTHROPLASTY Right 03/18/2017   Procedure: RIGHT TOTAL KNEE ARTHROPLASTY;  Surgeon: Lamar Collet, MD;  Location: [facility] OR[facility];  Service: Orthopedics;  Laterality: Right;   Social History:  reports that she has never smoked. She has never used smokeless tobacco. She reports that she does not drink alcohol and does not use drugs.  Allergies  Allergen  Reactions   Oxycodone Other (See Comments)    Severe headaches, hallucinations    Codeine Other (See Comments)    Severe Headaches with codeine; but not with other opioids    Family History  Adopted: Yes  Family history unknown: Yes    Prior to Admission medications   Medication Sig Start Date End Date Taking? Authorizing Provider  albuterol  (VENTOLIN  HFA) 108 (90 Base) MCG/ACT inhaler Inhale 2 puffs into the lungs every 6 (six) hours as needed for wheezing or shortness of breath (Cough). 11/21/22   Joesph Shaver Scales, PA-C  amiodarone  (PACERONE ) 100 MG tablet Take 1 tablet (100 mg total) by mouth daily. 10/10/24   Cindie Ole DASEN, MD  amLODipine  (NORVASC ) 5 MG tablet Take 1 tablet (5 mg total) by mouth daily. 07/07/24   Lelon Hamilton T, PA-C  apixaban  (ELIQUIS ) 5 MG TABS tablet Take 1 tablet (5 mg total) by mouth 2 (two) times daily. 05/12/24   Arlon Carliss ORN, DO  Cholecalciferol  (VITAMIN D3) 1000 UNITS CAPS Take 1,000 Units by mouth in the morning.    [provider]  cyanocobalamin (FINEST NUTRITION VITAMIN B-12) 500 MCG tablet Take 500 mcg by mouth daily. 01/03/24   [provider]  empagliflozin  (JARDIANCE ) 10 MG TABS tablet Take 1 tablet (10 mg total) by mouth daily before breakfast. 06/14/24   Lelon Hamilton T, PA-C  furosemide  (LASIX ) 40 MG tablet Take 1 tablet (40 mg total) by mouth daily as needed for edema or fluid. As needed in case of weight gain 1 to 2 lbs in 24 hrs or 5 lbs in 7 days. 06/13/24   Lelon Hamilton T, PA-C  hydrALAZINE  (APRESOLINE ) 50 MG tablet Take 1 tablet (50 mg total) by mouth 3 (three) times daily. 07/10/24   Hayes Beckey CROME, NP  iron polysaccharides (NIFEREX) 150 MG capsule Take 150 mg by mouth daily. 10/19/24   [provider]  isosorbide  mononitrate (IMDUR ) 60 MG 24 hr tablet Take 1 tablet (60 mg total) by mouth daily. 08/07/24 11/05/24  Lelon Hamilton T, PA-C  latanoprost  (XALATAN ) 0.005 % ophthalmic solution Place 1 drop into both  eyes at bedtime. 04/11/23   [provider]  levothyroxine  (SYNTHROID ) 100 MCG tablet Take 1 tablet (100 mcg total) by mouth daily at 6 (six) AM. 05/13/24   Arlon, Carliss ORN, DO  levothyroxine  (SYNTHROID ) 137 MCG tablet Take 137 mcg by mouth every morning. 10/19/24   [provider]  pregabalin  (LYRICA ) 50 MG capsule Take 1 capsule (50 mg total) by mouth daily. 06/11/24   Arrien, Elidia Sieving, MD  rosuvastatin  (CRESTOR ) 5 MG tablet Take 5 mg by mouth 2 (two) times a week. Mondays & Thursdays.    [provider]    Physical Exam: Vitals:   11/06/24 0921 11/06/24 1000 11/06/24 1030 11/06/24 1130  BP:  (!) 164/59 (!) 177/62 (!) 177/58  Pulse:  64 67 61  Resp:  17 15 17   Temp:      SpO2:  96% 96% 96%  Weight: 86.2 kg     Height: 5' 5 (1.651  m)      General: Alert, oriented x3, resting comfortably in no acute distress Respiratory: Bibasilar rales; moderate wheezing Cardiovascular: Regular rate and rhythm w/o m/r/g   Data Reviewed:  Lab Results  Component Value Date   WBC 7.3 11/06/2024   HGB 11.9 (L) 11/06/2024   HGB 11.6 (L) 11/06/2024   HCT 35.0 (L) 11/06/2024   HCT 34.0 (L) 11/06/2024   MCV 81.0 11/06/2024   PLT 178 11/06/2024   Lab Results  Component Value Date   GLUCOSE 161 (H) 11/06/2024   CALCIUM  9.6 11/06/2024   NA 128 (L) 11/06/2024   NA 128 (L) 11/06/2024   K 4.0 11/06/2024   K 4.0 11/06/2024   CO2 18 (L) 11/06/2024   CL 98 11/06/2024   BUN 21 11/06/2024   CREATININE 1.90 (H) 11/06/2024   Lab Results  Component Value Date   ALT 16 11/06/2024   AST 28 11/06/2024   ALKPHOS 80 11/06/2024   BILITOT 1.1 11/06/2024   Lab Results  Component Value Date   INR 1.19 03/10/2017   Radiology: CT CHEST WO CONTRAST Result Date: 11/06/2024 CLINICAL DATA:  Respiratory illness. EXAM: CT CHEST WITHOUT CONTRAST TECHNIQUE: Multidetector CT imaging of the chest was performed following the standard protocol without IV contrast. RADIATION DOSE  REDUCTION: This exam was performed according to the departmental dose-optimization program which includes automated exposure control, adjustment of the mA and/or kV according to patient size and/or use of iterative reconstruction technique. COMPARISON:  Chest radiograph 11/06/2024. FINDINGS: Cardiovascular: Atherosclerotic calcification of the aorta, aortic valve and coronary arteries. Enlarged pulmonic trunk and heart. No pericardial effusion. Mediastinum/Nodes: No pathologically enlarged mediastinal or axillary lymph nodes. Hilar regions are difficult to definitively evaluate without IV contrast. Esophagus is grossly unremarkable. Lungs/Pleura: Image quality is degraded by expiratory phase imaging and respiratory motion. Scattered peribronchovascular ground-glass. Minimal dependent atelectasis in the right lower lobe. No pleural fluid. Airway is otherwise unremarkable. Upper Abdomen: Cholecystectomy. Tiny hiatal hernia. Visualized portions of the liver, adrenal glands, left kidney, spleen, pancreas, stomach and bowel are otherwise grossly unremarkable. No upper abdominal adenopathy. Musculoskeletal: Degenerative changes in the spine. Partially imaged lumbar fusion. Spinal stimulator wires. Right shoulder arthroplasty. IMPRESSION: 1. Scattered peribronchovascular ground-glass, indicative of bronchopneumonia. 2. Aortic atherosclerosis (ICD10-I70.0). Coronary artery calcification. 3. Enlarged pulmonic trunk, indicative of pulmonary arterial hypertension. Electronically Signed   By: Newell Eke M.D.   On: 11/06/2024 13:00   DG Chest Port 1 View Result Date: 11/06/2024 EXAM: 1 VIEW(S) XRAY OF THE CHEST 11/06/2024 10:02:38 AM COMPARISON: 06/04/2024 CLINICAL HISTORY: sob. Cough with shortness of breath, weakness, and hypoxemia for several weeks. FINDINGS: LINES, TUBES AND DEVICES: Spinal stimulator in place. Partially seen anterior cervical discectomy and fusion hardware. Right shoulder reverse arthroplasty.  LUNGS AND PLEURA: No focal pulmonary opacity. No pulmonary edema. No pleural effusion. No pneumothorax. HEART AND MEDIASTINUM: Stable mild cardiomegaly. Aortic arch calcification. BONES AND SOFT TISSUES: No acute osseous abnormality. IMPRESSION: 1. No acute cardiopulmonary findings. 2. Stable mild cardiomegaly. Electronically signed by: Elsie Perone MD 11/06/2024 11:04 AM EST RP Workstation: [id number]    Assessment and Plan: 76F h/o Afib, HTN, DM2, and OSA p/w cough/SOB and found to have CAP and HFpEF exacerbation.  HFpEF exacerbation -IV lasix  40mg  BID for now; goal net neg 1-2L/d; strict I/Os; daily standing weights; K>4/Mg>2  CAP -IV CTX 1g daily to complete 5 day CAP course -PO azithromycin  500mg  daily to complete 3 day CAP course -Duonebs prn -Wean O2 as tolerated -Ambulatory pulse ox prior  to d/c -F/u procalcitonin  Afib -PTA amiodarone  100mg  daily and Eliquis  5mg  BID  Hypothyroid -PTA Synthroid   OSA -CPAP at bedtime   Advance Care Planning:   Code Status: Full Code   Consults: N/A  Family Communication: Husband  Severity of Illness: The appropriate patient status for this patient is INPATIENT. Inpatient status is judged to be reasonable and necessary in order to provide the required intensity of service to ensure the patient's safety. The patient's presenting symptoms, physical exam findings, and initial radiographic and laboratory data in the context of their chronic comorbidities is felt to place them at high risk for further clinical deterioration. Furthermore, it is not anticipated that the patient will be medically stable for discharge from the hospital within 2 midnights of admission.   * I certify that at the point of admission it is my clinical judgment that the patient will require inpatient hospital care spanning beyond 2 midnights from the point of admission due to high intensity of service, high risk for further deterioration and high frequency of  surveillance required.*   ------- I spent 55 minutes reviewing previous notes, at the bedside counseling/discussing the treatment plan, and performing clinical documentation.   Author: Marsha Ada, MD 11/06/2024 1:13 PM  For on call review [email or web address].

## 2024-11-06 NOTE — ED Notes (Signed)
 Pt ambulated to bathroom. Oxygen tank was brought with her, stated she felt weak and still short of breath.

## 2024-11-06 NOTE — ED Triage Notes (Signed)
 Pt states she has had a cough for a few weeks that has worsened past couple of days. Pt has had shortness of breath and weakness. Pt denies pain. Pt 02 sat 89% RA on arrival, pt does not wear oxygen at home. Pt states her 02 last night was 76%.

## 2024-11-06 NOTE — Telephone Encounter (Signed)
 Patient called in stating she is having increased shortness of breath (which has been progressive the last week) with oxygen saturations falling in the low 80s overnight. Positive for cough that is productive yellow sputum and probable low grade fever. Pt states she has been sick the last 3 weeks. She thinks she may be in/out of afib as well. Pt is enroute to the ER already on the phone via private vehicle. Encouraged patient to be assessed with current symptoms.

## 2024-11-07 DIAGNOSIS — J9601 Acute respiratory failure with hypoxia: Secondary | ICD-10-CM | POA: Diagnosis not present

## 2024-11-07 DIAGNOSIS — I5033 Acute on chronic diastolic (congestive) heart failure: Secondary | ICD-10-CM | POA: Diagnosis not present

## 2024-11-07 DIAGNOSIS — J189 Pneumonia, unspecified organism: Secondary | ICD-10-CM | POA: Diagnosis not present

## 2024-11-07 MED ORDER — LEVOTHYROXINE SODIUM 25 MCG PO TABS: 137.0000 ug | ORAL_TABLET | Freq: Every morning | ORAL | Status: DC

## 2024-11-07 MED ORDER — LEVOTHYROXINE SODIUM 25 MCG PO TABS
137.0000 ug | ORAL_TABLET | Freq: Every morning | ORAL | Status: DC
  Administered 2024-11-08 – 2024-11-09 (×2): 137 ug via ORAL
  Filled 2024-11-07 (×2): qty 1

## 2024-11-07 MED ORDER — GUAIFENESIN-DM 100-10 MG/5ML PO SYRP
5.0000 mL | ORAL_SOLUTION | ORAL | Status: DC | PRN
  Administered 2024-11-07 – 2024-11-09 (×5): 5 mL via ORAL
  Filled 2024-11-07 (×5): qty 10

## 2024-11-07 NOTE — TOC Initial Note (Signed)
 Transition of Care [facility]) - Initial/Assessment Note   Patient Details  Name: Destiny Brown MRN: [id number] Date of Birth: 1946-11-08  [facility] of [facility]) CM/SW Contact:    Duwaine GORMAN Aran, LCSW Phone Number: 11/07/2024, 2:05 PM  Clinical Narrative: Patient is from home with spouse. Patient is currently on IV antibiotics and 4L/min oxygen. Patient is not on home oxygen at baseline. PT consulted. Care management following for PT recommendations and possible DME needs.  Expected Discharge Plan:  (TBD) Barriers to Discharge: Continued Medical Work up  Expected Discharge Plan and Services In-house Referral: Clinical Social Work Living arrangements for the past 2 months: Single Family Home            DME Arranged: N/A DME Agency: NA  Prior Living Arrangements/Services Living arrangements for the past 2 months: Single Family Home Lives with:: Spouse Patient language and need for interpreter reviewed:: Yes Do you feel safe going back to the place where you live?: Yes      Need for Family Participation in Patient Care: No (Comment) Care giver support system in place?: Yes (comment) Criminal Activity/Legal Involvement Pertinent to Current Situation/Hospitalization: No - Comment as needed  Activities of Daily Living ADL Screening (condition at time of admission) Independently performs ADLs?: No Does the patient have a NEW difficulty with bathing/dressing/toileting/self-feeding that is expected to last >3 days?: No Does the patient have a NEW difficulty with getting in/out of bed, walking, or climbing stairs that is expected to last >3 days?: Yes (Initiates electronic notice to provider for possible PT consult) Does the patient have a NEW difficulty with communication that is expected to last >3 days?: No Is the patient deaf or have difficulty hearing?: Yes Does the patient have difficulty seeing, even when wearing glasses/contacts?: Yes Does the patient have difficulty concentrating,  remembering, or making decisions?: Yes  Emotional Assessment Orientation: : Oriented to Self, Oriented to Place, Oriented to  Time, Oriented to Situation Alcohol / Substance Use: Not Applicable Psych Involvement: No (comment)  Admission diagnosis:  CAP (community acquired pneumonia) [J18.9] Dyspnea, unspecified type [R06.00] Patient Active Problem List   Diagnosis Date Noted   CAP (community acquired pneumonia) 11/06/2024   Anemia of chronic renal failure 10/23/2024   Chronic kidney disease, stage 4 (severe) (HCC) 06/08/2024   Obesity, class 1 06/08/2024   Type 2 diabetes mellitus with hyperlipidemia (HCC) 06/08/2024   Acute on chronic systolic CHF (congestive heart failure) (HCC) 06/05/2024   Acute hypoxic respiratory failure (HCC) 06/03/2024   Acute CHF (congestive heart failure) (HCC) 06/02/2024   Sinus bradycardia 05/12/2024   Monitoring for long-term anticoagulant use 05/12/2024   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   Persistent atrial fibrillation (HCC) 05/10/2024   Hypothyroidism 05/10/2024   Atrial fibrillation (HCC) 05/10/2024   Atrial fibrillation with rapid ventricular response (HCC) 05/09/2024   Ventricular tachycardia (HCC) 05/09/2024   Long term (current) use of anticoagulants 05/09/2024   Benign hypertension 05/09/2024   Abnormal findings diagnostic imaging of heart and coronary circulation 05/09/2024   Atherosclerotic heart disease 05/09/2024   Wide-complex tachycardia 05/09/2024   Abnormal CT scan, heart 04/23/2023   CAD (coronary artery disease) 04/20/2023   Preoperative cardiovascular examination 04/20/2023   Chronic cough 02/19/2023   Palpitations 02/02/2023   PAF (paroxysmal atrial fibrillation) (HCC) 08/10/2019   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   Diplopia 01/20/2016   Obesity (BMI 30-39.9) 06/26/2015   OSA (obstructive sleep apnea) 04/09/2015   LBBB (left bundle branch block) 09/22/2014   Elevated troponin 09/22/2014  Atrial fibrillation with RVR  (HCC) 09/21/2014   Diabetes type 2, controlled (HCC) 09/21/2014   Migraine headache 09/21/2014   Hypothyroid    Hypertension    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 [place] [address] Phone: [phone] Fax: [phone]  Social Drivers of Health (SDOH) Social History: SDOH Screenings   Food Insecurity: No Food Insecurity (11/06/2024)  Housing: Low Risk  (11/06/2024)  Transportation Needs: No Transportation Needs (11/06/2024)  Utilities: Not At Risk (11/06/2024)  Alcohol Screen: Low Risk  (06/05/2024)  Depression (PHQ2-9): Low Risk  (08/04/2023)  Financial Resource Strain: Low Risk  (06/05/2024)  Social Connections: Socially Integrated (11/06/2024)  Tobacco Use: Low Risk  (11/06/2024)   SDOH Interventions:    Readmission Risk Interventions     No data to display

## 2024-11-07 NOTE — Plan of Care (Signed)
  Problem: Clinical Measurements: Goal: Ability to maintain clinical measurements within normal limits will improve Outcome: Progressing Goal: Respiratory complications will improve Outcome: Progressing Goal: Cardiovascular complication will be avoided Outcome: Progressing   Problem: Safety: Goal: Ability to remain free from injury will improve Outcome: Progressing   Problem: Skin Integrity: Goal: Risk for impaired skin integrity will decrease Outcome: Progressing

## 2024-11-07 NOTE — Progress Notes (Signed)
 Mobility Specialist - Progress Note   11/07/24 1515  Mobility  Activity Ambulated with assistance  Level of Assistance Contact guard assist, steadying assist  Assistive Device Front wheel walker  Distance Ambulated (ft) 50 ft  Range of Motion/Exercises Active  Activity Response Tolerated well  Mobility visit 1 Mobility  Mobility Specialist Start Time (ACUTE ONLY) 1450  Mobility Specialist Stop Time (ACUTE ONLY) 1515  Mobility Specialist Time Calculation (min) (ACUTE ONLY) 25 min   Pt was found in bed and agreeable to mobilize. Grew fatigued with session. At EOS returned to recliner chair with all needs met. Call bell in reach and husband in room. Chair alarm on.   Erminio Leos,  Mobility Specialist Can be reached via Secure Chat

## 2024-11-07 NOTE — Progress Notes (Signed)
 [facility]   Bed Availability Yes  Level of Care Needed:  Yes  MD Agree to transfer: Yes  Patient agree to transfer: Yes  Sharolyn Batman, RN [facility]

## 2024-11-07 NOTE — Progress Notes (Addendum)
 PROGRESS NOTE    Destiny Brown  FMW:[id number] DOB: [birth year] DOA: 11/06/2024 PCP: Nichole Senior, MD   Brief Narrative:  Destiny Brown is a 78 y.o. female with medical history significant of Afib, HTN, DM2, and OSA p/w cough/SOB and found to have CAP and HFpEF exacerbation.  Assessment & Plan:   Principal Problem:   CAP (community acquired pneumonia)  Acute hypoxic respiratory failure, multifactorial, POA  - Patient on room air at baseline, currently requiring 5 L nasal cannula to maintain sats even at rest, likely concurrent secondary to volume overload in the setting of heart failure exacerbation as well as community-acquired pneumonia. HFpEF exacerbation - Continue diuretics with IV Lasix  40 twice daily, follow I's and O's/daily weights  Hypervolemic hyponatremia - Likely to improve with diuretics, follow repeat labs  Community acquired pneumonia, POA -Patient does not meet sepsis criteria -Continue ceftriaxone azithromycin  -Continue nebs as needed, wean oxygen as appropriate   Afib - Continue amiodarone , Eliquis    Hypothyroid - Continue prior Synthroid  -repeat testing outpatient setting is appropriate   OSA -CPAP at bedtime  DVT prophylaxis: SCDs Start: 11/06/24 1311 apixaban  (ELIQUIS ) tablet 5 mg  Code Status:   Code Status: Full Code Family Communication: At bedside  Status is: Inpatient  Dispo: The patient is from: Home              Anticipated d/c is to: Home              Anticipated d/c date is: 24 to 48 hours              Patient currently not medically stable for discharge  Consultants:  None  Procedures:  None  Antimicrobials:  Ceftriaxone, azithromycin   Subjective: No acute issues or events overnight, respiratory status improving but not yet back to baseline, denies chest pain headache fevers chills nausea vomiting diarrhea or constipation  Objective: Vitals:   11/06/24 1741 11/06/24 1930 11/06/24 2128 11/07/24 0431  BP:  (!)  155/54  (!) 142/51  Pulse:  63  (!) 55  Resp:  18  17  Temp: 98.1 F (36.7 C)  97.8 F (36.6 C) 97.8 F (36.6 C)  TempSrc: Oral  Oral Oral  SpO2:  94%  100%  Weight:      Height:        Intake/Output Summary (Last 24 hours) at 11/07/2024 0735 Last data filed at 11/07/2024 0600 Gross per 24 hour  Intake 350 ml  Output 250 ml  Net 100 ml   Filed Weights   11/06/24 0921  Weight: 86.2 kg    Examination:  General:  Pleasantly resting in bed, No acute distress. HEENT:  Normocephalic atraumatic.  Sclerae nonicteric, noninjected.  Extraocular movements intact bilaterally. Neck:  Without mass or deformity.  Trachea is midline. Lungs: Bibasilar rales without overt rhonchi or wheeze. Heart:  Regular rate and rhythm.  Without murmurs, rubs, or gallops. Abdomen:  Soft, nontender, nondistended.  Without guarding or rebound. Extremities: Without cyanosis, clubbing, 1+ pitting edema bilateral lower extremities. Skin:  Warm and dry, no erythema.  Data Reviewed: I have personally reviewed following labs and imaging studies  CBC: Recent Labs  Lab 11/06/24 0950 11/06/24 1002  WBC 7.3  --   NEUTROABS 5.7  --   HGB 10.8* 11.6*  11.9*  HCT 34.2* 34.0*  35.0*  MCV 81.0  --   PLT 178  --    Basic Metabolic Panel: Recent Labs  Lab 11/06/24 0950 11/06/24 1002  NA 129* 128*  128*  K 4.0 4.0  4.0  CL 96* 98  CO2 18*  --   GLUCOSE 149* 161*  BUN 20 21  CREATININE 1.82* 1.90*  CALCIUM  9.6  --    GFR: Estimated Creatinine Clearance: 26 mL/min (A) (by C-G formula based on SCr of 1.9 mg/dL (H)). Liver Function Tests: Recent Labs  Lab 11/06/24 0950  AST 28  ALT 16  ALKPHOS 80  BILITOT 1.1  PROT 7.7  ALBUMIN  3.9   No results for input(s): LIPASE, AMYLASE in the last 168 hours. No results for input(s): AMMONIA in the last 168 hours. Coagulation Profile: No results for input(s): INR, PROTIME in the last 168 hours. Cardiac Enzymes: No results for input(s):  CKTOTAL, CKMB, CKMBINDEX, TROPONINI in the last 168 hours. BNP (last 3 results) No results for input(s): PROBNP in the last 8760 hours. HbA1C: No results for input(s): HGBA1C in the last 72 hours. CBG: No results for input(s): GLUCAP in the last 168 hours. Lipid Profile: No results for input(s): CHOL, HDL, LDLCALC, TRIG, CHOLHDL, LDLDIRECT in the last 72 hours. Thyroid  Function Tests: No results for input(s): TSH, T4TOTAL, FREET4, T3FREE, THYROIDAB in the last 72 hours. Anemia Panel: No results for input(s): VITAMINB12, FOLATE, FERRITIN, TIBC, IRON, RETICCTPCT in the last 72 hours. Sepsis Labs: Recent Labs  Lab 11/06/24 1002 11/06/24 1137 11/06/24 1146  PROCALCITON  --  <0.10  --   LATICACIDVEN 1.2  --  1.1    Recent Results (from the past 240 hours)  Resp panel by RT-PCR (RSV, Flu A&B, Covid) Anterior Nasal Swab     Status: None   Collection Time: 11/06/24  9:38 AM   Specimen: Anterior Nasal Swab  Result Value Ref Range Status   SARS Coronavirus 2 by RT PCR NEGATIVE NEGATIVE Final   Influenza A by PCR NEGATIVE NEGATIVE Final   Influenza B by PCR NEGATIVE NEGATIVE Final    Comment: (NOTE) The Xpert Xpress SARS-CoV-2/FLU/RSV plus assay is intended as an aid in the diagnosis of influenza from Nasopharyngeal swab specimens and should not be used as a sole basis for treatment. Nasal washings and aspirates are unacceptable for Xpert Xpress SARS-CoV-2/FLU/RSV testing.  Fact Sheet for Patients: bloggercourse.com  Fact Sheet for Healthcare Providers: seriousbroker.it  This test is not yet approved or cleared by the [place]  FDA and has been authorized for detection and/or diagnosis of SARS-CoV-2 by FDA under an Emergency Use Authorization (EUA). This EUA will remain in effect (meaning this test can be used) for the duration of the COVID-19 declaration under Section 564(b)(1)  of the Act, 21 U.S.C. section 360bbb-3(b)(1), unless the authorization is terminated or revoked.     Resp Syncytial Virus by PCR NEGATIVE NEGATIVE Final    Comment: (NOTE) Fact Sheet for Patients: bloggercourse.com  Fact Sheet for Healthcare Providers: seriousbroker.it  This test is not yet approved or cleared by the [place]  FDA and has been authorized for detection and/or diagnosis of SARS-CoV-2 by FDA under an Emergency Use Authorization (EUA). This EUA will remain in effect (meaning this test can be used) for the duration of the COVID-19 declaration under Section 564(b)(1) of the Act, 21 U.S.C. section 360bbb-3(b)(1), unless the authorization is terminated or revoked.  Performed at [facility], [address] [place], [place] [address]          Radiology Studies: CT CHEST WO CONTRAST Result Date: 11/06/2024 CLINICAL DATA:  Respiratory illness. EXAM: CT CHEST WITHOUT CONTRAST TECHNIQUE: Multidetector CT imaging of the chest  was performed following the standard protocol without IV contrast. RADIATION DOSE REDUCTION: This exam was performed according to the departmental dose-optimization program which includes automated exposure control, adjustment of the mA and/or kV according to patient size and/or use of iterative reconstruction technique. COMPARISON:  Chest radiograph 11/06/2024. FINDINGS: Cardiovascular: Atherosclerotic calcification of the aorta, aortic valve and coronary arteries. Enlarged pulmonic trunk and heart. No pericardial effusion. Mediastinum/Nodes: No pathologically enlarged mediastinal or axillary lymph nodes. Hilar regions are difficult to definitively evaluate without IV contrast. Esophagus is grossly unremarkable. Lungs/Pleura: Image quality is degraded by expiratory phase imaging and respiratory motion. Scattered peribronchovascular ground-glass. Minimal dependent atelectasis in the right lower lobe.  No pleural fluid. Airway is otherwise unremarkable. Upper Abdomen: Cholecystectomy. Tiny hiatal hernia. Visualized portions of the liver, adrenal glands, left kidney, spleen, pancreas, stomach and bowel are otherwise grossly unremarkable. No upper abdominal adenopathy. Musculoskeletal: Degenerative changes in the spine. Partially imaged lumbar fusion. Spinal stimulator wires. Right shoulder arthroplasty. IMPRESSION: 1. Scattered peribronchovascular ground-glass, indicative of bronchopneumonia. 2. Aortic atherosclerosis (ICD10-I70.0). Coronary artery calcification. 3. Enlarged pulmonic trunk, indicative of pulmonary arterial hypertension. Electronically Signed   By: Newell Eke M.D.   On: 11/06/2024 13:00   DG Chest Port 1 View Result Date: 11/06/2024 EXAM: 1 VIEW(S) XRAY OF THE CHEST 11/06/2024 10:02:38 AM COMPARISON: 06/04/2024 CLINICAL HISTORY: sob. Cough with shortness of breath, weakness, and hypoxemia for several weeks. FINDINGS: LINES, TUBES AND DEVICES: Spinal stimulator in place. Partially seen anterior cervical discectomy and fusion hardware. Right shoulder reverse arthroplasty. LUNGS AND PLEURA: No focal pulmonary opacity. No pulmonary edema. No pleural effusion. No pneumothorax. HEART AND MEDIASTINUM: Stable mild cardiomegaly. Aortic arch calcification. BONES AND SOFT TISSUES: No acute osseous abnormality. IMPRESSION: 1. No acute cardiopulmonary findings. 2. Stable mild cardiomegaly. Electronically signed by: Elsie Perone MD 11/06/2024 11:04 AM EST RP Workstation: [id number]        Scheduled Meds:  amiodarone   100 mg Oral Daily   amLODipine   5 mg Oral Daily   apixaban   5 mg Oral BID   azithromycin   500 mg Oral Daily   furosemide   40 mg Intravenous BID   isosorbide  mononitrate  60 mg Oral Daily   levothyroxine   100 mcg Oral Q0600   pregabalin   50 mg Oral Daily   rosuvastatin   5 mg Oral Once per day on Monday Thursday   Continuous Infusions:  cefTRIAXone (ROCEPHIN)  IV        LOS: 1 day   Time spent:  Elsie JAYSON Montclair, DO [facility]  If 7PM-7AM, please contact night-coverage [email or web address]  11/07/2024, 7:35 AM

## 2024-11-08 ENCOUNTER — Encounter (HOSPITAL_COMMUNITY): Payer: Self-pay

## 2024-11-08 ENCOUNTER — Inpatient Hospital Stay (HOSPITAL_COMMUNITY): Admission: RE | Admit: 2024-11-08 | Source: Ambulatory Visit

## 2024-11-08 DIAGNOSIS — J9601 Acute respiratory failure with hypoxia: Secondary | ICD-10-CM | POA: Diagnosis not present

## 2024-11-08 DIAGNOSIS — J189 Pneumonia, unspecified organism: Secondary | ICD-10-CM | POA: Diagnosis not present

## 2024-11-08 DIAGNOSIS — I5033 Acute on chronic diastolic (congestive) heart failure: Secondary | ICD-10-CM | POA: Diagnosis not present

## 2024-11-08 LAB — BASIC METABOLIC PANEL WITH GFR
Anion gap: 13 (ref 5–15)
BUN: 35 mg/dL — ABNORMAL HIGH (ref 8–23)
CO2: 25 mmol/L (ref 22–32)
Calcium: 9.5 mg/dL (ref 8.9–10.3)
Chloride: 94 mmol/L — ABNORMAL LOW (ref 98–111)
Creatinine, Ser: 2.63 mg/dL — ABNORMAL HIGH (ref 0.44–1.00)
GFR, Estimated: 18 mL/min — ABNORMAL LOW (ref >60)
Glucose, Bld: 104 mg/dL — ABNORMAL HIGH (ref 70–99)
Potassium: 3.4 mmol/L — ABNORMAL LOW (ref 3.5–5.1)
Sodium: 132 mmol/L — ABNORMAL LOW (ref 135–145)

## 2024-11-08 NOTE — Evaluation (Signed)
 Physical Therapy Evaluation Patient Details Name: Destiny Brown MRN: [id number] DOB: Nov 29, 1946 Today's Date: 11/08/2024  History of Present Illness  Destiny Brown is a 78 y.o. female with medical history significant of Afib, HTN, DM2, CKD 4, and OSA presented 11/06/24  cough/SOB and found to have CAP and HFpEF exacerbation.  Clinical Impression  Pt admitted with above diagnosis.  Pt currently with functional limitations due to the deficits listed below (see PT Problem List). Pt will benefit from acute skilled PT to increase their independence and safety with mobility to allow discharge.      The Patient participated in  toileting and ambulating x  80' using Rw while on RA with SPO2 remaining > 95%. RN notified. See separrate ambulation saturation note. Patient continued to   remain > 95% on RA while in recliner so supplemental O2 remained off .  Recommend HHPT.   Patient is independent, does use rollator when out  as needed, resides with spouse. No DME needs.    If plan is discharge home, recommend the following: A little help with walking and/or transfers;A little help with bathing/dressing/bathroom;Help with stairs or ramp for entrance;Assist for transportation   Can travel by private vehicle        Equipment Recommendations None recommended by PT  Recommendations for Other Services       Functional Status Assessment Patient has had a recent decline in their functional status and demonstrates the ability to make significant improvements in function in a reasonable and predictable amount of time.     Precautions / Restrictions Precautions Precaution/Restrictions Comments: monitor sats      Mobility  Bed Mobility Overal bed mobility: Modified Independent                  Transfers Overall transfer level: Needs assistance Equipment used: Rolling walker (2 wheels) Transfers: Sit to/from Stand Sit to Stand: Supervision                 Ambulation/Gait Ambulation/Gait assistance: Supervision Gait Distance (Feet): 20 Feet (then 80) Assistive device: Rolling walker (2 wheels) Gait Pattern/deviations: Step-through pattern   Gait velocity interpretation: 1.31 - 2.62 ft/sec, indicative of limited community ambulator   General Gait Details: gait  steady with RW , SPo2 > 95%on RA  Stairs            Wheelchair Mobility     Tilt Bed    Modified Rankin (Stroke Patients Only)       Balance Overall balance assessment: Mild deficits observed, not formally tested                                           Pertinent Vitals/Pain Pain Assessment Pain Assessment: No/denies pain    Home Living Family/patient expects to be discharged to:: Private residence Living Arrangements: Spouse/significant other Available Help at Discharge: Family Type of Home: House Home Access: Stairs to enter Entrance Stairs-Rails: Right Entrance Stairs-Number of Steps: 3   Home Layout: One level Home Equipment: Shower seat;Grab bars - toilet;Grab bars - tub/shower;Rollator (4 wheels)      Prior Function Prior Level of Function : Independent/Modified Independent;Driving             Mobility Comments: using cane recently ADLs Comments: Ind with ADLs. IADLs, shopping, cooking, home mgt     Extremity/Trunk Assessment   Upper Extremity Assessment Upper Extremity Assessment: Overall  WFL for tasks assessed    Lower Extremity Assessment Lower Extremity Assessment: Overall WFL for tasks assessed    Cervical / Trunk Assessment Cervical / Trunk Assessment: Normal  Communication   Communication Communication: No apparent difficulties;Impaired    Cognition Arousal: Alert Behavior During Therapy: WFL for tasks assessed/performed   PT - Cognitive impairments: No apparent impairments                         Following commands: Intact       Cueing       General Comments      Exercises      Assessment/Plan    PT Assessment Patient needs continued PT services  PT Problem List Decreased activity tolerance;Decreased mobility;Cardiopulmonary status limiting activity       PT Treatment Interventions DME instruction;Gait training;Functional mobility training;Therapeutic activities;Therapeutic exercise;Patient/family education    PT Goals (Current goals can be found in the Care Plan section)  Acute Rehab PT Goals Patient Stated Goal: home PT Goal Formulation: With patient/family Time For Goal Achievement: 11/22/24 Potential to Achieve Goals: Good    Frequency Min 3X/week     Co-evaluation               AM-PAC PT 6 Clicks Mobility  Outcome Measure Help needed turning from your back to your side while in a flat bed without using bedrails?: None Help needed moving from lying on your back to sitting on the side of a flat bed without using bedrails?: None Help needed moving to and from a bed to a chair (including a wheelchair)?: A Little Help needed standing up from a chair using your arms (e.g., wheelchair or bedside chair)?: A Little Help needed to walk in hospital room?: A Little Help needed climbing 3-5 steps with a railing? : A Little 6 Click Score: 20    End of Session   Activity Tolerance: Patient tolerated treatment well Patient left: in chair;with call bell/phone within reach;with chair alarm set;with family/visitor present Nurse Communication: Mobility status PT Visit Diagnosis: Unsteadiness on feet (R26.81);Difficulty in walking, not elsewhere classified (R26.2)    Time: 8840-8771 PT Time Calculation (min) (ACUTE ONLY): 29 min   Charges:   PT Evaluation $PT Eval Low Complexity: 1 Low PT Treatments $Gait Training: 8-22 mins PT General Charges $$ ACUTE PT VISIT: 1 Visit         Darice Potters PT Acute Rehabilitation Services Office [phone]   Potters Darice Norris 11/08/2024, 3:25 PM

## 2024-11-08 NOTE — Progress Notes (Signed)
 PROGRESS NOTE    Destiny Brown  FMW:[id number] DOB: 05-Dec-1946 DOA: 11/06/2024 PCP: Nichole Senior, MD   Brief Narrative:  Destiny Brown is a 78 y.o. female with medical history significant of Afib, HTN, DM2, CKD 4, and OSA p/w cough/SOB and found to have CAP and HFpEF exacerbation.  Assessment & Plan:   Principal Problem:   CAP (community acquired pneumonia)  Acute hypoxic respiratory failure, multifactorial, POA  - Patient on room air at baseline, currently requiring 5 L nasal cannula to maintain sats even at rest, likely concurrent secondary to volume overload in the setting of heart failure exacerbation as well as community-acquired pneumonia.  HFpEF exacerbation - Lasix  on hold given elevated creatinine as below, follow I's and O's   AKI on CKD 4 - Secondary to diuretics, follow closely, resume as appropriate -hold further nephrotoxic medications - Creatinine bumped to 2.6 - Baseline creatinine around 1.2-1.3 over the past year  Hypervolemic hyponatremia - Improving with diuretics, follow repeat labs  Community acquired pneumonia, POA -Patient does not meet sepsis criteria -Continue ceftriaxone azithromycin  -Continue nebs as needed, wean oxygen as appropriate   Afib - Continue amiodarone , Eliquis    Hypothyroid - Continue prior Synthroid  -repeat testing outpatient setting is appropriate   OSA -CPAP at bedtime  DVT prophylaxis: SCDs Start: 11/06/24 1311 apixaban  (ELIQUIS ) tablet 5 mg  Code Status:   Code Status: Full Code Family Communication: At bedside  Status is: Inpatient  Dispo: The patient is from: Home              Anticipated d/c is to: Home              Anticipated d/c date is: 24 to 48 hours              Patient currently not medically stable for discharge  Consultants:  None  Procedures:  None  Antimicrobials:  Ceftriaxone, azithromycin   Subjective: No acute issues or events overnight, respiratory status improving but not yet back  to baseline, denies chest pain headache fevers chills nausea vomiting diarrhea or constipation  Objective: Vitals:   11/07/24 1811 11/07/24 1812 11/07/24 1936 11/08/24 0527  BP:   (!) 143/89 138/64  Pulse: 67 66 60 60  Resp:  20 17 19   Temp:   99.3 F (37.4 C) 97.9 F (36.6 C)  TempSrc:   Oral   SpO2: (!) 83% 95% 97% 97%  Weight:      Height:        Intake/Output Summary (Last 24 hours) at 11/08/2024 0738 Last data filed at 11/08/2024 0326 Gross per 24 hour  Intake 580 ml  Output 2650 ml  Net -2070 ml   Filed Weights   11/06/24 0921  Weight: 86.2 kg    Examination:  General:  Pleasantly resting in bed, No acute distress. HEENT:  Normocephalic atraumatic.  Sclerae nonicteric, noninjected.  Extraocular movements intact bilaterally. Neck:  Without mass or deformity.  Trachea is midline. Lungs: Bibasilar rales without overt rhonchi or wheeze. Heart:  Regular rate and rhythm.  Without murmurs, rubs, or gallops. Abdomen:  Soft, nontender, nondistended.  Without guarding or rebound. Extremities: Without cyanosis, clubbing, 1+ pitting edema bilateral lower extremities. Skin:  Warm and dry, no erythema.  Data Reviewed: I have personally reviewed following labs and imaging studies  CBC: Recent Labs  Lab 11/06/24 0950 11/06/24 1002  WBC 7.3  --   NEUTROABS 5.7  --   HGB 10.8* 11.6*  11.9*  HCT 34.2* 34.0*  35.0*  MCV 81.0  --   PLT 178  --    Basic Metabolic Panel: Recent Labs  Lab 11/06/24 0950 11/06/24 1002 11/08/24 0404  NA 129* 128*  128* 132*  K 4.0 4.0  4.0 3.4*  CL 96* 98 94*  CO2 18*  --  25  GLUCOSE 149* 161* 104*  BUN 20 21 35*  CREATININE 1.82* 1.90* 2.63*  CALCIUM  9.6  --  9.5   GFR: Estimated Creatinine Clearance: 18.8 mL/min (A) (by C-G formula based on SCr of 2.63 mg/dL (H)). Liver Function Tests: Recent Labs  Lab 11/06/24 0950  AST 28  ALT 16  ALKPHOS 80  BILITOT 1.1  PROT 7.7  ALBUMIN  3.9   No results for input(s): LIPASE,  AMYLASE in the last 168 hours. No results for input(s): AMMONIA in the last 168 hours. Coagulation Profile: No results for input(s): INR, PROTIME in the last 168 hours. Cardiac Enzymes: No results for input(s): CKTOTAL, CKMB, CKMBINDEX, TROPONINI in the last 168 hours. BNP (last 3 results) No results for input(s): PROBNP in the last 8760 hours. HbA1C: No results for input(s): HGBA1C in the last 72 hours. CBG: No results for input(s): GLUCAP in the last 168 hours. Lipid Profile: No results for input(s): CHOL, HDL, LDLCALC, TRIG, CHOLHDL, LDLDIRECT in the last 72 hours. Thyroid  Function Tests: No results for input(s): TSH, T4TOTAL, FREET4, T3FREE, THYROIDAB in the last 72 hours. Anemia Panel: No results for input(s): VITAMINB12, FOLATE, FERRITIN, TIBC, IRON, RETICCTPCT in the last 72 hours. Sepsis Labs: Recent Labs  Lab 11/06/24 1002 11/06/24 1137 11/06/24 1146  PROCALCITON  --  <0.10  --   LATICACIDVEN 1.2  --  1.1    Recent Results (from the past 240 hours)  Resp panel by RT-PCR (RSV, Flu A&B, Covid) Anterior Nasal Swab     Status: None   Collection Time: 11/06/24  9:38 AM   Specimen: Anterior Nasal Swab  Result Value Ref Range Status   SARS Coronavirus 2 by RT PCR NEGATIVE NEGATIVE Final   Influenza A by PCR NEGATIVE NEGATIVE Final   Influenza B by PCR NEGATIVE NEGATIVE Final    Comment: (NOTE) The Xpert Xpress SARS-CoV-2/FLU/RSV plus assay is intended as an aid in the diagnosis of influenza from Nasopharyngeal swab specimens and should not be used as a sole basis for treatment. Nasal washings and aspirates are unacceptable for Xpert Xpress SARS-CoV-2/FLU/RSV testing.  Fact Sheet for Patients: bloggercourse.com  Fact Sheet for Healthcare Providers: seriousbroker.it  This test is not yet approved or cleared by the [place]  FDA and has been authorized for  detection and/or diagnosis of SARS-CoV-2 by FDA under an Emergency Use Authorization (EUA). This EUA will remain in effect (meaning this test can be used) for the duration of the COVID-19 declaration under Section 564(b)(1) of the Act, 21 U.S.C. section 360bbb-3(b)(1), unless the authorization is terminated or revoked.     Resp Syncytial Virus by PCR NEGATIVE NEGATIVE Final    Comment: (NOTE) Fact Sheet for Patients: bloggercourse.com  Fact Sheet for Healthcare Providers: seriousbroker.it  This test is not yet approved or cleared by the [place]  FDA and has been authorized for detection and/or diagnosis of SARS-CoV-2 by FDA under an Emergency Use Authorization (EUA). This EUA will remain in effect (meaning this test can be used) for the duration of the COVID-19 declaration under Section 564(b)(1) of the Act, 21 U.S.C. section 360bbb-3(b)(1), unless the authorization is terminated or revoked.  Performed at [facility], [address], [place], [place] [address]   Culture, blood (routine x 2)     Status: None (Preliminary result)   Collection Time: 11/06/24  9:50 AM   Specimen: BLOOD  Result Value Ref Range Status   Specimen Description BLOOD RIGHT ANTECUBITAL  Final   Special Requests   Final    BOTTLES DRAWN AEROBIC AND ANAEROBIC Blood Culture results may not be optimal due to an inadequate volume of blood received in culture bottles   Culture   Final    NO GROWTH < 24 HOURS Performed at [facility], [address] [place], [place] [address]    Report Status PENDING  Incomplete  Culture, blood (routine x 2)     Status: None (Preliminary result)   Collection Time: 11/06/24  9:51 AM   Specimen: BLOOD  Result Value Ref Range Status   Specimen Description BLOOD LEFT ANTECUBITAL  Final   Special Requests   Final    BOTTLES DRAWN AEROBIC AND ANAEROBIC Blood Culture adequate volume   Culture   Final    NO  GROWTH < 24 HOURS Performed at [facility], [address], [place], [place] [address]    Report Status PENDING  Incomplete         Radiology Studies: CT CHEST WO CONTRAST Result Date: 11/06/2024 CLINICAL DATA:  Respiratory illness. EXAM: CT CHEST WITHOUT CONTRAST TECHNIQUE: Multidetector CT imaging of the chest was performed following the standard protocol without IV contrast. RADIATION DOSE REDUCTION: This exam was performed according to the departmental dose-optimization program which includes automated exposure control, adjustment of the mA and/or kV according to patient size and/or use of iterative reconstruction technique. COMPARISON:  Chest radiograph 11/06/2024. FINDINGS: Cardiovascular: Atherosclerotic calcification of the aorta, aortic valve and coronary arteries. Enlarged pulmonic trunk and heart. No pericardial effusion. Mediastinum/Nodes: No pathologically enlarged mediastinal or axillary lymph nodes. Hilar regions are difficult to definitively evaluate without IV contrast. Esophagus is grossly unremarkable. Lungs/Pleura: Image quality is degraded by expiratory phase imaging and respiratory motion. Scattered peribronchovascular ground-glass. Minimal dependent atelectasis in the right lower lobe. No pleural fluid. Airway is otherwise unremarkable. Upper Abdomen: Cholecystectomy. Tiny hiatal hernia. Visualized portions of the liver, adrenal glands, left kidney, spleen, pancreas, stomach and bowel are otherwise grossly unremarkable. No upper abdominal adenopathy. Musculoskeletal: Degenerative changes in the spine. Partially imaged lumbar fusion. Spinal stimulator wires. Right shoulder arthroplasty. IMPRESSION: 1. Scattered peribronchovascular ground-glass, indicative of bronchopneumonia. 2. Aortic atherosclerosis (ICD10-I70.0). Coronary artery calcification. 3. Enlarged pulmonic trunk, indicative of pulmonary arterial hypertension. Electronically Signed   By: Newell Eke M.D.   On:  11/06/2024 13:00   DG Chest Port 1 View Result Date: 11/06/2024 EXAM: 1 VIEW(S) XRAY OF THE CHEST 11/06/2024 10:02:38 AM COMPARISON: 06/04/2024 CLINICAL HISTORY: sob. Cough with shortness of breath, weakness, and hypoxemia for several weeks. FINDINGS: LINES, TUBES AND DEVICES: Spinal stimulator in place. Partially seen anterior cervical discectomy and fusion hardware. Right shoulder reverse arthroplasty. LUNGS AND PLEURA: No focal pulmonary opacity. No pulmonary edema. No pleural effusion. No pneumothorax. HEART AND MEDIASTINUM: Stable mild cardiomegaly. Aortic arch calcification. BONES AND SOFT TISSUES: No acute osseous abnormality. IMPRESSION: 1. No acute cardiopulmonary findings. 2. Stable mild cardiomegaly. Electronically signed by: Elsie Perone MD 11/06/2024 11:04 AM EST RP Workstation: [id number]        Scheduled Meds:  amiodarone   100 mg Oral Daily   amLODipine   5 mg Oral Daily   apixaban   5 mg Oral BID   azithromycin   500 mg Oral Daily  isosorbide  mononitrate  60 mg Oral Daily   levothyroxine   137 mcg Oral q morning   pregabalin   50 mg Oral Daily   rosuvastatin   5 mg Oral Once per day on Monday Thursday   Continuous Infusions:  cefTRIAXone (ROCEPHIN)  IV Stopped (11/07/24 1054)     LOS: 2 days   Time spent:  Elsie JAYSON [place], DO [facility]  If 7PM-7AM, please contact night-coverage [email or web address]  11/08/2024, 7:38 AM

## 2024-11-08 NOTE — Plan of Care (Signed)

## 2024-11-08 NOTE — Progress Notes (Signed)
 Telemetry box 27 removed per order and returned to the desk.

## 2024-11-08 NOTE — Progress Notes (Signed)
   11/08/24 2049  BiPAP/CPAP/SIPAP  Reason BIPAP/CPAP not in use Non-compliant (pt refuses CPAP)  BiPAP/CPAP /SiPAP Vitals  Temp 97.6 F (36.4 C)  Pulse Rate 60  Resp 18  BP (!) 135/55  SpO2 94 %  MEWS Score/Color  MEWS Score 0  MEWS Score Color Green

## 2024-11-08 NOTE — TOC Progression Note (Signed)
 Transition of Care [facility]) - Progression Note   Patient Details  Name: Destiny Brown MRN: [id number] Date of Birth: 1946/01/31  [facility]) CM/SW Contact  Duwaine GORMAN Aran, LCSW Phone Number: 11/08/2024, 3:10 PM  Clinical Narrative: PT evaluation recommended HH. Patient is agreeable to being faxed out for HHPT. CSW made [facility] referral in hub. Care management awaiting offers.  Expected Discharge Plan: Home w Home Health Services Barriers to Discharge: Continued Medical Work up  Expected Discharge Plan and Services In-house Referral: Clinical Social Work Post Acute Care Choice: Home Health Living arrangements for the past 2 months: Single Family Home           DME Arranged: N/A DME Agency: NA  Social Drivers of Health (SDOH) Interventions SDOH Screenings   Food Insecurity: No Food Insecurity (11/06/2024)  Housing: Low Risk  (11/06/2024)  Transportation Needs: No Transportation Needs (11/06/2024)  Utilities: Not At Risk (11/06/2024)  Alcohol Screen: Low Risk  (06/05/2024)  Depression (PHQ2-9): Low Risk  (08/04/2023)  Financial Resource Strain: Low Risk  (06/05/2024)  Social Connections: Socially Integrated (11/06/2024)  Tobacco Use: Low Risk  (11/06/2024)   Readmission Risk Interventions     No data to display

## 2024-11-08 NOTE — Progress Notes (Signed)
 SATURATION QUALIFICATIONS: (This note is used to comply with regulatory documentation for home oxygen)  Patient Saturations on Room Air at Rest = 95%  Patient Saturations on Room Air while Ambulating = 95%  Patient Saturations on  Liters of oxygen while Ambulating = % NT  Please briefly explain why patient needs home oxygen: oxygenation remained greater then 88 % and does not require supplemental oxygen.  Darice Potters PT Acute Rehabilitation Services Office [phone]

## 2024-11-09 ENCOUNTER — Encounter (HOSPITAL_COMMUNITY): Payer: Self-pay | Admitting: Nephrology

## 2024-11-09 ENCOUNTER — Other Ambulatory Visit (HOSPITAL_COMMUNITY): Payer: Self-pay

## 2024-11-09 DIAGNOSIS — J189 Pneumonia, unspecified organism: Secondary | ICD-10-CM | POA: Diagnosis not present

## 2024-11-09 LAB — BASIC METABOLIC PANEL WITH GFR
Anion gap: 11 (ref 5–15)
BUN: 37 mg/dL — ABNORMAL HIGH (ref 8–23)
CO2: 26 mmol/L (ref 22–32)
Calcium: 9.2 mg/dL (ref 8.9–10.3)
Chloride: 93 mmol/L — ABNORMAL LOW (ref 98–111)
Creatinine, Ser: 2.1 mg/dL — ABNORMAL HIGH (ref 0.44–1.00)
GFR, Estimated: 23 mL/min — ABNORMAL LOW (ref >60)
Glucose, Bld: 110 mg/dL — ABNORMAL HIGH (ref 70–99)
Potassium: 3.6 mmol/L (ref 3.5–5.1)
Sodium: 129 mmol/L — ABNORMAL LOW (ref 135–145)

## 2024-11-09 MED ORDER — FUROSEMIDE 20 MG PO TABS
20.0000 mg | ORAL_TABLET | Freq: Every day | ORAL | 0 refills | Status: DC | PRN
  Filled 2024-11-09: qty 30, 30d supply, fill #0

## 2024-11-09 MED ORDER — ROBAFEN DM 20-200 MG/20ML PO LIQD
5.0000 mL | ORAL | 0 refills | Status: AC | PRN
  Filled 2024-11-09: qty 118, 7d supply, fill #0

## 2024-11-09 MED ORDER — BENZONATATE 100 MG PO CAPS
100.0000 mg | ORAL_CAPSULE | Freq: Three times a day (TID) | ORAL | 0 refills | Status: AC | PRN
  Filled 2024-11-09: qty 21, 7d supply, fill #0

## 2024-11-09 MED ORDER — CEPHALEXIN 500 MG PO CAPS
500.0000 mg | ORAL_CAPSULE | Freq: Four times a day (QID) | ORAL | 0 refills | Status: AC
  Filled 2024-11-09: qty 6, 2d supply, fill #0

## 2024-11-09 MED ORDER — FUROSEMIDE 20 MG PO TABS
20.0000 mg | ORAL_TABLET | Freq: Every day | ORAL | Status: DC
  Administered 2024-11-09: 20 mg via ORAL
  Filled 2024-11-09: qty 1

## 2024-11-09 MED ORDER — BENZONATATE 100 MG PO CAPS
100.0000 mg | ORAL_CAPSULE | Freq: Once | ORAL | Status: AC
  Administered 2024-11-09: 100 mg via ORAL
  Filled 2024-11-09: qty 1

## 2024-11-09 NOTE — Plan of Care (Signed)

## 2024-11-09 NOTE — Discharge Summary (Signed)
 Physician Discharge Summary  Destiny Brown FMW:[id number] DOB: 1946/02/09 DOA: 11/06/2024  PCP: Nichole Senior, MD  Admit date: 11/06/2024 Discharge date: 11/09/2024  Admitted From: Home Disposition: Home  Recommendations for Outpatient Follow-up:  Follow up with PCP in 1-2 weeks Follow-up with cardiology and nephrology as scheduled  Home Health: None Equipment/Devices: None  Discharge Condition: Stable CODE STATUS: Full Diet recommendation: Low-salt low-fat low-carb diet  Brief/Interim Summary: Destiny Brown is a 78 y.o. female with medical history significant of Afib, HTN, DM2, CKD 4, and OSA p/w cough/SOB and found to have CAP and HFpEF exacerbation.  Patient admitted as above with acute hypoxic respiratory failure secondary to commune acquired pneumonia and what appears to be provoked preserved ejection fraction heart failure exacerbation.  Patient diuresed quite well, improving on antibiotics and at this time, other than cough, has no overt complaints denies any further shortness of breath dyspnea or weakness.  Lengthy discussion in regards to patient's medical management, she understands that ongoing diuretic use and careful monitoring of her dietary salt and fluid intake is paramount to maintaining euvolemia.  At this time we will transition patient to both scheduled daily furosemide  as well as as needed furosemide  as outlined below for any weight gain as previously prescribed by cardiology.  Renal function downtrending appropriately, will benefit from close monitoring outpatient, recommend repeat BMP within 1 to 2 weeks to ensure appropriate creatinine and potassium levels as patient's diuretics/diet continue to be adjusted.  She is otherwise stable and agreeable for discharge home, continue antibiotics for additional 2 days to complete course.   Discharge Diagnoses:  Principal Problem:   CAP (community acquired pneumonia)  Acute hypoxic respiratory failure,  multifactorial, POA, resolved - Patient on room air at baseline, initially requiring upwards of 5 L in the setting of heart failure exacerbation as well as community-acquired pneumonia.  Now on room air with exertion and at rest, no indication for supplemental oxygen ongoing   HFpEF exacerbation - Continue home Lasix  as needed per cardiology, start scheduled low-dose Lasix  as above, creatinine downtrending appropriately   AKI on CKD 4 - Downtrending appropriately, follow-up repeat creatinine in 1 to 2 weeks with PCP and/or cardiology - Baseline creatinine around 1.2-1.3 over the past year   Hypervolemic hyponatremia - Improving with diuretics, follow repeat labs   Community acquired pneumonia, POA -Patient does not meet sepsis criteria -Continue ceftriaxone azithromycin  -Continue nebs as needed, wean oxygen as appropriate   Afib - Continue amiodarone , Eliquis    Hypothyroid - Continue prior Synthroid  -repeat testing outpatient setting is appropriate   OSA -CPAP at bedtime  Discharge Instructions  Discharge Instructions     (HEART FAILURE PATIENTS) Call MD:  Anytime you have any of the following symptoms: 1) 3 pound weight gain in 24 hours or 5 pounds in 1 week 2) shortness of breath, with or without a dry hacking cough 3) swelling in the hands, feet or stomach 4) if you have to sleep on extra pillows at night in order to breathe.   Complete by: As directed    Call MD for:  difficulty breathing, headache or visual disturbances   Complete by: As directed    Call MD for:  extreme fatigue   Complete by: As directed    Call MD for:  hives   Complete by: As directed    Call MD for:  persistant dizziness or light-headedness   Complete by: As directed    Call MD for:  persistant nausea and vomiting  Complete by: As directed    Call MD for:  severe uncontrolled pain   Complete by: As directed    Call MD for:  temperature >100.4   Complete by: As directed    Diet - low sodium  heart healthy   Complete by: As directed    Increase activity slowly   Complete by: As directed    No wound care   Complete by: As directed       Allergies as of 11/09/2024       Reactions   Oxycodone Other (See Comments)   Severe headaches, hallucinations    Codeine Other (See Comments)   Severe Headaches with codeine; but not with other opioids        Medication List     TAKE these medications    albuterol  108 (90 Base) MCG/ACT inhaler Commonly known as: VENTOLIN  HFA Inhale 2 puffs into the lungs every 6 (six) hours as needed for wheezing or shortness of breath (Cough).   amiodarone  100 MG tablet Commonly known as: Pacerone  Take 1 tablet (100 mg total) by mouth daily.   amLODipine  5 MG tablet Commonly known as: NORVASC  Take 1 tablet (5 mg total) by mouth daily.   apixaban  5 MG Tabs tablet Commonly known as: ELIQUIS  Take 1 tablet (5 mg total) by mouth 2 (two) times daily.   benzonatate  100 MG capsule Commonly known as: TESSALON  Take 1 capsule (100 mg total) by mouth 3 (three) times daily as needed for up to 7 days for cough.   cephALEXin 500 MG capsule Commonly known as: KEFLEX Take 1 capsule (500 mg total) by mouth 4 (four) times daily for 6 doses.   empagliflozin  10 MG Tabs tablet Commonly known as: Jardiance  Take 1 tablet (10 mg total) by mouth daily before breakfast.   [employer] Vitamin B-12 500 MCG tablet Generic drug: cyanocobalamin Take 500 mcg by mouth daily.   furosemide  20 MG tablet Commonly known as: LASIX  Take 1 tablet (20 mg total) by mouth daily as needed for edema or fluid. As needed in case of weight gain 1 to 2 lbs in 24 hrs or 5 lbs in 7 days. What changed:  medication strength how much to take   guaiFENesin -dextromethorphan 100-10 MG/5ML syrup Commonly known as: ROBITUSSIN DM Take 5 mLs by mouth every 4 (four) hours as needed for cough.   hydrALAZINE  50 MG tablet Commonly known as: APRESOLINE  Take 1 tablet (50 mg total)  by mouth 3 (three) times daily.   iron polysaccharides 150 MG capsule Commonly known as: NIFEREX Take 150 mg by mouth daily.   isosorbide  mononitrate 60 MG 24 hr tablet Commonly known as: IMDUR  Take 1 tablet (60 mg total) by mouth daily.   latanoprost  0.005 % ophthalmic solution Commonly known as: XALATAN  Place 1 drop into both eyes at bedtime.   levothyroxine  137 MCG tablet Commonly known as: SYNTHROID  Take 137 mcg by mouth every morning.   pregabalin  50 MG capsule Commonly known as: LYRICA  Take 1 capsule (50 mg total) by mouth daily.   rosuvastatin  5 MG tablet Commonly known as: CRESTOR  Take 5 mg by mouth 2 (two) times a week. Mondays & Thursdays.   Vitamin D3 25 MCG (1000 UT) Caps Take 1,000 Units by mouth in the morning.        Contact information for after-discharge care     Home Medical Care     [facility] .   Service: Home Health Services  Allergies  Allergen Reactions   Oxycodone Other (See Comments)    Severe headaches, hallucinations    Codeine Other (See Comments)    Severe Headaches with codeine; but not with other opioids    Consultations: None  Procedures/Studies: CT CHEST WO CONTRAST Result Date: 11/06/2024 CLINICAL DATA:  Respiratory illness. EXAM: CT CHEST WITHOUT CONTRAST TECHNIQUE: Multidetector CT imaging of the chest was performed following the standard protocol without IV contrast. RADIATION DOSE REDUCTION: This exam was performed according to the departmental dose-optimization program which includes automated exposure control, adjustment of the mA and/or kV according to patient size and/or use of iterative reconstruction technique. COMPARISON:  Chest radiograph 11/06/2024. FINDINGS: Cardiovascular: Atherosclerotic calcification of the aorta, aortic valve and coronary arteries. Enlarged pulmonic trunk and heart. No pericardial effusion. Mediastinum/Nodes: No pathologically enlarged  mediastinal or axillary lymph nodes. Hilar regions are difficult to definitively evaluate without IV contrast. Esophagus is grossly unremarkable. Lungs/Pleura: Image quality is degraded by expiratory phase imaging and respiratory motion. Scattered peribronchovascular ground-glass. Minimal dependent atelectasis in the right lower lobe. No pleural fluid. Airway is otherwise unremarkable. Upper Abdomen: Cholecystectomy. Tiny hiatal hernia. Visualized portions of the liver, adrenal glands, left kidney, spleen, pancreas, stomach and bowel are otherwise grossly unremarkable. No upper abdominal adenopathy. Musculoskeletal: Degenerative changes in the spine. Partially imaged lumbar fusion. Spinal stimulator wires. Right shoulder arthroplasty. IMPRESSION: 1. Scattered peribronchovascular ground-glass, indicative of bronchopneumonia. 2. Aortic atherosclerosis (ICD10-I70.0). Coronary artery calcification. 3. Enlarged pulmonic trunk, indicative of pulmonary arterial hypertension. Electronically Signed   By: Newell Eke M.D.   On: 11/06/2024 13:00   DG Chest Port 1 View Result Date: 11/06/2024 EXAM: 1 VIEW(S) XRAY OF THE CHEST 11/06/2024 10:02:38 AM COMPARISON: 06/04/2024 CLINICAL HISTORY: sob. Cough with shortness of breath, weakness, and hypoxemia for several weeks. FINDINGS: LINES, TUBES AND DEVICES: Spinal stimulator in place. Partially seen anterior cervical discectomy and fusion hardware. Right shoulder reverse arthroplasty. LUNGS AND PLEURA: No focal pulmonary opacity. No pulmonary edema. No pleural effusion. No pneumothorax. HEART AND MEDIASTINUM: Stable mild cardiomegaly. Aortic arch calcification. BONES AND SOFT TISSUES: No acute osseous abnormality. IMPRESSION: 1. No acute cardiopulmonary findings. 2. Stable mild cardiomegaly. Electronically signed by: Elsie Perone MD 11/06/2024 11:04 AM EST RP Workstation: [id number]     Subjective: No acute issues or events overnight, respiratory status improving,  continues to have cough but generally improving from prior otherwise denies nausea vomiting diarrhea constipation any fevers chills or chest pain   Discharge Exam: Vitals:   11/09/24 0847 11/09/24 1307  BP: (!) 138/45 (!) 147/56  Pulse: (!) 56 (!) 55  Resp: 17 18  Temp: 98 F (36.7 C) 98.1 F (36.7 C)  SpO2: 95% 96%   Vitals:   11/08/24 2219 11/09/24 0457 11/09/24 0847 11/09/24 1307  BP:  (!) 139/48 (!) 138/45 (!) 147/56  Pulse:  (!) 57 (!) 56 (!) 55  Resp:  18 17 18   Temp:  97.8 F (36.6 C) 98 F (36.7 C) 98.1 F (36.7 C)  TempSrc:   Oral Oral  SpO2: 95% (!) 88% 95% 96%  Weight:      Height:        General: Pt is alert, awake, not in acute distress Cardiovascular: RRR, S1/S2 +, no rubs, no gallops Respiratory: Diminished bilaterally without overt wheeze or rales, scant upper airway noise noted Abdominal: Soft, NT, ND, bowel sounds + Extremities: no edema, no cyanosis    The results of significant diagnostics from this hospitalization (including imaging, microbiology, ancillary and laboratory) are listed  below for reference.     Microbiology: Recent Results (from the past 240 hours)  Resp panel by RT-PCR (RSV, Flu A&B, Covid) Anterior Nasal Swab     Status: None   Collection Time: 11/06/24  9:38 AM   Specimen: Anterior Nasal Swab  Result Value Ref Range Status   SARS Coronavirus 2 by RT PCR NEGATIVE NEGATIVE Final   Influenza A by PCR NEGATIVE NEGATIVE Final   Influenza B by PCR NEGATIVE NEGATIVE Final    Comment: (NOTE) The Xpert Xpress SARS-CoV-2/FLU/RSV plus assay is intended as an aid in the diagnosis of influenza from Nasopharyngeal swab specimens and should not be used as a sole basis for treatment. Nasal washings and aspirates are unacceptable for Xpert Xpress SARS-CoV-2/FLU/RSV testing.  Fact Sheet for Patients: bloggercourse.com  Fact Sheet for Healthcare Providers: seriousbroker.it  This test is  not yet approved or cleared by the [place]  FDA and has been authorized for detection and/or diagnosis of SARS-CoV-2 by FDA under an Emergency Use Authorization (EUA). This EUA will remain in effect (meaning this test can be used) for the duration of the COVID-19 declaration under Section 564(b)(1) of the Act, 21 U.S.C. section 360bbb-3(b)(1), unless the authorization is terminated or revoked.     Resp Syncytial Virus by PCR NEGATIVE NEGATIVE Final    Comment: (NOTE) Fact Sheet for Patients: bloggercourse.com  Fact Sheet for Healthcare Providers: seriousbroker.it  This test is not yet approved or cleared by the [place]  FDA and has been authorized for detection and/or diagnosis of SARS-CoV-2 by FDA under an Emergency Use Authorization (EUA). This EUA will remain in effect (meaning this test can be used) for the duration of the COVID-19 declaration under Section 564(b)(1) of the Act, 21 U.S.C. section 360bbb-3(b)(1), unless the authorization is terminated or revoked.  Performed at [facility], [address], [place], [place] [address]   Culture, blood (routine x 2)     Status: None (Preliminary result)   Collection Time: 11/06/24  9:50 AM   Specimen: BLOOD  Result Value Ref Range Status   Specimen Description BLOOD RIGHT ANTECUBITAL  Final   Special Requests   Final    BOTTLES DRAWN AEROBIC AND ANAEROBIC Blood Culture results may not be optimal due to an inadequate volume of blood received in culture bottles   Culture   Final    NO GROWTH 3 DAYS Performed at [facility], [address] [place], [place] [address]    Report Status PENDING  Incomplete  Culture, blood (routine x 2)     Status: None (Preliminary result)   Collection Time: 11/06/24  9:51 AM   Specimen: BLOOD  Result Value Ref Range Status   Specimen Description BLOOD LEFT ANTECUBITAL  Final   Special Requests   Final    BOTTLES DRAWN  AEROBIC AND ANAEROBIC Blood Culture adequate volume   Culture   Final    NO GROWTH 3 DAYS Performed at [facility], [address], [place], [place] [address]    Report Status PENDING  Incomplete     Labs: BNP (last 3 results) Recent Labs    06/02/24 1936 07/10/24 0950 11/06/24 0950  BNP 1,135.6* 212.8* 330.2*   Basic Metabolic Panel: Recent Labs  Lab 11/06/24 0950 11/06/24 1002 11/08/24 0404 11/09/24 0413  NA 129* 128*  128* 132* 129*  K 4.0 4.0  4.0 3.4* 3.6  CL 96* 98 94* 93*  CO2 18*  --  25 26  GLUCOSE  149* 161* 104* 110*  BUN 20 21 35* 37*  CREATININE 1.82* 1.90* 2.63* 2.10*  CALCIUM  9.6  --  9.5 9.2   Liver Function Tests: Recent Labs  Lab 11/06/24 0950  AST 28  ALT 16  ALKPHOS 80  BILITOT 1.1  PROT 7.7  ALBUMIN  3.9   No results for input(s): LIPASE, AMYLASE in the last 168 hours. No results for input(s): AMMONIA in the last 168 hours. CBC: Recent Labs  Lab 11/06/24 0950 11/06/24 1002  WBC 7.3  --   NEUTROABS 5.7  --   HGB 10.8* 11.6*  11.9*  HCT 34.2* 34.0*  35.0*  MCV 81.0  --   PLT 178  --    Cardiac Enzymes: No results for input(s): CKTOTAL, CKMB, CKMBINDEX, TROPONINI in the last 168 hours. BNP: Invalid input(s): POCBNP CBG: No results for input(s): GLUCAP in the last 168 hours. D-Dimer No results for input(s): DDIMER in the last 72 hours. Hgb A1c No results for input(s): HGBA1C in the last 72 hours. Lipid Profile No results for input(s): CHOL, HDL, LDLCALC, TRIG, CHOLHDL, LDLDIRECT in the last 72 hours. Thyroid  function studies No results for input(s): TSH, T4TOTAL, T3FREE, THYROIDAB in the last 72 hours.  Invalid input(s): FREET3 Anemia work up No results for input(s): VITAMINB12, FOLATE, FERRITIN, TIBC, IRON, RETICCTPCT in the last 72 hours. Urinalysis    Component Value Date/Time   COLORURINE YELLOW 11/06/2024 1632   APPEARANCEUR CLEAR 11/06/2024 1632    LABSPEC 1.009 11/06/2024 1632   PHURINE 5.0 11/06/2024 1632   GLUCOSEU >=500 (A) 11/06/2024 1632   HGBUR SMALL (A) 11/06/2024 1632   BILIRUBINUR NEGATIVE 11/06/2024 1632   KETONESUR NEGATIVE 11/06/2024 1632   PROTEINUR 100 (A) 11/06/2024 1632   UROBILINOGEN 0.2 09/21/2014 1836   NITRITE NEGATIVE 11/06/2024 1632   LEUKOCYTESUR NEGATIVE 11/06/2024 1632   Sepsis Labs Recent Labs  Lab 11/06/24 0950  WBC 7.3   Microbiology Recent Results (from the past 240 hours)  Resp panel by RT-PCR (RSV, Flu A&B, Covid) Anterior Nasal Swab     Status: None   Collection Time: 11/06/24  9:38 AM   Specimen: Anterior Nasal Swab  Result Value Ref Range Status   SARS Coronavirus 2 by RT PCR NEGATIVE NEGATIVE Final   Influenza A by PCR NEGATIVE NEGATIVE Final   Influenza B by PCR NEGATIVE NEGATIVE Final    Comment: (NOTE) The Xpert Xpress SARS-CoV-2/FLU/RSV plus assay is intended as an aid in the diagnosis of influenza from Nasopharyngeal swab specimens and should not be used as a sole basis for treatment. Nasal washings and aspirates are unacceptable for Xpert Xpress SARS-CoV-2/FLU/RSV testing.  Fact Sheet for Patients: bloggercourse.com  Fact Sheet for Healthcare Providers: seriousbroker.it  This test is not yet approved or cleared by the [place]  FDA and has been authorized for detection and/or diagnosis of SARS-CoV-2 by FDA under an Emergency Use Authorization (EUA). This EUA will remain in effect (meaning this test can be used) for the duration of the COVID-19 declaration under Section 564(b)(1) of the Act, 21 U.S.C. section 360bbb-3(b)(1), unless the authorization is terminated or revoked.     Resp Syncytial Virus by PCR NEGATIVE NEGATIVE Final    Comment: (NOTE) Fact Sheet for Patients: bloggercourse.com  Fact Sheet for Healthcare Providers: seriousbroker.it  This test is not  yet approved or cleared by the [place]  FDA and has been authorized for detection and/or diagnosis of SARS-CoV-2 by FDA under an Emergency Use Authorization (EUA). This EUA will remain in effect (  meaning this test can be used) for the duration of the COVID-19 declaration under Section 564(b)(1) of the Act, 21 U.S.C. section 360bbb-3(b)(1), unless the authorization is terminated or revoked.  Performed at [facility], [address] [place], [place] [address]   Culture, blood (routine x 2)     Status: None (Preliminary result)   Collection Time: 11/06/24  9:50 AM   Specimen: BLOOD  Result Value Ref Range Status   Specimen Description BLOOD RIGHT ANTECUBITAL  Final   Special Requests   Final    BOTTLES DRAWN AEROBIC AND ANAEROBIC Blood Culture results may not be optimal due to an inadequate volume of blood received in culture bottles   Culture   Final    NO GROWTH 3 DAYS Performed at [facility], [address] [place], [place] [address]    Report Status PENDING  Incomplete  Culture, blood (routine x 2)     Status: None (Preliminary result)   Collection Time: 11/06/24  9:51 AM   Specimen: BLOOD  Result Value Ref Range Status   Specimen Description BLOOD LEFT ANTECUBITAL  Final   Special Requests   Final    BOTTLES DRAWN AEROBIC AND ANAEROBIC Blood Culture adequate volume   Culture   Final    NO GROWTH 3 DAYS Performed at [facility], [address], [place], [place] [address]    Report Status PENDING  Incomplete     Time coordinating discharge: Over 30 minutes  SIGNED:   Elsie JAYSON Montclair, DO [facility] 11/09/2024, 1:41 PM Pager   If 7PM-7AM, please contact night-coverage [email or web address]

## 2024-11-09 NOTE — Progress Notes (Signed)
 Mobility Specialist - Progress Note   11/09/24 1536  Mobility  Activity Ambulated with assistance  Level of Assistance Standby assist, set-up cues, supervision of patient - no hands on  Assistive Device Front wheel walker  Distance Ambulated (ft) 90 ft  Range of Motion/Exercises Active  Activity Response Tolerated well  Mobility visit 1 Mobility  Mobility Specialist Start Time (ACUTE ONLY) 1517  Mobility Specialist Stop Time (ACUTE ONLY) 1536  Mobility Specialist Time Calculation (min) (ACUTE ONLY) 19 min   Pt requested assistance to bathroom. Afterwards ambulated in hallway and returned to bed with all needs met. Call bell in reach.   Erminio Leos,  Mobility Specialist Can be reached via Secure Chat

## 2024-11-09 NOTE — TOC Transition Note (Signed)
 Transition of Care [facility]) - Discharge Note  Patient Details  Name: Destiny Brown MRN: [id number] Date of Birth: 08-24-1946  [facility]) CM/SW Contact:  Duwaine GORMAN Aran, LCSW Phone Number: 11/09/2024, 1:04 PM  Clinical Narrative: [facility] accepted HHPT referral. CSW updated patient. HH orders placed by hospitalist.  Final next level of care: Home w Home Health Services Barriers to Discharge: Barriers Resolved  Patient Goals and CMS Choice Patient states their goals for this hospitalization and ongoing recovery are:: Get [facility] CMS Medicare.gov Compare Post Acute Care list provided to:: Patient Choice offered to / list presented to : Patient  Discharge Plan and Services Additional resources added to the After Visit Summary for   In-house Referral: Clinical Social Work Post Acute Care Choice: Home Health          DME Arranged: N/A DME Agency: NA HH Arranged: PT HH Agency: [facility] Date HH Agency Contacted: 11/08/24 Representative spoke with at [facility] Agency: Referral made in hub  Social Drivers of Health (SDOH) Interventions SDOH Screenings   Food Insecurity: No Food Insecurity (11/06/2024)  Housing: Low Risk  (11/06/2024)  Transportation Needs: No Transportation Needs (11/06/2024)  Utilities: Not At Risk (11/06/2024)  Alcohol Screen: Low Risk  (06/05/2024)  Depression (PHQ2-9): Low Risk  (08/04/2023)  Financial Resource Strain: Low Risk  (06/05/2024)  Social Connections: Socially Integrated (11/06/2024)  Tobacco Use: Low Risk  (11/06/2024)   Readmission Risk Interventions     No data to display

## 2024-11-09 NOTE — Plan of Care (Signed)
   Problem: Education: Goal: Knowledge of General Education information will improve Description: Including pain rating scale, medication(s)/side effects and non-pharmacologic comfort measures Outcome: Progressing   Problem: Health Behavior/Discharge Planning: Goal: Ability to manage health-related needs will improve Outcome: Progressing   Problem: Clinical Measurements: Goal: Will remain free from infection Outcome: Progressing

## 2024-11-09 NOTE — Progress Notes (Signed)
 Discharge medications delivered to patients Nurse R Puleo RN in a secure bag

## 2024-11-09 NOTE — Progress Notes (Signed)
 Mobility Specialist - Progress Note  (RA) Pre-mobility: 46 bpm HR, 94% SpO2 During mobility: 59 bpm HR, 91% SpO2 Post-mobility: 48 bpm HR, 95% SPO2   11/09/24 1041  Mobility  Activity Ambulated with assistance  Level of Assistance Standby assist, set-up cues, supervision of patient - no hands on  Assistive Device Front wheel walker  Distance Ambulated (ft) 100 ft  Range of Motion/Exercises Active  Activity Response Tolerated well  Mobility visit 1 Mobility  Mobility Specialist Start Time (ACUTE ONLY) 1028  Mobility Specialist Stop Time (ACUTE ONLY) 1041  Mobility Specialist Time Calculation (min) (ACUTE ONLY) 13 min   Pt was found on recliner chair and agreeable to mobilize. Grew fatigued with session. At EOS returned to recliner chair with all needs met. Call bell in reach and chair alarm on.   Erminio Leos,  Mobility Specialist Can be reached via Secure Chat

## 2024-11-09 NOTE — Progress Notes (Signed)
 Pt coughing at start of shift, and continued throughout unable to sleep or rest, pt appear exhausted and tired due to coughing. Robitussin DM administered earlier in shift, ineffective to relieve cough; as patient could be heard coughing out side pt room with door closed. Reached out to NP on call and requested med for cough, addition dose of Robitussin DM administered around 0215 and did not help decrease cough, in addition  NP ordered one time dose Tesselon Pearle cough med (TP). Pt finally settled down and cough improved and decreased with TP.

## 2024-11-10 ENCOUNTER — Other Ambulatory Visit: Payer: Self-pay

## 2024-11-11 LAB — CULTURE, BLOOD (ROUTINE X 2)
Culture: NO GROWTH
Culture: NO GROWTH
Special Requests: ADEQUATE

## 2024-11-14 LAB — LAB REPORT - SCANNED
A1c: 6
EGFR: 25.5

## 2024-11-15 ENCOUNTER — Encounter (HOSPITAL_COMMUNITY)

## 2024-11-15 NOTE — Progress Notes (Signed)
 Cardiology Office Note:    Date:  11/21/2024   ID:  Destiny Brown, DOB July 16, 1946, MRN [id number]  PCP:  Nichole Senior, MD   [facility] Providers Cardiologist:  Jaskirat Zertuche, MD Cardiology APP:  Lelon Glendia DASEN, PA-C  Electrophysiologist:  OLE DASEN HOLTS, MD  Electrophysiology APP:  Leverne Charlies Macario DEVONNA     Referring MD: Nichole Senior, MD   Chief Complaint  Patient presents with   Atrial Fibrillation   Congestive Heart Failure    History of Present Illness:    Destiny Brown is a 78 y.o. female is seen for follow up Afib and CHF. She has a history of HTN, LBBB, OSA., CAD. Had Afib diagnosed in 2015. On OAC. Followed by Dr Fernande. Event monitor in 2022 showed some runs of atrial tachycardia. In April 2024 she had stenting of the LCx. 40-50% LAD stenosis noted at that time.   She was admitted in May with AFib with RVR that occurred when she was undergoing a PET CT. Cardioverted in ED. Had abnormal PET CT leading to repeat cath showing nonobstructive CAD. EF 35% on PET imaging.  She was readmitted in June with CHF and recurrent Afib with RVR. EF 25-30% by Echo. Started on IV amiodarone  with subsequent return to NSR. CHF therapy limited by CKD with worsening creatinine 1.58>> creatinine 2.89. Entresto  and aldactone  discontinued. Repeat on follow up 2.0.  managed with hydralazine  and nitrates.   She had repeat Echo in September showing marked improvement in EF to 50-55%.  She was admitted Nov 10-13 with acute respiratory failure due to CAP. Was given diuretics as there was felt to be a heart failure component.   She does note mild swelling in feet. Breathing is much better. Cough is better. No chest pain. No arrhythmia. Was in NSR throughout her hospital stay.   Past Medical History:  Diagnosis Date   Arthritis    Atrial fibrillation with RVR (HCC) 09/21/2014   Bronchitis    h/o   Diabetes mellitus    NO MEDS,  DIET CONTROLLED   History of migraines     when working under alot of stress   Hypertension    Hypothyroid    LBBB (left bundle branch block) 09/22/2014   OSA (obstructive sleep apnea) 04/09/2015   does not use cpap   PONV (postoperative nausea and vomiting)    PT DOESNOT WANT TO TAKE HER ATENOLOL      DOS.    IT DROPS MY BP TO LOW    Past Surgical History:  Procedure Laterality Date   ABDOMINAL HYSTERECTOMY  03/2001   APPENDECTOMY     BACK SURGERY     CATARACT EXTRACTION, BILATERAL     WITH IMPLANTS   CERVICAL FUSION  08/2003   CHOLECYSTECTOMY  03/2010   COLONOSCOPY WITH PROPOFOL  N/A 08/27/2023   Procedure: COLONOSCOPY WITH PROPOFOL ;  Surgeon: Rollin Dover, MD;  Location: [facility];  Service: Gastroenterology;  Laterality: N/A;   CORONARY STENT INTERVENTION N/A 04/23/2023   Procedure: CORONARY STENT INTERVENTION;  Surgeon: Wonda Sharper, MD;  Location: [facility] INVASIVE CV LAB;  Service: Cardiovascular;  Laterality: N/A;   ESOPHAGOGASTRODUODENOSCOPY N/A 08/27/2023   Procedure: ESOPHAGOGASTRODUODENOSCOPY (EGD);  Surgeon: Rollin Dover, MD;  Location: [facility];  Service: Gastroenterology;  Laterality: N/A;   EYE SURGERY     GIVENS CAPSULE STUDY N/A 11/18/2023   Procedure: GIVENS CAPSULE STUDY;  Surgeon: Rollin Dover, MD;  Location: [facility] ENDOSCOPY;  Service: Gastroenterology;  Laterality: N/A;   LEFT  HEART CATH AND CORONARY ANGIOGRAPHY N/A 04/23/2023   Procedure: LEFT HEART CATH AND CORONARY ANGIOGRAPHY;  Surgeon: Wonda Sharper, MD;  Location: [facility] INVASIVE CV LAB;  Service: Cardiovascular;  Laterality: N/A;   LEFT HEART CATH AND CORONARY ANGIOGRAPHY N/A 05/11/2024   Procedure: LEFT HEART CATH AND CORONARY ANGIOGRAPHY;  Surgeon: Darron Deatrice LABOR, MD;  Location: [facility] INVASIVE CV LAB;  Service: Cardiovascular;  Laterality: N/A;   LUMBAR DISC SURGERY     LUMBAR LAMINECTOMY/DECOMPRESSION MICRODISCECTOMY  10/2010   POSTERIOR FUSION LUMBAR SPINE  12/16/11; 12/2005; 07/2004   L2-3; L3-4; L4-5   REVERSE SHOULDER ARTHROPLASTY Right  02/26/2022   Procedure: REVERSE SHOULDER ARTHROPLASTY;  Surgeon: Dozier Soulier, MD;  Location: [facility];  Service: Orthopedics;  Laterality: Right;   TOTAL KNEE ARTHROPLASTY Right 03/18/2017   Procedure: RIGHT TOTAL KNEE ARTHROPLASTY;  Surgeon: Lamar Collet, MD;  Location: [facility];  Service: Orthopedics;  Laterality: Right;    Current Medications: Current Meds  Medication Sig   albuterol  (VENTOLIN  HFA) 108 (90 Base) MCG/ACT inhaler Inhale 2 puffs into the lungs every 6 (six) hours as needed for wheezing or shortness of breath (Cough).   amiodarone  (PACERONE ) 100 MG tablet Take 1 tablet (100 mg total) by mouth daily.   amLODipine  (NORVASC ) 5 MG tablet Take 1 tablet (5 mg total) by mouth daily.   apixaban  (ELIQUIS ) 5 MG TABS tablet Take 1 tablet (5 mg total) by mouth 2 (two) times daily.   Cholecalciferol  (VITAMIN D3) 1000 UNITS CAPS Take 1,000 Units by mouth in the morning.   cyanocobalamin (FINEST NUTRITION VITAMIN B-12) 500 MCG tablet Take 500 mcg by mouth daily.   empagliflozin  (JARDIANCE ) 10 MG TABS tablet Take 1 tablet (10 mg total) by mouth daily before breakfast.   furosemide  (LASIX ) 20 MG tablet Take 1 tablet (20 mg total) by mouth daily as needed for edema or fluid. As needed in case of weight gain 1 to 2 lbs in 24 hrs or 5 lbs in 7 days.   hydrALAZINE  (APRESOLINE ) 50 MG tablet Take 1 tablet (50 mg total) by mouth 3 (three) times daily.   iron polysaccharides (NIFEREX) 150 MG capsule Take 150 mg by mouth daily.   isosorbide  mononitrate (IMDUR ) 60 MG 24 hr tablet Take 1 tablet (60 mg total) by mouth daily.   latanoprost  (XALATAN ) 0.005 % ophthalmic solution Place 1 drop into both eyes at bedtime.   levothyroxine  (SYNTHROID ) 137 MCG tablet Take 137 mcg by mouth every morning.   pregabalin  (LYRICA ) 50 MG capsule Take 1 capsule (50 mg total) by mouth daily.   rosuvastatin  (CRESTOR ) 5 MG tablet Take 5 mg by mouth 2 (two) times a week. Mondays & Thursdays.     Allergies:   Oxycodone and  Codeine   Social History   Socioeconomic History   Marital status: Married    Spouse name: Pasco   Number of children: 2   Years of education: 13   Highest education level: Not on file  Occupational History   Occupation: part [employer]   Occupation: retired  Tobacco Use   Smoking status: Never   Smokeless tobacco: Never  Vaping Use   Vaping status: Never Used  Substance and Sexual Activity   Alcohol use: No   Drug use: No   Sexual activity: Never  Other Topics Concern   Not on file  Social History Narrative   Patient drinks about 2-3 cups of caffeine daily.   Patient is right handed.    Social Drivers of Health  Financial Resource Strain: Low Risk  (06/05/2024)   Overall Financial Resource Strain (CARDIA)    Difficulty of Paying Living Expenses: Not hard at all  Food Insecurity: No Food Insecurity (11/06/2024)   Hunger Vital Sign    Worried About Running Out of Food in the Last Year: Never true    Ran Out of Food in the Last Year: Never true  Transportation Needs: No Transportation Needs (11/06/2024)   PRAPARE - Administrator, Civil Service (Medical): No    Lack of Transportation (Non-Medical): No  Physical Activity: Not on file  Stress: Not on file  Social Connections: Socially Integrated (11/06/2024)   Social Connection and Isolation Panel    Frequency of Communication with Friends and Family: More than three times a week    Frequency of Social Gatherings with Friends and Family: More than three times a week    Attends Religious Services: More than 4 times per year    Active Member of [employer] or Organizations: Yes    Attends Engineer, Structural: More than 4 times per year    Marital Status: Married     Family History: The patient's She was adopted. Family history is unknown by patient.  ROS:   Please see the history of present illness.     All other systems reviewed and are negative.  EKGs/Labs/Other Studies Reviewed:     The following studies were reviewed today: Cardiac cath 05/11/24:  LEFT HEART CATH AND CORONARY ANGIOGRAPHY   Conclusion      1st RPL lesion is 60% stenosed.   Ost LAD to Prox LAD lesion is 40% stenosed.   Ost LM lesion is 20% stenosed.   Non-stenotic Prox Cx to Mid Cx lesion was previously treated.   1.  Widely patent left circumflex stent with stable moderate proximal LAD stenosis.  No evidence of obstructive disease. 2.  Left ventricular angiography was not performed due to chronic kidney disease.  Mildly elevated left ventricular end-diastolic pressure at 20 mmHg.   Recommendations: Continue medical therapy for coronary artery disease. Continue management of atrial fibrillation. Heparin  drip can be resumed 2 hours after TR band removal and she can be transition back to apixaban  in the evening if no further plans for any other invasive procedures. Monitor renal function.  35 mL of contrast was used for the procedure.   Echo 06/02/24: IMPRESSIONS     1. There is an L wave in the mitral inflow pattern suggesting significant  diastolic dysfunction and increased filling pressures. Left ventricular  ejection fraction, by estimation, is 25 to 30%. The left ventricle has  severely decreased function. The  left ventricle demonstrates global hypokinesis. There is mild concentric  left ventricular hypertrophy. Left ventricular diastolic parameters are  consistent with Grade II diastolic dysfunction (pseudonormalization).   2. Right ventricular systolic function is mildly reduced. The right  ventricular size is normal. There is mildly elevated pulmonary artery  systolic pressure. The estimated right ventricular systolic pressure is  36.9 mmHg.   3. Left atrial size was moderately dilated.   4. The mitral valve is normal in structure. Mild mitral valve  regurgitation. No evidence of mitral stenosis.   5. The aortic valve is tricuspid. There is mild calcification of the  aortic valve.  Aortic valve regurgitation is not visualized. Aortic valve  sclerosis/calcification is present, without any evidence of aortic  stenosis.   Conclusion(s)/Recommendation(s): Severe global LV dysfunction new since  previous. Doppler parameters suggestive of high left-side filling  pressures.      Echo 09/11/24: IMPRESSIONS     1. Left ventricular ejection fraction, by estimation, is 50 to 55%. The  left ventricle has low normal function. The left ventricle has no regional  wall motion abnormalities. There is mild left ventricular hypertrophy.  Left ventricular diastolic  parameters are consistent with Grade II diastolic dysfunction  (pseudonormalization).   2. Right ventricular systolic function is normal. The right ventricular  size is normal. There is mildly elevated pulmonary artery systolic  pressure.   3. Left atrial size was moderately dilated.   4. The mitral valve is normal in structure. Trivial mitral valve  regurgitation. No evidence of mitral stenosis.   5. The aortic valve was not well visualized. Aortic valve regurgitation  is not visualized. No aortic stenosis is present.   6. The inferior vena cava is normal in size with greater than 50%  respiratory variability, suggesting right atrial pressure of 3 mmHg.   Comparison(s): Prior images reviewed side by side. Changes from prior  study are noted. EF improved compared to prior.    Recent Labs: 06/09/2024: Magnesium  2.2 07/21/2024: TSH 6.990 11/06/2024: ALT 16; B Natriuretic Peptide 330.2; Hemoglobin 11.9; Hemoglobin 11.6; Platelets 178 11/09/2024: BUN 37; Creatinine, Ser 2.10; Potassium 3.6; Sodium 129  Recent Lipid Panel    Component Value Date/Time   CHOL 157 05/03/2024 0914   TRIG 177 (H) 05/03/2024 0914   HDL 66 05/03/2024 0914   CHOLHDL 2.4 05/03/2024 0914   CHOLHDL 3.0 09/22/2014 0331   VLDL 24 09/22/2014 0331   LDLCALC 62 05/03/2024 0914        Risk Assessment/Calculations:    CHA2DS2-VASc Score = 7    This indicates a 11.2% annual risk of stroke. The patient's score is based upon: CHF History: 1 HTN History: 1 Diabetes History: 1 Stroke History: 0 Vascular Disease History: 1 Age Score: 2 Gender Score: 1               Physical Exam:    VS:  BP (!) 128/50 (BP Location: Right Arm, Patient Position: Sitting, Cuff Size: Large)   Pulse 70   Ht 5' 5 (1.651 m)   Wt 191 lb (86.6 kg)   SpO2 98%   BMI 31.78 kg/m     Wt Readings from Last 3 Encounters:  11/21/24 191 lb (86.6 kg)  11/06/24 190 lb (86.2 kg)  10/26/24 197 lb (89.4 kg)     GEN:  Well nourished,  in no acute distress HEENT: Normal NECK: No JVD; No carotid bruits LYMPHATICS: No lymphadenopathy CARDIAC: RRR, no murmurs, rubs, gallops RESPIRATORY:  Clear to auscultation without rales, wheezing or rhonchi  ABDOMEN: Soft, non-tender, non-distended MUSCULOSKELETAL:  trace pedal edema; No deformity  SKIN: Warm and dry NEUROLOGIC:  Alert and oriented x 3 PSYCHIATRIC:  Normal affect   ASSESSMENT:    1. Chronic systolic heart failure (HCC)   2. Coronary artery disease involving native coronary artery of native heart without angina pectoris   3. PAF (paroxysmal atrial fibrillation) (HCC)   4. Encounter for long-term (current) use of high-risk medication   5. Stage 3a chronic kidney disease (HCC)     PLAN:    In order of problems listed above:  Atrial fibrillation. Maintaining NSR on low dose amiodarone . On Eliquis .  Acute on chronic systolic CHF. EF 35% by PET CT- unclear if this was while she was in Afib or not. EF by Echo showed EF 25-30%. No blockage on cath. EF a year  ago was ok even in the presence of LBBB. Suspect more related to AFib with RVR. Repeat Echo in September showed normalization of EF. Continue  nitrates, Jardiance . Not a candidate for ACEi/ARB/ARNI or aldactone  due to CKD. Sodium restriction. CAD s/p stent of LCx in April 2024. No obstructive disease by cath. Off antiplatelet therapy due to  being on [facility] CKD stage IV. Followed by Nephrology History of hypothyroidism on replacement. Dose adjusted by PCP. HTN. BP is controlled.  DM off metformine due to CKD. On Jardiance .  CAP recent. Resolved.            Medication Adjustments/Labs and Tests Ordered: Current medicines are reviewed at length with the patient today.  Concerns regarding medicines are outlined above.  No orders of the defined types were placed in this encounter.  No orders of the defined types were placed in this encounter.   There are no Patient Instructions on file for this visit.   Signed, Videl Nobrega, MD  11/21/2024 8:22 AM    [facility]

## 2024-11-21 ENCOUNTER — Ambulatory Visit: Attending: Cardiology | Admitting: Cardiology

## 2024-11-21 ENCOUNTER — Encounter: Payer: Self-pay | Admitting: Cardiology

## 2024-11-21 VITALS — BP 128/50 | HR 70 | Ht 65.0 in | Wt 191.0 lb

## 2024-11-21 DIAGNOSIS — Z79899 Other long term (current) drug therapy: Secondary | ICD-10-CM

## 2024-11-21 DIAGNOSIS — I2510 Atherosclerotic heart disease of native coronary artery without angina pectoris: Secondary | ICD-10-CM

## 2024-11-21 DIAGNOSIS — I5022 Chronic systolic (congestive) heart failure: Secondary | ICD-10-CM | POA: Diagnosis not present

## 2024-11-21 DIAGNOSIS — N1831 Chronic kidney disease, stage 3a: Secondary | ICD-10-CM

## 2024-11-21 DIAGNOSIS — I480 Paroxysmal atrial fibrillation: Secondary | ICD-10-CM | POA: Diagnosis not present

## 2024-11-21 NOTE — Patient Instructions (Signed)
 Medication Instructions:  Continue same medications *If you need a refill on your cardiac medications before your next appointment, please call your pharmacy*  Lab Work: None ordered  Testing/Procedures: None ordered  Follow-Up: At [facility], you and your health needs are our priority.  As part of our continuing mission to provide you with exceptional heart care, our providers are all part of one team.  This team includes your primary Cardiologist (physician) and Advanced Practice Providers or APPs (Physician Assistants and Nurse Practitioners) who all work together to provide you with the care you need, when you need it.  Your next appointment:  6 months    Call in Feb to schedule May appointment     Provider:  Dr.Jordan   We recommend signing up for the patient portal called MyChart.  Sign up information is provided on this After Visit Summary.  MyChart is used to connect with patients for Virtual Visits (Telemedicine).  Patients are able to view lab/test results, encounter notes, upcoming appointments, etc.  Non-urgent messages can be sent to your provider as well.   To learn more about what you can do with MyChart, go to [email or web address].

## 2024-11-22 ENCOUNTER — Encounter (HOSPITAL_COMMUNITY)
Admission: RE | Admit: 2024-11-22 | Discharge: 2024-11-22 | Disposition: A | Discharge status: Home / Self Care | Source: Ambulatory Visit | Attending: Nephrology | Admitting: Nephrology

## 2024-11-22 VITALS — BP 156/57 | HR 53 | Temp 97.7°F | Resp 16

## 2024-11-22 DIAGNOSIS — D631 Anemia in chronic kidney disease: Secondary | ICD-10-CM | POA: Diagnosis present

## 2024-11-22 DIAGNOSIS — N189 Chronic kidney disease, unspecified: Secondary | ICD-10-CM | POA: Diagnosis not present

## 2024-11-22 MED ORDER — SODIUM CHLORIDE 0.9 % IV SOLN
510.0000 mg | Freq: Once | INTRAVENOUS | Status: AC
  Administered 2024-11-22: 510 mg via INTRAVENOUS
  Filled 2024-11-22: qty 510

## 2024-11-29 ENCOUNTER — Inpatient Hospital Stay (HOSPITAL_COMMUNITY)
Admission: RE | Admit: 2024-11-29 | Discharge: 2024-11-29 | Disposition: A | Source: Ambulatory Visit | Attending: Nephrology

## 2024-11-29 VITALS — BP 182/60 | HR 50 | Temp 97.5°F | Resp 16

## 2024-11-29 DIAGNOSIS — N184 Chronic kidney disease, stage 4 (severe): Secondary | ICD-10-CM | POA: Diagnosis present

## 2024-11-29 DIAGNOSIS — N189 Chronic kidney disease, unspecified: Secondary | ICD-10-CM | POA: Diagnosis present

## 2024-11-29 DIAGNOSIS — D631 Anemia in chronic kidney disease: Secondary | ICD-10-CM | POA: Diagnosis present

## 2024-11-29 MED ORDER — SODIUM CHLORIDE 0.9 % IV SOLN
510.0000 mg | Freq: Once | INTRAVENOUS | Status: AC
  Administered 2024-11-29: 510 mg via INTRAVENOUS
  Filled 2024-11-29: qty 510

## 2024-12-17 ENCOUNTER — Other Ambulatory Visit: Payer: Self-pay | Admitting: Physician Assistant

## 2025-01-04 ENCOUNTER — Encounter: Payer: Self-pay | Admitting: Cardiology

## 2025-01-04 NOTE — Telephone Encounter (Signed)
 Spoke to patient she stated she has been having increased swelling in both lower legs for the past 2 weeks.Weight stable.She does have sob.She only takes Lasix  20 mg daily if needed.She is afraid to take due to kidneys.Advised I will send message to Dr.Jordan for advice.Appointment scheduled with Josefa Beauvais NP 1/15 at 1:55 pm.

## 2025-01-05 NOTE — Telephone Encounter (Signed)
 Spoke to patient Dr.Jordan's advice given.Keep appointment with Josefa Beauvais NP 1/15 at 1:55 pm.

## 2025-01-10 NOTE — Progress Notes (Signed)
 " Cardiology Office Note   Date:  01/11/2025  ID:  CANDRA WEGNER, DOB 1946/06/30, MRN [id number] PCP: Nichole Senior, MD  [facility] Providers Cardiologist:  Peter Jordan, MD Cardiology APP:  Lelon Glendia DASEN, PA-C  Electrophysiologist:  OLE DASEN HOLTS, MD (Inactive)  Electrophysiology APP:  Leverne Charlies Helling, PA-C     History of Present Illness MERELIN HUMAN is a 79 y.o. female with history of PAF, CAD s/p PCI mid Cx 03/2023, HFrEF (EF 50-55% 08/2024), LBBB, hypertension, OSA does not use CPAP, CKD stage 4, T2DM, asthma, rheumatoid arthritis, and hypothyroidism.     Established care with Dr. Fernande 08/2014 during hospitalization in the setting of a-fib with RVR. She converted with IV diltiazem  and was started on anticoagulation. Stress test 08/2014 showed no ischemia or infarction. She remained consistent with follow up without any concerns.   Heart monitor 03/2021 showed no a-fib or sustained arrhythmia. She did have symptoms associated with short runs of artrial tachycardia and ectopy- atrial and ventricular, which occur rarely <1% of heart beats.   Coronary CTA 01/2023 in the setting of presyncope and chest discomfort showed hemodynamically significant stenosis in LAD and Lcx, three vessel calcium  noted with score of 457, which is 81st percentile for age/sex.   Heart catheterization 03/2023 results widely patent LM without significant stenosis, 40-50% proximal LAD stenosis, severe 80% mid Lcx treated successfully with 2.75 x 28 mm Synergy DES, and large dominant RCA with mild nonobstructive stenosis. DOAC with apixaban  and clopidogrel  for 3 months, then titration to aspirin .   PET stress 05/09/24 in the setting of DOE similar to that leading up to catheterization results; intermediate risk. Mild ischemia with decrease in LAD stress flows, there is small defect with mild reduction in uptake present in apical to mid anterior locations that is reversible, rest EF reduced at 35%. She  was found to be in a-fib with RVR during exam, and transferred to ED.   During hospitalization 04/2024 she underwent successful DCCV. She underwent cardiac catheterization with PET stress test results. Cardiac catheterization 04/2024 results; widely patent Lcx stent with stable moderate proximal LAD stenosis (40%), 1st RPL lesion 60% stenosis, and ostial LM 20% stenosis.  Echo 06/02/2024 LVEF 25-30%, LV global hypokinesis, mild LVH, grade II dd, RV systolic function mildly reduced, LA mildly dilated, mild MR, and mild aortic sclerosis. She was admitted 06/02/2024 in the setting of acute CHF, and developed a-fib with RVR and was placed on amiodarone . She converted to SR. Recommendation at discharge to take amiodarone  200 mg BID for 1 week, then 200 mg daily. Creatinine improved at discharge.   She was evaluated by Heart and Vascular TOC 05/2024 with recommendations of repeat echo in 3 months. If EF not improving despite maintenance of NSR, consider cMRI, and if remains <35% consider CRT device given LBBB.   Repeat cardiac monitor 05/2024 results; no a-fib detected, 22  nonsustained SVT (longest 17 beats), rare ventricular ectopy, intermittent bundle branch block, and symptom trigger episodes correspond to SR with occasional ectopy.  Latest echo 09/11/2024 LVEF 50-55%, no RWMA, mild LVH, grade II dd, RV normal, LA moderately dilated, and trivial MR.  Decreased Amiodarone  dose to 100 mg daily due to bradycardia.  Last seen in office 11/21/24 by Dr. Jordan and appeared to be doing well form a cardiac standpoint.    She presents today for bilateral lower leg swelling. She called the office 1/8 with bilateral LE edema and shortness of breath for 2 weeks. Weight stable.  She is nervous to take furosemide  PRN due to CKD. Dr. Jordan recommended she take lasix  20 mg daily. Today she is having increased fatigue, DOE, and shortness of breath. Since taking her furosemide  daily, she has lost three pounds. Her weight remains  stable at home. In office the scale shows an increase of 5 lb since 11/25. She wakes up a lot during the night and sleeps better in the chair, but also wakes up uncomfortable. She does not check her BP often at home. She denies chest pain, palpitations, melena, hematuria, hemoptysis, diaphoresis, weakness, presyncope, syncope, orthopnea, and PND.  ROS: All systems negative unless otherwise indicated in HPI.   Studies Reviewed EKG Interpretation Date/Time:  Thursday January 11 2025 14:03:46 EST Ventricular Rate:  60 PR Interval:  148 QRS Duration:  152 QT Interval:  510 QTC Calculation: 510 R Axis:   -37  Text Interpretation: Normal sinus rhythm Left axis deviation Left bundle branch block When compared with ECG of 06-Nov-2024 09:27, PREVIOUS ECG IS PRESENT Confirmed by Teresa Fish [phone]) on 01/11/2025 2:45:51 PM    Cardiac Studies & Procedures   ______________________________________________________________________________________________ CARDIAC CATHETERIZATION  CARDIAC CATHETERIZATION 05/11/2024  Conclusion   1st RPL lesion is 60% stenosed.   Ost LAD to Prox LAD lesion is 40% stenosed.   Ost LM lesion is 20% stenosed.   Non-stenotic Prox Cx to Mid Cx lesion was previously treated.  1.  Widely patent left circumflex stent with stable moderate proximal LAD stenosis.  No evidence of obstructive disease. 2.  Left ventricular angiography was not performed due to chronic kidney disease.  Mildly elevated left ventricular end-diastolic pressure at 20 mmHg.  Recommendations: Continue medical therapy for coronary artery disease. Continue management of atrial fibrillation. Heparin  drip can be resumed 2 hours after TR band removal and she can be transition back to apixaban  in the evening if no further plans for any other invasive procedures. Monitor renal function.  35 mL of contrast was used for the procedure.  Findings Coronary Findings Diagnostic  Dominance: Right  Left Main Ost  LM lesion is 20% stenosed.  Left Anterior Descending Ost LAD to Prox LAD lesion is 40% stenosed.  Left Circumflex Non-stenotic Prox Cx to Mid Cx lesion was previously treated.  Right Coronary Artery There is mild diffuse disease throughout the vessel.  First Right Posterolateral Branch Vessel is large in size. 1st RPL lesion is 60% stenosed.  Intervention  No interventions have been documented.   CARDIAC CATHETERIZATION  CARDIAC CATHETERIZATION 04/23/2023  Conclusion   Ost LAD to Prox LAD lesion is 50% stenosed.   Prox Cx to Mid Cx lesion is 80% stenosed.   1st RPL lesion is 60% stenosed.   A drug-eluting stent was successfully placed using a SYNERGY XD 2.75X28.   Post intervention, there is a 0% residual stenosis.  1.  Widely patent left main with no significant stenosis 2.  Patent LAD with 40 to 50% proximal stenosis, nonobstructive disease appropriate for medical therapy 3.  Severe mid circumflex stenosis treated successfully with a 2.75 x 28 mm Synergy DES 4.  Large, dominant RCA with mild nonobstructive plaquing and moderate focal stenosis in a large posterolateral branch 5.  Normal LVEDP  Recommendations: Resume apixaban  tomorrow.  Clopidogrel  600 mg administered orally on the table during the procedure.  Take clopidogrel  75 mg daily starting tomorrow for at least 3 months, then consider transition to aspirin  81 mg daily in the setting of chronic oral anticoagulation.  Same day DC if criteria  met  Findings Coronary Findings Diagnostic  Dominance: Right  Left Main The vessel exhibits minimal luminal irregularities.  Left Anterior Descending Ost LAD to Prox LAD lesion is 50% stenosed.  Left Circumflex Prox Cx to Mid Cx lesion is 80% stenosed.  Right Coronary Artery There is mild diffuse disease throughout the vessel.  First Right Posterolateral Branch Vessel is large in size. 1st RPL lesion is 60% stenosed.  Intervention  Prox Cx to Mid Cx  lesion Stent CATH LAUNCHER 72F EBU3.5 guide catheter was inserted. Lesion crossed with guidewire using a WIRE COUGAR XT STRL 190CM. Pre-stent angioplasty was performed. A drug-eluting stent was successfully placed using a SYNERGY XD 2.75X28. Post-stent angioplasty was performed using a BALLN [place] EMERGE MR 3.0X20. Post-Intervention Lesion Assessment The intervention was successful. Pre-interventional TIMI flow is 3. Post-intervention TIMI flow is 3. No complications occurred at this lesion. There is a 0% residual stenosis post intervention.   STRESS TESTS  NM PET CT CARDIAC PERFUSION MULTI W/ABSOLUTE BLOODFLOW 05/09/2024  Narrative   Findings are consistent with ischemia. The study is intermediate risk: Mild anterior ischemia with decrease in LAD stress flows.  LVEF is decreased but augments with stress and may be related to septal motion. Decrease iny myocardial blood flow reserve is partially underestimated due to high resting flows   LV perfusion is abnormal. There is evidence of ischemia. There is no evidence of infarction. Defect 1: There is a small defect with mild reduction in uptake present in the apical to mid anterior location(s) that is reversible. There is normal wall motion in the defect area. Consistent with ischemia.   Rest left ventricular function is abnormal. Rest global function is moderately reduced. Rest EF: 35%. Stress left ventricular function is abnormal. Stress global function is mildly reduced. Stress EF: 42%. End diastolic cavity size is mildly enlarged. End systolic cavity size is mildly enlarged.   Myocardial blood flow was computed to be 1.22ml/g/min at rest and 1.90ml/g/min at stress. Global myocardial blood flow reserve was 1.58 and was abnormal.   Coronary calcium  assessment not performed due to prior revascularization. Aortic atherosclerosis.  Aortic valve calcification.   Electronically Signed  By: Stanly Leavens M.D.  EXAM: OVER-READ INTERPRETATION  CT  CHEST  The following report is a limited chest CT over-read performed by radiologist Dr. Elsie Ko [facility] on 05/09/2024. This over-read does not include interpretation of cardiac or coronary anatomy or pathology nor does it include evaluation of the PET data. The cardiac PET-CT interpretation by the cardiologist is attached.  COMPARISON:  Cardiac CT 02/10/2023  FINDINGS: Mediastinum/Nodes: No enlarged lymph nodes within the visualized mediastinum.  Lungs/Pleura: There is no pleural effusion. Stable linear scarring in both lungs.  Upper abdomen: No significant findings in the visualized upper abdomen.  Musculoskeletal/Chest wall: No chest wall mass or suspicious osseous findings within the visualized chest. Thoracic spinal stimulator, previous right shoulder reverse arthroplasty and lower cervical fusion noted.  IMPRESSION: No significant extracardiac findings within the visualized chest.   Electronically Signed By: Elsie Perone M.D. On: 05/09/2024 10:19   ECHOCARDIOGRAM  ECHOCARDIOGRAM COMPLETE 09/11/2024  Narrative ECHOCARDIOGRAM REPORT    Patient Name:   SAMANTHAN DUGO Date of Exam: 09/11/2024 Medical Rec #:  [id number]        Height:       65.0 in Accession #:    [id number]       Weight:       196.2 lb Date of Birth:  [birth year]  BSA:          1.962 m Patient Age:    78 years         BP:           160/60 mmHg Patient Gender: F                HR:           59 bpm. Exam Location:  [address]  Procedure: 2D Echo, Cardiac Doppler and Color Doppler (Both Spectral and Color Flow Doppler were utilized during procedure).  Indications:    I48.91 Atrial fibrillation  History:        Patient has prior history of Echocardiogram examinations, most recent 06/02/2024. CAD, Arrythmias:Atrial Fibrillation and LBBB; Risk Factors:Hypertension and Obesity.  Sonographer:    Elsie Bohr RDCS Referring Phys: [phone] PETER M  JORDAN  IMPRESSIONS   1. Left ventricular ejection fraction, by estimation, is 50 to 55%. The left ventricle has low normal function. The left ventricle has no regional wall motion abnormalities. There is mild left ventricular hypertrophy. Left ventricular diastolic parameters are consistent with Grade II diastolic dysfunction (pseudonormalization). 2. Right ventricular systolic function is normal. The right ventricular size is normal. There is mildly elevated pulmonary artery systolic pressure. 3. Left atrial size was moderately dilated. 4. The mitral valve is normal in structure. Trivial mitral valve regurgitation. No evidence of mitral stenosis. 5. The aortic valve was not well visualized. Aortic valve regurgitation is not visualized. No aortic stenosis is present. 6. The inferior vena cava is normal in size with greater than 50% respiratory variability, suggesting right atrial pressure of 3 mmHg.  Comparison(s): Prior images reviewed side by side. Changes from prior study are noted. EF improved compared to prior.  FINDINGS Left Ventricle: Left ventricular ejection fraction, by estimation, is 50 to 55%. The left ventricle has low normal function. The left ventricle has no regional wall motion abnormalities. The left ventricular internal cavity size was normal in size. There is mild left ventricular hypertrophy. Left ventricular diastolic parameters are consistent with Grade II diastolic dysfunction (pseudonormalization).  Right Ventricle: The right ventricular size is normal. Right vetricular wall thickness was not well visualized. Right ventricular systolic function is normal. There is mildly elevated pulmonary artery systolic pressure. The tricuspid regurgitant velocity is 2.99 m/s, and with an assumed right atrial pressure of 3 mmHg, the estimated right ventricular systolic pressure is 38.8 mmHg.  Left Atrium: Left atrial size was moderately dilated.  Right Atrium: Right atrial size was  normal in size.  Pericardium: There is no evidence of pericardial effusion.  Mitral Valve: The mitral valve is normal in structure. Trivial mitral valve regurgitation. No evidence of mitral valve stenosis.  Tricuspid Valve: The tricuspid valve is grossly normal. Tricuspid valve regurgitation is trivial. No evidence of tricuspid stenosis.  Aortic Valve: The aortic valve was not well visualized. Aortic valve regurgitation is not visualized. No aortic stenosis is present.  Pulmonic Valve: The pulmonic valve was not well visualized. Pulmonic valve regurgitation is trivial. No evidence of pulmonic stenosis.  Aorta: The aortic root, ascending aorta, aortic arch and descending aorta are all structurally normal, with no evidence of dilitation or obstruction.  Venous: The inferior vena cava is normal in size with greater than 50% respiratory variability, suggesting right atrial pressure of 3 mmHg.  IAS/Shunts: The atrial septum is grossly normal.   LEFT VENTRICLE PLAX 2D LVIDd:         5.20 cm   Diastology LVIDs:  3.70 cm   LV e' medial:    4.90 cm/s LV PW:         1.20 cm   LV E/e' medial:  21.4 LV IVS:        1.10 cm   LV e' lateral:   7.18 cm/s LVOT diam:     1.90 cm   LV E/e' lateral: 14.6 LV SV:         71 LV SV Index:   36 LVOT Area:     2.84 cm   RIGHT VENTRICLE             IVC RV Basal diam:  3.28 cm     IVC diam: 1.70 cm RV S prime:     13.65 cm/s TAPSE (M-mode): 2.3 cm      PULMONARY VEINS RVSP:           38.8 mmHg   Diastolic Velocity: 64.70 cm/s S/D Velocity:       1.30 Systolic Velocity:  81.10 cm/s  LEFT ATRIUM             Index        RIGHT ATRIUM           Index LA diam:        4.10 cm 2.09 cm/m   RA Pressure: 3.00 mmHg LA Vol (A2C):   80.8 ml 41.18 ml/m  RA Area:     14.15 cm LA Vol (A4C):   59.4 ml 30.27 ml/m  RA Volume:   36.35 ml  18.53 ml/m LA Biplane Vol: 70.8 ml 36.08 ml/m AORTIC VALVE LVOT Vmax:   89.40 cm/s LVOT Vmean:  63.300 cm/s LVOT  VTI:    0.252 m  AORTA Ao Root diam: 3.00 cm Ao Asc diam:  3.20 cm  MITRAL VALVE                TRICUSPID VALVE MV Area (PHT): 3.81 cm     TR Peak grad:   35.8 mmHg MV Decel Time: 199 msec     TR Vmax:        299.00 cm/s MV E velocity: 105.00 cm/s  Estimated RAP:  3.00 mmHg MV A velocity: 111.00 cm/s  RVSP:           38.8 mmHg MV E/A ratio:  0.95 SHUNTS Systemic VTI:  0.25 m Systemic Diam: 1.90 cm  Shelda Bruckner MD Electronically signed by Shelda Bruckner MD Signature Date/Time: 09/11/2024/2:10:12 PM    Final    MONITORS  LONG TERM MONITOR (3-14 DAYS) 06/13/2024  Narrative Patch Wear Time:  13 days and 23 hours (2025-05-20T12:22:29-0400 to 2025-06-03T12:09:25-0400)  HR 38 - 158, average 57 bpm. 22 nonsustained SVT (longest 17 beats). No atrial fibrillation detected. Rare supraventricular ectopy. Rare ventricular ectopy. No sustained arrhythmias. Symptom trigger episodes correspond to predominantly sinus rhythm, occasionally with ectopy. Intermittent bundle branch block.  Fonda Kitty Cardiac Electrophysiology   CT SCANS  CT CORONARY MORPH W/CTA COR W/SCORE 02/10/2023  Addendum 02/13/2023 10:37 AM ADDENDUM REPORT: 02/13/2023 10:35  ADDENDUM: OVER-READ INTERPRETATION  CT CHEST  The following report is an over-read performed by radiologist Dr. CHARM Toribio Faes III MD of [facility] on Creation date. This over-read does not include interpretation of cardiac or coronary anatomy or pathology. The CTA interpretation by the cardiologist is attached.  Blood pool is hypodense compared to the interventricular septum suggesting anemia. No mediastinal mass or adenopathy. No pleural effusion. No pneumothorax.  Right shoulder arthroplasty. Paired dorsal stimulator catheters.  Lumbar fusion hardware. Cervical fixation hardware. Cholecystectomy clips.  IMPRESSION: No acute ancillary findings   Electronically Signed By: JONETTA Faes  M.D. On: 02/13/2023 10:35  Narrative CLINICAL DATA:  Chest pain  EXAM: Cardiac CTA  MEDICATIONS: Sub lingual nitro. 4mg  and lopressor  100mg   TECHNIQUE: The patient was scanned on a Siemens Force 192 slice scanner. Gantry rotation speed was 250 msecs. Collimation was .6 mm. A 100 kV prospective scan was triggered in the ascending thoracic aorta at 140 HU's Full mA was used between 35% and 75% of the R-R interval. Average HR during the scan was 62 bpm. The 3D data set was interpreted on a dedicated work station using MPR, MIP and VRT modes. A total of 80cc of contrast was used.  FINDINGS: Non-cardiac: See separate report from [facility]. No significant findings on limited lung and soft tissue windows.  Calcium  Score: 3 vessel calcium  noted  LM 27  LAD 210  LCX 145  RCA 74.8  Total 457 which is 81 st percentile for age/sex  Coronary Arteries: Right dominant with no anomalies  LM: 1-24% ostial stenosis  LAD: > 70% long area of calcified plaque in proximal vessel extending near ostium of D1  D1: Normal  D2: Normal  Circumflex: 50-69% % calcified plaque in proximal and mid vessel  OM1: 50-69% % mixed plaque in proximal vessel  OM2: Normal  RCA: 1-24% mixed plaque in ostium  PDA: Normal  PLA: Normal  IMPRESSION: 1. 3 vessel calcium  noted with score 457 which is 81 st percentile for age/sex  2.  Normal ascending thoracic aorta 3.4 cm  3. CAD RADS 4 obstructive disease in proximal LAD Study sent for [facility]  Maude Emmer  Electronically Signed: By: Maude Emmer M.D. On: 02/10/2023 10:38     ______________________________________________________________________________________________      Risk Assessment/Calculations  CHA2DS2-VASc Score = 7   This indicates a 11.2% annual risk of stroke. The patient's score is based upon: CHF History: 1 HTN History: 1 Diabetes History: 1 Stroke History: 0 Vascular Disease History: 1 Age Score:  2 Gender Score: 1            Physical Exam VS:  BP (!) 132/58 (BP Location: Left Arm, Patient Position: Sitting, Cuff Size: Large)   Pulse 60   Ht 5' 5.5 (1.664 m)   Wt 196 lb 9.6 oz (89.2 kg)   SpO2 96%   BMI 32.22 kg/m        Wt Readings from Last 3 Encounters:  01/11/25 196 lb 9.6 oz (89.2 kg)  11/21/24 191 lb (86.6 kg)  11/06/24 190 lb (86.2 kg)    GEN: Well nourished, well developed in no acute distress NECK: No JVD; No carotid bruits CARDIAC: RRR, no murmurs, rubs, gallops RESPIRATORY:  Clear to auscultation without rales, wheezing or rhonchi  ABDOMEN: Soft, non-tender, non-distended EXTREMITIES:  +2 bilateral ankle edema; No deformity   ASSESSMENT AND PLAN  HFrEF- Echo 09/11/2024 LVEF 50-55%, no RWMA, mild LVH, grade II dd, RV normal, LA moderately dilated, and trivial MR.  Today she notes increased fatigue, DOE, and LE edema. Weight at home remains stable at 188-191 lb. NYHA class II.  Recommend weighing daily and keeping a log. Please call our office if you have weight gain of 2 pounds overnight or 5 pounds in 1 week.  GDMT-  furosemide  40 mg for three days and then 20 mg daily, Jardiance , Imdur  60 mg, and hydralazine  TID.  BMP today and repeat in one week.  No ARNi/MRA given CKD (baseline creatinine ~2.0)  CAD s/p PCI/HDL- Last LDL 62 05/03/24. Cardiac catheterization 04/2024 results; widely patent Lcx stent (03/2023) with stable moderate proximal LAD stenosis (40%), 1st RPL lesion 60% stenosis, and ostial LM 20% stenosis. Today she denies CP, palpitations, and syncope. Continue rosuvastatin  5 mg, Imdur  60 mg, and Eliquis  5 mg BID.   PAF- 04/2024 she underwent successful DCCV. Reduced amiodarone  dose to 100 mg daily on 10/2024. EKG today SR with LBBB.  Continue amiodarone  100 mg and Eliquis  5 mg BID.  CHA2DS2-VASc Score = 7 [CHF History: 1, HTN History: 1, Diabetes History: 1, Stroke History: 0, Vascular Disease History: 1, Age Score: 2, Gender Score: 1].  Therefore,  the patient's annual risk of stroke is 11.2 %.     LBBB- EKG as above.   Hypertension- BP today 132/58. She does not check her BP at home.  Continue amlodipine  5 mg, hydralazine  50 mg TID, and Imdur  60 mg.   CKD stage 4- Follows with nephrology. She follows up with Nephrology mid February. Will ask for their guidance on furosemide .   T2DM- Last A1C was 6.0 on 11/14/24. Well controlled. Continue to follow with PCP.  OSA- Is not compliant with CPAP. Deferred sleep study today.        Dispo: Follow up with APP in 8 weeks.   Signed, Mardy KATHEE Pizza, FNP  "

## 2025-01-11 ENCOUNTER — Ambulatory Visit: Attending: General Practice

## 2025-01-11 VITALS — BP 132/58 | HR 60 | Ht 65.5 in | Wt 196.6 lb

## 2025-01-11 DIAGNOSIS — I480 Paroxysmal atrial fibrillation: Secondary | ICD-10-CM

## 2025-01-11 DIAGNOSIS — I2510 Atherosclerotic heart disease of native coronary artery without angina pectoris: Secondary | ICD-10-CM

## 2025-01-11 DIAGNOSIS — E119 Type 2 diabetes mellitus without complications: Secondary | ICD-10-CM

## 2025-01-11 DIAGNOSIS — I5020 Unspecified systolic (congestive) heart failure: Secondary | ICD-10-CM | POA: Diagnosis not present

## 2025-01-11 DIAGNOSIS — I447 Left bundle-branch block, unspecified: Secondary | ICD-10-CM

## 2025-01-11 DIAGNOSIS — N184 Chronic kidney disease, stage 4 (severe): Secondary | ICD-10-CM

## 2025-01-11 DIAGNOSIS — I2583 Coronary atherosclerosis due to lipid rich plaque: Secondary | ICD-10-CM | POA: Diagnosis not present

## 2025-01-11 DIAGNOSIS — Z79899 Other long term (current) drug therapy: Secondary | ICD-10-CM | POA: Diagnosis not present

## 2025-01-11 DIAGNOSIS — I10 Essential (primary) hypertension: Secondary | ICD-10-CM | POA: Diagnosis not present

## 2025-01-11 DIAGNOSIS — G4733 Obstructive sleep apnea (adult) (pediatric): Secondary | ICD-10-CM | POA: Diagnosis not present

## 2025-01-11 MED ORDER — FUROSEMIDE 20 MG PO TABS: 20.0000 mg | ORAL_TABLET | Freq: Every day | ORAL | 3 refills | Status: AC

## 2025-01-11 NOTE — Patient Instructions (Addendum)
 Medication Instructions:  TAKE LASIX  40 MG DAILY FOR 3 DAYS THEN 20 MG DAILY AFTERWARDS *If you need a refill on your cardiac medications before your next appointment, please call your pharmacy*  Lab Work: BMP TODAY  BMP IN 1 WEEK If you have labs (blood work) drawn today and your tests are completely normal, you will receive your results only by: MyChart Message (if you have MyChart) OR A paper copy in the mail If you have any lab test that is abnormal or we need to change your treatment, we will call you to review the results.  Testing/Procedures: NO TESTING  Follow-Up: At [facility], you and your health needs are our priority.  As part of our continuing mission to provide you with exceptional heart care, our providers are all part of one team.  This team includes your primary Cardiologist (physician) and Advanced Practice Providers or APPs (Physician Assistants and Nurse Practitioners) who all work together to provide you with the care you need, when you need it.  Your next appointment:   8 week(s)  Provider:   Peter Jordan, MD or Josefa Beauvais, NP   Other Instructions KEEP LOG OF DAILY WEIGHT AND BRING READINGS TO NEXT OFFICE VISIT

## 2025-01-12 ENCOUNTER — Ambulatory Visit: Payer: Self-pay

## 2025-01-12 DIAGNOSIS — I2510 Atherosclerotic heart disease of native coronary artery without angina pectoris: Secondary | ICD-10-CM

## 2025-01-12 DIAGNOSIS — N184 Chronic kidney disease, stage 4 (severe): Secondary | ICD-10-CM

## 2025-01-12 DIAGNOSIS — I10 Essential (primary) hypertension: Secondary | ICD-10-CM

## 2025-01-12 LAB — BASIC METABOLIC PANEL WITH GFR
BUN/Creatinine Ratio: 17 (ref 12–28)
BUN: 39 mg/dL — ABNORMAL HIGH (ref 8–27)
CO2: 23 mmol/L (ref 20–29)
Calcium: 9.3 mg/dL (ref 8.7–10.3)
Chloride: 101 mmol/L (ref 96–106)
Creatinine, Ser: 2.26 mg/dL — ABNORMAL HIGH (ref 0.57–1.00)
Glucose: 183 mg/dL — ABNORMAL HIGH (ref 70–99)
Potassium: 3.4 mmol/L — ABNORMAL LOW (ref 3.5–5.2)
Sodium: 142 mmol/L (ref 134–144)
eGFR: 21 mL/min/1.73 — ABNORMAL LOW

## 2025-01-12 MED ORDER — POTASSIUM CHLORIDE CRYS ER 10 MEQ PO TBCR: 10.0000 meq | EXTENDED_RELEASE_TABLET | Freq: Every day | ORAL | 3 refills | Status: AC

## 2025-01-18 LAB — BASIC METABOLIC PANEL WITH GFR
BUN/Creatinine Ratio: 17 (ref 12–28)
BUN: 37 mg/dL — ABNORMAL HIGH (ref 8–27)
CO2: 22 mmol/L (ref 20–29)
Calcium: 9.2 mg/dL (ref 8.7–10.3)
Chloride: 99 mmol/L (ref 96–106)
Creatinine, Ser: 2.14 mg/dL — ABNORMAL HIGH (ref 0.57–1.00)
Glucose: 164 mg/dL — ABNORMAL HIGH (ref 70–99)
Potassium: 3.6 mmol/L (ref 3.5–5.2)
Sodium: 138 mmol/L (ref 134–144)
eGFR: 23 mL/min/1.73 — ABNORMAL LOW

## 2025-03-05 ENCOUNTER — Ambulatory Visit: Admitting: General Practice
